# Patient Record
Sex: Male | Born: 1965
Health system: Southern US, Community
[De-identification: ages and names within clinical notes are randomized; demographics above are authoritative.]

## PROBLEM LIST (undated history)

## (undated) DIAGNOSIS — E119 Type 2 diabetes mellitus without complications: Secondary | ICD-10-CM

## (undated) DIAGNOSIS — G43909 Migraine, unspecified, not intractable, without status migrainosus: Secondary | ICD-10-CM

## (undated) DIAGNOSIS — E1142 Type 2 diabetes mellitus with diabetic polyneuropathy: Secondary | ICD-10-CM

## (undated) DIAGNOSIS — I509 Heart failure, unspecified: Secondary | ICD-10-CM

---

## 2008-01-11 ENCOUNTER — Ambulatory Visit: Payer: Self-pay | Admitting: Internal Medicine

## 2008-01-12 ENCOUNTER — Inpatient Hospital Stay (HOSPITAL_COMMUNITY): Admission: EM | Admit: 2008-01-12 | Discharge: 2008-01-14 | Payer: Self-pay | Admitting: Emergency Medicine

## 2008-01-12 ENCOUNTER — Encounter (INDEPENDENT_AMBULATORY_CARE_PROVIDER_SITE_OTHER): Payer: Self-pay | Admitting: *Deleted

## 2008-01-25 ENCOUNTER — Emergency Department (HOSPITAL_COMMUNITY): Admission: EM | Admit: 2008-01-25 | Discharge: 2008-01-25 | Payer: Self-pay | Admitting: Emergency Medicine

## 2010-11-06 NOTE — Consult Note (Signed)
NAME:  Rickey Smith, Rickey Smith NO.:  [id number]   MEDICAL RECORD NO.:  [id number]          PATIENT TYPE:  INP   LOCATION:  2626                         FACILITY:  [facility]   PHYSICIAN:  Wendi Snipes, MD DATE OF BIRTH:  1966/04/17   DATE OF CONSULTATION:  DATE OF DISCHARGE:                                 CONSULTATION   REQUESTING PHYSICIAN:  Dr. Flonnie Overman concerning pericardial effusion.   CHIEF COMPLAINT:  Chest pain and shortness of breath.   HISTORY OF PRESENT ILLNESS:  Mr. Rickey Smith is a 45 year old white male with  a past medical history significant for migraine headaches, type 1  diabetes and recently seen as an outpatient and diagnosed with pleurisy.  He presents to the ED with several days of progressive shortness of  breath and pleuritic chest pain.  The patient states that he has been in  his usual state of health prior to two weeks ago and reports feeling  some pleuritic chest pain that has been slowly progressive over the last  few weeks.  He states that he has never had chest pain like this before,  and it has progressed to restricting much of his activity.  He does  admit that he has felt febrile at times, however, no evidence of  systemic illness such as abdominal pain, nausea, vomiting or diarrhea.  The patient states that he has had a nonproductive cough during this  time as well.  In the ED,  the patient was found to have an elevated D-  dimer and was subsequently sent for a CT scan, which showed bilateral  lower lobe opacities with small effusions that were suspicious for  pneumonia and moderate to large pericardial effusion with possibly a  restrictive component.  The patient was subsequently admitted for  evaluation and management of probable pneumonia or pericardial effusion.   PAST MEDICAL HISTORY:  1. Diabetes.  2. Migraine headaches.   MEDICATIONS ON ADMISSION:  Home medications include subcu insulin.  He  is currently on:  1. Azithromycin 500  mg IV.  2. Ceftriaxone IV.  3. NovoLog 11 units subcu every 4 hours.  4. Solu-Medrol 80 mg IV q.6.  5. Tamiflu 75 mg twice daily.  6. Pantoprazole 40 mg daily.  7. Zosyn 3.375 gm IV q.8.  8. Vancomycin 1500 mg IV q.12 h.  9. Nebulizers.  10.Oxycodone 5 mg every 4 hours as needed for pain.   ALLERGIES:  NO KNOWN DRUG ALLERGIES.   SOCIAL HISTORY:  The patient is a smoker.  He has used marijuana within  the last year.  He denies any IV drug use.   FAMILY HISTORY:  Reviewed and noncontributory.   REVIEW OF SYSTEMS:  All 14 systems were reviewed and were negative  except as mentioned in the HPI.  He has had no sick contacts.   PHYSICAL EXAMINATION:  VITAL SIGNS:  His blood pressure is 101/62.  His  heart rate is 105.  He is breathing 24 times a minute.  Temperature is  97.7.  Satting 96% on 2 liters nasal cannula.  GENERAL:  He is a 45 year old white  male appearing stated age in mild  amount of acute distress.  HEENT:  Moist mucous membranes.  Pupils  equal, round and reactive to light and accommodation.  Anicteric  sclerae.  NECK:  Jugulovenous distention to the angle of the mandible.  No  thyromegaly.  CHEST:  Bilateral lower lobe crackles.  HEART:  Tachycardiac.  Distant heart sounds.  No murmurs, rubs or  gallops.  ABDOMEN:  Nontender, nondistended.  Positive bowel sounds.  EXTREMITIES:  No clubbing, cyanosis or edema.  Pulses 2+ throughout.  Some evidence of __________.  NEURO:  Cranial nerves II-XII are grossly intact.  Alert and oriented  x3.  No focal neurologic deficit.  SKIN:  Multiple tattoos on bilateral  upper extremities and his chest.  PSYCH:  Mood and affect are appropriate.   EKG shows sinus tachycardia with diffuse ST and T-wave abnormalities  with PR depression and PR elevation in AVR, suggestive of pericarditis.   LABORATORY DATA:  His troponins are undetectable.  His sed rate is 71.  D-dimer is 2.3.  His white count is 13.3, hematocrit 34, platelets 191.   Chest CT shows:  1. Bilateral lower lobe opacities, likely representing pneumonia.  2. Moderate to large pericardial effusion, possibly restrictive      component.   ASSESSMENT/PLAN:  Likely pericarditis with a pericardial effusion.  Check echocardiogram as the patient is displaying physical signs of  hemodynamic compromise.  This likely has been slowly progressive over  the past few days to weeks given the size; however, cannot be completely  accurate on the size of the effusion until a transthoracic  echocardiogram is performed.  EKG findings also support pericarditis.  In context of his other symptoms, including his pulmonary infection, it  is likely a viral pericarditis.  Recommend a non-steroidal anti-inflammatory drug regimen for pain  control, either ibuprofen 800 mg three times daily or colchicine at this  time.  The patient is currently on Solu-Medrol, and that should suffice  for the acute period.  However, he will need non-steroidal anti-  inflammatory drugs (colchicine) for the prevention of recurrent  pericarditis prior to discharge.  Will discuss with the consult team  about possible pericardiocentesis in the event that his hemodynamics  become less stable.  Otherwise, supportive care in treating possible  infection and continuing to work up for other etiologies, including as  cause for thoracic inflammation, including malignancy and other  infection.      Wendi Snipes, MD  Electronically Signed     BHH/MEDQ  D:  01/12/2008  T:  01/12/2008  Job:  [id number]

## 2010-11-06 NOTE — H&P (Signed)
NAME:  Rickey Smith, Rickey Smith NO.:  [id number]   MEDICAL RECORD NO.:  [id number]          PATIENT TYPE:  EMS   LOCATION:  MAJO                         FACILITY:  [facility]   PHYSICIAN:  Lucita Ferrara, MD         DATE OF BIRTH:  07-21-1965   DATE OF ADMISSION:  01/11/2008  DATE OF DISCHARGE:                              HISTORY & PHYSICAL   PRIMARY CARE DOCTOR:  Dr. Bayard Beaver. Spear.   CHIEF COMPLAINT:  Chest pain, shortness of breath, fevers and chills.   HISTORY OF PRESENT ILLNESS:  The patient is a 45 year old male who  presents to [facility] with a chief complaint of chest  pain.  The symptoms began 3 days ago after the patient was diagnosed  with pleuritis with his primary care doctor.  He was empirically put on  antibiotics as an outpatient with little relief.  The patient's symptoms  became progressively worse and the patient presented here.  He also has  some shortness of breath that is worse upon exertion.  In addition to  the above symptoms, he has diffuse myalgias located in his upper neck  and chest area.  He has just been feeling very sick.  Here in the  emergency room a full workup was initiated, and it was found that the  patient had bilateral pneumonia and a large pleural effusion and  possible pericarditis.  The patient is nauseated, however, he has no  vomiting.  He also denies any diarrhea.  He denies recent travel or sick  contacts.   PAST MEDICAL HISTORY:  1. Diabetes type 1.  2. Migraine headache.  3. Pleurisy which was diagnosed recently.   SOCIAL HISTORY:  Currently smoking half-pack per day.  Also uses  cannabis, but denies any alcohol.   ALLERGIES:  NO KNOWN DRUG ALLERGIES.   MEDICATIONS AT HOME:  1. Ibuprofen 800 mg as needed.  2. Vicodin 5/500 as needed.  3. Avelox 400 mg p.o. daily.  4. NovoLog four times a day.   EKG shows ST-T wave changes, consistent with pericarditis, rate 100.   LABORATORY DATA:  ESR at 71.  Cardiac  markers; a myoglobin of 47,  troponin negative, D-dimer found to be 2.38.   RADIOLOGICAL DATA:  CT angio shows bilateral lower lobe lung opacity  with small effusions, most likely representing pneumonia.  A moderate to  large pericardial effusion, possibly restrictive in component.  No  evidence of acute pulmonary embolism.  No thoracic aortic abnormalities.  Somewhat prominent mediastinal nodes.  Consider follow up.  I guess it  stated that malignancy could not be ruled out.  Chest x-ray showed no  active cardiopulmonary disease.  There is mild cardiomegaly.   ASSESSMENT/PLAN:  A 45 year old with;  1. Chest pain/shortness of breath/pleuritis.  2. Fever/chills/diffuse myalgias.  3. Bilateral pneumonia per CT angiogram.  4. Tobaccoism.  5. Large pericardial effusion.  6. Likely pericarditis.   DISCUSSION AND PLAN:  It does look like the patient likely has  pericardial involvement, although I am not entirely sure of his  restrictive component of his  pericardial effusion.  It is certainly  worrisome if this is the case.  I did speak to cardiology on-call, who  will come for further recommendations.  IV fluids to be given.  Will  initiate sepsis protocol given his bilateral pneumonia.  We will  initiate treatment with vancomycin/Zosyn/Avelox.  Panculture if not done  in the emergency room.  Will cycle his cardiac enzymes.  H1N1 droplet  precautions and protocol.  Will empirically treat him with Tamiflu and  also initiate Solu-Medrol x24 hours.  Continue with hemodynamically  monitoring and involvement of critical care if the patient undergoes  hemodynamic or ventilatory compromise.      Lucita Ferrara, MD  Electronically Signed     RR/MEDQ  D:  01/12/2008  T:  01/12/2008  Job:  [id number]

## 2010-11-06 NOTE — Discharge Summary (Signed)
NAME:  Rickey Smith, Rickey Smith NO.:  [id number]   MEDICAL RECORD NO.:  [id number]          PATIENT TYPE:  INP   LOCATION:  4711                         FACILITY:  [facility]   PHYSICIAN:  Lonia Blood, M.D.       DATE OF BIRTH:  October 14, 1965   DATE OF ADMISSION:  01/11/2008  DATE OF DISCHARGE:  01/14/2008                               DISCHARGE SUMMARY   PRIMARY CARE PHYSICIAN:  Tammy R. Collins Scotland, MD.   DISCHARGE DIAGNOSES:  1. Pleuritis and pericarditis - most likely viral - improving.  2. Diabetes mellitus type 1.  3. Migraine headaches.   DISCHARGE MEDICATIONS:  1. Ibuprofen 500 mg by mouth three times a day.  2. Protonix 40 mg daily.  3. Doxycycline 100 mg twice a day.   CONDITION ON DISCHARGE:  [name] is discharged in great condition.  He is alert and oriented with stable vital signs.  He is afebrile and  without any chest pain.  He is instructed to follow up with Dr. Herb Grays to have a transthoracic echocardiogram arranged at [facility] to assure resolution of his pericardial effusion.   PROCEDURES IN THIS ADMISSION:  1. The patient underwent CT scan of his chest with findings of      bilateral lower lobe infiltrates, pericardial effusion.  No      pulmonary emboli.  2. On January 12, 2008, transthoracic echocardiogram with findings of      small pericardial effusion without evidence of hemodynamic      compromise - read by Dr. Eden Emms   CONSULTATION IN THIS ADMISSION:  The patient was seen by Dr. Eden Emms from  Cardiology.   HISTORY AND PHYSICAL:  Refer to dictated H and P which was done by Dr.  Flonnie Overman on January 12, 2008.   HOSPITAL COURSE:  Pleuritis, pericarditis, chest pain, and pneumonia.  [name] was admitted from home with a viral-type syndrome that was  complicated by pleuritis and pericarditis.  The patient was treated with  high-dose steroids and nonsteroidal anti-inflammatory drugs, and he was  placed on empiric intravenous antibiotics.   His course was one of a  rapid recovery, and by hospital day #3, [name] reported that his  chest pain was gone.  He  requested discharge home.  The patient was transitioned home on  nonsteroidal anti-inflammatory drugs and an oral antibiotic and he was  told to follow up with his primary care physician for a repeat  echocardiogram to assure resolution of his pericardial effusion.      Lonia Blood, M.D.  Electronically Signed     SL/MEDQ  D:  01/16/2008  T:  01/16/2008  Job:  [id number]   cc:   Tammy R. Collins Scotland, M.D.

## 2011-03-22 LAB — POCT I-STAT, CHEM 8
BUN: 17
BUN: 15
Calcium, Ion: 0.94 — ABNORMAL LOW
Chloride: 108
Chloride: 107
Creatinine, Ser: 1
Glucose, Bld: 230 — ABNORMAL HIGH
Potassium: 4.2
Sodium: 139
TCO2: 21

## 2011-03-22 LAB — CK TOTAL AND CKMB (NOT AT ARMC)
CK, MB: 0.3
CK, MB: 0.5
Relative Index: INVALID
Relative Index: INVALID
Total CK: 15

## 2011-03-22 LAB — CBC
HCT: 34.8 — ABNORMAL LOW
HCT: 39
Hemoglobin: 10.8 — ABNORMAL LOW
MCHC: 33.9
MCV: 87.6
MCV: 87.7
Platelets: 173
Platelets: 191
Platelets: 262
RBC: 3.63 — ABNORMAL LOW
RDW: 12.8
WBC: 10.3
WBC: 13.3 — ABNORMAL HIGH
WBC: 13.6 — ABNORMAL HIGH
WBC: 22.6 — ABNORMAL HIGH

## 2011-03-22 LAB — CULTURE, BLOOD (ROUTINE X 2)

## 2011-03-22 LAB — URINALYSIS, ROUTINE W REFLEX MICROSCOPIC
Leukocytes, UA: NEGATIVE
Nitrite: NEGATIVE
Specific Gravity, Urine: >1.046 — ABNORMAL HIGH
pH: 5.5

## 2011-03-22 LAB — COMPREHENSIVE METABOLIC PANEL
ALT: 15
AST: 12
CO2: 23
CO2: 25
Calcium: 7.9 — ABNORMAL LOW
Calcium: 7.9 — ABNORMAL LOW
Chloride: 104
Creatinine, Ser: 0.69
Creatinine, Ser: 0.7
GFR calc Af Amer: >60
GFR calc non Af Amer: >60
GFR calc non Af Amer: >60
Glucose, Bld: 230 — ABNORMAL HIGH
Glucose, Bld: 283 — ABNORMAL HIGH
Sodium: 136
Total Bilirubin: 0.5

## 2011-03-22 LAB — DIFFERENTIAL
Basophils Absolute: 0.1
Basophils Absolute: 0
Eosinophils Absolute: 0.1
Eosinophils Relative: 1
Eosinophils Relative: 1
Lymphocytes Relative: 5 — ABNORMAL LOW
Lymphocytes Relative: 6 — ABNORMAL LOW
Lymphs Abs: 1.4
Neutro Abs: 19.5 — ABNORMAL HIGH
Neutrophils Relative %: 79 — ABNORMAL HIGH

## 2011-03-22 LAB — HEMOGLOBIN A1C
Hgb A1c MFr Bld: 9.3 — ABNORMAL HIGH
Mean Plasma Glucose: 254

## 2011-03-22 LAB — SEDIMENTATION RATE: Sed Rate: 71 — ABNORMAL HIGH

## 2011-03-22 LAB — URINE MICROSCOPIC-ADD ON

## 2011-03-22 LAB — POCT CARDIAC MARKERS
CKMB, poc: <1 — ABNORMAL LOW
CKMB, poc: <1 — ABNORMAL LOW
Myoglobin, poc: 58.8
Operator id: 277751
Troponin i, poc: <0.05
Troponin i, poc: <0.05

## 2011-03-22 LAB — URINE CULTURE: Culture: NO GROWTH

## 2011-03-22 LAB — B-NATRIURETIC PEPTIDE (CONVERTED LAB): Pro B Natriuretic peptide (BNP): 110 — ABNORMAL HIGH

## 2011-03-22 LAB — TROPONIN I: Troponin I: <0.01

## 2011-03-22 LAB — D-DIMER, QUANTITATIVE: D-Dimer, Quant: 2.38 — ABNORMAL HIGH

## 2012-05-20 ENCOUNTER — Emergency Department (HOSPITAL_BASED_OUTPATIENT_CLINIC_OR_DEPARTMENT_OTHER): Payer: No Typology Code available for payment source

## 2012-05-20 ENCOUNTER — Emergency Department (HOSPITAL_BASED_OUTPATIENT_CLINIC_OR_DEPARTMENT_OTHER)
Admission: EM | Admit: 2012-05-20 | Discharge: 2012-05-20 | Disposition: A | Discharge status: Home / Self Care | Payer: No Typology Code available for payment source | Attending: Emergency Medicine | Admitting: Emergency Medicine

## 2012-05-20 ENCOUNTER — Encounter (HOSPITAL_BASED_OUTPATIENT_CLINIC_OR_DEPARTMENT_OTHER): Payer: Self-pay | Admitting: *Deleted

## 2012-05-20 DIAGNOSIS — S161XXA Strain of muscle, fascia and tendon at neck level, initial encounter: Secondary | ICD-10-CM

## 2012-05-20 DIAGNOSIS — S139XXA Sprain of joints and ligaments of unspecified parts of neck, initial encounter: Secondary | ICD-10-CM | POA: Insufficient documentation

## 2012-05-20 DIAGNOSIS — S239XXA Sprain of unspecified parts of thorax, initial encounter: Secondary | ICD-10-CM | POA: Insufficient documentation

## 2012-05-20 DIAGNOSIS — S29019A Strain of muscle and tendon of unspecified wall of thorax, initial encounter: Secondary | ICD-10-CM

## 2012-05-20 DIAGNOSIS — Y939 Activity, unspecified: Secondary | ICD-10-CM | POA: Insufficient documentation

## 2012-05-20 DIAGNOSIS — F17200 Nicotine dependence, unspecified, uncomplicated: Secondary | ICD-10-CM | POA: Insufficient documentation

## 2012-05-20 MED ORDER — CYCLOBENZAPRINE HCL 10 MG PO TABS: 10.0000 mg | ORAL_TABLET | Freq: Three times a day (TID) | ORAL | Status: DC | PRN

## 2012-05-20 NOTE — ED Notes (Signed)
Patient ambulatory to Xray.
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
## 2012-10-11 ENCOUNTER — Encounter (HOSPITAL_BASED_OUTPATIENT_CLINIC_OR_DEPARTMENT_OTHER): Payer: Self-pay | Admitting: *Deleted

## 2012-10-11 ENCOUNTER — Emergency Department (HOSPITAL_BASED_OUTPATIENT_CLINIC_OR_DEPARTMENT_OTHER): Payer: Self-pay

## 2012-10-11 ENCOUNTER — Emergency Department (HOSPITAL_BASED_OUTPATIENT_CLINIC_OR_DEPARTMENT_OTHER)
Admission: EM | Admit: 2012-10-11 | Discharge: 2012-10-11 | Disposition: A | Discharge status: Home / Self Care | Payer: Self-pay | Attending: Emergency Medicine | Admitting: Emergency Medicine

## 2012-10-11 DIAGNOSIS — W108XXA Fall (on) (from) other stairs and steps, initial encounter: Secondary | ICD-10-CM | POA: Insufficient documentation

## 2012-10-11 DIAGNOSIS — X500XXA Overexertion from strenuous movement or load, initial encounter: Secondary | ICD-10-CM | POA: Insufficient documentation

## 2012-10-11 DIAGNOSIS — W010XXA Fall on same level from slipping, tripping and stumbling without subsequent striking against object, initial encounter: Secondary | ICD-10-CM | POA: Insufficient documentation

## 2012-10-11 DIAGNOSIS — Y9389 Activity, other specified: Secondary | ICD-10-CM | POA: Insufficient documentation

## 2012-10-11 DIAGNOSIS — F17200 Nicotine dependence, unspecified, uncomplicated: Secondary | ICD-10-CM | POA: Insufficient documentation

## 2012-10-11 DIAGNOSIS — S82899A Other fracture of unspecified lower leg, initial encounter for closed fracture: Secondary | ICD-10-CM | POA: Insufficient documentation

## 2012-10-11 DIAGNOSIS — Y9289 Other specified places as the place of occurrence of the external cause: Secondary | ICD-10-CM | POA: Insufficient documentation

## 2012-10-11 DIAGNOSIS — S82892A Other fracture of left lower leg, initial encounter for closed fracture: Secondary | ICD-10-CM

## 2012-10-11 MED ORDER — HYDROCODONE-ACETAMINOPHEN 5-325 MG PO TABS: 1.0000 | ORAL_TABLET | ORAL | Status: DC | PRN

## 2012-10-11 NOTE — ED Provider Notes (Signed)
History  This chart was scribed for Rickey Human, MD by Greggory Stallion, ED Scribe. This patient was seen in room [facility] and the patient's care was started at 3:43 PM .  CSN: [id number]  Arrival date & time 10/11/12  1445   First MD Initiated Contact with Patient 10/11/12 1543      Chief Complaint  Patient presents with  . Ankle Injury    Patient is a 47 y.o. male presenting with ankle pain. The history is provided by the patient. No language interpreter was used.  Ankle Pain Location:  Ankle Time since incident:  1 day Injury: yes   Mechanism of injury comment:  Twisted while going down steps Ankle location:  L ankle Pain details:    Quality:  Dull   Radiates to:  Does not radiate   Severity:  Moderate   Onset quality:  Gradual   Duration:  1 day   Timing:  Constant   Progression:  Worsening Chronicity:  New Dislocation: no   Foreign body present:  No foreign bodies Worsened by:  Bearing weight Associated symptoms: swelling   Associated symptoms: no fever, no muscle weakness, no numbness and no tingling     HPI Comments: Rickey Smith is a 47 y.o. male who presents to the Emergency Department complaining of moderate, non-radiating, dull gradual onset left ankle pain with associated swelling that began last night. Pt states he tripped over a step and fell down. Patient states that he was ambulatory last night with minimal pain to the left ankle, but when he woke up this morning there was increased pain, stiffness and swelling to the left ankle. He also reports difficulty bearing weight on the left ankle. Pt denies any other pain or injuries. Pt denies fever, chills, nausea, vomiting or numbness or weakness of lower extremities. He reports no pertinent past medical history. He is current some day smoker and uses alcohol.   History reviewed. No pertinent past medical history.  History reviewed. No pertinent past surgical history.  History reviewed. No pertinent  family history.  History  Substance Use Topics  . Smoking status: Current Some Day Smoker  . Smokeless tobacco: Not on file  . Alcohol Use: Yes      Review of Systems  Constitutional: Negative for fever and chills.  Gastrointestinal: Negative for nausea and vomiting.  Neurological: Negative for weakness and numbness.    Allergies  Review of patient's allergies indicates no known allergies.  Home Medications   Current Outpatient Rx  Name  Route  Sig  Dispense  Refill  . cyclobenzaprine (FLEXERIL) 10 MG tablet   Oral   Take 1 tablet (10 mg total) by mouth 3 (three) times daily as needed for muscle spasms.   30 tablet   0     Triage Vitals: BP 114/75  Pulse 90  Temp(Src) 98.6 F (37 C) (Oral)  Resp 18  Ht 6\' 1"  (1.854 m)  Wt 178 lb (80.74 kg)  BMI 23.49 kg/m2  SpO2 96%  Physical Exam  Constitutional: He is oriented to person, place, and time. He appears well-developed and well-nourished.  HENT:  Head: Normocephalic and atraumatic.  Neck: Normal range of motion. Neck supple.  Musculoskeletal:       Left ankle: He exhibits swelling. Tenderness.  Swelling over lateral malleolus in left ankle. Swelling over anterior portion of left ankle. Intact sensation and tendon function in left foot. No other injuries noted.  Neurological: He is alert and oriented to person,  place, and time.  Skin: Skin is warm and dry.    ED Course  Procedures (including critical care time)  DIAGNOSTIC STUDIES: Oxygen Saturation is 96% on RA, normal by my interpretation.    COORDINATION OF CARE: 3:54 PM-Discussed treatment plan which includes discharge with a cam walker and crutches with pt at bedside and pt agreed to plan. Also discussed with pt pain medications and importance of elevation.    Dg Ankle Complete Left  10/11/2012  *RADIOLOGY REPORT*  Clinical Data: Ankle injury, pain.  LEFT ANKLE COMPLETE - 3+ VIEW  Comparison: None.  Findings: There is a small avulsed fragment off the  medial malleolus noted on the oblique view.  On the lateral view, small bone fragment is noted along the anterior distal tibia, also likely a small avulsed fragment.  Mild diffuse soft tissue swelling.  IMPRESSION: Small avulsed fragments off the medial malleolus and anterior distal tibia.   Original Report Authenticated By: Charlett Nose, M.D.      1. Avulsion fracture of ankle, left, closed, initial encounter     I personally performed the services described in this documentation, which was scribed in my presence. The recorded information has been reviewed and is accurate.  Rickey Human, MD      Carleene Cooper III, MD 10/11/12 [phone]

## 2012-10-11 NOTE — ED Notes (Signed)
MD at bedside. 

## 2012-10-11 NOTE — ED Notes (Signed)
Applied cam walker to left foot/ankle.  Pt tolerated well.  Good CMS.
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
## 2013-01-24 ENCOUNTER — Emergency Department (HOSPITAL_COMMUNITY)
Admission: EM | Admit: 2013-01-24 | Discharge: 2013-01-24 | Disposition: A | Discharge status: Home / Self Care | Payer: Self-pay | Attending: Emergency Medicine | Admitting: Emergency Medicine

## 2013-01-24 DIAGNOSIS — F17200 Nicotine dependence, unspecified, uncomplicated: Secondary | ICD-10-CM | POA: Insufficient documentation

## 2013-01-24 DIAGNOSIS — W57XXXA Bitten or stung by nonvenomous insect and other nonvenomous arthropods, initial encounter: Secondary | ICD-10-CM | POA: Insufficient documentation

## 2013-01-24 DIAGNOSIS — S1096XA Insect bite of unspecified part of neck, initial encounter: Secondary | ICD-10-CM | POA: Insufficient documentation

## 2013-01-24 DIAGNOSIS — T7840XA Allergy, unspecified, initial encounter: Secondary | ICD-10-CM

## 2013-01-24 DIAGNOSIS — T63391A Toxic effect of venom of other spider, accidental (unintentional), initial encounter: Secondary | ICD-10-CM | POA: Insufficient documentation

## 2013-01-24 DIAGNOSIS — Y929 Unspecified place or not applicable: Secondary | ICD-10-CM | POA: Insufficient documentation

## 2013-01-24 DIAGNOSIS — Z794 Long term (current) use of insulin: Secondary | ICD-10-CM | POA: Insufficient documentation

## 2013-01-24 DIAGNOSIS — Y939 Activity, unspecified: Secondary | ICD-10-CM | POA: Insufficient documentation

## 2013-01-24 MED ORDER — EPINEPHRINE 0.3 MG/0.3ML IJ SOAJ: 0.3000 mg | Freq: Once | INTRAMUSCULAR | Status: DC | PRN

## 2013-01-24 MED ORDER — DEXAMETHASONE SODIUM PHOSPHATE 10 MG/ML IJ SOLN
10.0000 mg | Freq: Once | INTRAMUSCULAR | Status: AC
  Administered 2013-01-24: 10 mg via INTRAVENOUS
  Filled 2013-01-24: qty 1

## 2013-01-24 MED ORDER — DIPHENHYDRAMINE HCL 50 MG/ML IJ SOLN
25.0000 mg | Freq: Once | INTRAMUSCULAR | Status: AC
  Administered 2013-01-24: 25 mg via INTRAVENOUS
  Filled 2013-01-24: qty 1

## 2013-01-24 NOTE — ED Notes (Signed)
Pt states he was biten by spider on upper lip and became shob and had immediate swelling to upper lip and mouth area
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
## 2013-01-24 NOTE — ED Notes (Signed)
Family to bedside. Pt lungs clear, swelling to lip is lessened than arival

## 2013-01-24 NOTE — ED Notes (Signed)
Talking with family.  Pt lip swelling is gone"per pt mother"  Pt denies itching or shob.

## 2013-01-24 NOTE — ED Provider Notes (Signed)
Medical screening examination/treatment/procedure(s) were performed by non-physician practitioner and as supervising physician I was immediately available for consultation/collaboration.  Juliet Rude. Rubin Payor, MD 01/24/13 [phone]

## 2013-07-15 ENCOUNTER — Encounter (HOSPITAL_COMMUNITY): Payer: Self-pay | Admitting: Emergency Medicine

## 2013-07-15 ENCOUNTER — Emergency Department (HOSPITAL_COMMUNITY)
Admission: EM | Admit: 2013-07-15 | Discharge: 2013-07-15 | Disposition: A | Discharge status: Home / Self Care | Payer: Self-pay | Attending: Emergency Medicine | Admitting: Emergency Medicine

## 2013-07-15 DIAGNOSIS — Z79899 Other long term (current) drug therapy: Secondary | ICD-10-CM | POA: Insufficient documentation

## 2013-07-15 DIAGNOSIS — E119 Type 2 diabetes mellitus without complications: Secondary | ICD-10-CM | POA: Insufficient documentation

## 2013-07-15 DIAGNOSIS — R739 Hyperglycemia, unspecified: Secondary | ICD-10-CM

## 2013-07-15 DIAGNOSIS — Z794 Long term (current) use of insulin: Secondary | ICD-10-CM | POA: Insufficient documentation

## 2013-07-15 DIAGNOSIS — F17200 Nicotine dependence, unspecified, uncomplicated: Secondary | ICD-10-CM | POA: Insufficient documentation

## 2013-07-15 DIAGNOSIS — H538 Other visual disturbances: Secondary | ICD-10-CM | POA: Insufficient documentation

## 2013-07-15 DIAGNOSIS — R000 Tachycardia, unspecified: Secondary | ICD-10-CM | POA: Insufficient documentation

## 2013-07-15 LAB — CBC
HEMATOCRIT: 45.7 % (ref 39.0–52.0)
HEMOGLOBIN: 15.3 g/dL (ref 13.0–17.0)
MCH: 28.5 pg (ref 26.0–34.0)
MCHC: 33.5 g/dL (ref 30.0–36.0)
MCV: 85.3 fL (ref 78.0–100.0)
Platelets: 264 10*3/uL (ref 150–400)
RBC: 5.36 MIL/uL (ref 4.22–5.81)
RDW: 13 % (ref 11.5–15.5)
WBC: 9.4 10*3/uL (ref 4.0–10.5)

## 2013-07-15 LAB — COMPREHENSIVE METABOLIC PANEL
ALK PHOS: 91 U/L (ref 39–117)
ALT: 18 U/L (ref 0–53)
AST: 13 U/L (ref 0–37)
Albumin: 4 g/dL (ref 3.5–5.2)
BUN: 12 mg/dL (ref 6–23)
CALCIUM: 9.5 mg/dL (ref 8.4–10.5)
CO2: 28 mEq/L (ref 19–32)
Chloride: 97 mEq/L (ref 96–112)
Creatinine, Ser: 0.9 mg/dL (ref 0.50–1.35)
GFR calc non Af Amer: >90 mL/min (ref >90)
GLUCOSE: 428 mg/dL — AB (ref 70–99)
POTASSIUM: 4.7 meq/L (ref 3.7–5.3)
Sodium: 137 mEq/L (ref 137–147)
TOTAL PROTEIN: 7.9 g/dL (ref 6.0–8.3)
Total Bilirubin: 0.4 mg/dL (ref 0.3–1.2)

## 2013-07-15 LAB — ACETAMINOPHEN LEVEL: Acetaminophen (Tylenol), Serum: <15 ug/mL (ref 10–30)

## 2013-07-15 LAB — ETHANOL

## 2013-07-15 LAB — SALICYLATE LEVEL: Salicylate Lvl: <2 mg/dL — ABNORMAL LOW (ref 2.8–20.0)

## 2013-07-15 LAB — GLUCOSE, CAPILLARY
GLUCOSE-CAPILLARY: 314 mg/dL — AB (ref 70–99)
Glucose-Capillary: 396 mg/dL — ABNORMAL HIGH (ref 70–99)

## 2013-07-15 MED ORDER — SODIUM CHLORIDE 0.9 % IV BOLUS (SEPSIS)
1000.0000 mL | Freq: Once | INTRAVENOUS | Status: AC
  Administered 2013-07-15: 1000 mL via INTRAVENOUS

## 2013-07-15 NOTE — ED Notes (Signed)
Assumed care of patient Patient denies complaints or needs at this time Hyperglycemia noted--patient asymptomatic at this time Patient appears in NAD

## 2013-07-15 NOTE — Discharge Instructions (Signed)

## 2013-07-15 NOTE — ED Notes (Signed)
Pt a+ox4, presents in custody of police with c/o hyperglycemia.  Pt reports was feeling unwell, c/o "can't focus" and mildly blurry vision while driving, reports pulling over to check his blood sugar "and then i'm here".  Pt reports taking meds as prescribed today.  Pt continues to c/o feeling unable to focus as well as blurry vision "on the edges".  Pt denies all other complaints.  Skin pwd.  Speaking full/clear sentences.  Answering questions and following directions appropriately.  Neuros otherwise grossly intact.

## 2013-07-15 NOTE — ED Provider Notes (Signed)
CSN: [id number]     Arrival date & time 07/15/13  1756 History   First MD Initiated Contact with Patient 07/15/13 1903     Chief Complaint  Patient presents with  . Hyperglycemia   (Consider location/radiation/quality/duration/timing/severity/associated sxs/prior Treatment) Patient is a 48 y.o. male presenting with hyperglycemia. The history is provided by the patient.  Hyperglycemia Associated symptoms: polyuria   Associated symptoms: no abdominal pain, no chest pain, no nausea, no shortness of breath and no vomiting    patient was brought in by police. He is reportedly driving down the Road began to feel that his sugar was high. He states he pulled over an x-ray negative he was being arrested. He states that someone he was picking up and get a ride to was found to have drugs. He denies drug use. He states that he had dental work today. No fevers. No chest pain. He states he does have some mild blurred vision. He has had some urinary frequency. He denies headache.  Past Medical History  Diagnosis Date  . Diabetes mellitus without complication    History reviewed. No pertinent past surgical history. No family history on file. History  Substance Use Topics  . Smoking status: Current Some Day Smoker  . Smokeless tobacco: Not on file  . Alcohol Use: Yes    Review of Systems  Constitutional: Negative for activity change and appetite change.  Eyes: Positive for visual disturbance. Negative for pain.  Respiratory: Negative for chest tightness and shortness of breath.   Cardiovascular: Negative for chest pain and leg swelling.  Gastrointestinal: Negative for nausea, vomiting, abdominal pain and diarrhea.  Endocrine: Positive for polyuria.  Genitourinary: Negative for flank pain.  Musculoskeletal: Negative for back pain and neck stiffness.  Skin: Negative for rash.  Neurological: Negative for weakness, numbness and headaches.  Psychiatric/Behavioral: Negative for behavioral problems.     Allergies  Codeine  Home Medications   Current Outpatient Rx  Name  Route  Sig  Dispense  Refill  . insulin NPH-regular (NOVOLIN 70/30) (70-30) 100 UNIT/ML injection   Subcutaneous   Inject 35-45 Units into the skin See admin instructions. Takes 45 units in the morning, and 35 at bedtime         . EPINEPHrine (EPIPEN 2-PAK) 0.3 mg/0.3 mL SOAJ   Intramuscular   Inject 0.3 mLs (0.3 mg total) into the muscle once as needed (for severe allergic reaction). CAll 911 immediately if you have to use this medicine   1 Device   1    BP 117/75  Pulse 83  Temp(Src) 98.4 F (36.9 C) (Oral)  Resp 18  SpO2 97% Physical Exam  Nursing note and vitals reviewed. Constitutional: He is oriented to person, place, and time. He appears well-developed and well-nourished.  HENT:  Head: Normocephalic and atraumatic.  Eyes: EOM are normal. Pupils are equal, round, and reactive to light.  Neck: Normal range of motion. Neck supple.  Cardiovascular: Regular rhythm and normal heart sounds.   No murmur heard. Mild tachycardia  Pulmonary/Chest: Effort normal and breath sounds normal.  Abdominal: Soft. Bowel sounds are normal. He exhibits no distension and no mass. There is no tenderness. There is no rebound and no guarding.  Musculoskeletal: Normal range of motion. He exhibits no edema.  Neurological: He is alert and oriented to person, place, and time. No cranial nerve deficit.  Skin: Skin is warm and dry.  Psychiatric: He has a normal mood and affect.    ED Course  Procedures (including  critical care time) Labs Review Labs Reviewed  COMPREHENSIVE METABOLIC PANEL - Abnormal; Notable for the following:    Glucose, Bld 428 (*)    All other components within normal limits  SALICYLATE LEVEL - Abnormal; Notable for the following:    Salicylate Lvl <2.0 (*)    All other components within normal limits  GLUCOSE, CAPILLARY - Abnormal; Notable for the following:    Glucose-Capillary 396 (*)    All  other components within normal limits  GLUCOSE, CAPILLARY - Abnormal; Notable for the following:    Glucose-Capillary 314 (*)    All other components within normal limits  ACETAMINOPHEN LEVEL  CBC  ETHANOL   Imaging Review No results found.  EKG Interpretation   None       MDM   1. Hyperglycemia    Patient was some blurred vision and hyperglycemia. Feels better after IV fluids. He is on insulin at home and can control his sugars somewhat. He does not want further fluids here. He is feeling better. He will be discharged with the police.    Juliet Rude. Rubin Payor, MD 07/15/13 2111

## 2013-07-15 NOTE — Progress Notes (Signed)
   CARE MANAGEMENT ED NOTE 07/15/2013  [name],Rickey Smith   Account Number:  [id number]  Date Initiated:  07/15/2013  Documentation initiated by:  Radford PaxFERRERO,Makayah Pauli  Subjective/Objective Assessment:   Patient presents to Ed with hyperglycemia     Subjective/Objective Assessment Detail:   Patient with history of diabetes     Action/Plan:   Action/Plan Detail:   Anticipated DC Date:       Status Recommendation to Physician:   Result of Recommendation:    Other ED Services  Consult Working Plan    DC Planning Services  Other  PCP issues    Choice offered to / List presented to:            Status of service:  Completed, signed off  ED Comments:   ED Comments Detail:  EDCM spoke to patient at bedside.  Patient confrms he does not have a pcp or insurance.  [place]  EDCM provided patient with a list of community resources for [place] for [place] such as DSS and free clinics [place].  EDCM also provided patient with list of pcps who [place], list of discounted pharmacies and website needymeds.org for medication assistance, list of financial resources in the community sucha as local churches and salvation [employer], urban ministries, and dental assistance for patients who are uninsured.  [facility] provided patient with address and phone number to [facility] as well.    Provided patient with  211 card.  No further EDCM needs at this time.

## 2014-01-19 ENCOUNTER — Encounter (HOSPITAL_COMMUNITY): Payer: Self-pay | Admitting: Emergency Medicine

## 2014-01-19 ENCOUNTER — Emergency Department (HOSPITAL_COMMUNITY)
Admission: EM | Admit: 2014-01-19 | Discharge: 2014-01-20 | Disposition: A | Discharge status: Home / Self Care | Payer: Self-pay | Attending: Emergency Medicine | Admitting: Emergency Medicine

## 2014-01-19 DIAGNOSIS — F17200 Nicotine dependence, unspecified, uncomplicated: Secondary | ICD-10-CM | POA: Insufficient documentation

## 2014-01-19 DIAGNOSIS — Z794 Long term (current) use of insulin: Secondary | ICD-10-CM | POA: Insufficient documentation

## 2014-01-19 DIAGNOSIS — E109 Type 1 diabetes mellitus without complications: Secondary | ICD-10-CM | POA: Insufficient documentation

## 2014-01-19 DIAGNOSIS — R55 Syncope and collapse: Secondary | ICD-10-CM | POA: Insufficient documentation

## 2014-01-19 DIAGNOSIS — R000 Tachycardia, unspecified: Secondary | ICD-10-CM | POA: Insufficient documentation

## 2014-01-19 LAB — CBC WITH DIFFERENTIAL/PLATELET
BASOS ABS: 0 10*3/uL (ref 0.0–0.1)
Basophils Relative: 0 % (ref 0–1)
Eosinophils Absolute: 0.4 10*3/uL (ref 0.0–0.7)
Eosinophils Relative: 5 % (ref 0–5)
HEMATOCRIT: 43.8 % (ref 39.0–52.0)
Hemoglobin: 15.5 g/dL (ref 13.0–17.0)
Lymphocytes Relative: 18 % (ref 12–46)
Lymphs Abs: 1.6 10*3/uL (ref 0.7–4.0)
MCH: 29.6 pg (ref 26.0–34.0)
MCHC: 35.4 g/dL (ref 30.0–36.0)
MCV: 83.6 fL (ref 78.0–100.0)
Monocytes Absolute: 0.7 10*3/uL (ref 0.1–1.0)
Monocytes Relative: 8 % (ref 3–12)
NEUTROS ABS: 6.1 10*3/uL (ref 1.7–7.7)
Neutrophils Relative %: 69 % (ref 43–77)
PLATELETS: 229 10*3/uL (ref 150–400)
RBC: 5.24 MIL/uL (ref 4.22–5.81)
RDW: 12.4 % (ref 11.5–15.5)
WBC: 8.8 10*3/uL (ref 4.0–10.5)

## 2014-01-19 LAB — BASIC METABOLIC PANEL
ANION GAP: 13 (ref 5–15)
BUN: 9 mg/dL (ref 6–23)
CHLORIDE: 95 meq/L — AB (ref 96–112)
CO2: 28 meq/L (ref 19–32)
Calcium: 9.4 mg/dL (ref 8.4–10.5)
Creatinine, Ser: 0.86 mg/dL (ref 0.50–1.35)
GFR calc Af Amer: >90 mL/min (ref >90)
GFR calc non Af Amer: >90 mL/min (ref >90)
Glucose, Bld: 228 mg/dL — ABNORMAL HIGH (ref 70–99)
POTASSIUM: 4.4 meq/L (ref 3.7–5.3)
Sodium: 136 mEq/L — ABNORMAL LOW (ref 137–147)

## 2014-01-19 MED ORDER — SODIUM CHLORIDE 0.9 % IV BOLUS (SEPSIS)
1000.0000 mL | Freq: Once | INTRAVENOUS | Status: AC
  Administered 2014-01-19: 1000 mL via INTRAVENOUS

## 2014-01-19 NOTE — ED Provider Notes (Signed)
CSN: [id number]     Arrival date & time 01/19/14  1409 History  This chart was scribed for Donnetta HutchingBrian Anabia Weatherwax, MD by Roxy Cedarhandni Bhalodia, ED Scribe. This patient was seen in room APA18/APA18 and the patient's care was started at 2:51 PM.   Chief Complaint  Patient presents with  . Near Syncope   The history is provided by the patient. No language interpreter was used.    HPI Comments: Rickey Smith is a 48 y.o. Male with history of Type 1 DM, who presents to the Emergency Department complaining of near syncope that occurred today at 12 PM.  Pt reports he was outside fixing a tire when he became weak and dizzy.  Patient states he did not lose consciousness but laid down on the ground.  He states he did not have much to eat today but states he has been drinking plenty of fluids.  Patient denies chest pain or SOB.  Patient states he was initially diagnosed with Type 1 DM in 2006.  He states his sugar has been well-controlled recently.    Past Medical History  Diagnosis Date  . Diabetes mellitus without complication    History reviewed. No pertinent past surgical history. History reviewed. No pertinent family history. History  Substance Use Topics  . Smoking status: Current Every Day Smoker  . Smokeless tobacco: Not on file  . Alcohol Use: No    Review of Systems  A complete 10 system review of systems was obtained and all systems are negative except as noted in the HPI and PMH.    Allergies  Codeine  Home Medications   Prior to Admission medications   Medication Sig Start Date End Date Taking? Authorizing Provider  insulin NPH-regular (NOVOLIN 70/30) (70-30) 100 UNIT/ML injection Inject 35-45 Units into the skin See admin instructions. Takes 45 units in the morning, and 35 at bedtime   Yes Historical Provider, MD   Triage Vitals: BP 117/90  Pulse 112  Temp(Src) 97.8 F (36.6 C) (Oral)  Resp 26  Ht 6\' 1"  (1.854 m)  Wt 175 lb (79.379 kg)  BMI 23.09 kg/m2  SpO2 97% Physical Exam   Nursing note and vitals reviewed. Constitutional: He is oriented to person, place, and time. He appears well-developed and well-nourished.  HENT:  Head: Normocephalic and atraumatic.  Eyes: Conjunctivae and EOM are normal. Pupils are equal, round, and reactive to light.  Neck: Normal range of motion. Neck supple.  Cardiovascular: Regular rhythm and normal heart sounds.  Tachycardia present.   Mildly tachycardic  Pulmonary/Chest: Effort normal and breath sounds normal.  Abdominal: Soft. Bowel sounds are normal.  Musculoskeletal: Normal range of motion.  Neurological: He is alert and oriented to person, place, and time.  Skin: Skin is warm and dry. There is pallor.  Psychiatric: He has a normal mood and affect. His behavior is normal.    ED Course  Procedures (including critical care time)  DIAGNOSTIC STUDIES: Oxygen Saturation is 97% on RA, normal by my interpretation.    COORDINATION OF CARE: 2:55 PM- Discussed plans to order diagnostic lab work and EKG. Pt advised of plan for treatment and pt agrees.   Results for orders placed during the hospital encounter of 01/19/14  CBC WITH DIFFERENTIAL      Result Value Ref Range   WBC 8.8  4.0 - 10.5 K/uL   RBC 5.24  4.22 - 5.81 MIL/uL   Hemoglobin 15.5  13.0 - 17.0 g/dL   HCT 40.943.8  81.139.0 - 91.452.0 %  MCV 83.6  78.0 - 100.0 fL   MCH 29.6  26.0 - 34.0 pg   MCHC 35.4  30.0 - 36.0 g/dL   RDW 40.9  81.1 - 91.4 %   Platelets 229  150 - 400 K/uL   Neutrophils Relative % 69  43 - 77 %   Neutro Abs 6.1  1.7 - 7.7 K/uL   Lymphocytes Relative 18  12 - 46 %   Lymphs Abs 1.6  0.7 - 4.0 K/uL   Monocytes Relative 8  3 - 12 %   Monocytes Absolute 0.7  0.1 - 1.0 K/uL   Eosinophils Relative 5  0 - 5 %   Eosinophils Absolute 0.4  0.0 - 0.7 K/uL   Basophils Relative 0  0 - 1 %   Basophils Absolute 0.0  0.0 - 0.1 K/uL  BASIC METABOLIC PANEL      Result Value Ref Range   Sodium 136 (*) 137 - 147 mEq/L   Potassium 4.4  3.7 - 5.3 mEq/L   Chloride  95 (*) 96 - 112 mEq/L   CO2 28  19 - 32 mEq/L   Glucose, Bld 228 (*) 70 - 99 mg/dL   BUN 9  6 - 23 mg/dL   Creatinine, Ser 7.82  0.50 - 1.35 mg/dL   Calcium 9.4  8.4 - 95.6 mg/dL   GFR calc non Af Amer >90  >90 mL/min   GFR calc Af Amer >90  >90 mL/min   Anion gap 13  5 - 15   No results found.     EKG Interpretation   Date/Time:  Wednesday January 19 2014 14:17:05 EDT Ventricular Rate:  115 PR Interval:  124 QRS Duration: 82 QT Interval:  244 QTC Calculation: 337 R Axis:   76 Text Interpretation:  Sinus tachycardia Atrial premature complex Biatrial  enlargement Borderline T wave abnormalities Confirmed by Adriana Simas  MD, Ayline Dingus  [id number]) on 01/19/2014 7:52:47 PM      MDM   Final diagnoses:  Near syncope    Patient is alert. Normal physical exam. Vital signs are normalized. Hemoglobin stable. EKG sinus tach at 115.  Rate less than 100 at discharge. Glucose 228.  I personally performed the services described in this documentation, which was scribed in my presence. The recorded information has been reviewed and is accurate.      Donnetta Hutching, MD 01/19/14 [phone]

## 2014-01-19 NOTE — Discharge Instructions (Signed)
Near-Syncope Near-syncope (commonly known as near fainting) is sudden weakness, dizziness, or feeling like you might pass out. During an episode of near-syncope, you may also develop pale skin, have tunnel vision, or feel sick to your stomach (nauseous). Near-syncope may occur when getting up after sitting or while standing for a long time. It is caused by a sudden decrease in blood flow to the brain. This decrease can result from various causes or triggers, most of which are not serious. However, because near-syncope can sometimes be a sign of something serious, a medical evaluation is required. The specific cause is often not determined. HOME CARE INSTRUCTIONS  Monitor your condition for any changes. The following actions may help to alleviate any discomfort you are experiencing:  Have someone stay with you until you feel stable.  Lie down right away and prop your feet up if you start feeling like you might faint. Breathe deeply and steadily. Wait until all the symptoms have passed. Most of these episodes last only a few minutes. You may feel tired for several hours.   Drink enough fluids to keep your urine clear or pale yellow.   If you are taking blood pressure or heart medicine, get up slowly when seated or lying down. Take several minutes to sit and then stand. This can reduce dizziness.  Follow up with your health care provider as directed. SEEK IMMEDIATE MEDICAL CARE IF:   You have a severe headache.   You have unusual pain in the chest, abdomen, or back.   You are bleeding from the mouth or rectum, or you have black or tarry stool.   You have an irregular or very fast heartbeat.   You have repeated fainting or have seizure-like jerking during an episode.   You faint when sitting or lying down.   You have confusion.   You have difficulty walking.   You have severe weakness.   You have vision problems.  MAKE SURE YOU:   Understand these instructions.  Will  watch your condition.  Will get help right away if you are not doing well or get worse. Document Released: 06/10/2005 Document Revised: 06/15/2013 Document Reviewed: 11/13/2012 [facility] Patient Information 2015 [employer] [place]. This information is not intended to replace advice given to you by your health care provider. Make sure you discuss any questions you have with your health care provider.  Tests were good. Increase fluids. Eat regular meals. Stay out of the heat. Return if worse.

## 2014-01-19 NOTE — ED Notes (Signed)
Meal tray given 

## 2014-01-19 NOTE — ED Notes (Signed)
Pt having leg cramps last night. Was outside fixing a tire when he became really weak all over and dizzy. layed down for a minute and friend called EMS. Pt alert/oriented at this time. C/o generalized weakness and states left posterior shoulder just started hurting while in triage. Denies dizziness at this time or sob. nad at this time. Nondiaphoretic. 177cbg in route. Mm wet. Pt states "I cant focus real good".

## 2014-01-19 NOTE — ED Notes (Signed)
Receiving liter bolus hung from EMS at this time. approx left

## 2014-01-19 NOTE — ED Notes (Signed)
Meal tray ordered 

## 2014-04-26 ENCOUNTER — Encounter (HOSPITAL_COMMUNITY): Payer: Self-pay | Admitting: Emergency Medicine

## 2014-04-26 ENCOUNTER — Inpatient Hospital Stay (HOSPITAL_COMMUNITY)
Admission: EM | Admit: 2014-04-26 | Discharge: 2014-04-28 | DRG: 638 | Disposition: A | Discharge status: Home / Self Care | Payer: Self-pay | Attending: Internal Medicine | Admitting: Internal Medicine

## 2014-04-26 ENCOUNTER — Emergency Department (HOSPITAL_COMMUNITY): Payer: Self-pay

## 2014-04-26 DIAGNOSIS — Z8042 Family history of malignant neoplasm of prostate: Secondary | ICD-10-CM

## 2014-04-26 DIAGNOSIS — E1065 Type 1 diabetes mellitus with hyperglycemia: Secondary | ICD-10-CM

## 2014-04-26 DIAGNOSIS — E872 Acidosis, unspecified: Secondary | ICD-10-CM | POA: Diagnosis present

## 2014-04-26 DIAGNOSIS — E785 Hyperlipidemia, unspecified: Secondary | ICD-10-CM | POA: Diagnosis present

## 2014-04-26 DIAGNOSIS — E1040 Type 1 diabetes mellitus with diabetic neuropathy, unspecified: Secondary | ICD-10-CM | POA: Insufficient documentation

## 2014-04-26 DIAGNOSIS — E1010 Type 1 diabetes mellitus with ketoacidosis without coma: Principal | ICD-10-CM | POA: Diagnosis present

## 2014-04-26 DIAGNOSIS — E871 Hypo-osmolality and hyponatremia: Secondary | ICD-10-CM | POA: Diagnosis present

## 2014-04-26 DIAGNOSIS — R000 Tachycardia, unspecified: Secondary | ICD-10-CM | POA: Diagnosis present

## 2014-04-26 DIAGNOSIS — R079 Chest pain, unspecified: Secondary | ICD-10-CM | POA: Diagnosis present

## 2014-04-26 DIAGNOSIS — E1110 Type 2 diabetes mellitus with ketoacidosis without coma: Secondary | ICD-10-CM | POA: Diagnosis present

## 2014-04-26 DIAGNOSIS — Z87891 Personal history of nicotine dependence: Secondary | ICD-10-CM

## 2014-04-26 DIAGNOSIS — R072 Precordial pain: Secondary | ICD-10-CM

## 2014-04-26 DIAGNOSIS — IMO0002 Reserved for concepts with insufficient information to code with codable children: Secondary | ICD-10-CM | POA: Insufficient documentation

## 2014-04-26 DIAGNOSIS — Z8249 Family history of ischemic heart disease and other diseases of the circulatory system: Secondary | ICD-10-CM

## 2014-04-26 DIAGNOSIS — Z23 Encounter for immunization: Secondary | ICD-10-CM

## 2014-04-26 DIAGNOSIS — I517 Cardiomegaly: Secondary | ICD-10-CM

## 2014-04-26 DIAGNOSIS — Z794 Long term (current) use of insulin: Secondary | ICD-10-CM

## 2014-04-26 LAB — BASIC METABOLIC PANEL
ANION GAP: 14 (ref 5–15)
ANION GAP: 12 (ref 5–15)
Anion gap: 19 — ABNORMAL HIGH (ref 5–15)
Anion gap: 16 — ABNORMAL HIGH (ref 5–15)
BUN: 22 mg/dL (ref 6–23)
BUN: 16 mg/dL (ref 6–23)
BUN: 15 mg/dL (ref 6–23)
BUN: 13 mg/dL (ref 6–23)
BUN: 12 mg/dL (ref 6–23)
CALCIUM: 8.9 mg/dL (ref 8.4–10.5)
CHLORIDE: 103 meq/L (ref 96–112)
CO2: 16 mEq/L — ABNORMAL LOW (ref 19–32)
CO2: 16 mEq/L — ABNORMAL LOW (ref 19–32)
CO2: 18 mEq/L — ABNORMAL LOW (ref 19–32)
CO2: 19 mEq/L (ref 19–32)
CO2: 19 mEq/L (ref 19–32)
Calcium: 9.6 mg/dL (ref 8.4–10.5)
Calcium: 8.7 mg/dL (ref 8.4–10.5)
Calcium: 8.5 mg/dL (ref 8.4–10.5)
Calcium: 8.5 mg/dL (ref 8.4–10.5)
Chloride: 86 mEq/L — ABNORMAL LOW (ref 96–112)
Chloride: 99 mEq/L (ref 96–112)
Chloride: 101 mEq/L (ref 96–112)
Chloride: 101 mEq/L (ref 96–112)
Creatinine, Ser: 0.97 mg/dL (ref 0.50–1.35)
Creatinine, Ser: 0.76 mg/dL (ref 0.50–1.35)
Creatinine, Ser: 0.75 mg/dL (ref 0.50–1.35)
Creatinine, Ser: 0.7 mg/dL (ref 0.50–1.35)
Creatinine, Ser: 0.65 mg/dL (ref 0.50–1.35)
GFR calc Af Amer: >90 mL/min (ref >90)
GFR calc Af Amer: >90 mL/min (ref >90)
GFR calc Af Amer: >90 mL/min (ref >90)
GFR calc Af Amer: >90 mL/min (ref >90)
GFR calc non Af Amer: >90 mL/min (ref >90)
GFR calc non Af Amer: >90 mL/min (ref >90)
GLUCOSE: 716 mg/dL — AB (ref 70–99)
GLUCOSE: 291 mg/dL — AB (ref 70–99)
GLUCOSE: 233 mg/dL — AB (ref 70–99)
GLUCOSE: 191 mg/dL — AB (ref 70–99)
Glucose, Bld: 179 mg/dL — ABNORMAL HIGH (ref 70–99)
POTASSIUM: 5.5 meq/L — AB (ref 3.7–5.3)
POTASSIUM: 4.3 meq/L (ref 3.7–5.3)
Potassium: 4.3 mEq/L (ref 3.7–5.3)
Potassium: 4.3 mEq/L (ref 3.7–5.3)
Potassium: 4.6 mEq/L (ref 3.7–5.3)
SODIUM: 126 meq/L — AB (ref 137–147)
SODIUM: 134 meq/L — AB (ref 137–147)
SODIUM: 134 meq/L — AB (ref 137–147)
Sodium: 135 mEq/L — ABNORMAL LOW (ref 137–147)
Sodium: 134 mEq/L — ABNORMAL LOW (ref 137–147)

## 2014-04-26 LAB — GLUCOSE, CAPILLARY
GLUCOSE-CAPILLARY: 328 mg/dL — AB (ref 70–99)
GLUCOSE-CAPILLARY: 275 mg/dL — AB (ref 70–99)
GLUCOSE-CAPILLARY: 203 mg/dL — AB (ref 70–99)
GLUCOSE-CAPILLARY: 177 mg/dL — AB (ref 70–99)
Glucose-Capillary: 157 mg/dL — ABNORMAL HIGH (ref 70–99)
Glucose-Capillary: 121 mg/dL — ABNORMAL HIGH (ref 70–99)
Glucose-Capillary: 223 mg/dL — ABNORMAL HIGH (ref 70–99)
Glucose-Capillary: 138 mg/dL — ABNORMAL HIGH (ref 70–99)
Glucose-Capillary: 98 mg/dL (ref 70–99)

## 2014-04-26 LAB — CBC WITH DIFFERENTIAL/PLATELET
BASOS ABS: 0.1 10*3/uL (ref 0.0–0.1)
Basophils Relative: 1 % (ref 0–1)
Eosinophils Absolute: 0.3 10*3/uL (ref 0.0–0.7)
Eosinophils Relative: 3 % (ref 0–5)
HEMATOCRIT: 45.5 % (ref 39.0–52.0)
HEMOGLOBIN: 16.1 g/dL (ref 13.0–17.0)
Lymphocytes Relative: 13 % (ref 12–46)
Lymphs Abs: 1.1 10*3/uL (ref 0.7–4.0)
MCH: 29.9 pg (ref 26.0–34.0)
MCHC: 35.4 g/dL (ref 30.0–36.0)
MCV: 84.4 fL (ref 78.0–100.0)
MONO ABS: 0.7 10*3/uL (ref 0.1–1.0)
MONOS PCT: 8 % (ref 3–12)
NEUTROS ABS: 6.5 10*3/uL (ref 1.7–7.7)
NEUTROS PCT: 75 % (ref 43–77)
Platelets: ADEQUATE 10*3/uL (ref 150–400)
RBC: 5.39 MIL/uL (ref 4.22–5.81)
RDW: 12.2 % (ref 11.5–15.5)
Smear Review: ADEQUATE
WBC: 8.7 10*3/uL (ref 4.0–10.5)

## 2014-04-26 LAB — TROPONIN I: Troponin I: <0.3 ng/mL (ref <0.30)

## 2014-04-26 LAB — C-REACTIVE PROTEIN: CRP: <0.5 mg/dL — ABNORMAL LOW (ref <0.60)

## 2014-04-26 LAB — CBG MONITORING, ED
GLUCOSE-CAPILLARY: 539 mg/dL — AB (ref 70–99)
Glucose-Capillary: 401 mg/dL — ABNORMAL HIGH (ref 70–99)

## 2014-04-26 LAB — MRSA PCR SCREENING: MRSA by PCR: NEGATIVE

## 2014-04-26 MED ORDER — SODIUM CHLORIDE 0.9 % IV SOLN
INTRAVENOUS | Status: DC
  Administered 2014-04-26: 13:00:00 via INTRAVENOUS

## 2014-04-26 MED ORDER — DEXTROSE-NACL 5-0.45 % IV SOLN
INTRAVENOUS | Status: DC
  Administered 2014-04-26: 16:00:00 via INTRAVENOUS

## 2014-04-26 MED ORDER — SODIUM CHLORIDE 0.9 % IV SOLN: INTRAVENOUS | Status: DC

## 2014-04-26 MED ORDER — PNEUMOCOCCAL VAC POLYVALENT 25 MCG/0.5ML IJ INJ
0.5000 mL | INJECTION | INTRAMUSCULAR | Status: AC
  Administered 2014-04-27: 0.5 mL via INTRAMUSCULAR
  Filled 2014-04-26: qty 0.5

## 2014-04-26 MED ORDER — DEXTROSE 50 % IV SOLN: 25.0000 mL | INTRAVENOUS | Status: DC | PRN

## 2014-04-26 MED ORDER — HEPARIN SODIUM (PORCINE) 5000 UNIT/ML IJ SOLN
5000.0000 [IU] | Freq: Three times a day (TID) | INTRAMUSCULAR | Status: DC
  Administered 2014-04-26 – 2014-04-28 (×5): 5000 [IU] via SUBCUTANEOUS
  Filled 2014-04-26 (×5): qty 1

## 2014-04-26 MED ORDER — INFLUENZA VAC SPLIT QUAD 0.5 ML IM SUSY
0.5000 mL | PREFILLED_SYRINGE | INTRAMUSCULAR | Status: AC
  Administered 2014-04-27: 0.5 mL via INTRAMUSCULAR
  Filled 2014-04-26: qty 0.5

## 2014-04-26 MED ORDER — SODIUM CHLORIDE 0.9 % IV BOLUS (SEPSIS)
1000.0000 mL | Freq: Once | INTRAVENOUS | Status: AC
  Administered 2014-04-26: 1000 mL via INTRAVENOUS

## 2014-04-26 MED ORDER — COLCHICINE 0.6 MG PO TABS
0.6000 mg | ORAL_TABLET | Freq: Two times a day (BID) | ORAL | Status: DC
  Administered 2014-04-26 – 2014-04-27 (×3): 0.6 mg via ORAL
  Filled 2014-04-26 (×3): qty 1

## 2014-04-26 MED ORDER — SODIUM CHLORIDE 0.9 % IV SOLN: INTRAVENOUS | Status: AC

## 2014-04-26 MED ORDER — SODIUM CHLORIDE 0.9 % IV SOLN
INTRAVENOUS | Status: DC
  Administered 2014-04-26: 4.8 [IU]/h via INTRAVENOUS
  Filled 2014-04-26: qty 2.5

## 2014-04-26 MED ORDER — ASPIRIN 325 MG PO TABS
650.0000 mg | ORAL_TABLET | Freq: Three times a day (TID) | ORAL | Status: DC
  Administered 2014-04-26 – 2014-04-27 (×3): 650 mg via ORAL
  Filled 2014-04-26 (×3): qty 2

## 2014-04-26 MED ORDER — SODIUM CHLORIDE 0.9 % IV SOLN
Freq: Once | INTRAVENOUS | Status: AC
  Administered 2014-04-26: 10:00:00 via INTRAVENOUS

## 2014-04-26 NOTE — Progress Notes (Signed)
  Echocardiogram 2D Echocardiogram has been performed.  Rickey Smith 04/26/2014, 2:33 PM

## 2014-04-26 NOTE — ED Notes (Signed)
CRITICAL VALUE ALERT  Critical value received:  Glucose - 716  Date of notification:  04/26/2014  Time of notification:  1020  Critical value read back: yes  Nurse who received alert:  LJS  MD notified (1st page):  Dr Adriana Simasook  Time of first page:  1020  MD notified (2nd page):  Time of second page:  Responding MD:  Dr Adriana Simasook  Time MD responded:  1021

## 2014-04-26 NOTE — H&P (Signed)
[facility] History and Physical  ZOHAN SHIFLET ZPH:[id number] DOB: 01/13/1966 DOA: 04/26/2014  Referring physician: ED PCP: No PCP Per Patient  Specialists: none  Chief Complaint: CP, Polyuria  HPI:  48 y/o ? Known diabetic since 2007 [had DKA that time], started having CP this am-unclear what time.  It woke him up.  Continuous 5/10 not relieved by anything.  Non smoker currently quit 1ppd a couple of years ago-smoked overall for maybe 30 years.  Decided to come to Ed for this pain as was worried about it. Pain stays in the center of his chest-seems worse with taking a deep breath--this seems simlar to the symtpoms he experiences with his pericarditis pain some time ago  [~2012] Has been fine in the past couple of days-deneis n/v cough or cold, no burning in the urine, no hemetemesis or melena,  No ill contacts no other sources. Hasn't had flu shot and never does Has been eating and drinking as usual Usual sugars at home ar ein the 100 range per patient  States he took his insulin yesterday   Workup in the emergency room sodium 126 potassium 55 CO2 16 2013 troponin 0.30 WBC 8 Glucose initially 716 Chest x-ray no active disease EKG showed sinus tachycardia rates in the 1 20s range PR interval 0.12 QRS axis 90 scalloping of the 3 V4 and 5 likely related to rate. No acute change from prior EKG done 12/2013   Review of Systems: The patient  Fever Chills Cough Cold Recentill contacts Smoking Illicit drug use Diarrhea Positive for polyuria, blurred vision  Past Medical History  Diagnosis Date  . Diabetes mellitus without complication    History reviewed. No pertinent past surgical history. Social History:  History   Social History Narrative   Patient currently unemplyoed   Used to be a English as a second language teacher   He is from [place] he rworked wa sin Programmer, systems to school until high school and graduated.    Allergies  Allergen Reactions  . Codeine Itching     Family History  Problem Relation Age of Onset  . Heart attack    . Prostate cancer    . Hypertension       Prior to Admission medications   Medication Sig Start Date End Date Taking? Authorizing Provider  insulin NPH-regular (NOVOLIN 70/30) (70-30) 100 UNIT/ML injection Inject 35-45 Units into the skin See admin instructions. Takes 45 units in the morning, and 35 at bedtime   Yes Historical Provider, MD   Physical Exam: Filed Vitals:   04/26/14 0945 04/26/14 1000 04/26/14 1030 04/26/14 1100  BP: 107/65 136/94 144/90 147/87  Pulse: 72 102 94 96  Resp: _0 Height:      Weight:      SpO2: 99% 97% 100% 99%     General:  arousable but sleepy  Eyes: EOMI NCAT no pallor no icterus.  ENT: multiple piercings left air, lower lip. Mucosa dry  Neck: soft supple  Cardiovascular: S1-S2 tachycardic regular rate rhythm no murmur  Respiratory: clinically clear no added sound  Abdomen: soft nontender nondistended but a little bit of guarding in the lower quadrant  Skin: no lower extremity edema with multiple tattoos left lower extremities  Musculoskeletal: range of motion intact  Psychiatric: euthymic  Neurologic: moving all 4 limbs equally power 5/5  Labs on Admission:  Basic Metabolic Panel:  Recent Labs Lab 04/26/14 0945  NA 126*  K 5.5*  CL 86*  CO2 16*  GLUCOSE 716*  BUN 22  CREATININE 0.97  CALCIUM 9.6   Liver Function Tests: No results for input(s): AST, ALT, ALKPHOS, BILITOT, PROT, ALBUMIN in the last 168 hours. No results for input(s): LIPASE, AMYLASE in the last 168 hours. No results for input(s): AMMONIA in the last 168 hours. CBC:  Recent Labs Lab 04/26/14 0945  WBC 8.7  NEUTROABS 6.5  HGB 16.1  HCT 45.5  MCV 84.4  PLT PLATELET CLUMPS NOTED ON SMEAR, COUNT APPEARS ADEQUATE   Cardiac Enzymes:  Recent Labs Lab 04/26/14 0945  TROPONINI <0.30    BNP (last 3 results) No results for input(s): PROBNP in the last 8760  hours. CBG:  Recent Labs Lab 04/26/14 0938 04/26/14 1110  GLUCAP >600* 539*    Radiological Exams on Admission: Dg Chest Portable 1 View  04/26/2014   CLINICAL DATA:  Mid chest pain.  EXAM: PORTABLE CHEST - 1 VIEW  COMPARISON:  01/25/2008  FINDINGS: Normal heart size and mediastinal contours. No acute infiltrate or edema. No effusion or pneumothorax. Remote left clavicle fracture. No acute osseous findings.  IMPRESSION: No active disease.   Electronically Signed   By: Jorje Guild M.D.   On: 04/26/2014 10:24    EKG: Independently reviewed. See above  Assessment/Plan Active Problems:   DKA (diabetic ketoacidoses)-moderat to severe initial blood sugar 716, anion gap 24, slightly somnolent but this may be secondary to poor sleep overnight and polyuria. Admitted to step down unit placed on Glucomander expect anion gap of those. Be met every 4 hourly Some concern either toxic ingestion or drugs but I'm not completely convinced and it would not change my management at this stage. Monitorand potentially start regular diet if gap closes quickly   Chest pain, ROMI, DDX pericarditis-EKG suggestive of pericarditis given his tachycardia. Other alternative possibilities are less likely. It appears that his tachycardia is somewhat chronic and this may need to be followed in the outpatient setting. At this stage we will rule him out for an MI with troponins and repeat EKG in the morning.  I would obtain an echo is also present he had a pericardial effusion in the past. He currently is not hypoxic and his chest pain is somewhat benign sounding so I do not feel we need to ask cardiology opinion currently but this certainly can be arranged if needed in the morning He will be started on high-dose aspirin 650 3 times a day in addition to colchicine 0.5 twice a day. Ex-smoker-stable.   Hyponatremia-dilutional state secondary to DKA and hyperglycemia. Should correct.  His EKG does not show significant T-wave  elevations in he does not need Kayexalate at this time   Metabolic acidosis-secondary to DKA 45 minutes Full CODE STATUS Inpatient stepdown   [place] [place] [facility] Pager [phone]  If 7PM-7AM, please contact night-coverage [email or web address] Password [facility] 04/26/2014, 11:34 AM

## 2014-04-26 NOTE — ED Notes (Signed)
Pt c/o cp since last night worse x 2 hours with n/dizziness/back pain/weakness.

## 2014-04-26 NOTE — ED Notes (Signed)
BGL checked.  Meter stated "high".  Greg R.N. Notified

## 2014-04-26 NOTE — ED Provider Notes (Signed)
CSN: [id number]     Arrival date & time 04/26/14  0930 History  This chart was scribed for Donnetta HutchingBrian Caylee Vlachos, MD by Annye AsaAnna Dorsett, ED Scribe. This patient was seen in room APA06/APA06 and the patient's care was started at 9:51 AM.    Chief Complaint  Patient presents with  . Chest Pain   The history is provided by the patient. No language interpreter was used.     HPI Comments:Level 5 caveat for urgent need for intervention. Dolores HooseWilliam S Kitson is a 48 y.o. male with past medical history of CHF who presents to the Emergency Department complaining of worsening chest pain, described as an ache with sharp pains on breathing, beginning last night. Patient reports associated SOB. He reports generalized weakness and lightheadedness with ambulation. He is unsure of diaphoresis but feels "cold." He manages his DM (diagnosed in 2007) with insulin; he has not taken any this morning.   He does not have a PCP. He is a nonsmoker and does not use EtOH.   He reports a family history of CHF (father). He is unsure of a family history of MI.   Past Medical History  Diagnosis Date  . Diabetes mellitus without complication    History reviewed. No pertinent past surgical history. Family History  Problem Relation Age of Onset  . Heart attack    . Prostate cancer    . Hypertension     History  Substance Use Topics  . Smoking status: Former Games developermoker  . Smokeless tobacco: Not on file  . Alcohol Use: No    Review of Systems  Unable to perform ROS: Acuity of condition    A complete 10 system review of systems was obtained and all systems are negative except as noted in the HPI and PMH.   Allergies  Codeine  Home Medications   Prior to Admission medications   Medication Sig Start Date End Date Taking? Authorizing Provider  insulin NPH-regular (NOVOLIN 70/30) (70-30) 100 UNIT/ML injection Inject 35-45 Units into the skin See admin instructions. Takes 45 units in the morning, and 35 at bedtime   Yes Historical  Provider, MD  aspirin EC 81 MG EC tablet Take 1 tablet (81 mg total) by mouth daily. 04/28/14   Erick BlinksJehanzeb Memon, MD  pravastatin (PRAVACHOL) 40 MG tablet Take 1 tablet (40 mg total) by mouth daily. 04/28/14   Erick BlinksJehanzeb Memon, MD   BP 105/67 mmHg  Pulse 94  Temp(Src) 98.3 F (36.8 C) (Oral)  Resp 18  Ht 6\' 1"  (1.854 m)  Wt 147 lb 11.3 oz (67 kg)  BMI 19.49 kg/m2  SpO2 99% Physical Exam  Constitutional: He is oriented to person, place, and time. He appears well-developed and well-nourished.  Thin, pale and dehydrated  HENT:  Head: Normocephalic and atraumatic.  Eyes: Conjunctivae and EOM are normal. Pupils are equal, round, and reactive to light.  Neck: Normal range of motion. Neck supple.  Cardiovascular: Normal rate, regular rhythm and normal heart sounds.   Pulmonary/Chest: Effort normal and breath sounds normal.  Anterior chest pain  Abdominal: Soft. Bowel sounds are normal.  Musculoskeletal: Normal range of motion.  Neurological: He is alert and oriented to person, place, and time.  Skin: Skin is warm and dry.  Psychiatric: He has a normal mood and affect. His behavior is normal.  Nursing note and vitals reviewed.   ED Course  Procedures   DIAGNOSTIC STUDIES: Oxygen Saturation is 99% on RA, normal by my interpretation.    COORDINATION OF CARE:  9:54 AM Discussed treatment plan with pt at bedside, including IV fluids, insulin, further testing (CXR, troponin), and pt agreed to plan.   Labs Review Labs Reviewed  BASIC METABOLIC PANEL - Abnormal; Notable for the following:    Sodium 126 (*)    Potassium 5.5 (*)    Chloride 86 (*)    CO2 16 (*)    Glucose, Bld 716 (*)    All other components within normal limits  BASIC METABOLIC PANEL - Abnormal; Notable for the following:    Sodium 134 (*)    CO2 16 (*)    Glucose, Bld 291 (*)    Anion gap 19 (*)    All other components within normal limits  BASIC METABOLIC PANEL - Abnormal; Notable for the following:    Sodium 135  (*)    CO2 18 (*)    Glucose, Bld 233 (*)    Anion gap 16 (*)    All other components within normal limits  BASIC METABOLIC PANEL - Abnormal; Notable for the following:    Sodium 134 (*)    Glucose, Bld 191 (*)    All other components within normal limits  BASIC METABOLIC PANEL - Abnormal; Notable for the following:    Sodium 134 (*)    Glucose, Bld 179 (*)    All other components within normal limits  GLUCOSE, CAPILLARY - Abnormal; Notable for the following:    Glucose-Capillary 328 (*)    All other components within normal limits  GLUCOSE, CAPILLARY - Abnormal; Notable for the following:    Glucose-Capillary 275 (*)    All other components within normal limits  GLUCOSE, CAPILLARY - Abnormal; Notable for the following:    Glucose-Capillary 157 (*)    All other components within normal limits  C-REACTIVE PROTEIN - Abnormal; Notable for the following:    CRP <0.5 (*)    All other components within normal limits  GLUCOSE, CAPILLARY - Abnormal; Notable for the following:    Glucose-Capillary 121 (*)    All other components within normal limits  GLUCOSE, CAPILLARY - Abnormal; Notable for the following:    Glucose-Capillary 203 (*)    All other components within normal limits  GLUCOSE, CAPILLARY - Abnormal; Notable for the following:    Glucose-Capillary 177 (*)    All other components within normal limits  GLUCOSE, CAPILLARY - Abnormal; Notable for the following:    Glucose-Capillary 223 (*)    All other components within normal limits  GLUCOSE, CAPILLARY - Abnormal; Notable for the following:    Glucose-Capillary 138 (*)    All other components within normal limits  GLUCOSE, CAPILLARY - Abnormal; Notable for the following:    Glucose-Capillary 116 (*)    All other components within normal limits  GLUCOSE, CAPILLARY - Abnormal; Notable for the following:    Glucose-Capillary 145 (*)    All other components within normal limits  GLUCOSE, CAPILLARY - Abnormal; Notable for the  following:    Glucose-Capillary 235 (*)    All other components within normal limits  GLUCOSE, CAPILLARY - Abnormal; Notable for the following:    Glucose-Capillary 354 (*)    All other components within normal limits  GLUCOSE, CAPILLARY - Abnormal; Notable for the following:    Glucose-Capillary 260 (*)    All other components within normal limits  LIPID PANEL - Abnormal; Notable for the following:    Triglycerides 462 (*)    HDL 33 (*)    All other components within normal limits  HEMOGLOBIN A1C - Abnormal; Notable for the following:    Hgb A1c MFr Bld 12.5 (*)    Mean Plasma Glucose 312 (*)    All other components within normal limits  BASIC METABOLIC PANEL - Abnormal; Notable for the following:    Potassium 3.2 (*)    Glucose, Bld 253 (*)    Calcium 8.3 (*)    All other components within normal limits  GLUCOSE, CAPILLARY - Abnormal; Notable for the following:    Glucose-Capillary 307 (*)    All other components within normal limits  GLUCOSE, CAPILLARY - Abnormal; Notable for the following:    Glucose-Capillary 281 (*)    All other components within normal limits  HEPATIC FUNCTION PANEL - Abnormal; Notable for the following:    Total Protein 5.8 (*)    Albumin 3.1 (*)    Total Bilirubin <0.2 (*)    All other components within normal limits  GLUCOSE, CAPILLARY - Abnormal; Notable for the following:    Glucose-Capillary 189 (*)    All other components within normal limits  CBG MONITORING, ED - Abnormal; Notable for the following:    Glucose-Capillary >600 (*)    All other components within normal limits  CBG MONITORING, ED - Abnormal; Notable for the following:    Glucose-Capillary 539 (*)    All other components within normal limits  CBG MONITORING, ED - Abnormal; Notable for the following:    Glucose-Capillary 401 (*)    All other components within normal limits  MRSA PCR SCREENING  CBC WITH DIFFERENTIAL  TROPONIN I  GLUCOSE, CAPILLARY  GLUCOSE, CAPILLARY   D-DIMER, QUANTITATIVE  TROPONIN I  GLUCOSE, CAPILLARY    Imaging Review No results found.   EKG Interpretation   Date/Time:  Tuesday April 26 2014 09:40:31 EST Ventricular Rate:  113 PR Interval:  134 QRS Duration: 81 QT Interval:  308 QTC Calculation: 422 R Axis:   81 Text Interpretation:  Sinus tachycardia Multiple premature complexes, vent   Aberrant complex Biatrial enlargement Baseline wander in lead(s) V2  Confirmed by Windy Dudek  MD, Minyon Billiter ([id number]) on 04/26/2014 9:48:29 AM     CRITICAL CARE Performed by: Donnetta HutchingOOK,Adaline Trejos Total critical care time: 30 Critical care time was exclusive of separately billable procedures and treating other patients. Critical care was necessary to treat or prevent imminent or life-threatening deterioration. Critical care was time spent personally by me on the following activities: development of treatment plan with patient and/or surrogate as well as nursing, discussions with consultants, evaluation of patient's response to treatment, examination of patient, obtaining history from patient or surrogate, ordering and performing treatments and interventions, ordering and review of laboratory studies, ordering and review of radiographic studies, pulse oximetry and re-evaluation of patient's condition. MDM   Final diagnoses:  Diabetic ketoacidosis without coma associated with type 1 diabetes mellitus  patient is in DKA. Vigorous IV hydration. Glucose stabilizer initiated with insulin drip. Admit to stepdown.  I personally performed the services described in this documentation, which was scribed in my presence. The recorded information has been reviewed and is accurate.      Donnetta HutchingBrian Taylen Wendland, MD 05/03/14 [phone]

## 2014-04-26 NOTE — Plan of Care (Signed)
Problem: Phase I Progression Outcomes Goal: CBGs steadily decreasing on IV insulin drip Outcome: Completed/Met Date Met:  04/26/14 Goal: Initial discharge plan identified Outcome: Completed/Met Date Met:  04/26/14

## 2014-04-27 DIAGNOSIS — R0789 Other chest pain: Secondary | ICD-10-CM

## 2014-04-27 DIAGNOSIS — E871 Hypo-osmolality and hyponatremia: Secondary | ICD-10-CM

## 2014-04-27 DIAGNOSIS — E1010 Type 1 diabetes mellitus with ketoacidosis without coma: Principal | ICD-10-CM

## 2014-04-27 DIAGNOSIS — R071 Chest pain on breathing: Secondary | ICD-10-CM

## 2014-04-27 LAB — GLUCOSE, CAPILLARY
GLUCOSE-CAPILLARY: 116 mg/dL — AB (ref 70–99)
GLUCOSE-CAPILLARY: 354 mg/dL — AB (ref 70–99)
GLUCOSE-CAPILLARY: 260 mg/dL — AB (ref 70–99)
Glucose-Capillary: 145 mg/dL — ABNORMAL HIGH (ref 70–99)
Glucose-Capillary: 235 mg/dL — ABNORMAL HIGH (ref 70–99)
Glucose-Capillary: 95 mg/dL (ref 70–99)

## 2014-04-27 LAB — TROPONIN I: Troponin I: <0.3 ng/mL (ref <0.30)

## 2014-04-27 LAB — D-DIMER, QUANTITATIVE (NOT AT ARMC)

## 2014-04-27 MED ORDER — INSULIN ASPART PROT & ASPART (70-30 MIX) 100 UNIT/ML ~~LOC~~ SUSP
20.0000 [IU] | SUBCUTANEOUS | Status: AC
  Administered 2014-04-27: 20 [IU] via SUBCUTANEOUS
  Filled 2014-04-27: qty 10

## 2014-04-27 MED ORDER — INSULIN ASPART PROT & ASPART (70-30 MIX) 100 UNIT/ML ~~LOC~~ SUSP
45.0000 [IU] | Freq: Every day | SUBCUTANEOUS | Status: DC
  Administered 2014-04-28: 45 [IU] via SUBCUTANEOUS
  Filled 2014-04-27: qty 10

## 2014-04-27 MED ORDER — ASPIRIN EC 81 MG PO TBEC
81.0000 mg | DELAYED_RELEASE_TABLET | Freq: Every day | ORAL | Status: DC
  Administered 2014-04-28: 81 mg via ORAL
  Filled 2014-04-27: qty 1

## 2014-04-27 MED ORDER — INSULIN GLARGINE 100 UNIT/ML ~~LOC~~ SOLN
10.0000 [IU] | Freq: Once | SUBCUTANEOUS | Status: AC
  Administered 2014-04-27: 10 [IU] via SUBCUTANEOUS
  Filled 2014-04-27: qty 0.1

## 2014-04-27 MED ORDER — INSULIN ASPART PROT & ASPART (70-30 MIX) 100 UNIT/ML ~~LOC~~ SUSP
35.0000 [IU] | Freq: Every day | SUBCUTANEOUS | Status: DC
  Administered 2014-04-27: 35 [IU] via SUBCUTANEOUS
  Filled 2014-04-27: qty 10

## 2014-04-27 MED ORDER — INSULIN ASPART 100 UNIT/ML ~~LOC~~ SOLN
0.0000 [IU] | Freq: Three times a day (TID) | SUBCUTANEOUS | Status: DC
  Administered 2014-04-27: 15 [IU] via SUBCUTANEOUS
  Administered 2014-04-27: 8 [IU] via SUBCUTANEOUS
  Administered 2014-04-27: 5 [IU] via SUBCUTANEOUS
  Administered 2014-04-28: 8 [IU] via SUBCUTANEOUS
  Administered 2014-04-28: 3 [IU] via SUBCUTANEOUS

## 2014-04-27 MED ORDER — ASPIRIN 325 MG PO TABS: 325.0000 mg | ORAL_TABLET | Freq: Every day | ORAL | Status: DC

## 2014-04-27 MED ORDER — INSULIN ASPART PROT & ASPART (70-30 MIX) 100 UNIT/ML ~~LOC~~ SUSP: 35.0000 [IU] | Freq: Two times a day (BID) | SUBCUTANEOUS | Status: DC

## 2014-04-27 NOTE — Progress Notes (Signed)
[facility] PROGRESS NOTE  Rickey HooseWilliam S Smith MVH:[id number] DOB: 10/24/1965 DOA: 04/26/2014 PCP: No PCP Per Patient  Assessment/Plan: 1. Diabetic ketoacidosis. Anion gap appears to have closed. He has been transitioned to subcutaneous insulin. Restart 70/30 insulin which he takes at home. Continue to monitor blood sugars. 2. Chest pain, appears pleuritic. Patient reports improvement in his symptoms.EKG appeared to be nonacute. Initial cardiac marker was found to be negative. we'll repeat cardiac enzymes at this time. We'll also check d-dimer. CRP was found to be negative. It appears that a diagnosis of pericarditis may be less likely. Discontinue colchicine and change aspirin to once daily. The patient does have significant risk factors for heart disease. He does not report having a stress test in the past. Will request cardiology input to see if further ischemic testing is needed. Echocardiogram has been ordered. 3. Type 1 diabetes. Continue sliding scale insulin and 70/30 insulin 4. Pseudohyponatremia. improved with correction of blood sugars.  Code Status: full code Family Communication: discussed with patient  Disposition Plan: discharge home once improved   Consultants:  cardiology  Procedures:    Antibiotics:    HPI/Subjective: Patient describes central chest pain that is non radiating, worse with deep breathing, non reproducible and associated with shortness of breath. Pain is worse with walking. Reports having similar pain when he had an episode of CHF. Reports having some loose stools this morning.   Objective: Filed Vitals:   04/27/14 0838  BP:   Pulse:   Temp: 97.1 F (36.2 C)  Resp:     Intake/Output Summary (Last 24 hours) at 04/27/14 1118 Last data filed at 04/27/14 0100  Gross per 24 hour  Intake 2173.35 ml  Output   1825 ml  Net 348.35 ml   Filed Weights   04/26/14 0935 04/26/14 1404 04/27/14 0500  Weight: 77.111 kg (170 lb) 65.5 kg (144 lb 6.4 oz)  67 kg (147 lb 11.3 oz)    Exam:   General:  NAD  Cardiovascular: s1, s2 rrr  Respiratory: cta b  Abdomen: soft, nt, nd, bs+  Musculoskeletal: no edema b/l  Data Reviewed: Basic Metabolic Panel:  Recent Labs Lab 04/26/14 0945 04/26/14 1353 04/26/14 1449 04/26/14 1716 04/26/14 1859  NA 126* 134* 135* 134* 134*  K 5.5* 4.3 4.3 4.6 4.3  CL 86* 99 101 101 103  CO2 16* 16* 18* 19 19  GLUCOSE 716* 291* 233* 191* 179*  BUN 22 16 15 13 12   CREATININE 0.97 0.76 0.75 0.70 0.65  CALCIUM 9.6 8.7 8.9 8.5 8.5   Liver Function Tests: No results for input(s): AST, ALT, ALKPHOS, BILITOT, PROT, ALBUMIN in the last 168 hours. No results for input(s): LIPASE, AMYLASE in the last 168 hours. No results for input(s): AMMONIA in the last 168 hours. CBC:  Recent Labs Lab 04/26/14 0945  WBC 8.7  NEUTROABS 6.5  HGB 16.1  HCT 45.5  MCV 84.4  PLT PLATELET CLUMPS NOTED ON SMEAR, COUNT APPEARS ADEQUATE   Cardiac Enzymes:  Recent Labs Lab 04/26/14 0945  TROPONINI <0.30   BNP (last 3 results) No results for input(s): PROBNP in the last 8760 hours. CBG:  Recent Labs Lab 04/26/14 2250 04/27/14 0006 04/27/14 0116 04/27/14 0226 04/27/14 0726  GLUCAP 98 95 116* 145* 235*    Recent Results (from the past 240 hour(s))  MRSA PCR Screening     Status: None   Collection Time: 04/26/14  1:30 PM  Result Value Ref Range Status   MRSA by PCR NEGATIVE  NEGATIVE Final    Comment:        The GeneXpert MRSA Assay (FDA approved for NASAL specimens only), is one component of a comprehensive MRSA colonization surveillance program. It is not intended to diagnose MRSA infection nor to guide or monitor treatment for MRSA infections.      Studies: Dg Chest Portable 1 View  04/26/2014   CLINICAL DATA:  Mid chest pain.  EXAM: PORTABLE CHEST - 1 VIEW  COMPARISON:  01/25/2008  FINDINGS: Normal heart size and mediastinal contours. No acute infiltrate or edema. No effusion or pneumothorax.  Remote left clavicle fracture. No acute osseous findings.  IMPRESSION: No active disease.   Electronically Signed   By: Tiburcio PeaJonathan  Watts M.D.   On: 04/26/2014 10:24    Scheduled Meds: . aspirin  650 mg Oral TID  . heparin  5,000 Units Subcutaneous 3 times per day  . Influenza vac split quadrivalent PF  0.5 mL Intramuscular Tomorrow-1000  . insulin aspart  0-15 Units Subcutaneous TID WC  . insulin aspart protamine- aspart  20 Units Subcutaneous NOW  . insulin aspart protamine- aspart  35-45 Units Subcutaneous BID WC   Continuous Infusions: . sodium chloride Stopped (04/26/14 2000)    Active Problems:   DKA (diabetic ketoacidoses)   Chest pain, ROMI, DDX pericarditis   Ex-smoker   Hyponatremia   Metabolic acidosis    Time spent: 30mins    Rickey Smith  [facility] Pager [phone]. If 7PM-7AM, please contact night-coverage at [email or web address], password [email or web address] 04/27/2014, 11:18 AM  LOS: 1 day

## 2014-04-27 NOTE — Progress Notes (Signed)
Inpatient Diabetes Program Recommendations  AACE/ADA: New Consensus Statement on Inpatient Glycemic Control (2013)  Target Ranges:  Prepandial:   less than 140 mg/dL      Peak postprandial:   less than 180 mg/dL (1-2 hours)      Critically ill patients:  140 - 180 mg/dL   Results for Rickey HooseLEMONS, [name] (MRN [id number]) as of 04/27/2014 11:13  Ref. Range 04/26/2014 17:51 04/26/2014 18:50 04/26/2014 20:01 04/26/2014 21:08 04/26/2014 22:50 04/27/2014 00:06 04/27/2014 01:16 04/27/2014 02:26 04/27/2014 07:26  Glucose-Capillary Latest Range: 70-99 mg/dL 409203 (H) 811177 (H) 914223 (H) 138 (H) 98 95 116 (H) 145 (H) 235 (H)   Diabetes history: DM1 Outpatient Diabetes medications: 70/30 45 units QAM, 70/30 35 units QPM Current orders for Inpatient glycemic control: Novolog 0-15 units AC  Inpatient Diabetes Program Recommendations Insulin - Basal: Please consider ordering Lantus 20 units QHS (based on 67 kg x 0.3 units). Insulin-Correction: Please consider ordering Novolog bedtime correction scale.  Thanks, Orlando PennerMarie Dominga Mcduffie, RN, MSN, CCRN, CDE Diabetes Coordinator Inpatient Diabetes Program [phone] (Team Pager) [phone] (AP office) [phone] [facility] office)

## 2014-04-27 NOTE — Progress Notes (Signed)
UR chart review completed.  

## 2014-04-27 NOTE — Progress Notes (Signed)
INITIAL NUTRITION ASSESSMENT   INTERVENTION: CHO Modified diet   Glucerna Shake po BID, each supplement provides 220 kcal and 10 grams of protein   Recommend check current hemoglobin A 1C%  NUTRITION DIAGNOSIS: Inadequate oral intake  related to DKA as evidenced by 15% wt loss past 95 days  Goal:  Pt to meet >/= 90% of their estimated nutrition needs    Monitor:  Po intake, labs and wt trends   Reason for Assessment: Malnutrition Screen Score =  2  48 y.o. male  ASSESSMENT: Pt presents to ED with chest pain. He has hx of DM and glucose of 716 mg/dl in ED. Metabolic acidosis related to DKA. Severe wt loss associated with poor blood glucose management? According to wt hx 15% wt loss in 95 days. He is asking for more food and denies any decrease in appetite.   Labs: CBG's 145-354 mg/dl. His A 1 C% was  9.3% 01/12/2008.  Height: Ht Readings from Last 1 Encounters:  04/26/14 6\' 1"  (1.854 m)    Weight: Wt Readings from Last 1 Encounters:  04/27/14 147 lb 11.3 oz (67 kg)    Ideal Body Weight: 184# (83.6 kg)  % Ideal Body Weight: 80%  Wt Readings from Last 10 Encounters:  04/27/14 147 lb 11.3 oz (67 kg)  01/19/14 175 lb (79.379 kg)  10/11/12 178 lb (80.74 kg)    Usual Body Weight: 175-178#  % Usual Body Weight: 85%  BMI:  Body mass index is 19.49 kg/(m^2). normal range  Estimated Nutritional Needs: Kcal: [id number] Protein: 100-120 gr /day Fluid: 2.0-2.3 liters daily  Skin: intact  Diet Order: Diet Carb Modified  EDUCATION NEEDS: -No education needs identified at this time   Intake/Output Summary (Last 24 hours) at 04/27/14 1510 Last data filed at 04/27/14 0100  Gross per 24 hour  Intake 2173.35 ml  Output   1825 ml  Net 348.35 ml    Last BM: 11/4   Labs:   Recent Labs Lab 04/26/14 1449 04/26/14 1716 04/26/14 1859  NA 135* 134* 134*  K 4.3 4.6 4.3  CL 101 101 103  CO2 18* 19 19  BUN 15 13 12   CREATININE 0.75 0.70 0.65  CALCIUM 8.9 8.5  8.5  GLUCOSE 233* 191* 179*    CBG (last 3)   Recent Labs  04/27/14 0226 04/27/14 0726 04/27/14 1116  GLUCAP 145* 235* 354*    Scheduled Meds: . [START ON 04/28/2014] aspirin EC  81 mg Oral Daily  . heparin  5,000 Units Subcutaneous 3 times per day  . insulin aspart  0-15 Units Subcutaneous TID WC  . insulin aspart protamine- aspart  35 Units Subcutaneous Q supper  . [START ON 04/28/2014] insulin aspart protamine- aspart  45 Units Subcutaneous Q breakfast    Continuous Infusions: . sodium chloride Stopped (04/26/14 2000)    Past Medical History  Diagnosis Date  . Diabetes mellitus without complication     History reviewed. No pertinent past surgical history.  Royann ShiversLynn Jacque Garrels MS,RD,CSG,LDN Office: [phone] Pager: [phone]

## 2014-04-27 NOTE — Care Management Note (Signed)
    Page 1 of 1   04/27/2014     2:31:06 PM CARE MANAGEMENT NOTE 04/27/2014  Patient:  Dolores HooseLEMONS,Rashidi S   Account Number:  [id number]  Date Initiated:  04/27/2014  Documentation initiated by:  Sharrie RothmanBLACKWELL,Kashara Blocher C  Subjective/Objective Assessment:   Pt admitted from home with DKA. Pt lives alone and will return home at discharge. Pt has no PCP. Pt not currently employed and has no insurance. Pt does have a glucometer and gets insulin from [employer].     Action/Plan:   Will arrange [facility]t with [facility]. No other CM needs noted. Finanical counselor will make contact with pt about self pay status.   Anticipated DC Date:  04/28/2014   Anticipated DC Plan:  HOME/SELF CARE      DC Planning Services  CM consult      Choice offered to / List presented to:             Status of service:  Completed, signed off Medicare Important Message given?   (If response is "NO", the following Medicare IM given date fields will be blank) Date Medicare IM given:   Medicare IM given by:   Date Additional Medicare IM given:   Additional Medicare IM given by:    Discharge Disposition:  HOME/SELF CARE  Per UR Regulation:    If discussed at Long Length of Stay Meetings, dates discussed:    Comments:  04/27/14 1425 Arlyss Queenammy Felton Buczynski, RN BSN CM

## 2014-04-27 NOTE — Consult Note (Signed)
Consulting cardiologist: Dr Rickey RichJonathan Joram Venson MD  Clinical Summary Rickey Smith is a 48 y.o.male hx of DM admitted with DKA, also noted to have chest pain. Cardiology is consulted to assist with evaluation of chest pain. Describes a dull aching 1/10 chest pain in midchest that started Monday night without any other symptoms. Woke up Tuesday night with continued symptoms, but had increased to 4/10 in intensity. Constant, lasted all day Tuesday, worst with deep breaths. Began to resolve later into night, and now currently no symptoms. Denies any significant SOB, no DOE, no orthopnea, PND or LE edema.   Trop neg x 1, D-dimer pending, K 4.3, Cr 0.65, CRP <0.5 EKG sinus tach, biatrial enlargement, no ischemic changes Echo LVEF 60-65%, mild LVH, no WMAs, normal diastolic function CXR no acute process      Allergies  Allergen Reactions  . Codeine Itching    Medications Scheduled Medications: . [START ON 04/28/2014] aspirin  325 mg Oral Daily  . heparin  5,000 Units Subcutaneous 3 times per day  . insulin aspart  0-15 Units Subcutaneous TID WC  . insulin aspart protamine- aspart  20 Units Subcutaneous NOW  . insulin aspart protamine- aspart  35 Units Subcutaneous Q supper  . [START ON 04/28/2014] insulin aspart protamine- aspart  45 Units Subcutaneous Q breakfast     Infusions: . sodium chloride Stopped (04/26/14 2000)     PRN Medications:  dextrose   Past Medical History  Diagnosis Date  . Diabetes mellitus without complication     History reviewed. No pertinent past surgical history.  Family History  Problem Relation Age of Onset  . Heart attack    . Prostate cancer    . Hypertension      Social History Rickey Smith reports that he has quit smoking. He does not have any smokeless tobacco history on file. Rickey Smith reports that he does not drink alcohol.  Review of Systems CONSTITUTIONAL: No weight loss, fever, chills, weakness or fatigue.  HEENT: Eyes: No visual  loss, blurred vision, double vision or yellow sclerae. No hearing loss, sneezing, congestion, runny nose or sore throat.  SKIN: No rash or itching.  CARDIOVASCULAR: per HPI  RESPIRATORY: No shortness of breath, cough or sputum.  GASTROINTESTINAL: No anorexia, nausea, vomiting or diarrhea. No abdominal pain or blood.  GENITOURINARY: no polyuria, no dysuria NEUROLOGICAL: No headache, dizziness, syncope, paralysis, ataxia, numbness or tingling in the extremities. No change in bowel or bladder control.  MUSCULOSKELETAL: No muscle, back pain, joint pain or stiffness.  HEMATOLOGIC: No anemia, bleeding or bruising.  LYMPHATICS: No enlarged nodes. No history of splenectomy.  PSYCHIATRIC: No history of depression or anxiety.      Physical Examination Blood pressure 117/70, pulse 75, temperature 97.9 F (36.6 C), temperature source Oral, resp. rate 17, height 6\' 1"  (1.854 m), weight 147 lb 11.3 oz (67 kg), SpO2 98 %.  Intake/Output Summary (Last 24 hours) at 04/27/14 1136 Last data filed at 04/27/14 0100  Gross per 24 hour  Intake 2173.35 ml  Output   1825 ml  Net 348.35 ml    HEENT: sclera clear  Cardiovascular: RRR, no m/r/g, no JVD, no carotid bruits  Respiratory: CTAB  GI: abdomen soft, NT, ND  MSK: no LE edema  Neuro: no focal deficits  Psych: appropriate affect   Lab Results  Basic Metabolic Panel:  Recent Labs Lab 04/26/14 0945 04/26/14 1353 04/26/14 1449 04/26/14 1716 04/26/14 1859  NA 126* 134* 135* 134* 134*  K 5.5* 4.3  4.3 4.6 4.3  CL 86* 99 101 101 103  CO2 16* 16* 18* 19 19  GLUCOSE 716* 291* 233* 191* 179*  BUN 22 16 15 13 12   CREATININE 0.97 0.76 0.75 0.70 0.65  CALCIUM 9.6 8.7 8.9 8.5 8.5    Liver Function Tests: No results for input(s): AST, ALT, ALKPHOS, BILITOT, PROT, ALBUMIN in the last 168 hours.  CBC:  Recent Labs Lab 04/26/14 0945  WBC 8.7  NEUTROABS 6.5  HGB 16.1  HCT 45.5  MCV 84.4  PLT PLATELET CLUMPS NOTED ON SMEAR, COUNT  APPEARS ADEQUATE    Cardiac Enzymes:  Recent Labs Lab 04/26/14 0945  TROPONINI <0.30    BNP: Invalid input(s): POCBNP     1. Chest pain - atypical in description, lasted all day Tuesday and worst with deep breathing. No evidence of ACS by EKG or cardiac enzymes, though trop neg x1, additional troponin pending. Echo with normal systolic function, no WMAs.  EKG without changes consistent with pericarditis, CRP negative, and no effusion on echo make less likely. Agree with stopping ASA and colchicine.  - no further cardiac testing planned at this time, f/u repeat EKG and troponin. Symptoms consistent with pleuritic or MSK chest pain now resolved. - change ASA to 81mg , would continue for primary prevention in DM patient.  - will add lipid panel to AM labs, Hgb A1c  2. Sinus tachycardia - tachycardic on initial presentation in setting of DKA, rates now normalized. Suspect related to DKA, metabolic acidosis, and volume depletion. Rates have since normalized.   3. DM with DKA - per primary team   Rickey RichJonathan Nikkie Liming, M.D.

## 2014-04-27 NOTE — Progress Notes (Signed)
Called report to Jackquline BoschMatthew Wright, RN on dept 300.  Verbalized understanding.  Pt transferred to floor in safe and stable condition. Schonewitz, Candelaria StagersLeigh Anne 04/27/2014

## 2014-04-28 DIAGNOSIS — R079 Chest pain, unspecified: Secondary | ICD-10-CM

## 2014-04-28 DIAGNOSIS — IMO0002 Reserved for concepts with insufficient information to code with codable children: Secondary | ICD-10-CM | POA: Insufficient documentation

## 2014-04-28 DIAGNOSIS — E1040 Type 1 diabetes mellitus with diabetic neuropathy, unspecified: Secondary | ICD-10-CM | POA: Insufficient documentation

## 2014-04-28 DIAGNOSIS — E1065 Type 1 diabetes mellitus with hyperglycemia: Secondary | ICD-10-CM

## 2014-04-28 LAB — LIPID PANEL
CHOL/HDL RATIO: 6 ratio
CHOLESTEROL: 198 mg/dL (ref 0–200)
HDL: 33 mg/dL — ABNORMAL LOW (ref >39)
LDL Cholesterol: UNDETERMINED mg/dL (ref 0–99)
Triglycerides: 462 mg/dL — ABNORMAL HIGH (ref <150)
VLDL: UNDETERMINED mg/dL (ref 0–40)

## 2014-04-28 LAB — HEPATIC FUNCTION PANEL
ALT: 9 U/L (ref 0–53)
AST: 11 U/L (ref 0–37)
Albumin: 3.1 g/dL — ABNORMAL LOW (ref 3.5–5.2)
Alkaline Phosphatase: 93 U/L (ref 39–117)
Total Bilirubin: <0.2 mg/dL — ABNORMAL LOW (ref 0.3–1.2)
Total Protein: 5.8 g/dL — ABNORMAL LOW (ref 6.0–8.3)

## 2014-04-28 LAB — BASIC METABOLIC PANEL
Anion gap: 12 (ref 5–15)
BUN: 11 mg/dL (ref 6–23)
CHLORIDE: 102 meq/L (ref 96–112)
CO2: 23 meq/L (ref 19–32)
Calcium: 8.3 mg/dL — ABNORMAL LOW (ref 8.4–10.5)
Creatinine, Ser: 0.66 mg/dL (ref 0.50–1.35)
GFR calc non Af Amer: >90 mL/min (ref >90)
Glucose, Bld: 253 mg/dL — ABNORMAL HIGH (ref 70–99)
Potassium: 3.2 mEq/L — ABNORMAL LOW (ref 3.7–5.3)
Sodium: 137 mEq/L (ref 137–147)

## 2014-04-28 LAB — GLUCOSE, CAPILLARY
GLUCOSE-CAPILLARY: 76 mg/dL (ref 70–99)
GLUCOSE-CAPILLARY: 281 mg/dL — AB (ref 70–99)
GLUCOSE-CAPILLARY: 189 mg/dL — AB (ref 70–99)
Glucose-Capillary: 307 mg/dL — ABNORMAL HIGH (ref 70–99)

## 2014-04-28 LAB — HEMOGLOBIN A1C
Hgb A1c MFr Bld: 12.5 % — ABNORMAL HIGH (ref <5.7)
Mean Plasma Glucose: 312 mg/dL — ABNORMAL HIGH (ref <117)

## 2014-04-28 MED ORDER — ASPIRIN 81 MG PO TBEC: 81.0000 mg | DELAYED_RELEASE_TABLET | Freq: Every day | ORAL | Status: DC

## 2014-04-28 MED ORDER — PRAVASTATIN SODIUM 40 MG PO TABS: 40.0000 mg | ORAL_TABLET | Freq: Every day | ORAL | Status: DC

## 2014-04-28 MED ORDER — POTASSIUM CHLORIDE CRYS ER 20 MEQ PO TBCR
40.0000 meq | EXTENDED_RELEASE_TABLET | ORAL | Status: AC
  Administered 2014-04-28 (×2): 40 meq via ORAL
  Filled 2014-04-28 (×2): qty 2

## 2014-04-28 MED ORDER — ATORVASTATIN CALCIUM 40 MG PO TABS: 40.0000 mg | ORAL_TABLET | Freq: Every day | ORAL | Status: DC

## 2014-04-28 NOTE — Discharge Summary (Signed)
Physician Discharge Summary  Rickey Smith:[id number] DOB: 10/09/1965 DOA: 04/26/2014  PCP: No PCP Per Patient  Admit date: 04/26/2014 Discharge date: 04/28/2014  Time spent: 40 minutes  Recommendations for Outpatient Follow-up:  1. Patient has been [facility] up [facility]  Discharge Diagnoses:  Active Problems:   DKA (diabetic ketoacidoses)   Chest pain, ROMI, DDX pericarditis   Ex-smoker   Hyponatremia   Metabolic acidosis   Diabetic ketoacidosis without coma associated with type 1 diabetes mellitus Hyperlipidemia  Discharge Condition: improved  Diet recommendation: low salt, low carb  Filed Weights   04/26/14 0935 04/26/14 1404 04/27/14 0500  Weight: 77.111 kg (170 lb) 65.5 kg (144 lb 6.4 oz) 67 kg (147 lb 11.3 oz)    History of present illness:  This patient presented to the hospital with complaints of chest pain, polyuria. He was found to have diabetic ketoacidosis and was admitted to the hospital for further treatments.  Hospital Course:  Regarding his diabetic ketoacidosis, patient was started on intravenous hydration as well as insulin infusion. His anion gap closed with treatment and he was transitioned back to subcutaneous insulin. His blood sugars have since been stable and he is feeling significantly improved. He was seen by diabetes coordinator. He'll be set up with primary care doctor for further follow-up regarding diabetes.  Regarding his chest pain, he ruled out for ACS with negative cardiac markers and a nonacute EKG. Echocardiogram was also found to be unremarkable. D-dimer was also negative. He was seen by cardiology who did not recommend any further cardiac workup at this point. It was felt that his pain is likely muscular/pleuritic in nature. Since admission, his chest pain has resolved and he is feeling significantly improved. He was noted to be significantly hyperlipidemic and was started on a statin. Patient will be discharged  home today.  Procedures:  Echo: - Left ventricle: The cavity size was normal. Wall thickness was increased in a pattern of mild LVH. Systolic function was normal. The estimated ejection fraction was in the range of 60% to 65%. Wall motion was normal; there were no regional wall motion abnormalities. Left ventricular diastolic function parameters were normal. - Aorta: Mild aortic root dilatation. Aortic root dimension: 40 mm (ED). - Pericardium, extracardiac: There was no pericardial effusion.  Consultations:  Cardiology  Discharge Exam: Filed Vitals:   04/28/14 1400  BP: 105/67  Pulse: 94  Temp: 98.3 F (36.8 C)  Resp: 18    General: NAD Cardiovascular: s1, s2, rrr Respiratory: cta b  Discharge Instructions You were cared for by a hospitalist during your hospital stay. If you have any questions about your discharge medications or the care you received while you were in the hospital after you are discharged, you can call the unit and asked to speak with the hospitalist on call if the hospitalist that took care of you is not available. Once you are discharged, your primary care physician will handle any further medical issues. Please note that NO REFILLS for any discharge medications will be authorized once you are discharged, as it is imperative that you return to your primary care physician (or establish a relationship with a primary care physician if you do not have one) for your aftercare needs so that they can reassess your need for medications and monitor your lab values.  Discharge Instructions    Diet - low sodium heart healthy    Complete by:  As directed      Diet  Carb Modified    Complete by:  As directed      Increase activity slowly    Complete by:  As directed           Current Discharge Medication List    START taking these medications   Details  aspirin EC 81 MG EC tablet Take 1 tablet (81 mg total) by mouth daily. Qty: 30 tablet, Refills: 1     pravastatin (PRAVACHOL) 40 MG tablet Take 1 tablet (40 mg total) by mouth daily. Qty: 30 tablet, Refills: 1      CONTINUE these medications which have NOT CHANGED   Details  insulin NPH-regular (NOVOLIN 70/30) (70-30) 100 UNIT/ML injection Inject 35-45 Units into the skin See admin instructions. Takes 45 units in the morning, and 35 at bedtime       Allergies  Allergen Reactions  . Codeine Itching   Follow-up Information    [facility]   Why:  at 9:00   Contact information:   [address] [place] [address] [phone]       Follow up with Joni ReiningKathryn Lawrence, NP On 05/13/2014.   Specialty:  Nurse Practitioner   Why:  at 1:10pm   Contact information:   [address] [place] [address] [phone]        The results of significant diagnostics from this hospitalization (including imaging, microbiology, ancillary and laboratory) are listed below for reference.    Significant Diagnostic Studies: Dg Chest Portable 1 View  04/26/2014   CLINICAL DATA:  Mid chest pain.  EXAM: PORTABLE CHEST - 1 VIEW  COMPARISON:  01/25/2008  FINDINGS: Normal heart size and mediastinal contours. No acute infiltrate or edema. No effusion or pneumothorax. Remote left clavicle fracture. No acute osseous findings.  IMPRESSION: No active disease.   Electronically Signed   By: Tiburcio PeaJonathan  Watts M.D.   On: 04/26/2014 10:24    Microbiology: Recent Results (from the past 240 hour(s))  MRSA PCR Screening     Status: None   Collection Time: 04/26/14  1:30 PM  Result Value Ref Range Status   MRSA by PCR NEGATIVE NEGATIVE Final    Comment:        The GeneXpert MRSA Assay (FDA approved for NASAL specimens only), is one component of a comprehensive MRSA colonization surveillance program. It is not intended to diagnose MRSA infection nor to guide or monitor treatment for MRSA infections.      Labs: Basic Metabolic Panel:  Recent Labs Lab  04/26/14 1353 04/26/14 1449 04/26/14 1716 04/26/14 1859 04/28/14 0516  NA 134* 135* 134* 134* 137  K 4.3 4.3 4.6 4.3 3.2*  CL 99 101 101 103 102  CO2 16* 18* 19 19 23   GLUCOSE 291* 233* 191* 179* 253*  BUN 16 15 13 12 11   CREATININE 0.76 0.75 0.70 0.65 0.66  CALCIUM 8.7 8.9 8.5 8.5 8.3*   Liver Function Tests:  Recent Labs Lab 04/28/14 0906  AST 11  ALT 9  ALKPHOS 93  BILITOT <0.2*  PROT 5.8*  ALBUMIN 3.1*   No results for input(s): LIPASE, AMYLASE in the last 168 hours. No results for input(s): AMMONIA in the last 168 hours. CBC:  Recent Labs Lab 04/26/14 0945  WBC 8.7  NEUTROABS 6.5  HGB 16.1  HCT 45.5  MCV 84.4  PLT PLATELET CLUMPS NOTED ON SMEAR, COUNT APPEARS ADEQUATE   Cardiac Enzymes:  Recent Labs Lab 04/26/14 0945 04/27/14 1139  TROPONINI <  0.30 <0.30   BNP: BNP (last 3 results) No results for input(s): PROBNP in the last 8760 hours. CBG:  Recent Labs Lab 04/27/14 1616 04/27/14 2100 04/28/14 0241 04/28/14 0732 04/28/14 1115  GLUCAP 260* 307* 76 281* 189*       Signed:  Estefanny Moler  [facility] 04/28/2014, 3:07 PM

## 2014-04-28 NOTE — Plan of Care (Signed)
Problem: Phase I Progression Outcomes Goal: OOB as tolerated unless otherwise ordered Outcome: Completed/Met Date Met:  04/28/14     

## 2014-04-28 NOTE — Plan of Care (Signed)
Problem: Phase I Progression Outcomes Goal: Diabetes Coordinator Consult Outcome: Completed/Met Date Met:  04/28/14

## 2014-04-28 NOTE — Plan of Care (Signed)
Problem: Phase I Progression Outcomes Goal: Voiding-avoid urinary catheter unless indicated Outcome: Completed/Met Date Met:  04/28/14     

## 2014-04-28 NOTE — Progress Notes (Signed)
Subjective:    No recurrence of chest pain  Objective:   Temp:  [97.4 F (36.3 C)-97.9 F (36.6 C)] 97.9 F (36.6 C) (11/05 0530) Pulse Rate:  [81-131] 95 (11/05 0530) Resp:  [16-24] 18 (11/05 0530) BP: (94-180)/(49-94) 111/77 mmHg (11/05 0530) SpO2:  [97 %-100 %] 99 % (11/05 0530) Last BM Date: 04/27/14  Filed Weights   04/26/14 0935 04/26/14 1404 04/27/14 0500  Weight: 170 lb (77.111 kg) 144 lb 6.4 oz (65.5 kg) 147 lb 11.3 oz (67 kg)   No intake or output data in the 24 hours ending 04/28/14 0848  Telemetry: none  Exam:  General: NAD  Resp: CTAB  Cardiac: RRR, no m/r/g, no JVD, no carotid bruits  GI: abdomen soft, NT, ND  MSK: no LE edema  Neuro: no focal deficits  Psych: appropriate affect  Lab Results:  Basic Metabolic Panel:  Recent Labs Lab 04/26/14 1716 04/26/14 1859 04/28/14 0516  NA 134* 134* 137  K 4.6 4.3 3.2*  CL 101 103 102  CO2 19 19 23   GLUCOSE 191* 179* 253*  BUN 13 12 11   CREATININE 0.70 0.65 0.66  CALCIUM 8.5 8.5 8.3*    Liver Function Tests: No results for input(s): AST, ALT, ALKPHOS, BILITOT, PROT, ALBUMIN in the last 168 hours.  CBC:  Recent Labs Lab 04/26/14 0945  WBC 8.7  HGB 16.1  HCT 45.5  MCV 84.4  PLT PLATELET CLUMPS NOTED ON SMEAR, COUNT APPEARS ADEQUATE    Cardiac Enzymes:  Recent Labs Lab 04/26/14 0945 04/27/14 1139  TROPONINI <0.30 <0.30    BNP: No results for input(s): PROBNP in the last 8760 hours.  Coagulation: No results for input(s): INR in the last 168 hours.  ECG:   Medications:   Scheduled Medications: . aspirin EC  81 mg Oral Daily  . heparin  5,000 Units Subcutaneous 3 times per day  . insulin aspart  0-15 Units Subcutaneous TID WC  . insulin aspart protamine- aspart  35 Units Subcutaneous Q supper  . insulin aspart protamine- aspart  45 Units Subcutaneous Q breakfast  . potassium chloride  40 mEq Oral Q3H     Infusions: . sodium chloride Stopped (04/26/14 2000)      PRN Medications:  dextrose     Assessment/Plan     1. Chest pain - atypical in description, lasted all day Tuesday and worst with deep breathing. No evidence of ACS by EKG or cardiac enzymes, though trop neg x2,. Echo with normal systolic function, no WMAs. EKG without changes consistent with pericarditis, CRP negative, and no effusion on echo make less likely. D-dimer negative, no hypoxia, PE unlikely.   - repeat EKG ordered yesterday, do not see results. Will reorder. - no further cardiac testing planned at this timeSymptoms consistent with pleuritic or MSK chest pain now resolved.   2. Sinus tachycardia - tachycardic on initial presentation in setting of DKA, rates now normalized. Suspect related to DKA, metabolic acidosis, and volume depletion. Rates have since normalized.   3. DM with DKA - per primary team  4. Hyperlipidemia - lipid panel shows elevate TGs, unable to calculate LDL. Primary goal with elevated TGs is DM control. Given his DM based on most recent lipid guidelines he should be on at least moderate strength statin, will start atorva 40mg  daily.    Will sign off of inpatient care. Will have him follow up with us in 2 weeks in clinic.  Dominga FerryJ Andersyn Fragoso MD    Christiane HaJonathan  Harl Bowie, M.D.

## 2014-04-28 NOTE — Plan of Care (Signed)
Problem: Phase II Progression Outcomes Goal: Tolerating diet Outcome: Completed/Met Date Met:  04/28/14     

## 2014-05-13 ENCOUNTER — Encounter: Payer: Self-pay | Admitting: Adult Health

## 2014-06-21 ENCOUNTER — Encounter: Payer: Self-pay | Admitting: *Deleted

## 2014-07-13 ENCOUNTER — Emergency Department (HOSPITAL_COMMUNITY): Payer: Self-pay

## 2014-07-13 ENCOUNTER — Inpatient Hospital Stay (HOSPITAL_COMMUNITY)
Admission: EM | Admit: 2014-07-13 | Discharge: 2014-07-14 | DRG: 639 | Disposition: A | Discharge status: Home / Self Care | Payer: Self-pay | Attending: Internal Medicine | Admitting: Internal Medicine

## 2014-07-13 ENCOUNTER — Encounter (HOSPITAL_COMMUNITY): Payer: Self-pay | Admitting: *Deleted

## 2014-07-13 DIAGNOSIS — R112 Nausea with vomiting, unspecified: Secondary | ICD-10-CM

## 2014-07-13 DIAGNOSIS — Z7982 Long term (current) use of aspirin: Secondary | ICD-10-CM

## 2014-07-13 DIAGNOSIS — Z87891 Personal history of nicotine dependence: Secondary | ICD-10-CM

## 2014-07-13 DIAGNOSIS — I959 Hypotension, unspecified: Secondary | ICD-10-CM | POA: Diagnosis present

## 2014-07-13 DIAGNOSIS — R109 Unspecified abdominal pain: Secondary | ICD-10-CM

## 2014-07-13 DIAGNOSIS — R197 Diarrhea, unspecified: Secondary | ICD-10-CM

## 2014-07-13 DIAGNOSIS — E860 Dehydration: Secondary | ICD-10-CM | POA: Diagnosis present

## 2014-07-13 DIAGNOSIS — I509 Heart failure, unspecified: Secondary | ICD-10-CM | POA: Diagnosis present

## 2014-07-13 DIAGNOSIS — Z885 Allergy status to narcotic agent status: Secondary | ICD-10-CM

## 2014-07-13 DIAGNOSIS — E1100 Type 2 diabetes mellitus with hyperosmolarity without nonketotic hyperglycemic-hyperosmolar coma (NKHHC): Secondary | ICD-10-CM

## 2014-07-13 DIAGNOSIS — R739 Hyperglycemia, unspecified: Secondary | ICD-10-CM

## 2014-07-13 DIAGNOSIS — E1300 Other specified diabetes mellitus with hyperosmolarity without nonketotic hyperglycemic-hyperosmolar coma (NKHHC): Principal | ICD-10-CM | POA: Diagnosis present

## 2014-07-13 HISTORY — DX: Heart failure, unspecified: I50.9

## 2014-07-13 LAB — I-STAT CG4 LACTIC ACID, ED: LACTIC ACID, VENOUS: 2.79 mmol/L — AB (ref 0.5–2.2)

## 2014-07-13 LAB — I-STAT CHEM 8, ED
BUN: 19 mg/dL (ref 6–23)
CREATININE: 0.9 mg/dL (ref 0.50–1.35)
Calcium, Ion: 1.08 mmol/L — ABNORMAL LOW (ref 1.12–1.23)
Chloride: 100 mEq/L (ref 96–112)
Glucose, Bld: 322 mg/dL — ABNORMAL HIGH (ref 70–99)
HCT: 38 % — ABNORMAL LOW (ref 39.0–52.0)
HEMOGLOBIN: 12.9 g/dL — AB (ref 13.0–17.0)
Potassium: 3.6 mmol/L (ref 3.5–5.1)
Sodium: 136 mmol/L (ref 135–145)
TCO2: 19 mmol/L (ref 0–100)

## 2014-07-13 LAB — BASIC METABOLIC PANEL
ANION GAP: 1 — AB (ref 5–15)
ANION GAP: 4 — AB (ref 5–15)
Anion gap: 5 (ref 5–15)
BUN: 18 mg/dL (ref 6–23)
BUN: 17 mg/dL (ref 6–23)
BUN: 14 mg/dL (ref 6–23)
CHLORIDE: 109 meq/L (ref 96–112)
CO2: 23 mmol/L (ref 19–32)
CO2: 26 mmol/L (ref 19–32)
CO2: 26 mmol/L (ref 19–32)
Calcium: 6.9 mg/dL — ABNORMAL LOW (ref 8.4–10.5)
Calcium: 7.6 mg/dL — ABNORMAL LOW (ref 8.4–10.5)
Calcium: 7.6 mg/dL — ABNORMAL LOW (ref 8.4–10.5)
Chloride: 107 mEq/L (ref 96–112)
Chloride: 108 mEq/L (ref 96–112)
Creatinine, Ser: 0.94 mg/dL (ref 0.50–1.35)
Creatinine, Ser: 0.8 mg/dL (ref 0.50–1.35)
Creatinine, Ser: 0.9 mg/dL (ref 0.50–1.35)
GFR calc Af Amer: >90 mL/min (ref >90)
GFR calc Af Amer: >90 mL/min (ref >90)
GFR calc Af Amer: >90 mL/min (ref >90)
GFR calc non Af Amer: >90 mL/min (ref >90)
GFR calc non Af Amer: >90 mL/min (ref >90)
GLUCOSE: 285 mg/dL — AB (ref 70–99)
GLUCOSE: 128 mg/dL — AB (ref 70–99)
Glucose, Bld: 166 mg/dL — ABNORMAL HIGH (ref 70–99)
POTASSIUM: 3.2 mmol/L — AB (ref 3.5–5.1)
Potassium: 4.1 mmol/L (ref 3.5–5.1)
Potassium: 3.3 mmol/L — ABNORMAL LOW (ref 3.5–5.1)
SODIUM: 136 mmol/L (ref 135–145)
SODIUM: 138 mmol/L (ref 135–145)
Sodium: 135 mmol/L (ref 135–145)

## 2014-07-13 LAB — URINALYSIS, ROUTINE W REFLEX MICROSCOPIC
Bilirubin Urine: NEGATIVE
Glucose, UA: >1000 mg/dL — AB
Ketones, ur: 15 mg/dL — AB
Leukocytes, UA: NEGATIVE
NITRITE: NEGATIVE
PH: 5.5 (ref 5.0–8.0)
Protein, ur: NEGATIVE mg/dL
Urobilinogen, UA: 0.2 mg/dL (ref 0.0–1.0)

## 2014-07-13 LAB — URINE MICROSCOPIC-ADD ON

## 2014-07-13 LAB — COMPREHENSIVE METABOLIC PANEL
ALBUMIN: 3.2 g/dL — AB (ref 3.5–5.2)
ALT: 16 U/L (ref 0–53)
AST: 13 U/L (ref 0–37)
Alkaline Phosphatase: 84 U/L (ref 39–117)
Anion gap: 10 (ref 5–15)
BUN: 22 mg/dL (ref 6–23)
CALCIUM: 7.9 mg/dL — AB (ref 8.4–10.5)
CO2: 24 mmol/L (ref 19–32)
Chloride: 97 mEq/L (ref 96–112)
Creatinine, Ser: 1.27 mg/dL (ref 0.50–1.35)
GFR calc Af Amer: 76 mL/min — ABNORMAL LOW (ref >90)
GFR calc non Af Amer: 66 mL/min — ABNORMAL LOW (ref >90)
Glucose, Bld: 507 mg/dL — ABNORMAL HIGH (ref 70–99)
Potassium: 4.3 mmol/L (ref 3.5–5.1)
SODIUM: 131 mmol/L — AB (ref 135–145)
TOTAL PROTEIN: 6.1 g/dL (ref 6.0–8.3)
Total Bilirubin: 0.6 mg/dL (ref 0.3–1.2)

## 2014-07-13 LAB — CBC WITH DIFFERENTIAL/PLATELET
BASOS ABS: 0 10*3/uL (ref 0.0–0.1)
Basophils Relative: 0 % (ref 0–1)
EOS ABS: 0 10*3/uL (ref 0.0–0.7)
EOS PCT: 0 % (ref 0–5)
HCT: 34.3 % — ABNORMAL LOW (ref 39.0–52.0)
Hemoglobin: 11.9 g/dL — ABNORMAL LOW (ref 13.0–17.0)
LYMPHS PCT: 3 % — AB (ref 12–46)
Lymphs Abs: 0.3 10*3/uL — ABNORMAL LOW (ref 0.7–4.0)
MCH: 29.4 pg (ref 26.0–34.0)
MCHC: 34.7 g/dL (ref 30.0–36.0)
MCV: 84.7 fL (ref 78.0–100.0)
MONO ABS: 0.4 10*3/uL (ref 0.1–1.0)
Monocytes Relative: 4 % (ref 3–12)
NEUTROS ABS: 8 10*3/uL — AB (ref 1.7–7.7)
NEUTROS PCT: 92 % — AB (ref 43–77)
PLATELETS: 118 10*3/uL — AB (ref 150–400)
RBC: 4.05 MIL/uL — AB (ref 4.22–5.81)
RDW: 12.2 % (ref 11.5–15.5)
Smear Review: DECREASED
WBC: 8.7 10*3/uL (ref 4.0–10.5)

## 2014-07-13 LAB — GLUCOSE, CAPILLARY
GLUCOSE-CAPILLARY: 506 mg/dL — AB (ref 70–99)
GLUCOSE-CAPILLARY: 295 mg/dL — AB (ref 70–99)
GLUCOSE-CAPILLARY: 166 mg/dL — AB (ref 70–99)
GLUCOSE-CAPILLARY: 128 mg/dL — AB (ref 70–99)
GLUCOSE-CAPILLARY: 119 mg/dL — AB (ref 70–99)
Glucose-Capillary: 266 mg/dL — ABNORMAL HIGH (ref 70–99)
Glucose-Capillary: 221 mg/dL — ABNORMAL HIGH (ref 70–99)
Glucose-Capillary: 138 mg/dL — ABNORMAL HIGH (ref 70–99)
Glucose-Capillary: 201 mg/dL — ABNORMAL HIGH (ref 70–99)
Glucose-Capillary: 131 mg/dL — ABNORMAL HIGH (ref 70–99)
Glucose-Capillary: 297 mg/dL — ABNORMAL HIGH (ref 70–99)

## 2014-07-13 LAB — HEMOGLOBIN A1C
HEMOGLOBIN A1C: 12.2 % — AB (ref <5.7)
MEAN PLASMA GLUCOSE: 303 mg/dL — AB (ref <117)

## 2014-07-13 LAB — CBG MONITORING, ED
Glucose-Capillary: 363 mg/dL — ABNORMAL HIGH (ref 70–99)
Glucose-Capillary: 272 mg/dL — ABNORMAL HIGH (ref 70–99)

## 2014-07-13 LAB — I-STAT TROPONIN, ED: Troponin i, poc: 0 ng/mL (ref 0.00–0.08)

## 2014-07-13 LAB — MRSA PCR SCREENING: MRSA by PCR: NEGATIVE

## 2014-07-13 MED ORDER — INSULIN ASPART 100 UNIT/ML ~~LOC~~ SOLN
0.0000 [IU] | Freq: Three times a day (TID) | SUBCUTANEOUS | Status: DC
  Administered 2014-07-14: 8 [IU] via SUBCUTANEOUS
  Administered 2014-07-14: 2 [IU] via SUBCUTANEOUS

## 2014-07-13 MED ORDER — SODIUM CHLORIDE 0.9 % IV SOLN
1000.0000 mL | Freq: Once | INTRAVENOUS | Status: AC
  Administered 2014-07-13: 1000 mL via INTRAVENOUS

## 2014-07-13 MED ORDER — SODIUM CHLORIDE 0.9 % IV BOLUS (SEPSIS)
1000.0000 mL | Freq: Once | INTRAVENOUS | Status: AC
  Administered 2014-07-13: 1000 mL via INTRAVENOUS

## 2014-07-13 MED ORDER — ONDANSETRON HCL 4 MG PO TABS: 4.0000 mg | ORAL_TABLET | Freq: Four times a day (QID) | ORAL | Status: DC | PRN

## 2014-07-13 MED ORDER — DEXTROSE-NACL 5-0.45 % IV SOLN
INTRAVENOUS | Status: DC
  Administered 2014-07-13: 11:00:00 via INTRAVENOUS

## 2014-07-13 MED ORDER — SODIUM CHLORIDE 0.9 % IJ SOLN
3.0000 mL | Freq: Two times a day (BID) | INTRAMUSCULAR | Status: DC
Start: 2014-07-13 — End: 2014-07-14
  Administered 2014-07-13 – 2014-07-14 (×3): 3 mL via INTRAVENOUS

## 2014-07-13 MED ORDER — SODIUM CHLORIDE 0.9 % IV SOLN
INTRAVENOUS | Status: DC
  Administered 2014-07-13: 4.5 [IU]/h via INTRAVENOUS
  Filled 2014-07-13: qty 2.5

## 2014-07-13 MED ORDER — SODIUM CHLORIDE 0.9 % IV SOLN
INTRAVENOUS | Status: DC
  Administered 2014-07-13: 450 mL via INTRAVENOUS

## 2014-07-13 MED ORDER — SODIUM CHLORIDE 0.9 % IV SOLN
INTRAVENOUS | Status: DC
  Administered 2014-07-13: 09:00:00 via INTRAVENOUS

## 2014-07-13 MED ORDER — POTASSIUM CHLORIDE CRYS ER 20 MEQ PO TBCR
40.0000 meq | EXTENDED_RELEASE_TABLET | Freq: Once | ORAL | Status: AC
  Administered 2014-07-13: 40 meq via ORAL
  Filled 2014-07-13: qty 2

## 2014-07-13 MED ORDER — GABAPENTIN 300 MG PO CAPS
300.0000 mg | ORAL_CAPSULE | Freq: Two times a day (BID) | ORAL | Status: DC
  Administered 2014-07-13 – 2014-07-14 (×3): 300 mg via ORAL
  Filled 2014-07-13 (×3): qty 1

## 2014-07-13 MED ORDER — ONDANSETRON HCL 4 MG/2ML IJ SOLN: 4.0000 mg | Freq: Four times a day (QID) | INTRAMUSCULAR | Status: DC | PRN

## 2014-07-13 MED ORDER — SODIUM CHLORIDE 0.9 % IV SOLN
INTRAVENOUS | Status: DC
  Administered 2014-07-13: 3.2 [IU]/h via INTRAVENOUS
  Administered 2014-07-13: 2.3 [IU]/h via INTRAVENOUS
  Administered 2014-07-13: 5.6 [IU]/h via INTRAVENOUS
  Administered 2014-07-13: 2 [IU]/h via INTRAVENOUS
  Administered 2014-07-13: 6.4 [IU]/h via INTRAVENOUS
  Filled 2014-07-13: qty 2.5

## 2014-07-13 MED ORDER — SODIUM CHLORIDE 0.9 % IV SOLN
1000.0000 mL | INTRAVENOUS | Status: DC
  Administered 2014-07-13: 1000 mL via INTRAVENOUS

## 2014-07-13 MED ORDER — ASPIRIN EC 81 MG PO TBEC
81.0000 mg | DELAYED_RELEASE_TABLET | Freq: Every day | ORAL | Status: DC
  Administered 2014-07-13 – 2014-07-14 (×2): 81 mg via ORAL
  Filled 2014-07-13 (×2): qty 1

## 2014-07-13 MED ORDER — INSULIN ASPART PROT & ASPART (70-30 MIX) 100 UNIT/ML ~~LOC~~ SUSP
18.0000 [IU] | Freq: Two times a day (BID) | SUBCUTANEOUS | Status: DC
  Administered 2014-07-13 – 2014-07-14 (×2): 18 [IU] via SUBCUTANEOUS
  Filled 2014-07-13: qty 10

## 2014-07-13 MED ORDER — ACETAMINOPHEN 325 MG PO TABS
650.0000 mg | ORAL_TABLET | ORAL | Status: DC | PRN
  Administered 2014-07-13 – 2014-07-14 (×2): 650 mg via ORAL
  Filled 2014-07-13 (×2): qty 2

## 2014-07-13 MED ORDER — POTASSIUM CHLORIDE 10 MEQ/100ML IV SOLN
10.0000 meq | INTRAVENOUS | Status: AC
  Administered 2014-07-13 (×2): 10 meq via INTRAVENOUS
  Filled 2014-07-13 (×2): qty 100

## 2014-07-13 MED ORDER — ONDANSETRON HCL 4 MG/2ML IJ SOLN
4.0000 mg | Freq: Once | INTRAMUSCULAR | Status: AC
  Administered 2014-07-13: 4 mg via INTRAVENOUS
  Filled 2014-07-13: qty 2

## 2014-07-13 MED ORDER — INSULIN ASPART 100 UNIT/ML ~~LOC~~ SOLN
0.0000 [IU] | Freq: Every day | SUBCUTANEOUS | Status: DC
  Administered 2014-07-13: 3 [IU] via SUBCUTANEOUS

## 2014-07-13 MED ORDER — GI COCKTAIL ~~LOC~~
30.0000 mL | Freq: Once | ORAL | Status: AC
  Administered 2014-07-13: 30 mL via ORAL
  Filled 2014-07-13: qty 30

## 2014-07-13 MED ORDER — HEPARIN SODIUM (PORCINE) 5000 UNIT/ML IJ SOLN
5000.0000 [IU] | Freq: Three times a day (TID) | INTRAMUSCULAR | Status: DC
  Administered 2014-07-13 – 2014-07-14 (×3): 5000 [IU] via SUBCUTANEOUS
  Filled 2014-07-13 (×3): qty 1

## 2014-07-13 MED ORDER — PRAVASTATIN SODIUM 40 MG PO TABS
40.0000 mg | ORAL_TABLET | Freq: Every day | ORAL | Status: DC
  Administered 2014-07-13 – 2014-07-14 (×2): 40 mg via ORAL
  Filled 2014-07-13 (×2): qty 1

## 2014-07-13 MED ORDER — SODIUM CHLORIDE 0.9 % IV SOLN: INTRAVENOUS | Status: AC

## 2014-07-13 NOTE — ED Provider Notes (Signed)
CSN: [id number]     Arrival date & time 07/13/14  0319 History   First MD Initiated Contact with Patient 07/13/14 [phone]     Chief Complaint  Patient presents with  . Abdominal Pain     (Consider location/radiation/quality/duration/timing/severity/associated sxs/prior Treatment) HPI Patient states he was diagnosed with diabetes in 2007. He states he only saw doctor on a regular basis the first 6 weeks and since then he has been managing his diabetes on his own. He states he gets the insulin from the drugstore without a doctor's prescription for many years. He reports 5 days ago he started having nausea and diarrhea about 3-4 times a day. However earlier today on January 19 the diarrhea got better however he started having vomiting. He estimates he has vomited 15-20 times today. There is been no blood in his vomiting or diarrhea. He states he's having epigastric abdominal pain and some low back pain. He states about 9 PM his chest feeling tight and he felt short of breath. He states this morning on January 19 his CBG was 150 however when EMS checked him tonight it read "high". He complains of diffuse cramping of his body. At the time of my exam he had already received 2500 mL IV fluids via EMS. [facility] and he was started on gabapentin for his neuropathy. He feels he is having an allergic reaction to it.  [facility]   Past Medical History  Diagnosis Date  . Diabetes mellitus without complication   . CHF (congestive heart failure)    History reviewed. No pertinent past surgical history. Family History  Problem Relation Age of Onset  . Heart attack    . Prostate cancer    . Hypertension     History  Substance Use Topics  . Smoking status: Former Games developermoker  . Smokeless tobacco: Not on file  . Alcohol Use: No  lives alone Applying for disability  Review of Systems  All  other systems reviewed and are negative.     Allergies  Codeine  Home Medications   Prior to Admission medications   Medication Sig Start Date End Date Taking? Authorizing Provider  aspirin EC 81 MG EC tablet Take 1 tablet (81 mg total) by mouth daily. 04/28/14  Yes Erick BlinksJehanzeb Memon, MD  gabapentin (NEURONTIN) 300 MG capsule Take 300 mg by mouth 2 (two) times daily. 07/06/14  Yes Historical Provider, MD  insulin aspart protamine- aspart (NOVOLOG MIX 70/30) (70-30) 100 UNIT/ML injection Inject into the skin.   Yes Historical Provider, MD  insulin NPH-regular (NOVOLIN 70/30) (70-30) 100 UNIT/ML injection Inject 35-45 Units into the skin See admin instructions. Takes 45 units in the morning, and 35 at bedtime    Historical Provider, MD  pravastatin (PRAVACHOL) 40 MG tablet Take 1 tablet (40 mg total) by mouth daily. 04/28/14   Erick BlinksJehanzeb Memon, MD   BP 103/69 mmHg  Pulse 128  Temp(Src) 98 F (36.7 C) (Oral)  Resp 24  Ht 6\' 1"  (1.854 m)  Wt 165 lb (74.844 kg)  BMI 21.77 kg/m2  SpO2 95%  Vital signs normal except for tachycardia and tachypnea  Physical Exam  Constitutional: He is oriented to person, place, and time. He appears well-developed and well-nourished.  Non-toxic appearance. He does not appear ill. No distress.  HENT:  Head: Normocephalic and atraumatic.  Right Ear: External ear normal.  Left Ear: External ear normal.  Nose: Nose normal. No mucosal edema or rhinorrhea.  Mouth/Throat: Oropharynx is clear and moist and mucous membranes are normal. No dental abscesses or uvula swelling.  Dry tongue  Eyes: Conjunctivae and EOM are normal. Pupils are equal, round, and reactive to light.  Neck: Normal range of motion and full passive range of motion without pain. Neck supple.  Cardiovascular: Normal rate, regular rhythm and normal heart sounds.  Exam reveals no gallop and no friction rub.   No murmur heard. Pulmonary/Chest: Breath sounds normal. Tachypnea noted. He is in respiratory  distress. He has no wheezes. He has no rhonchi. He has no rales. He exhibits no tenderness and no crepitus.  Abdominal: Soft. Normal appearance and bowel sounds are normal. He exhibits no distension. There is generalized tenderness. There is no rebound and no guarding.  Musculoskeletal: Normal range of motion. He exhibits no edema or tenderness.  Moves all extremities well.   Neurological: He is alert and oriented to person, place, and time. He has normal strength. No cranial nerve deficit.  Skin: Skin is warm, dry and intact. No rash noted. No erythema. No pallor.  Psychiatric: He has a normal mood and affect. His speech is normal and behavior is normal. His mood appears not anxious.  Nursing note and vitals reviewed.   ED Course  Procedures (including critical care time)  Medications  insulin regular (NOVOLIN R,HUMULIN R) 250 Units in sodium chloride 0.9 % 250 mL (1 Units/mL) infusion (3 Units/hr Intravenous Rate/Dose Change 07/13/14 0530)  0.9 %  sodium chloride infusion (0 mLs Intravenous Stopped 07/13/14 0405)    Followed by  0.9 %  sodium chloride infusion (1,000 mLs Intravenous New Bag/Given 07/13/14 0405)  sodium chloride 0.9 % bolus 1,000 mL (0 mLs Intravenous Stopped 07/13/14 0404)  sodium chloride 0.9 % bolus 1,000 mL (0 mLs Intravenous Stopped 07/13/14 0640)  ondansetron (ZOFRAN) injection 4 mg (4 mg Intravenous Given 07/13/14 0545)  gi cocktail (Maalox,Lidocaine,Donnatal) (30 mLs Oral Given 07/13/14 0646)  sodium chloride 0.9 % bolus 1,000 mL (1,000 mLs Intravenous New Bag/Given 07/13/14 0647)    She was continued on IV fluids and he was given Zofran for his nausea. Patient still appears to be very dehydrated despite getting 2500 mL of fluid. He was started on insulin drip. At my initial assessment I thought he may be in DKA with his complaints of chest pain, abdominal pain, and shortness of breath.  Patient was rechecked at 5 AM. He has had minimal urinary output. He states his  tongue still feels dry. He states his pain is improved and his nausea is gone. Patient's willing to start trying oral fluids. We discussed his test results and that he will most likely be able to do be discharged home later this morning.  About 06:30 nurse reports his BP dropped into the 80's. Pt now c/o heartburn and indicates he has burning in his epigastric area. HR 120. Pt given another liter of NS, GI cocktail and repeat EKG ordered.   07:00 BP 91 systolic, HR 110. States the GI cocktail helped. Discussed getting AP CT scan and he is agreeable  07:18 BP in 80's again. Pt tongue still dry. HR 110. Will give more fluids.   Pt left at change of shift with Dr Deretha Emory at change of shift to get CT results.   Labs Review Results for orders placed or performed during the hospital encounter of 07/13/14  Comprehensive metabolic panel  Result Value Ref Range   Sodium 131 (L) 135 -  145 mmol/L   Potassium 4.3 3.5 - 5.1 mmol/L   Chloride 97 96 - 112 mEq/L   CO2 24 19 - 32 mmol/L   Glucose, Bld 507 (H) 70 - 99 mg/dL   BUN 22 6 - 23 mg/dL   Creatinine, Ser 4.09 0.50 - 1.35 mg/dL   Calcium 7.9 (L) 8.4 - 10.5 mg/dL   Total Protein 6.1 6.0 - 8.3 g/dL   Albumin 3.2 (L) 3.5 - 5.2 g/dL   AST 13 0 - 37 U/L   ALT 16 0 - 53 U/L   Alkaline Phosphatase 84 39 - 117 U/L   Total Bilirubin 0.6 0.3 - 1.2 mg/dL   GFR calc non Af Amer 66 (L) >90 mL/min   GFR calc Af Amer 76 (L) >90 mL/min   Anion gap 10 5 - 15  CBC with Differential  Result Value Ref Range   WBC 8.7 4.0 - 10.5 K/uL   RBC 4.05 (L) 4.22 - 5.81 MIL/uL   Hemoglobin 11.9 (L) 13.0 - 17.0 g/dL   HCT 81.1 (L) 91.4 - 78.2 %   MCV 84.7 78.0 - 100.0 fL   MCH 29.4 26.0 - 34.0 pg   MCHC 34.7 30.0 - 36.0 g/dL   RDW 95.6 21.3 - 08.6 %   Platelets 118 (L) 150 - 400 K/uL   Neutrophils Relative % 92 (H) 43 - 77 %   Neutro Abs 8.0 (H) 1.7 - 7.7 K/uL   Lymphocytes Relative 3 (L) 12 - 46 %   Lymphs Abs 0.3 (L) 0.7 - 4.0 K/uL   Monocytes Relative 4 3 -  12 %   Monocytes Absolute 0.4 0.1 - 1.0 K/uL   Eosinophils Relative 0 0 - 5 %   Eosinophils Absolute 0.0 0.0 - 0.7 K/uL   Basophils Relative 0 0 - 1 %   Basophils Absolute 0.0 0.0 - 0.1 K/uL   Smear Review PLATELETS APPEAR DECREASED   Urinalysis, Routine w reflex microscopic  Result Value Ref Range   Color, Urine YELLOW YELLOW   APPearance CLEAR CLEAR   Specific Gravity, Urine <1.005 (L) 1.005 - 1.030   pH 5.5 5.0 - 8.0   Glucose, UA >1000 (A) NEGATIVE mg/dL   Hgb urine dipstick TRACE (A) NEGATIVE   Bilirubin Urine NEGATIVE NEGATIVE   Ketones, ur 15 (A) NEGATIVE mg/dL   Protein, ur NEGATIVE NEGATIVE mg/dL   Urobilinogen, UA 0.2 0.0 - 1.0 mg/dL   Nitrite NEGATIVE NEGATIVE   Leukocytes, UA NEGATIVE NEGATIVE  Urine microscopic-add on  Result Value Ref Range   Squamous Epithelial / LPF RARE RARE   RBC / HPF 0-2 <3 RBC/hpf   Bacteria, UA RARE RARE  CBG monitoring, ED  Result Value Ref Range   Glucose-Capillary 363 (H) 70 - 99 mg/dL  CBG monitoring, ED  Result Value Ref Range   Glucose-Capillary 272 (H) 70 - 99 mg/dL  I-stat troponin, ED  Result Value Ref Range   Troponin i, poc 0.00 0.00 - 0.08 ng/mL   Comment 3          I-stat Chem 8, ED  Result Value Ref Range   Sodium 136 135 - 145 mmol/L   Potassium 3.6 3.5 - 5.1 mmol/L   Chloride 100 96 - 112 mEq/L   BUN 19 6 - 23 mg/dL   Creatinine, Ser 5.78 0.50 - 1.35 mg/dL   Glucose, Bld 469 (H) 70 - 99 mg/dL   Calcium, Ion 6.29 (L) 1.12 - 1.23 mmol/L   TCO2  19 0 - 100 mmol/L   Hemoglobin 12.9 (L) 13.0 - 17.0 g/dL   HCT 16.1 (L) 09.6 - 04.5 %   Laboratory interpretation all normal except hyperglycemia without acidosis     Imaging Review Dg Chest Port 1 View  07/13/2014   CLINICAL DATA:  Shortness of breath  EXAM: PORTABLE CHEST - 1 VIEW  COMPARISON:  06/26/2013  FINDINGS: There is pulmonary hyperinflation. No edema, consolidation, effusion, or pneumothorax. Normal heart size and mediastinal contours. Remote, healed mid left  clavicle fracture.  IMPRESSION: 1.  No active disease. 2. Suspect COPD.   Electronically Signed   By: Tiburcio Pea M.D.   On: 07/13/2014 04:21     EKG Interpretation   Date/Time:  Wednesday July 13 2014 03:22:36 EST Ventricular Rate:  139 PR Interval:  83 QRS Duration: 80 QT Interval:  360 QTC Calculation: 547 R Axis:   64 Text Interpretation:  Sinus tachycardia Ventricular premature complex  Aberrant complex Borderline repolarization abnormality Prolonged QT  interval Since last tracing T wave inversion now evident in Inferior leads  Confirmed by Prajwal Fellner  MD-I, Michille Mcelrath ([id number]) on 07/13/2014 3:45:07 AM   #2  EKG Interpretation  Date/Time:  Wednesday July 13 2014 06:44:55 EST Ventricular Rate:  118 PR Interval:  98 QRS Duration: 84 QT Interval:  301 QTC Calculation: 422 R Axis:   68 Text Interpretation:  Sinus tachycardia Probable left atrial enlargement Borderline T wave abnormalities Since last tracing of earlier today T wave inversion no longer evident in Lateral leads Confirmed by Ta Fair  MD-I, Latashia Koch ([id number]) on 07/13/2014 6:48:34 AM          MDM   Final diagnoses:  Hyperglycemia  Nausea vomiting and diarrhea  Dehydration  Hypotension, unspecified hypotension type     Disposition pending  Devoria Albe, MD, FACEP   CRITICAL CARE Performed by: Devoria Albe L Total critical care time: 50 min Critical care time was exclusive of separately billable procedures and treating other patients. Critical care was necessary to treat or prevent imminent or life-threatening deterioration. Critical care was time spent personally by me on the following activities: development of treatment plan with patient and/or surrogate as well as nursing, discussions with consultants, evaluation of patient's response to treatment, examination of patient, obtaining history from patient or surrogate, ordering and performing treatments and interventions, ordering and review of laboratory studies,  ordering and review of radiographic studies, pulse oximetry and re-evaluation of patient's condition.     Ward Givens, MD 07/13/14 [phone]

## 2014-07-13 NOTE — ED Notes (Signed)
Upon rounding, insulin drip infusing at 2.1 ml/hr.

## 2014-07-13 NOTE — H&P (Addendum)
[facility] History and Physical  CORWYN VORA ZOX:[id number] DOB: Feb 15, 1966 DOA: 07/13/2014  Referring physician: Emergency Department PCP: No PCP Per Patient  Specialists:   Chief Complaint: Abd pain  HPI: Rickey Smith is a 49 y.o. male  With a hx of type 1 diabetes with previous admission for florid DKA in 11/15 presents to the ED with complaints of generalized abd pain and found to have a blood glucose in the 300's and feeling thirsty. Pt was found to be mildly hypotensive and tachycardic, requiring multiple liters of IVF overnight. Pt noted to have mildly elevated Cr over baseline. No GAP. UA was pos for >1000 glucose with ketones. Pt was continued on IVF, started on insulin gtt and hospitalist service consulted for admission.  On further questioning, pt reports not following up with PCP. States he receives insulin through [place] since insulin there is generic and apparently does not require a prescription. Claims to be checking blood glucose 5 times daily and that sugars have been in the 120's. Last a1c from 11/15 was over 12.  Review of Systems:  Per above, the remainder of the 10pt ros reviewed and are neg  Past Medical History  Diagnosis Date  . Diabetes mellitus without complication   . CHF (congestive heart failure)    History reviewed. No pertinent past surgical history. Social History:  reports that he has quit smoking. He does not have any smokeless tobacco history on file. He reports that he does not drink alcohol or use illicit drugs.  where does patient live--home, ALF, SNF? and with whom if at home?  Can patient participate in ADLs?  Allergies  Allergen Reactions  . Codeine Itching    Family History  Problem Relation Age of Onset  . Heart attack    . Prostate cancer    . Hypertension      (be sure to complete)  Prior to Admission medications   Medication Sig Start Date End Date Taking? Authorizing Provider  aspirin EC 81 MG EC tablet Take 1  tablet (81 mg total) by mouth daily. 04/28/14  Yes Erick Blinks, MD  gabapentin (NEURONTIN) 300 MG capsule Take 300 mg by mouth 2 (two) times daily. 07/06/14  Yes Historical Provider, MD  insulin aspart protamine- aspart (NOVOLOG MIX 70/30) (70-30) 100 UNIT/ML injection Inject 40 Units into the skin 3 (three) times daily.    Yes Historical Provider, MD  pravastatin (PRAVACHOL) 40 MG tablet Take 1 tablet (40 mg total) by mouth daily. 04/28/14   Erick Blinks, MD   Physical Exam: Filed Vitals:   07/13/14 0705 07/13/14 0722 07/13/14 0730 07/13/14 0810  BP: 85/60  90/65 90/59  Pulse: 113  111 106  Temp:  98.9 F (37.2 C)    TempSrc:  Oral    Resp:      Height:      Weight:      SpO2: 99%  97% 99%     General:  Awake, in nad  Eyes: PERRL B  ENT: Membranes dry, dentition fair  Neck: neck supple, trachea midline  Cardiovascular: tachycardic, s1, s2  Respiratory: normal resp effort, no wheezing  Abdomen: soft,nondistended  Skin: perfused, no clubbing  Musculoskeletal: perfused, no clubbing  Psychiatric: mood/affect normal//no auditory/visual hallucinations  Neurologic: cn2-12 grossly intact/strength/sensation intact  Labs on Admission:  Basic Metabolic Panel:  Recent Labs Lab 07/13/14 0405 07/13/14 0703  NA 131* 136  K 4.3 3.6  CL 97 100  CO2 24  --   GLUCOSE 507*  322*  BUN 22 19  CREATININE 1.27 0.90  CALCIUM 7.9*  --    Liver Function Tests:  Recent Labs Lab 07/13/14 0405  AST 13  ALT 16  ALKPHOS 84  BILITOT 0.6  PROT 6.1  ALBUMIN 3.2*   No results for input(s): LIPASE, AMYLASE in the last 168 hours. No results for input(s): AMMONIA in the last 168 hours. CBC:  Recent Labs Lab 07/13/14 0405 07/13/14 0703  WBC 8.7  --   NEUTROABS 8.0*  --   HGB 11.9* 12.9*  HCT 34.3* 38.0*  MCV 84.7  --   PLT 118*  --    Cardiac Enzymes: No results for input(s): CKTOTAL, CKMB, CKMBINDEX, TROPONINI in the last 168 hours.  BNP (last 3 results) No results  for input(s): PROBNP in the last 8760 hours. CBG:  Recent Labs Lab 07/13/14 0525 07/13/14 0631  GLUCAP 363* 272*    Radiological Exams on Admission: Dg Chest Port 1 View  07/13/2014   CLINICAL DATA:  Shortness of breath  EXAM: PORTABLE CHEST - 1 VIEW  COMPARISON:  06/26/2013  FINDINGS: There is pulmonary hyperinflation. No edema, consolidation, effusion, or pneumothorax. Normal heart size and mediastinal contours. Remote, healed mid left clavicle fracture.  IMPRESSION: 1.  No active disease. 2. Suspect COPD.   Electronically Signed   By: Tiburcio PeaJonathan  Watts M.D.   On: 07/13/2014 04:21   Assessment/Plan Principal Problem:   Diabetic hyperosmolar non-ketotic state Active Problems:   Ex-smoker   Hyperglycemia  1. Hyper-osmolar state with type 1 DM 1. Normal serum bicarb and no anion gap 2. Agree with continued insulin gtt 3. Continue with aggressive IVF as tolerated 4. NPO/ice chips until off glucommander 5. Admit to stepdown 6. Follow lytes closely 7. Consult diabetic coordinator 8. Will check a1c. Pt claims to have routine glucose in the 120s prior to admit 2. Prior hx of tobacco abuse 1. Stable 3. DVT prophylaxis 1. Heparin subQ 4. Abd pain 1. Likely secondary to above 2. Insulin gtt and IVF as tolerated 3. Cont analgesics as needed  Code Status: Full  Family Communication: Pt in room Disposition Plan: Admit stepdown  Time spent: 30min  Elga Santy K [facility] Pager [phone]  If 7PM-7AM, please contact night-coverage [email or web address] Password [email or web address] 07/13/2014, 8:40 AM

## 2014-07-13 NOTE — ED Provider Notes (Signed)
Patient turned over to me. Patient arrived with significant tachycardia was not hypotensive at the time but developed hypotension around 6:30 in the morning. Blood pressures were around 87 lowest was 85 systolic currently it's 90. Heart rate is improving heart rate in the low 100s patient without significant abdominal pain. Patient has a history of diabetes not followed by physicians. Patient had onset of vomiting and diarrhea which sounds suggestive of gastroenteritis. No fevers. No leukocytosis. Patient has received a total of 7 L of fluid here and was started on the Glucovance stabilizer. Blood sugars are improving. Although clinically patient seems as if he be terribly dehydrated BUN and creatinine do not reflect that. Patient does have an elevated lactic acid but it is less than 4 to greater than 2. Blood cultures sent no evidence of infection in the lungs or the urine. No evidence of acidosis on his electrolytes. Patient will require admission will discuss with hospitalist. Have withheld antibiotics at this point in time but will discuss with the hospitalist.   Abdomen is not with any significant tenderness CT abdomen pelvis without was ordered at the request of the nighttime physician has been canceled.  Vanetta MuldersScott Milanya Sunderland, MD 07/13/14 [phone]

## 2014-07-13 NOTE — Progress Notes (Signed)
Inpatient Diabetes Program Recommendations  AACE/ADA: New Consensus Statement on Inpatient Glycemic Control (2013)  Target Ranges:  Prepandial:   less than 140 mg/dL      Peak postprandial:   less than 180 mg/dL (1-2 hours)      Critically ill patients:  140 - 180 mg/dL   Results for KELTIN, Rickey Smith (MRN [id number]) as of 07/13/2014 12:26  Ref. Range 07/13/2014 05:25 07/13/2014 06:31 07/13/2014 09:39 07/13/2014 11:18  Glucose-Capillary Latest Range: 70-99 mg/dL 363 (H) 272 (H) 221 (H) 138 (H)    Diabetes history: DM 1 since 2007 Outpatient Diabetes medications: 70/30 insulin 40 units TID Current orders for Inpatient glycemic control: Insulin gtt  Inpatient Diabetes Program Recommendations Insulin - IV drip/GlucoStabilizer: When criteria is met for the patient to be transitioned off IV insulin, please consider the following: Insulin - Basal: Please consider ordering 70/30 insulin 18 units BID (25 units of basal, 11 units meal coverage) Correction (SSI): Please order CBG's ACHS and Novolog 0-9 unitsTID with meals and Novolog 0-5 units QHS for bedtime coverage.  Thanks,  Tama Headings RN, MSN, [facility] Inpatient Diabetes Coordinator Team Pager [phone]

## 2014-07-13 NOTE — ED Notes (Signed)
EDP aware of pt bladder scan volume,v/s, and total IV fluid volume. No new orders given.

## 2014-07-13 NOTE — Care Management Note (Addendum)
    Page 1 of 1   07/14/2014     12:26:08 PM CARE MANAGEMENT NOTE 07/14/2014  [name],Rickey Smith   Account Number:  [id number]  Date Initiated:  07/13/2014  Documentation initiated by:  Kathyrn SheriffHILDRESS,JESSICA  Subjective/Objective Assessment:   Pt admitted for DKA. Pt is from home, lives alone. Pt'Smith mother in room at the time of assessment. Pt is independent at baseline. Pt is uninsured and unemployeed. Pt was recently denied disability.     Action/Plan:   Pt will be referred to the financial counselor. Pt will be reffered to the [facility] [place] [facility] and will have appointment made prior to discharge.   Anticipated DC Date:  07/15/2014   Anticipated DC Plan:  HOME/SELF CARE  In-house referral  Financial Counselor      DC Planning Services  CM consult      Choice offered to / List presented to:             Status of service:  Completed, signed off Medicare Important Message given?   (If response is "NO", the following Medicare IM given date fields will be blank) Date Medicare IM given:   Medicare IM given by:   Date Additional Medicare IM given:   Additional Medicare IM given by:    Discharge Disposition:  HOME/SELF CARE  Per UR Regulation:    If discussed at Long Length of Stay Meetings, dates discussed:    Comments:  07/14/2014 1220 Kathyrn SheriffJessica Childress, RN, MSN, PCCN Pt discharging home with self care. [facility] where pt is recieving medication assistance. Pt has no CM needs at this time.  07/13/2014 1400 Kathyrn SheriffJessica Childress, RN, MSN, PCCN Pt is recieving medication assistance through the health department. Will continue to follow for CM needs.

## 2014-07-13 NOTE — Progress Notes (Signed)
[facility]. Patient has received 7 liters of fluid since 3 AM this morning.

## 2014-07-13 NOTE — ED Notes (Signed)
EDP aware of pt i-stat result. No new orders given at this time.

## 2014-07-13 NOTE — ED Notes (Signed)
ENT reports generalized pain. Accu check over 555. of fluids.

## 2014-07-13 NOTE — Care Management Utilization Note (Signed)
UR completed 

## 2014-07-14 LAB — COMPREHENSIVE METABOLIC PANEL
ALT: 17 U/L (ref 0–53)
AST: 19 U/L (ref 0–37)
Albumin: 2.6 g/dL — ABNORMAL LOW (ref 3.5–5.2)
Alkaline Phosphatase: 48 U/L (ref 39–117)
Anion gap: 3 — ABNORMAL LOW (ref 5–15)
BILIRUBIN TOTAL: 0.4 mg/dL (ref 0.3–1.2)
BUN: 11 mg/dL (ref 6–23)
CALCIUM: 7.9 mg/dL — AB (ref 8.4–10.5)
CHLORIDE: 106 meq/L (ref 96–112)
CO2: 25 mmol/L (ref 19–32)
Creatinine, Ser: 0.75 mg/dL (ref 0.50–1.35)
GFR calc non Af Amer: >90 mL/min (ref >90)
GLUCOSE: 260 mg/dL — AB (ref 70–99)
POTASSIUM: 4.2 mmol/L (ref 3.5–5.1)
Sodium: 134 mmol/L — ABNORMAL LOW (ref 135–145)
Total Protein: 5.1 g/dL — ABNORMAL LOW (ref 6.0–8.3)

## 2014-07-14 LAB — GLUCOSE, CAPILLARY
GLUCOSE-CAPILLARY: 263 mg/dL — AB (ref 70–99)
Glucose-Capillary: 292 mg/dL — ABNORMAL HIGH (ref 70–99)

## 2014-07-14 LAB — CBC
HCT: 32.9 % — ABNORMAL LOW (ref 39.0–52.0)
Hemoglobin: 11.1 g/dL — ABNORMAL LOW (ref 13.0–17.0)
MCH: 29.5 pg (ref 26.0–34.0)
MCHC: 33.7 g/dL (ref 30.0–36.0)
MCV: 87.5 fL (ref 78.0–100.0)
Platelets: 113 10*3/uL — ABNORMAL LOW (ref 150–400)
RBC: 3.76 MIL/uL — ABNORMAL LOW (ref 4.22–5.81)
RDW: 12.7 % (ref 11.5–15.5)
WBC: 8.6 10*3/uL (ref 4.0–10.5)

## 2014-07-14 MED ORDER — INSULIN ASPART PROT & ASPART (70-30 MIX) 100 UNIT/ML ~~LOC~~ SUSP
22.0000 [IU] | Freq: Two times a day (BID) | SUBCUTANEOUS | Status: DC
  Filled 2014-07-14: qty 10

## 2014-07-14 MED ORDER — INSULIN ASPART PROT & ASPART (70-30 MIX) 100 UNIT/ML ~~LOC~~ SUSP: 22.0000 [IU] | Freq: Three times a day (TID) | SUBCUTANEOUS | Status: DC

## 2014-07-14 NOTE — Plan of Care (Signed)
Problem: Phase II Progression Outcomes Goal: Potassium level normalizing Outcome: Progressing Potassium 3.3 tonight, 74mq. Given tonight Goal: Discharge plan established Outcome: Completed/Met Date Met:  07/14/14 To home

## 2014-07-14 NOTE — Care Management Utilization Note (Signed)
UR completed 

## 2014-07-14 NOTE — Discharge Instructions (Signed)
Diabetes Mellitus and Food °It is important for you to manage your blood sugar (glucose) level. Your blood glucose level can be greatly affected by what you eat. Eating healthier foods in the appropriate amounts throughout the day at about the same time each day will help you control your blood glucose level. It can also help slow or prevent worsening of your diabetes mellitus. Healthy eating may even help you improve the level of your blood pressure and reach or maintain a healthy weight.  °HOW CAN FOOD AFFECT ME? °Carbohydrates °Carbohydrates affect your blood glucose level more than any other type of food. Your dietitian will help you determine how many carbohydrates to eat at each meal and teach you how to count carbohydrates. Counting carbohydrates is important to keep your blood glucose at a healthy level, especially if you are using insulin or taking certain medicines for diabetes mellitus. °Alcohol °Alcohol can cause sudden decreases in blood glucose (hypoglycemia), especially if you use insulin or take certain medicines for diabetes mellitus. Hypoglycemia can be a life-threatening condition. Symptoms of hypoglycemia (sleepiness, dizziness, and disorientation) are similar to symptoms of having too much alcohol.  °If your health care provider has given you approval to drink alcohol, do so in moderation and use the following guidelines: °· Women should not have more than one drink per day, and men should not have more than two drinks per day. One drink is equal to: °¨ 12 oz of beer. °¨ 5 oz of wine. °¨ 1½ oz of hard liquor. °· Do not drink on an empty stomach. °· Keep yourself hydrated. Have water, diet soda, or unsweetened iced tea. °· Regular soda, juice, and other mixers might contain a lot of carbohydrates and should be counted. °WHAT FOODS ARE NOT RECOMMENDED? °As you make food choices, it is important to remember that all foods are not the same. Some foods have fewer nutrients per serving than other  foods, even though they might have the same number of calories or carbohydrates. It is difficult to get your body what it needs when you eat foods with fewer nutrients. Examples of foods that you should avoid that are high in calories and carbohydrates but low in nutrients include: °· Trans fats (most processed foods list trans fats on the Nutrition Facts label). °· Regular soda. °· Juice. °· Candy. °· Sweets, such as cake, pie, doughnuts, and cookies. °· Fried foods. °WHAT FOODS CAN I EAT? °Have nutrient-rich foods, which will nourish your body and keep you healthy. The food you should eat also will depend on several factors, including: °· The calories you need. °· The medicines you take. °· Your weight. °· Your blood glucose level. °· Your blood pressure level. °· Your cholesterol level. °You also should eat a variety of foods, including: °· Protein, such as meat, poultry, fish, tofu, nuts, and seeds (lean animal proteins are best). °· Fruits. °· Vegetables. °· Dairy products, such as milk, cheese, and yogurt (low fat is best). °· Breads, grains, pasta, cereal, rice, and beans. °· Fats such as olive oil, trans fat-free margarine, canola oil, avocado, and olives. °DOES EVERYONE WITH DIABETES MELLITUS HAVE THE SAME MEAL PLAN? °Because every person with diabetes mellitus is different, there is not one meal plan that works for everyone. It is very important that you meet with a dietitian who will help you create a meal plan that is just right for you. °Document Released: 03/07/2005 Document Revised: 06/15/2013 Document Reviewed: 05/07/2013 °ExitCare® Patient Information ©2015 [employer]. This   information is not intended to replace advice given to you by your health care provider. Make sure you discuss any questions you have with your health care provider.   Diabetes and Sick Day Management Blood sugar (glucose) can be more difficult to control when you are sick. Colds, fever, flu, nausea, vomiting, and diarrhea  are all examples of common illnesses that can cause problems for people with diabetes. Loss of body fluids (dehydration) from fever, vomiting, diarrhea, infection, and the stress of a sickness can all cause blood glucose levels to increase. Because of this, it is very important to take your diabetes medicines and to eat some form of carbohydrate food when you are sick. Liquid or soft foods are often tolerated, and they help to replace fluids. HOME CARE INSTRUCTIONS These main guidelines are intended for managing a short-term (24 hours or less) sickness:  Take your usual dose of insulin or oral diabetes medicine. An exception would be if you take any form of metformin. If you cannot eat or drink, you can become dehydrated and should not take this medicine.  Continue to take your insulin even if you are unable to eat solid foods or are vomiting. Your insulin dose may stay the same, or it may need to be increased when you are sick.  You will need to test your blood glucose more often, generally every 2-4 hours. If you have type 1 diabetes, test your urine for ketones every 4 hours. If you have type 2 diabetes, test your urine for ketones as directed by your health care provider.  Eat some form of food that contains carbohydrates. The carbohydrates can be in solid or liquid form. You should eat 45-50 g of carbohydrates every 3-4 hours.  Replace fluids if you have a fever, vomit, or have diarrhea. Ask your health care provider for specific rehydration instructions.  Watch carefully for the signs of ketoacidosis if you have type 1 diabetes. Call your health care provider if any of the following symptoms are present, especially in children:  Moderate to large ketones in the urine along with a high blood glucose level.  Severe nausea.  Vomiting.  Diarrhea.  Abdominal pain.  Rapid breathing.  Drink extra liquids that do not contain sugar such as water.  Be careful with over-the-counter  medicines. Read the labels. They may contain sugar or types of sugars that can increase your blood glucose level. Food Choices for Illness All of the food choices below contain about 15 g of carbohydrates. Plan ahead and keep some of these foods around.    to  cup carbonated beverage containing sugar. Carbonated beverages will usually be better tolerated if they are opened and left at room temperature for a few minutes.   of a twin frozen ice pop.   cup regular gelatin.   cup juice.   cup ice cream or frozen yogurt.   cup cooked cereal.   cup sherbet.  1 cup clear broth or soup.  1 cup cream soup.   cup regular custard.   cup regular pudding.  1 cup sports drink.  1 cup plain yogurt.  1 slice toast.  6 squares saltine crackers.  5 vanilla wafers. SEEK MEDICAL CARE IF:   You are unable to drink fluids, even small amounts.  You have nausea and vomiting for more than 6 hours.  You have diarrhea for more than 6 hours.  Your blood glucose level is more than 240 mg/dL, even with additional insulin.  There is a  change in mental status.  You develop an additional serious sickness.  You have been sick for 2 days and are not getting better.  You have a fever. SEEK IMMEDIATE MEDICAL CARE IF:  You have difficulty breathing.  You have moderate to large ketone levels. MAKE SURE YOU:  Understand these instructions.  Will watch your condition.  Will get help right away if you are not doing well or get worse. Document Released: 06/13/2003 Document Revised: 10/25/2013 Document Reviewed: 11/17/2012 [facility] Patient Information 2015 [place]. This information is not intended to replace advice given to you by your health care provider. Make sure you discuss any questions you have with your health care provider.

## 2014-07-14 NOTE — Progress Notes (Addendum)
Inpatient Diabetes Program Recommendations  AACE/ADA: New Consensus Statement on Inpatient Glycemic Control (2013)  Target Ranges:  Prepandial:   less than 140 mg/dL      Peak postprandial:   less than 180 mg/dL (1-2 hours)      Critically ill patients:  140 - 180 mg/dL  Results for Rickey Smith, Phillp S (MRN [id number]) as of 07/14/2014 08:06  Ref. Range 07/14/2014 05:05  Glucose Latest Range: 70-99 mg/dL 829260 (H)   Results for Rickey Smith, [name] (MRN [id number]) as of 07/14/2014 08:06  Ref. Range 07/13/2014 09:39 07/13/2014 11:18 07/13/2014 12:23 07/13/2014 13:36 07/13/2014 14:45 07/13/2014 15:57 07/13/2014 17:11 07/13/2014 21:08  Glucose-Capillary Latest Range: 70-99 mg/dL 784221 (H) 696138 (H) 295166 (H) 201 (H) 128 (H) 131 (H) 119 (H) 297 (H)   Diabetes history: DM 1 since 2007 Outpatient Diabetes medications: 70/30 insulin 40 units TID Current orders for Inpatient glycemic control: 70/30 18 units BID, Novolog 0-15 units TID with meals, Novolog 0-5 units HS  Inpatient Diabetes Program Recommendations Insulin - Basal: Please consider increasing 70/30 to 22 units BID.  07/14/14@12 :41-Spoke with patient about diabetes and home regimen for diabetes control. Patient reports that he does not have a PCP for diabetes management. Patient reports that [facility] and his next appointment is scheduled for sometime in March. Currently he takes 70/30 40 units TID with meals as an outpatient for diabetes control (which is prescribed per the Health Department). Patient reports that he was diagnosed with diabetes in 2007 and is considered a Type 1. Discussed importance of checking CBGs and maintaining good CBG control to prevent long-term and short-term complications. Patient reports that he checks his glucose 4-6 times per day and that his glucose has been in the 100-150 mg/dl range over the past week or two up until he got sick.  Discussed impact of  nutrition, exercise, stress, sickness, and medications on diabetes control. Patient appears to be very knowledge about diabetes. Discussed in detail how high glucose levels damage blood vessels and lead to complications associated with uncontrolled diabetes. Discussed carbohydrates, carbohydrate goals per day and meal, along with portion sizes. Encouraged patient to take insulin as prescribed at time of discharge, to check his glucose and keep a log to take with him to follow up, and to [facility] that case management had made him.  Patient reports that he has everything he needs at home for diabetes management. Patient verbalized understanding of information discussed and he states that he has no further questions at this time related to diabetes.   Thanks, Orlando PennerMarie Sherwin Hollingshed, RN, MSN, CCRN, CDE Diabetes Coordinator Inpatient Diabetes Program [phone] (Team Pager) [phone] ([facility] office) [phone] [facility] office)

## 2014-07-14 NOTE — Discharge Summary (Signed)
Physician Discharge Summary  Rickey Smith:[id number] DOB: 1965/10/04 DOA: 07/13/2014  PCP: No PCP Per Patient  Admit date: 07/13/2014 Discharge date: 07/14/2014  Time spent: 20 minutes  Recommendations for Outpatient Follow-up:  1. Follow up with PCP in 1-2 weeks  Discharge Diagnoses:  Principal Problem:   Diabetic hyperosmolar non-ketotic state Active Problems:   Ex-smoker   Hyperglycemia   Diabetes mellitus with nonketotic hyperosmolarity   Discharge Condition: Improved  Diet recommendation: Diabetic  Filed Weights   07/13/14 0321 07/13/14 0947 07/14/14 0500  Weight: 74.844 kg (165 lb) 73.5 kg (162 lb 0.6 oz) 75.7 kg (166 lb 14.2 oz)    History of present illness:  Please see admit h and p from 1/20 for details. Briefly, pt presented with abd discomfort, found to have elevated glucose and marked dehydration. The patient was admitted for further work up.  Hospital Course:  1. Hyper-osmolar state with type 1 DM 1. Presented with normal serum bicarb and no anion gap 2. Initially on insulin gtt, since weaned off and now on 70/30 insulin, 22 units on discharge 3. Would continue to follow lytes closely 4. Appreciate input by diabetic coordinator 5. A1c of 12.2, was 12.5 on 04/28/14 2. Prior hx of tobacco abuse 1. Stable 3. DVT prophylaxis 1. Heparin subQ 4. Abd pain 1. Likely secondary to above 2. Cont analgesics as needed  Consultations:  Diabetic coordinator  Discharge Exam: Filed Vitals:   07/14/14 0900 07/14/14 1000 07/14/14 1100 07/14/14 1200  BP: 113/75 97/61 89/45  113/65  Pulse: 102 98 89 85  Temp:      TempSrc:      Resp: Height:      Weight:      SpO2: 97% 98% 98% 98%    General: awake, in nad Cardiovascular: regular, s1, s2 Respiratory: normal resp effort, no wheezing  Discharge Instructions     Medication List    TAKE these medications        aspirin 81 MG EC tablet  Take 1 tablet (81 mg total) by mouth daily.     gabapentin 300 MG capsule  Commonly known as:  NEURONTIN  Take 300 mg by mouth 2 (two) times daily.     insulin aspart protamine- aspart (70-30) 100 UNIT/ML injection  Commonly known as:  NOVOLOG MIX 70/30  Inject 0.22 mLs (22 Units total) into the skin 3 (three) times daily.     pravastatin 40 MG tablet  Commonly known as:  PRAVACHOL  Take 1 tablet (40 mg total) by mouth daily.       Allergies  Allergen Reactions  . Codeine Itching   Follow-up Information    Follow up with [facility] On 07/29/2014.   Specialty:  Occupational Therapy   Why:  10AM   Contact information:   [address] [place] [address] [phone]        The results of significant diagnostics from this hospitalization (including imaging, microbiology, ancillary and laboratory) are listed below for reference.    Significant Diagnostic Studies: Dg Chest Port 1 View  07/13/2014   CLINICAL DATA:  Shortness of breath  EXAM: PORTABLE CHEST - 1 VIEW  COMPARISON:  06/26/2013  FINDINGS: There is pulmonary hyperinflation. No edema, consolidation, effusion, or pneumothorax. Normal heart size and mediastinal contours. Remote, healed mid left clavicle fracture.  IMPRESSION: 1.  No active disease. 2. Suspect COPD.   Electronically Signed   By: Audry Riles.D.  On: 07/13/2014 04:21    Microbiology: Recent Results (from the past 240 hour(s))  Culture, blood (routine x 2)     Status: None (Preliminary result)   Collection Time: 07/13/14  7:54 AM  Result Value Ref Range Status   Specimen Description BLOOD LEFT ARM  Final   Special Requests BOTTLES DRAWN AEROBIC AND ANAEROBIC 10CC  Final   Culture NO GROWTH 1 DAY  Final   Report Status PENDING  Incomplete  Culture, blood (routine x 2)     Status: None (Preliminary result)   Collection Time: 07/13/14  8:01 AM  Result Value Ref Range Status   Specimen Description BLOOD RIGHT HAND  Final   Special Requests BOTTLES DRAWN AEROBIC AND  ANAEROBIC 10CC  Final   Culture NO GROWTH 1 DAY  Final   Report Status PENDING  Incomplete  MRSA PCR Screening     Status: None   Collection Time: 07/13/14  9:15 AM  Result Value Ref Range Status   MRSA by PCR NEGATIVE NEGATIVE Final    Comment:        The GeneXpert MRSA Assay (FDA approved for NASAL specimens only), is one component of a comprehensive MRSA colonization surveillance program. It is not intended to diagnose MRSA infection nor to guide or monitor treatment for MRSA infections.      Labs: Basic Metabolic Panel:  Recent Labs Lab 07/13/14 0405 07/13/14 0703 07/13/14 0801 07/13/14 1302 07/13/14 1712 07/14/14 0505  NA 131* 136 135 136 138 134*  K 4.3 3.6 3.2* 4.1 3.3* 4.2  CL 97 100 107 109 108 106  CO2 24  --  23 26 26 25   GLUCOSE 507* 322* 285* 166* 128* 260*  BUN 22 19 18 17 14 11   CREATININE 1.27 0.90 0.94 0.80 0.90 0.75  CALCIUM 7.9*  --  6.9* 7.6* 7.6* 7.9*   Liver Function Tests:  Recent Labs Lab 07/13/14 0405 07/14/14 0505  AST 13 19  ALT 16 17  ALKPHOS 84 48  BILITOT 0.6 0.4  PROT 6.1 5.1*  ALBUMIN 3.2* 2.6*   No results for input(s): LIPASE, AMYLASE in the last 168 hours. No results for input(s): AMMONIA in the last 168 hours. CBC:  Recent Labs Lab 07/13/14 0405 07/13/14 0703 07/14/14 0505  WBC 8.7  --  8.6  NEUTROABS 8.0*  --   --   HGB 11.9* 12.9* 11.1*  HCT 34.3* 38.0* 32.9*  MCV 84.7  --  87.5  PLT 118*  --  113*   Cardiac Enzymes: No results for input(s): CKTOTAL, CKMB, CKMBINDEX, TROPONINI in the last 168 hours. BNP: BNP (last 3 results) No results for input(s): PROBNP in the last 8760 hours. CBG:  Recent Labs Lab 07/13/14 1445 07/13/14 1557 07/13/14 1711 07/13/14 2108 07/14/14 0739  GLUCAP 128* 131* 119* 297* 263*    Signed:  Chrishaun Sasso K  [facility] 07/14/2014, 12:19 PM

## 2014-07-14 NOTE — Progress Notes (Signed)
Patient is being discharged after receiving information from diabetic councilor. Patient is alert and orented able to perform all duties of daily care.

## 2014-07-18 LAB — CULTURE, BLOOD (ROUTINE X 2)
Culture: NO GROWTH
Culture: NO GROWTH

## 2014-07-31 ENCOUNTER — Emergency Department (HOSPITAL_COMMUNITY)
Admission: EM | Admit: 2014-07-31 | Discharge: 2014-08-01 | Disposition: A | Discharge status: Home / Self Care | Payer: Self-pay | Attending: Emergency Medicine | Admitting: Emergency Medicine

## 2014-07-31 ENCOUNTER — Encounter (HOSPITAL_COMMUNITY): Payer: Self-pay | Admitting: Emergency Medicine

## 2014-07-31 ENCOUNTER — Emergency Department (HOSPITAL_COMMUNITY): Payer: Self-pay

## 2014-07-31 DIAGNOSIS — R509 Fever, unspecified: Secondary | ICD-10-CM | POA: Insufficient documentation

## 2014-07-31 DIAGNOSIS — M791 Myalgia, unspecified site: Secondary | ICD-10-CM

## 2014-07-31 DIAGNOSIS — R000 Tachycardia, unspecified: Secondary | ICD-10-CM | POA: Insufficient documentation

## 2014-07-31 DIAGNOSIS — Z87891 Personal history of nicotine dependence: Secondary | ICD-10-CM | POA: Insufficient documentation

## 2014-07-31 DIAGNOSIS — I509 Heart failure, unspecified: Secondary | ICD-10-CM | POA: Insufficient documentation

## 2014-07-31 DIAGNOSIS — Z7982 Long term (current) use of aspirin: Secondary | ICD-10-CM | POA: Insufficient documentation

## 2014-07-31 DIAGNOSIS — Z794 Long term (current) use of insulin: Secondary | ICD-10-CM | POA: Insufficient documentation

## 2014-07-31 DIAGNOSIS — E119 Type 2 diabetes mellitus without complications: Secondary | ICD-10-CM | POA: Insufficient documentation

## 2014-07-31 LAB — CBC WITH DIFFERENTIAL/PLATELET
BASOS PCT: 0 % (ref 0–1)
Basophils Absolute: 0 10*3/uL (ref 0.0–0.1)
EOS PCT: 1 % (ref 0–5)
Eosinophils Absolute: 0.1 10*3/uL (ref 0.0–0.7)
HCT: 39.2 % (ref 39.0–52.0)
Hemoglobin: 13.4 g/dL (ref 13.0–17.0)
Lymphocytes Relative: 4 % — ABNORMAL LOW (ref 12–46)
Lymphs Abs: 0.3 10*3/uL — ABNORMAL LOW (ref 0.7–4.0)
MCH: 29.4 pg (ref 26.0–34.0)
MCHC: 34.2 g/dL (ref 30.0–36.0)
MCV: 86 fL (ref 78.0–100.0)
MONOS PCT: 7 % (ref 3–12)
Monocytes Absolute: 0.6 10*3/uL (ref 0.1–1.0)
NEUTROS PCT: 88 % — AB (ref 43–77)
Neutro Abs: 7.5 10*3/uL (ref 1.7–7.7)
Platelets: 167 10*3/uL (ref 150–400)
RBC: 4.56 MIL/uL (ref 4.22–5.81)
RDW: 12.6 % (ref 11.5–15.5)
WBC: 8.5 10*3/uL (ref 4.0–10.5)

## 2014-07-31 LAB — BASIC METABOLIC PANEL
ANION GAP: 6 (ref 5–15)
BUN: 15 mg/dL (ref 6–23)
CALCIUM: 9.4 mg/dL (ref 8.4–10.5)
CO2: 26 mmol/L (ref 19–32)
CREATININE: 0.93 mg/dL (ref 0.50–1.35)
Chloride: 105 mmol/L (ref 96–112)
Glucose, Bld: 132 mg/dL — ABNORMAL HIGH (ref 70–99)
Potassium: 3.4 mmol/L — ABNORMAL LOW (ref 3.5–5.1)
SODIUM: 137 mmol/L (ref 135–145)

## 2014-07-31 LAB — URINALYSIS, ROUTINE W REFLEX MICROSCOPIC
BILIRUBIN URINE: NEGATIVE
Glucose, UA: 500 mg/dL — AB
KETONES UR: NEGATIVE mg/dL
Leukocytes, UA: NEGATIVE
Nitrite: NEGATIVE
UROBILINOGEN UA: 0.2 mg/dL (ref 0.0–1.0)
pH: 5.5 (ref 5.0–8.0)

## 2014-07-31 LAB — CBG MONITORING, ED: Glucose-Capillary: 133 mg/dL — ABNORMAL HIGH (ref 70–99)

## 2014-07-31 LAB — I-STAT CG4 LACTIC ACID, ED: Lactic Acid, Venous: 2.23 mmol/L (ref 0.5–2.0)

## 2014-07-31 LAB — TROPONIN I

## 2014-07-31 LAB — URINE MICROSCOPIC-ADD ON

## 2014-07-31 MED ORDER — ACETAMINOPHEN 325 MG PO TABS
ORAL_TABLET | ORAL | Status: AC
  Filled 2014-07-31: qty 2

## 2014-07-31 MED ORDER — ACETAMINOPHEN 325 MG PO TABS: 650.0000 mg | ORAL_TABLET | Freq: Once | ORAL | Status: AC

## 2014-07-31 MED ORDER — KETOROLAC TROMETHAMINE 30 MG/ML IJ SOLN
INTRAMUSCULAR | Status: AC
  Filled 2014-07-31: qty 1

## 2014-07-31 MED ORDER — KETOROLAC TROMETHAMINE 30 MG/ML IJ SOLN
30.0000 mg | Freq: Once | INTRAMUSCULAR | Status: AC
  Administered 2014-07-31: 30 mg via INTRAVENOUS

## 2014-07-31 MED ORDER — SODIUM CHLORIDE 0.9 % IV BOLUS (SEPSIS)
2000.0000 mL | Freq: Once | INTRAVENOUS | Status: AC
  Administered 2014-07-31: 2000 mL via INTRAVENOUS

## 2014-07-31 NOTE — ED Notes (Signed)
Pt BP was 91/63 P-99. Pt walked around the nurses station recheck his BP 92/65 P 96.

## 2014-07-31 NOTE — ED Notes (Signed)
Patient reports headache x 3 days. States glucose has been running high today and states he thinks he may be low at the moment. Patient states bilateral leg pain as well, states history of neuropathy. Also reports some confusion.

## 2014-07-31 NOTE — ED Notes (Signed)
When told patient we were concerned that he had an infection and we need to found out the source, pt stated "probably my teeth", the pt stated he has multiple dental caries and abscesses which he has had infections from prior, admits to his teeth "hurting a lot more than usual the last week or so".

## 2014-07-31 NOTE — ED Provider Notes (Signed)
CSN: [id number]     Arrival date & time 07/31/14  1922 History  This chart was scribed for Flint Melter, MD by Roxy Cedar, ED Scribe. This patient was seen in room APA18/[facility]18 and the patient's care was started at 7:42 PM.   Chief Complaint  Patient presents with  . Migraine  . Altered Mental Status   Patient is a 49 y.o. male presenting with altered mental status. The history is provided by the patient. No language interpreter was used.  Altered Mental Status Associated symptoms: headaches     HPI Comments: Rickey Smith is a 49 y.o. male with a PMHx of diabetes and CHF, who presents to the Emergency Department complaining of moderate generalized body aches in legs and hips bilaterally, back and feet with associated dizziness and headache. He states that the headache began 2 days ago and generalized body aches began 4 hours ago. He states that he took tylenol this morning with no relief. Patient states that he took 3 shots of insulin 70/30 today with 55 units each time. He states he usually takes insulin 45/35 twice daily. Patient states he has received flu vaccination for this year. He is currently unemployed and not seen by PCP.   Past Medical History  Diagnosis Date  . Diabetes mellitus without complication   . CHF (congestive heart failure)    History reviewed. No pertinent past surgical history. Family History  Problem Relation Age of Onset  . Heart attack    . Prostate cancer    . Hypertension     History  Substance Use Topics  . Smoking status: Former Games developer  . Smokeless tobacco: Not on file  . Alcohol Use: No   Review of Systems  Neurological: Positive for dizziness and headaches.  All other systems reviewed and are negative.  Allergies  Codeine and Gabapentin  Home Medications   Prior to Admission medications   Medication Sig Start Date End Date Taking? Authorizing Provider  aspirin EC 81 MG EC tablet Take 1 tablet (81 mg total) by mouth daily. 04/28/14   Yes Erick Blinks, MD  insulin aspart protamine- aspart (NOVOLOG MIX 70/30) (70-30) 100 UNIT/ML injection Inject 0.22 mLs (22 Units total) into the skin 3 (three) times daily. 07/14/14  Yes Jerald Kief, MD  pravastatin (PRAVACHOL) 40 MG tablet Take 1 tablet (40 mg total) by mouth daily. Patient not taking: Reported on 07/31/2014 04/28/14   Erick Blinks, MD   Triage Vitals: BP 99/65 mmHg  Pulse 145  Temp(Src) 98.9 F (37.2 C) (Oral)  Resp 22  Ht  (1.88 m)  Wt 165 lb (74.844 kg)  BMI 21.18 kg/m2  SpO2 97%  Physical Exam  Constitutional: He is oriented to person, place, and time. He appears well-developed and well-nourished.  HENT:  Head: Normocephalic and atraumatic.  Right Ear: External ear normal.  Left Ear: External ear normal.  Mouth/Throat: Oropharynx is clear and moist.  Eyes: Conjunctivae and EOM are normal. Pupils are equal, round, and reactive to light.  Neck: Normal range of motion and phonation normal. Neck supple.  No meningismus  Cardiovascular: Regular rhythm and normal heart sounds.  Tachycardia present.   No murmur heard. Pulmonary/Chest: Effort normal and breath sounds normal. He exhibits no bony tenderness.  Abdominal: Soft. There is no tenderness.  Genitourinary:  Mild erythema in groin area without swelling, tenderness or mass.  Musculoskeletal: Normal range of motion.  Neurological: He is alert and oriented to person, place, and time. No cranial  nerve deficit or sensory deficit. He exhibits normal muscle tone. Coordination normal.  Skin: Skin is warm, dry and intact.  Psychiatric: He has a normal mood and affect. His behavior is normal. Judgment and thought content normal.  Nursing note and vitals reviewed.  ED Course  Procedures (including critical care time)  Medications  sodium chloride 0.9 % bolus 2,000 mL (2,000 mLs Intravenous New Bag/Given 07/31/14 2000)  acetaminophen (TYLENOL) tablet 650 mg (0 mg Oral Duplicate 07/31/14 2000)  ketorolac  (TORADOL) 30 MG/ML injection 30 mg (30 mg Intravenous Given 07/31/14 2228)   Patient Vitals for the past 24 hrs:  BP Temp Temp src Pulse Resp SpO2 Height Weight  07/31/14 2230 93/63 mmHg - - 98 21 97 % - -  07/31/14 2225 - 99.4 F (37.4 C) Rectal - - - - -  07/31/14 2200 91/61 mmHg - - 99 20 98 % - -  07/31/14 2130 94/62 mmHg - - 102 19 97 % - -  07/31/14 2100 90/63 mmHg - - 107 20 96 % - -  07/31/14 2030 107/74 mmHg - - 117 16 97 % - -  07/31/14 2027 98/73 mmHg - - 116 18 96 % - -  07/31/14 2000 95/67 mmHg - - (!) 123 15 96 % - -  07/31/14 1953 - 102.6 F (39.2 C) Rectal - - - - -  07/31/14 1941 98/70 mmHg 98.9 F (37.2 C) Oral (!) 123 26 95 % 6\' 2"  (1.88 m) 165 lb (74.844 kg)   DIAGNOSTIC STUDIES: Oxygen Saturation is 97% on RA, normal by my interpretation.    7:47 PM- Discussed plans to order diagnostic imaging of chest, EKG and lab work. Will give patient IV fluids. Pt advised of plan for treatment and pt agrees.  Labs Review Labs Reviewed  BASIC METABOLIC PANEL - Abnormal; Notable for the following:    Potassium 3.4 (*)    Glucose, Bld 132 (*)    All other components within normal limits  CBC WITH DIFFERENTIAL/PLATELET - Abnormal; Notable for the following:    Neutrophils Relative % 88 (*)    Lymphocytes Relative 4 (*)    Lymphs Abs 0.3 (*)    All other components within normal limits  URINALYSIS, ROUTINE W REFLEX MICROSCOPIC - Abnormal; Notable for the following:    Specific Gravity, Urine >1.030 (*)    Glucose, UA 500 (*)    Hgb urine dipstick SMALL (*)    Protein, ur TRACE (*)    All other components within normal limits  CBG MONITORING, ED - Abnormal; Notable for the following:    Glucose-Capillary 133 (*)    All other components within normal limits  I-STAT CG4 LACTIC ACID, ED - Abnormal; Notable for the following:    Lactic Acid, Venous 2.23 (*)    All other components within normal limits  URINE CULTURE  CULTURE, BLOOD (ROUTINE X 2)  CULTURE, BLOOD (ROUTINE X  2)  TROPONIN I  URINE MICROSCOPIC-ADD ON   Imaging Review Dg Chest Port 1 View  07/31/2014   CLINICAL DATA:  Although mental status  EXAM: PORTABLE CHEST - 1 VIEW  COMPARISON:  07/13/2014  FINDINGS: The heart size and mediastinal contours are within normal limits. Both lungs are clear. The visualized skeletal structures are unremarkable.  IMPRESSION: No active disease.   Electronically Signed   By: Christiana PellantGretchen  Green M.D.   On: 07/31/2014 20:07      Date: 07/31/2014- 19:45  Rate: 128  Rhythm: sinus tachycardia  QRS  Axis: normal  PR and QT Intervals: normal  ST/T Wave abnormalities: nonspecific T wave changes  PR and QRS Conduction Disutrbances:none  Narrative Interpretation:   Old EKG Reviewed: changes noted     EKG Interpretation   Date/Time:  Sunday July 31 2014 22:26:28 EST Ventricular Rate:  94 PR Interval:  131 QRS Duration: 90 QT Interval:  331 QTC Calculation: 414 R Axis:   64 Text Interpretation:  Sinus rhythm Left atrial enlargement Borderline T  wave abnormalities Since last tracing rate slower and  T wave abnormality  is less pronounced Confirmed by Effie Shy  MD, Krissia Schreier ([id number]) on 07/31/2014  10:35:07 PM     MDM   Final diagnoses:  Fever, unspecified fever cause  Myalgia    Fever with myalgias, and mild hyperglycemia.  Diabetic patient, with recent history of admission for nonketotic hyperosmolar state.  During that admission, he had similarly low blood pressures as seen tonight.  At discharge his systolic blood pressure was 121.  In emergency department.  He is able to drink, eat and ambulate.  EKG abnormality, without history of coronary artery disease.  Recent cardiac echo, indicates normal ejection fraction.  Patient's myalgias, whole body, today, are not consistent with a concern for coronary artery related chest pain.  I doubt ACS, pericarditis or pneumonia.  There is no evidence for UTI.  Patient states he had flu immunization and Pneumovax, done this year.   He has not currently have a primary care doctor.   Nursing Notes Reviewed/ Care Coordinated, and agree without changes. Applicable Imaging Reviewed.  Interpretation of Laboratory Data incorporated into ED treatment   23:15- Care to Dr. Juleen China to evaluate after patient eats and follow up on soft BP, then determine disposition.  I personally performed the services described in this documentation, which was scribed in my presence. The recorded information has been reviewed and is accurate.    Flint Melter, MD 08/01/14 [phone]

## 2014-07-31 NOTE — ED Notes (Signed)
CRITICAL VALUE ALERT  Critical value received:  Lactic acid  Date of notification:  07/31/14  Time of notification:  2010  Critical value read back:Yes.    Nurse who received alert:  Kiyon Fidalgo  MD notified (1st page):  Effie Shywentz    Time MD responded:  2010

## 2014-08-01 MED ORDER — SODIUM CHLORIDE 0.9 % IV SOLN: INTRAVENOUS | Status: DC

## 2014-08-01 MED ORDER — DIPHENHYDRAMINE HCL 50 MG/ML IJ SOLN: 12.5000 mg | Freq: Once | INTRAMUSCULAR | Status: DC

## 2014-08-01 MED ORDER — MORPHINE SULFATE 4 MG/ML IJ SOLN
6.0000 mg | Freq: Once | INTRAMUSCULAR | Status: DC
Start: 2014-08-01 — End: 2014-08-01

## 2014-08-01 MED ORDER — SODIUM CHLORIDE 0.9 % IV BOLUS (SEPSIS)
1000.0000 mL | Freq: Once | INTRAVENOUS | Status: AC
  Administered 2014-08-01: 1000 mL via INTRAVENOUS

## 2014-08-01 MED ORDER — FENTANYL CITRATE 0.05 MG/ML IJ SOLN
75.0000 ug | Freq: Once | INTRAMUSCULAR | Status: AC
  Administered 2014-08-01: 75 ug via INTRAVENOUS
  Filled 2014-08-01: qty 2

## 2014-08-01 NOTE — ED Notes (Signed)
Pt lying down in bed, resting in bed.

## 2014-08-01 NOTE — ED Notes (Signed)
Breakfast tray ordered 

## 2014-08-01 NOTE — Discharge Instructions (Signed)
Fever, Adult °A fever is a higher than normal body temperature. In an adult, an oral temperature around 98.6° F (37° C) is considered normal. A temperature of 100.4° F (38° C) or higher is generally considered a fever. Mild or moderate fevers generally have no long-term effects and often do not require treatment. Extreme fever (greater than or equal to 106° F or 41.1° C) can cause seizures. The sweating that may occur with repeated or prolonged fever may cause dehydration. Elderly people can develop confusion during a fever. °A measured temperature can vary with: °· Age. °· Time of day. °· Method of measurement (mouth, underarm, rectal, or ear). °The fever is confirmed by taking a temperature with a thermometer. Temperatures can be taken different ways. Some methods are accurate and some are not. °· An oral temperature is used most commonly. Electronic thermometers are fast and accurate. °· An ear temperature will only be accurate if the thermometer is positioned as recommended by the manufacturer. °· A rectal temperature is accurate and done for those adults who have a condition where an oral temperature cannot be taken. °· An underarm (axillary) temperature is not accurate and not recommended. °Fever is a symptom, not a disease.  °CAUSES  °· Infections commonly cause fever. °· Some noninfectious causes for fever include: °· Some arthritis conditions. °· Some thyroid or adrenal gland conditions. °· Some immune system conditions. °· Some types of cancer. °· A medicine reaction. °· High doses of certain street drugs such as methamphetamine. °· Dehydration. °· Exposure to high outside or room temperatures. °· Occasionally, the source of a fever cannot be determined. This is sometimes called a "fever of unknown origin" (FUO). °· Some situations may lead to a temporary rise in body temperature that may go away on its own. Examples are: °· Childbirth. °· Surgery. °· Intense exercise. °HOME CARE INSTRUCTIONS  °· Take  appropriate medicines for fever. Follow dosing instructions carefully. If you use acetaminophen to reduce the fever, be careful to avoid taking other medicines that also contain acetaminophen. Do not take aspirin for a fever if you are younger than age 19. There is an association with Reye's syndrome. Reye's syndrome is a rare but potentially deadly disease. °· If an infection is present and antibiotics have been prescribed, take them as directed. Finish them even if you start to feel better. °· Rest as needed. °· Maintain an adequate fluid intake. To prevent dehydration during an illness with prolonged or recurrent fever, you may need to drink extra fluid. Drink enough fluids to keep your urine clear or pale yellow. °· Sponging or bathing with room temperature water may help reduce body temperature. Do not use ice water or alcohol sponge baths. °· Dress comfortably, but do not over-bundle. °SEEK MEDICAL CARE IF:  °· You are unable to keep fluids down. °· You develop vomiting or diarrhea. °· You are not feeling at least partly better after 3 days. °· You develop new symptoms or problems. °SEEK IMMEDIATE MEDICAL CARE IF:  °· You have shortness of breath or trouble breathing. °· You develop excessive weakness. °· You are dizzy or you faint. °· You are extremely thirsty or you are making little or no urine. °· You develop new pain that was not there before (such as in the head, neck, chest, back, or abdomen). °· You have persistent vomiting and diarrhea for more than 1 to 2 days. °· You develop a stiff neck or your eyes become sensitive to light. °· You develop a   skin rash. °· You have a fever or persistent symptoms for more than 2 to 3 days. °· You have a fever and your symptoms suddenly get worse. °MAKE SURE YOU:  °· Understand these instructions. °· Will watch your condition. °· Will get help right away if you are not doing well or get worse. °Document Released: 12/04/2000 Document Revised: 10/25/2013 Document  Reviewed: 04/11/2011 °ExitCare® Patient Information ©2015 [employer]. This information is not intended to replace advice given to you by your health care provider. Make sure you discuss any questions you have with your health care provider. °Muscle Pain °Muscle pain (myalgia) may be caused by many things, including: °· Overuse or muscle strain, especially if you are not in shape. This is the most common cause of muscle pain. °· Injury. °· Bruises. °· Viruses, such as the flu. °· Infectious diseases. °· Fibromyalgia, which is a chronic condition that causes muscle tenderness, fatigue, and headache. °· Autoimmune diseases, including lupus. °· Certain drugs, including ACE inhibitors and statins. °Muscle pain may be mild or severe. In most cases, the pain lasts only a short time and goes away without treatment. To diagnose the cause of your muscle pain, your health care provider will take your medical history. This means he or she will ask you when your muscle pain began and what has been happening. If you have not had muscle pain for very long, your health care provider may want to wait before doing much testing. If your muscle pain has lasted a long time, your health care provider may want to run tests right away. If your health care provider thinks your muscle pain may be caused by illness, you may need to have additional tests to rule out certain conditions.  °Treatment for muscle pain depends on the cause. Home care is often enough to relieve muscle pain. Your health care provider may also prescribe anti-inflammatory medicine. °HOME CARE INSTRUCTIONS °Watch your condition for any changes. The following actions may help to lessen any discomfort you are feeling: °· Only take over-the-counter or prescription medicines as directed by your health care provider. °· Apply ice to the sore muscle: °¨ Put ice in a plastic bag. °¨ Place a towel between your skin and the bag. °¨ Leave the ice on for 15-20 minutes, 3-4 times  a day. °· You may alternate applying hot and cold packs to the muscle as directed by your health care provider. °· If overuse is causing your muscle pain, slow down your activities until the pain goes away. °¨ Remember that it is normal to feel some muscle pain after starting a workout program. Muscles that have not been used often will be sore at first. °¨ Do regular, gentle exercises if you are not usually active. °¨ Warm up before exercising to lower your risk of muscle pain. °· Do not continue working out if the pain is very bad. Bad pain could mean you have injured a muscle. °SEEK MEDICAL CARE IF: °· Your muscle pain gets worse, and medicines do not help. °· You have muscle pain that lasts longer than 3 days. °· You have a rash or fever along with muscle pain. °· You have muscle pain after a tick bite. °· You have muscle pain while working out, even though you are in good physical condition. °· You have redness, soreness, or swelling along with muscle pain. °· You have muscle pain after starting a new medicine or changing the dose of a medicine. °SEEK IMMEDIATE MEDICAL CARE   IF: °· You have trouble breathing. °· You have trouble swallowing. °· You have muscle pain along with a stiff neck, fever, and vomiting. °· You have severe muscle weakness or cannot move part of your body. °MAKE SURE YOU:  °· Understand these instructions. °· Will watch your condition. °· Will get help right away if you are not doing well or get worse. °Document Released: 05/02/2006 Document Revised: 06/15/2013 Document Reviewed: 04/06/2013 °ExitCare® Patient Information ©2015 [employer]. This information is not intended to replace advice given to you by your health care provider. Make sure you discuss any questions you have with your health care provider. ° °

## 2014-08-01 NOTE — ED Notes (Signed)
Pt will eat breakfast and can be discharged. Pt's mother is elderly and could not come pick him up. Pt was observed overnight.

## 2014-08-01 NOTE — ED Notes (Signed)
Gave patient breakfast tray

## 2014-08-01 NOTE — ED Notes (Signed)
Pt given dt gingerale

## 2014-08-01 NOTE — ED Notes (Signed)
Pt sleeping. 

## 2014-08-02 LAB — URINE CULTURE: Colony Count: 9000

## 2014-08-05 LAB — CULTURE, BLOOD (ROUTINE X 2)
Culture: NO GROWTH
Culture: NO GROWTH

## 2014-08-31 ENCOUNTER — Encounter (HOSPITAL_COMMUNITY): Payer: Self-pay | Admitting: *Deleted

## 2014-08-31 ENCOUNTER — Emergency Department (HOSPITAL_COMMUNITY)
Admission: EM | Admit: 2014-08-31 | Discharge: 2014-09-01 | Disposition: A | Discharge status: Home / Self Care | Payer: Self-pay | Attending: Emergency Medicine | Admitting: Emergency Medicine

## 2014-08-31 DIAGNOSIS — Z794 Long term (current) use of insulin: Secondary | ICD-10-CM | POA: Insufficient documentation

## 2014-08-31 DIAGNOSIS — E119 Type 2 diabetes mellitus without complications: Secondary | ICD-10-CM | POA: Insufficient documentation

## 2014-08-31 DIAGNOSIS — R509 Fever, unspecified: Secondary | ICD-10-CM | POA: Insufficient documentation

## 2014-08-31 DIAGNOSIS — R112 Nausea with vomiting, unspecified: Secondary | ICD-10-CM | POA: Insufficient documentation

## 2014-08-31 DIAGNOSIS — R197 Diarrhea, unspecified: Secondary | ICD-10-CM | POA: Insufficient documentation

## 2014-08-31 DIAGNOSIS — R109 Unspecified abdominal pain: Secondary | ICD-10-CM | POA: Insufficient documentation

## 2014-08-31 DIAGNOSIS — Z87891 Personal history of nicotine dependence: Secondary | ICD-10-CM | POA: Insufficient documentation

## 2014-08-31 DIAGNOSIS — I509 Heart failure, unspecified: Secondary | ICD-10-CM | POA: Insufficient documentation

## 2014-08-31 DIAGNOSIS — Z7982 Long term (current) use of aspirin: Secondary | ICD-10-CM | POA: Insufficient documentation

## 2014-08-31 DIAGNOSIS — R000 Tachycardia, unspecified: Secondary | ICD-10-CM | POA: Insufficient documentation

## 2014-08-31 LAB — CBC WITH DIFFERENTIAL/PLATELET
Basophils Absolute: 0 10*3/uL (ref 0.0–0.1)
Basophils Relative: 0 % (ref 0–1)
Eosinophils Absolute: 0.2 10*3/uL (ref 0.0–0.7)
Eosinophils Relative: 1 % (ref 0–5)
HCT: 46.9 % (ref 39.0–52.0)
Hemoglobin: 15.7 g/dL (ref 13.0–17.0)
Lymphocytes Relative: 2 % — ABNORMAL LOW (ref 12–46)
Lymphs Abs: 0.3 10*3/uL — ABNORMAL LOW (ref 0.7–4.0)
MCH: 28.9 pg (ref 26.0–34.0)
MCHC: 33.5 g/dL (ref 30.0–36.0)
MCV: 86.2 fL (ref 78.0–100.0)
Monocytes Absolute: 0.6 10*3/uL (ref 0.1–1.0)
Monocytes Relative: 4 % (ref 3–12)
Neutro Abs: 12.3 10*3/uL — ABNORMAL HIGH (ref 1.7–7.7)
Neutrophils Relative %: 93 % — ABNORMAL HIGH (ref 43–77)
Platelets: 201 10*3/uL (ref 150–400)
RBC: 5.44 MIL/uL (ref 4.22–5.81)
RDW: 13.4 % (ref 11.5–15.5)
WBC: 13.4 10*3/uL — ABNORMAL HIGH (ref 4.0–10.5)

## 2014-08-31 LAB — COMPREHENSIVE METABOLIC PANEL
ALT: 19 U/L (ref 0–53)
AST: 28 U/L (ref 0–37)
Albumin: 3.9 g/dL (ref 3.5–5.2)
Alkaline Phosphatase: 74 U/L (ref 39–117)
Anion gap: 15 (ref 5–15)
BUN: 19 mg/dL (ref 6–23)
CO2: 22 mmol/L (ref 19–32)
Calcium: 9.5 mg/dL (ref 8.4–10.5)
Chloride: 100 mmol/L (ref 96–112)
Creatinine, Ser: 1.23 mg/dL (ref 0.50–1.35)
GFR calc Af Amer: 79 mL/min — ABNORMAL LOW (ref >90)
GFR calc non Af Amer: 68 mL/min — ABNORMAL LOW (ref >90)
Glucose, Bld: 195 mg/dL — ABNORMAL HIGH (ref 70–99)
Potassium: 4.3 mmol/L (ref 3.5–5.1)
Sodium: 137 mmol/L (ref 135–145)
Total Bilirubin: 0.7 mg/dL (ref 0.3–1.2)
Total Protein: 7.2 g/dL (ref 6.0–8.3)

## 2014-08-31 LAB — URINALYSIS, ROUTINE W REFLEX MICROSCOPIC
Glucose, UA: >1000 mg/dL — AB
Hgb urine dipstick: NEGATIVE
Ketones, ur: 40 mg/dL — AB
Leukocytes, UA: NEGATIVE
Nitrite: NEGATIVE
Protein, ur: NEGATIVE mg/dL
Specific Gravity, Urine: 1.038 — ABNORMAL HIGH (ref 1.005–1.030)
Urobilinogen, UA: 0.2 mg/dL (ref 0.0–1.0)
pH: 5 (ref 5.0–8.0)

## 2014-08-31 LAB — URINE MICROSCOPIC-ADD ON

## 2014-08-31 LAB — CBG MONITORING, ED: Glucose-Capillary: 195 mg/dL — ABNORMAL HIGH (ref 70–99)

## 2014-08-31 LAB — LIPASE, BLOOD: Lipase: 20 U/L (ref 11–59)

## 2014-08-31 MED ORDER — KETOROLAC TROMETHAMINE 15 MG/ML IJ SOLN
15.0000 mg | Freq: Once | INTRAMUSCULAR | Status: AC
Start: 2014-08-31 — End: 2014-08-31
  Administered 2014-08-31: 15 mg via INTRAVENOUS
  Filled 2014-08-31: qty 1

## 2014-08-31 MED ORDER — HYDROMORPHONE HCL 1 MG/ML IJ SOLN
0.5000 mg | Freq: Once | INTRAMUSCULAR | Status: AC
  Administered 2014-08-31: 0.5 mg via INTRAVENOUS
  Filled 2014-08-31: qty 1

## 2014-08-31 MED ORDER — ONDANSETRON HCL 4 MG/2ML IJ SOLN
4.0000 mg | Freq: Once | INTRAMUSCULAR | Status: AC
  Administered 2014-08-31: 4 mg via INTRAVENOUS
  Filled 2014-08-31: qty 2

## 2014-08-31 MED ORDER — SODIUM CHLORIDE 0.9 % IV BOLUS (SEPSIS)
2000.0000 mL | Freq: Once | INTRAVENOUS | Status: AC
  Administered 2014-08-31: 2000 mL via INTRAVENOUS

## 2014-08-31 MED ORDER — ONDANSETRON HCL 4 MG PO TABS: 4.0000 mg | ORAL_TABLET | Freq: Four times a day (QID) | ORAL | Status: DC

## 2014-08-31 NOTE — ED Notes (Signed)
Pt c/o abd pain/N/V/D since Monday night. States that he is an insulin dependent diabetic, states his glucose has been running in the 200's at home.

## 2014-08-31 NOTE — ED Notes (Signed)
Pt CBG, 195. Nurse was notified. 

## 2014-09-01 MED ORDER — HYDROMORPHONE HCL 1 MG/ML IJ SOLN
0.5000 mg | Freq: Once | INTRAMUSCULAR | Status: AC
  Administered 2014-09-01: 0.5 mg via INTRAVENOUS
  Filled 2014-09-01: qty 1

## 2014-09-05 NOTE — ED Provider Notes (Signed)
CSN: [id number]     Arrival date & time 08/31/14  1944 History   First MD Initiated Contact with Patient 08/31/14 2235     Chief Complaint  Patient presents with  . Vomiting  . Diarrhea  . Abdominal Pain     (Consider location/radiation/quality/duration/timing/severity/associated sxs/prior Treatment) HPI  49 year old male with possible, vomiting and diarrhea since this past Monday. Persistent since then. Some crampy abdominal pain. Subjective fever. Patient is diabetic. Reports blood sugars have been in the 200s recently when they normally are better controlled. No blood in stool or emesis. No urinary complaints. No sick contacts. Feels dizziness. No palpitations. No shortness of breath. No cough.  Past Medical History  Diagnosis Date  . Diabetes mellitus without complication   . CHF (congestive heart failure)    History reviewed. No pertinent past surgical history. Family History  Problem Relation Age of Onset  . Heart attack    . Prostate cancer    . Hypertension     History  Substance Use Topics  . Smoking status: Former Games developermoker  . Smokeless tobacco: Not on file  . Alcohol Use: No    Review of Systems  All systems reviewed and negative, other than as noted in HPI.   Allergies  Codeine and Gabapentin  Home Medications   Prior to Admission medications   Medication Sig Start Date End Date Taking? Authorizing Provider  aspirin EC 81 MG EC tablet Take 1 tablet (81 mg total) by mouth daily. 04/28/14  Yes Erick BlinksJehanzeb Memon, MD  insulin aspart protamine- aspart (NOVOLOG MIX 70/30) (70-30) 100 UNIT/ML injection Inject 0.22 mLs (22 Units total) into the skin 3 (three) times daily. 07/14/14  Yes Jerald KiefStephen K Chiu, MD  ondansetron (ZOFRAN) 4 MG tablet Take 1 tablet (4 mg total) by mouth every 6 (six) hours. 08/31/14   Raeford RazorStephen Adrienna Karis, MD   BP 99/64 mmHg  Pulse 70  Temp(Src) 98.9 F (37.2 C) (Oral)  Resp 18  Ht 6\' 1"  (1.854 m)  Wt 160 lb (72.576 kg)  BMI 21.11 kg/m2  SpO2  97% Physical Exam  Constitutional: He appears well-developed and well-nourished. No distress.  HENT:  Head: Normocephalic and atraumatic.  Eyes: Conjunctivae are normal. Right eye exhibits no discharge. Left eye exhibits no discharge.  Neck: Neck supple.  Cardiovascular: Regular rhythm and normal heart sounds.  Exam reveals no gallop and no friction rub.   No murmur heard. Tachycardic  Pulmonary/Chest: Effort normal and breath sounds normal. No respiratory distress.  Abdominal: Soft. He exhibits no distension. There is no tenderness.  Musculoskeletal: He exhibits no edema or tenderness.  Neurological: He is alert.  Skin: Skin is warm and dry.  Psychiatric: He has a normal mood and affect. His behavior is normal. Thought content normal.  Nursing note and vitals reviewed.   ED Course  Procedures (including critical care time) Labs Review Labs Reviewed  CBC WITH DIFFERENTIAL/PLATELET - Abnormal; Notable for the following:    WBC 13.4 (*)    Neutrophils Relative % 93 (*)    Neutro Abs 12.3 (*)    Lymphocytes Relative 2 (*)    Lymphs Abs 0.3 (*)    All other components within normal limits  COMPREHENSIVE METABOLIC PANEL - Abnormal; Notable for the following:    Glucose, Bld 195 (*)    GFR calc non Af Amer 68 (*)    GFR calc Af Amer 79 (*)    All other components within normal limits  URINALYSIS, ROUTINE W REFLEX MICROSCOPIC - Abnormal;  Notable for the following:    Specific Gravity, Urine 1.038 (*)    Glucose, UA >1000 (*)    Bilirubin Urine MODERATE (*)    Ketones, ur 40 (*)    All other components within normal limits  CBG MONITORING, ED - Abnormal; Notable for the following:    Glucose-Capillary 195 (*)    All other components within normal limits  LIPASE, BLOOD  URINE MICROSCOPIC-ADD ON    Imaging Review No results found.   EKG Interpretation None      MDM   Final diagnoses:  Nausea vomiting and diarrhea    49 year old male with nausea, vomiting and  diarrhea. Benign abdominal exam. Suspect illness. Patient symptomatically with improvement of symptoms. Workup fairly unremarkable aside from leukocytosis. This is nonspecific. Doubt serous bacterial illness. I feel he still for discharge at this time. Return precautions were discussed.    Raeford Razor, MD 09/05/14 [phone]

## 2014-10-23 ENCOUNTER — Other Ambulatory Visit (HOSPITAL_COMMUNITY): Payer: Self-pay

## 2014-10-23 ENCOUNTER — Inpatient Hospital Stay (HOSPITAL_COMMUNITY)
Admission: EM | Admit: 2014-10-23 | Discharge: 2014-10-26 | DRG: 638 | Disposition: A | Discharge status: Home / Self Care | Payer: Self-pay | Attending: Internal Medicine | Admitting: Internal Medicine

## 2014-10-23 ENCOUNTER — Other Ambulatory Visit: Payer: Self-pay

## 2014-10-23 ENCOUNTER — Encounter (HOSPITAL_COMMUNITY): Payer: Self-pay | Admitting: Emergency Medicine

## 2014-10-23 DIAGNOSIS — E876 Hypokalemia: Secondary | ICD-10-CM | POA: Diagnosis not present

## 2014-10-23 DIAGNOSIS — D649 Anemia, unspecified: Secondary | ICD-10-CM | POA: Diagnosis present

## 2014-10-23 DIAGNOSIS — D72829 Elevated white blood cell count, unspecified: Secondary | ICD-10-CM | POA: Diagnosis present

## 2014-10-23 DIAGNOSIS — E1040 Type 1 diabetes mellitus with diabetic neuropathy, unspecified: Secondary | ICD-10-CM | POA: Diagnosis present

## 2014-10-23 DIAGNOSIS — E871 Hypo-osmolality and hyponatremia: Secondary | ICD-10-CM | POA: Diagnosis present

## 2014-10-23 DIAGNOSIS — Z8042 Family history of malignant neoplasm of prostate: Secondary | ICD-10-CM

## 2014-10-23 DIAGNOSIS — E872 Acidosis, unspecified: Secondary | ICD-10-CM | POA: Diagnosis present

## 2014-10-23 DIAGNOSIS — K219 Gastro-esophageal reflux disease without esophagitis: Secondary | ICD-10-CM

## 2014-10-23 DIAGNOSIS — Z8249 Family history of ischemic heart disease and other diseases of the circulatory system: Secondary | ICD-10-CM

## 2014-10-23 DIAGNOSIS — Z794 Long term (current) use of insulin: Secondary | ICD-10-CM

## 2014-10-23 DIAGNOSIS — I509 Heart failure, unspecified: Secondary | ICD-10-CM | POA: Diagnosis present

## 2014-10-23 DIAGNOSIS — E0810 Diabetes mellitus due to underlying condition with ketoacidosis without coma: Secondary | ICD-10-CM

## 2014-10-23 DIAGNOSIS — Z87891 Personal history of nicotine dependence: Secondary | ICD-10-CM

## 2014-10-23 DIAGNOSIS — E875 Hyperkalemia: Secondary | ICD-10-CM | POA: Diagnosis present

## 2014-10-23 DIAGNOSIS — R1013 Epigastric pain: Secondary | ICD-10-CM

## 2014-10-23 DIAGNOSIS — I10 Essential (primary) hypertension: Secondary | ICD-10-CM | POA: Diagnosis present

## 2014-10-23 DIAGNOSIS — IMO0002 Reserved for concepts with insufficient information to code with codable children: Secondary | ICD-10-CM | POA: Diagnosis present

## 2014-10-23 DIAGNOSIS — E1065 Type 1 diabetes mellitus with hyperglycemia: Secondary | ICD-10-CM

## 2014-10-23 DIAGNOSIS — E1010 Type 1 diabetes mellitus with ketoacidosis without coma: Principal | ICD-10-CM | POA: Diagnosis present

## 2014-10-23 LAB — URINALYSIS, ROUTINE W REFLEX MICROSCOPIC
Bilirubin Urine: NEGATIVE
Glucose, UA: 500 mg/dL — AB
HGB URINE DIPSTICK: NEGATIVE
Ketones, ur: >80 mg/dL — AB
Leukocytes, UA: NEGATIVE
Nitrite: NEGATIVE
Protein, ur: NEGATIVE mg/dL
UROBILINOGEN UA: 0.2 mg/dL (ref 0.0–1.0)
pH: 5.5 (ref 5.0–8.0)

## 2014-10-23 LAB — BASIC METABOLIC PANEL
ANION GAP: 7 (ref 5–15)
Anion gap: 10 (ref 5–15)
Anion gap: 5 (ref 5–15)
BUN: 21 mg/dL — ABNORMAL HIGH (ref 6–20)
BUN: 19 mg/dL (ref 6–20)
BUN: 15 mg/dL (ref 6–20)
CHLORIDE: 111 mmol/L (ref 101–111)
CO2: 12 mmol/L — ABNORMAL LOW (ref 22–32)
CO2: 15 mmol/L — AB (ref 22–32)
CO2: 19 mmol/L — ABNORMAL LOW (ref 22–32)
CREATININE: 0.82 mg/dL (ref 0.61–1.24)
Calcium: 7.7 mg/dL — ABNORMAL LOW (ref 8.9–10.3)
Calcium: 7.7 mg/dL — ABNORMAL LOW (ref 8.9–10.3)
Calcium: 8 mg/dL — ABNORMAL LOW (ref 8.9–10.3)
Chloride: 112 mmol/L — ABNORMAL HIGH (ref 101–111)
Chloride: 108 mmol/L (ref 101–111)
Creatinine, Ser: 1.14 mg/dL (ref 0.61–1.24)
Creatinine, Ser: 0.93 mg/dL (ref 0.61–1.24)
GFR calc Af Amer: >60 mL/min (ref >60)
GFR calc non Af Amer: >60 mL/min (ref >60)
GFR calc non Af Amer: >60 mL/min (ref >60)
GLUCOSE: 258 mg/dL — AB (ref 70–99)
Glucose, Bld: 209 mg/dL — ABNORMAL HIGH (ref 70–99)
Glucose, Bld: 160 mg/dL — ABNORMAL HIGH (ref 70–99)
POTASSIUM: 4.9 mmol/L (ref 3.5–5.1)
Potassium: 4.6 mmol/L (ref 3.5–5.1)
Potassium: 3.9 mmol/L (ref 3.5–5.1)
Sodium: 134 mmol/L — ABNORMAL LOW (ref 135–145)
Sodium: 133 mmol/L — ABNORMAL LOW (ref 135–145)
Sodium: 132 mmol/L — ABNORMAL LOW (ref 135–145)

## 2014-10-23 LAB — COMPREHENSIVE METABOLIC PANEL
ALT: 23 U/L (ref 17–63)
AST: 18 U/L (ref 15–41)
Albumin: 4.6 g/dL (ref 3.5–5.0)
Alkaline Phosphatase: 97 U/L (ref 38–126)
Anion gap: 28 — ABNORMAL HIGH (ref 5–15)
BILIRUBIN TOTAL: 2 mg/dL — AB (ref 0.3–1.2)
BUN: 25 mg/dL — ABNORMAL HIGH (ref 6–20)
CALCIUM: 9.2 mg/dL (ref 8.9–10.3)
CO2: 9 mmol/L — AB (ref 22–32)
CREATININE: 1.47 mg/dL — AB (ref 0.61–1.24)
Chloride: 96 mmol/L — ABNORMAL LOW (ref 101–111)
GFR calc Af Amer: >60 mL/min (ref >60)
GFR, EST NON AFRICAN AMERICAN: 55 mL/min — AB (ref >60)
GLUCOSE: 585 mg/dL — AB (ref 70–99)
Potassium: 5.8 mmol/L — ABNORMAL HIGH (ref 3.5–5.1)
Sodium: 133 mmol/L — ABNORMAL LOW (ref 135–145)
Total Protein: 8.5 g/dL — ABNORMAL HIGH (ref 6.5–8.1)

## 2014-10-23 LAB — GLUCOSE, CAPILLARY
GLUCOSE-CAPILLARY: 161 mg/dL — AB (ref 70–99)
GLUCOSE-CAPILLARY: 153 mg/dL — AB (ref 70–99)
Glucose-Capillary: 298 mg/dL — ABNORMAL HIGH (ref 70–99)
Glucose-Capillary: 258 mg/dL — ABNORMAL HIGH (ref 70–99)
Glucose-Capillary: 242 mg/dL — ABNORMAL HIGH (ref 70–99)
Glucose-Capillary: 179 mg/dL — ABNORMAL HIGH (ref 70–99)
Glucose-Capillary: 199 mg/dL — ABNORMAL HIGH (ref 70–99)
Glucose-Capillary: 165 mg/dL — ABNORMAL HIGH (ref 70–99)
Glucose-Capillary: 148 mg/dL — ABNORMAL HIGH (ref 70–99)
Glucose-Capillary: 188 mg/dL — ABNORMAL HIGH (ref 70–99)
Glucose-Capillary: 111 mg/dL — ABNORMAL HIGH (ref 70–99)

## 2014-10-23 LAB — CBC WITH DIFFERENTIAL/PLATELET
BASOS PCT: 0 % (ref 0–1)
Basophils Absolute: 0 10*3/uL (ref 0.0–0.1)
EOS PCT: 0 % (ref 0–5)
Eosinophils Absolute: 0 10*3/uL (ref 0.0–0.7)
HEMATOCRIT: 45.3 % (ref 39.0–52.0)
HEMOGLOBIN: 15.1 g/dL (ref 13.0–17.0)
LYMPHS PCT: 5 % — AB (ref 12–46)
Lymphs Abs: 1.1 10*3/uL (ref 0.7–4.0)
MCH: 30.1 pg (ref 26.0–34.0)
MCHC: 33.3 g/dL (ref 30.0–36.0)
MCV: 90.2 fL (ref 78.0–100.0)
Monocytes Absolute: 1.1 10*3/uL — ABNORMAL HIGH (ref 0.1–1.0)
Monocytes Relative: 5 % (ref 3–12)
Neutro Abs: 20.2 10*3/uL — ABNORMAL HIGH (ref 1.7–7.7)
Neutrophils Relative %: 90 % — ABNORMAL HIGH (ref 43–77)
Platelets: 353 10*3/uL (ref 150–400)
RBC: 5.02 MIL/uL (ref 4.22–5.81)
RDW: 13.3 % (ref 11.5–15.5)
WBC: 22.4 10*3/uL — AB (ref 4.0–10.5)

## 2014-10-23 LAB — CBG MONITORING, ED
GLUCOSE-CAPILLARY: 498 mg/dL — AB (ref 70–99)
GLUCOSE-CAPILLARY: 502 mg/dL — AB (ref 70–99)
GLUCOSE-CAPILLARY: 395 mg/dL — AB (ref 70–99)
Glucose-Capillary: >600 mg/dL (ref 70–99)

## 2014-10-23 LAB — MRSA PCR SCREENING: MRSA BY PCR: NEGATIVE

## 2014-10-23 LAB — LIPASE, BLOOD: Lipase: 26 U/L (ref 22–51)

## 2014-10-23 LAB — MAGNESIUM: Magnesium: 2.5 mg/dL — ABNORMAL HIGH (ref 1.7–2.4)

## 2014-10-23 MED ORDER — ACETAMINOPHEN 325 MG PO TABS
650.0000 mg | ORAL_TABLET | Freq: Four times a day (QID) | ORAL | Status: DC | PRN
  Administered 2014-10-23 (×2): 650 mg via ORAL
  Filled 2014-10-23 (×3): qty 2

## 2014-10-23 MED ORDER — SODIUM CHLORIDE 0.9 % IV SOLN
INTRAVENOUS | Status: DC
  Administered 2014-10-23 (×2): via INTRAVENOUS

## 2014-10-23 MED ORDER — SODIUM CHLORIDE 0.9 % IJ SOLN
3.0000 mL | Freq: Two times a day (BID) | INTRAMUSCULAR | Status: DC
  Administered 2014-10-23 – 2014-10-25 (×3): 3 mL via INTRAVENOUS

## 2014-10-23 MED ORDER — PANTOPRAZOLE SODIUM 40 MG IV SOLR
40.0000 mg | Freq: Once | INTRAVENOUS | Status: AC
  Administered 2014-10-23: 40 mg via INTRAVENOUS
  Filled 2014-10-23: qty 40

## 2014-10-23 MED ORDER — HEPARIN SODIUM (PORCINE) 5000 UNIT/ML IJ SOLN: 5000.0000 [IU] | Freq: Three times a day (TID) | INTRAMUSCULAR | Status: DC

## 2014-10-23 MED ORDER — SODIUM CHLORIDE 0.9 % IV BOLUS (SEPSIS)
1000.0000 mL | Freq: Once | INTRAVENOUS | Status: AC
  Administered 2014-10-23: 1000 mL via INTRAVENOUS

## 2014-10-23 MED ORDER — INSULIN GLARGINE 100 UNIT/ML ~~LOC~~ SOLN
10.0000 [IU] | Freq: Every day | SUBCUTANEOUS | Status: DC
  Administered 2014-10-23: 10 [IU] via SUBCUTANEOUS
  Filled 2014-10-23: qty 0.1

## 2014-10-23 MED ORDER — SODIUM CHLORIDE 0.9 % IV SOLN: INTRAVENOUS | Status: DC

## 2014-10-23 MED ORDER — DEXTROSE-NACL 5-0.45 % IV SOLN
INTRAVENOUS | Status: DC
  Administered 2014-10-23 (×2): via INTRAVENOUS

## 2014-10-23 MED ORDER — ACETAMINOPHEN 650 MG RE SUPP: 650.0000 mg | Freq: Four times a day (QID) | RECTAL | Status: DC | PRN

## 2014-10-23 MED ORDER — INSULIN ASPART 100 UNIT/ML ~~LOC~~ SOLN: 0.0000 [IU] | Freq: Three times a day (TID) | SUBCUTANEOUS | Status: DC

## 2014-10-23 MED ORDER — AMITRIPTYLINE HCL 25 MG PO TABS
50.0000 mg | ORAL_TABLET | Freq: Every day | ORAL | Status: DC
  Administered 2014-10-23 – 2014-10-25 (×3): 50 mg via ORAL
  Filled 2014-10-23 (×3): qty 2

## 2014-10-23 MED ORDER — CETYLPYRIDINIUM CHLORIDE 0.05 % MT LIQD
7.0000 mL | Freq: Two times a day (BID) | OROMUCOSAL | Status: DC
  Administered 2014-10-23 – 2014-10-25 (×4): 7 mL via OROMUCOSAL

## 2014-10-23 MED ORDER — DEXTROSE-NACL 5-0.45 % IV SOLN: INTRAVENOUS | Status: DC

## 2014-10-23 MED ORDER — INSULIN ASPART 100 UNIT/ML ~~LOC~~ SOLN
0.0000 [IU] | Freq: Every day | SUBCUTANEOUS | Status: DC
Start: 2014-10-23 — End: 2014-10-24

## 2014-10-23 MED ORDER — SODIUM CHLORIDE 0.9 % IV SOLN
INTRAVENOUS | Status: DC
  Administered 2014-10-23: 4.4 [IU]/h via INTRAVENOUS
  Filled 2014-10-23: qty 2.5

## 2014-10-23 MED ORDER — HEPARIN SODIUM (PORCINE) 5000 UNIT/ML IJ SOLN
5000.0000 [IU] | Freq: Three times a day (TID) | INTRAMUSCULAR | Status: DC
  Administered 2014-10-23 – 2014-10-26 (×9): 5000 [IU] via SUBCUTANEOUS
  Filled 2014-10-23 (×9): qty 1

## 2014-10-23 MED ORDER — ONDANSETRON HCL 4 MG PO TABS
4.0000 mg | ORAL_TABLET | Freq: Four times a day (QID) | ORAL | Status: DC | PRN
  Administered 2014-10-23: 4 mg via ORAL
  Filled 2014-10-23: qty 1

## 2014-10-23 MED ORDER — HYDROMORPHONE HCL 1 MG/ML IJ SOLN
1.0000 mg | Freq: Once | INTRAMUSCULAR | Status: AC
  Administered 2014-10-23: 1 mg via INTRAVENOUS
  Filled 2014-10-23: qty 1

## 2014-10-23 MED ORDER — ONDANSETRON HCL 4 MG/2ML IJ SOLN
4.0000 mg | Freq: Four times a day (QID) | INTRAMUSCULAR | Status: DC | PRN
  Administered 2014-10-23 – 2014-10-24 (×2): 4 mg via INTRAVENOUS
  Filled 2014-10-23 (×3): qty 2

## 2014-10-23 MED ORDER — ONDANSETRON HCL 4 MG/2ML IJ SOLN
4.0000 mg | Freq: Once | INTRAMUSCULAR | Status: AC
  Administered 2014-10-23: 4 mg via INTRAVENOUS
  Filled 2014-10-23: qty 2

## 2014-10-23 MED ORDER — ONDANSETRON HCL 4 MG/2ML IJ SOLN: 4.0000 mg | Freq: Three times a day (TID) | INTRAMUSCULAR | Status: DC | PRN

## 2014-10-23 MED ORDER — SODIUM CHLORIDE 0.9 % IV SOLN
1000.0000 mL | Freq: Once | INTRAVENOUS | Status: AC
  Administered 2014-10-23: 1000 mL via INTRAVENOUS

## 2014-10-23 MED ORDER — SODIUM CHLORIDE 0.9 % IV SOLN
INTRAVENOUS | Status: DC
  Filled 2014-10-23: qty 2.5

## 2014-10-23 MED ORDER — SODIUM CHLORIDE 0.9 % IV SOLN: INTRAVENOUS | Status: AC

## 2014-10-23 MED ORDER — SODIUM CHLORIDE 0.9 % IV SOLN
INTRAVENOUS | Status: DC
  Administered 2014-10-23: 13:00:00 via INTRAVENOUS

## 2014-10-23 MED ORDER — POLYETHYLENE GLYCOL 3350 17 G PO PACK: 17.0000 g | PACK | Freq: Every day | ORAL | Status: DC | PRN

## 2014-10-23 MED ORDER — SODIUM CHLORIDE 0.9 % IV SOLN
INTRAVENOUS | Status: DC
  Administered 2014-10-23: via INTRAVENOUS

## 2014-10-23 NOTE — H&P (Signed)
[facility] History and Physical  Rickey HooseWilliam S Smith ZOX:[id number] DOB: 04/05/1966 DOA: 10/23/2014  Referring physician: Dr. Effie ShyWentz PCP: No PCP Per Patient   Chief Complaint: Nausea vomiting and abd pain  HPI: Rickey Smith is a 49 y.o. male with past history of type 1 diabetes mellitus discharged from the hospital on January for DKA comes in for nausea and vomiting and abdominal pain that started 3 days prior to admission. He relates he is not taking his insulin regularly. He denies any cuts or sores, any fever, chills, diarrhea or sick contacts. He thought he could take his insulin at home and be better when he started to have a concurrent keep anything down he decided to stop his insulin. And was brought to the emergency room by EMS.   In the ED: He was found to have a sodium 06/27/2003 0.8 chloride of 96 and bicarbonate of 9 new acute renal failure 1.4 (with a baseline 1.0) a white count of 22, blood glucose in the 500s UA does not show any signs of infection and EKG as below. So we are consulted for further evaluation.\  Review of Systems:  Constitutional:  No weight loss, night sweats, Fevers, chills, fatigue.  HEENT:  No headaches, Difficulty swallowing,Tooth/dental problems,Sore throat,  No sneezing, itching, ear ache, nasal congestion, post nasal drip,  Cardio-vascular:  No chest pain, Orthopnea, PND, swelling in lower extremities, anasarca, dizziness, palpitations  GI:  No heartburn, indigestion, change in bowel habits, loss of appetite  Resp:  No shortness of breath with exertion or at rest. No excess mucus, no productive cough, No non-productive cough, No coughing up of blood.No change in color of mucus.No wheezing.No chest wall deformity  Skin:  no rash or lesions.  GU:  no dysuria, change in color of urine, no urgency or frequency. No flank pain.  Musculoskeletal:  No joint pain or swelling. No decreased range of motion. No back pain.  Psych:  No change in mood  or affect. No depression or anxiety. No memory loss.   Past Medical History  Diagnosis Date  . Diabetes mellitus without complication   . CHF (congestive heart failure)    History reviewed. No pertinent past surgical history. Social History:  reports that he has quit smoking. His smoking use included Cigarettes. He has a 20 pack-year smoking history. He has never used smokeless tobacco. He reports that he does not drink alcohol or use illicit drugs.  Allergies  Allergen Reactions  . Codeine Itching  . Gabapentin Diarrhea and Nausea And Vomiting    Family History  Problem Relation Age of Onset  . Heart attack    . Prostate cancer    . Hypertension    . Hypertension Mother   . Cancer Father    blank  Prior to Admission medications   Medication Sig Start Date End Date Taking? Authorizing Provider  amitriptyline (ELAVIL) 50 MG tablet Take 50 mg by mouth at bedtime.   Yes Historical Provider, MD  aspirin EC 81 MG EC tablet Take 1 tablet (81 mg total) by mouth daily. 04/28/14  Yes Erick BlinksJehanzeb Memon, MD  insulin aspart protamine- aspart (NOVOLOG MIX 70/30) (70-30) 100 UNIT/ML injection Inject 0.22 mLs (22 Units total) into the skin 3 (three) times daily. Patient taking differently: Inject 40-45 Units into the skin 2 (two) times daily with a meal. 45 units in the morning and 40 units in the evening. 07/14/14  Yes Jerald KiefStephen K Chiu, MD  ondansetron (ZOFRAN) 4 MG tablet Take 1  tablet (4 mg total) by mouth every 6 (six) hours. Patient not taking: Reported on 10/23/2014 08/31/14   Raeford Razor, MD   Physical Exam: Filed Vitals:   10/23/14 1000 10/23/14 1030 10/23/14 1100 10/23/14 1115  BP: 146/105 158/76 167/88   Pulse: 142 144 145 144  Temp:      TempSrc:      Resp: 29 32    Height:      Weight:      SpO2: 100% 98% 97% 100%    Wt Readings from Last 3 Encounters:  10/23/14 72.576 kg (160 lb)  08/31/14 72.576 kg (160 lb)  07/31/14 74.844 kg (165 lb)    General:  Appears calm and  comfortable, extremely thin cachectic Eyes: PERRL, normal lids, irises & conjunctiva ENT: grossly normal hearing, lips & tongue Neck: no LAD, masses or thyromegaly Cardiovascular: RRR, no m/r/g. No LE edema. Telemetry: SR, no arrhythmias  Respiratory: CTA bilaterally, no w/r/r. Normal respiratory effort. Abdomen: soft, ntnd Skin: no rash or induration seen on limited exam Musculoskeletal: grossly normal tone BUE/BLE Psychiatric: grossly normal mood and affect, speech fluent and appropriate Neurologic: grossly non-focal.          Labs on Admission:  Basic Metabolic Panel:  Recent Labs Lab 10/23/14 0824  NA 133*  K 5.8*  CL 96*  CO2 9*  GLUCOSE 585*  BUN 25*  CREATININE 1.47*  CALCIUM 9.2   Liver Function Tests:  Recent Labs Lab 10/23/14 0824  AST 18  ALT 23  ALKPHOS 97  BILITOT 2.0*  PROT 8.5*  ALBUMIN 4.6    Recent Labs Lab 10/23/14 0824  LIPASE 26   No results for input(s): AMMONIA in the last 168 hours. CBC:  Recent Labs Lab 10/23/14 0824  WBC 22.4*  NEUTROABS 20.2*  HGB 15.1  HCT 45.3  MCV 90.2  PLT 353   Cardiac Enzymes: No results for input(s): CKTOTAL, CKMB, CKMBINDEX, TROPONINI in the last 168 hours.  BNP (last 3 results) No results for input(s): BNP in the last 8760 hours.  ProBNP (last 3 results) No results for input(s): PROBNP in the last 8760 hours.  CBG:  Recent Labs Lab 10/23/14 0758 10/23/14 0955 10/23/14 1101  GLUCAP >600* 498* 502*    Radiological Exams on Admission: No results found.  EKG: Independently reviewed. Sinus tachycardia with left atrial enlargement.  Assessment/Plan Diabetic ketoacidosis without coma associated with type 1 diabetes mellitus/ Hyponatremia/  Metabolic acidosis: - As mentioned above he has missed his insulin which is most likely the cause of his DKA. - He got a liter of IV fluids with EMS 2 L in the ED, he has put out about 700 mL given 2 additional liters of normal saline, then continue  him on 125. - Start him on the gluco-commander, and long-acting insulin. I will not stopped the IV insulin until his anion gap is close and his bicarbonate is greater than 20. - Monitor showed a cousin-check an A1c, he denies any chest pain any cuts or sores. No new medications. He denies any alcohol abuse. We'll monitor him with CIWA protocol. - His hyponatremia is likely due to prerenal etiology, new correct for the glucose he is actually close to hypernatremia so we'll check a basic metabolic panel every 4 hours.  Hyperkalemia: This probably due to acidosis as his acidosis resolved his hyperkalemia will improve.  Leukocytosis: This most likely stress emargination due to DKA and severity of illness. Has remained afebrile we'll continue to monitor fever curve. I  will not start empiric antibiotics at this time. As there is no clear source.   Code Status: full DVT Prophylaxis:heparin Family Communication: none Disposition Plan: inpatient  Time spent: 85 min  Marinda Elk [facility] Pager [phone]

## 2014-10-23 NOTE — ED Provider Notes (Signed)
CSN: [id number]     Arrival date & time 10/23/14  0755 History   First MD Initiated Contact with Patient 10/23/14 0759     Chief Complaint  Patient presents with  . Abdominal Pain     (Consider location/radiation/quality/duration/timing/severity/associated sxs/prior Treatment) HPI   Rickey Smith is a 49 y.o. male with h/o diabetes, who presents to the Emergency Department complaining of upper abdominal pain with nausea and vomiting that began yesterday.  Describes a sharp pain to the upper abdomen.  He reports multiple episodes of vomiting since onset and unable to keep down any food or fluids.  Abdominal pain is similar to previous.  He states his last insulin dose was yesterday.  He denies fever, chest pain, shortness of breath or diarrhea.  Pt denies alcohol use.  Past Medical History  Diagnosis Date  . Diabetes mellitus without complication   . CHF (congestive heart failure)    History reviewed. No pertinent past surgical history. Family History  Problem Relation Age of Onset  . Heart attack    . Prostate cancer    . Hypertension     History  Substance Use Topics  . Smoking status: Former Smoker -- 1.00 packs/day for 20 years    Types: Cigarettes  . Smokeless tobacco: Never Used  . Alcohol Use: No    Review of Systems  Constitutional: Positive for appetite change. Negative for fever, chills and diaphoresis.  HENT: Negative for trouble swallowing.   Respiratory: Negative for shortness of breath.   Cardiovascular: Negative for chest pain.  Gastrointestinal: Positive for nausea, vomiting and abdominal pain. Negative for diarrhea and blood in stool.  Genitourinary: Negative for dysuria, flank pain, decreased urine volume and difficulty urinating.  Musculoskeletal: Negative for back pain and neck pain.  Skin: Negative for color change and rash.  Neurological: Negative for dizziness, syncope, weakness and numbness.  Hematological: Negative for adenopathy.   Psychiatric/Behavioral: Negative for decreased concentration.  All other systems reviewed and are negative.     Allergies  Codeine and Gabapentin  Home Medications   Prior to Admission medications   Medication Sig Start Date End Date Taking? Authorizing Provider  aspirin EC 81 MG EC tablet Take 1 tablet (81 mg total) by mouth daily. 04/28/14   Erick BlinksJehanzeb Memon, MD  insulin aspart protamine- aspart (NOVOLOG MIX 70/30) (70-30) 100 UNIT/ML injection Inject 0.22 mLs (22 Units total) into the skin 3 (three) times daily. 07/14/14   Jerald KiefStephen K Chiu, MD  ondansetron (ZOFRAN) 4 MG tablet Take 1 tablet (4 mg total) by mouth every 6 (six) hours. 08/31/14   Raeford RazorStephen Kohut, MD   BP 180/105 mmHg  Pulse 140  Temp(Src) 97.6 F (36.4 C) (Oral)  Resp 20  Ht 6\' 1"  (1.854 m)  Wt 160 lb (72.576 kg)  BMI 21.11 kg/m2  SpO2 99%   Physical Exam  Constitutional: He is oriented to person, place, and time. He appears well-nourished. He appears distressed.  HENT:  Head: Normocephalic and atraumatic.  Mouth/Throat: Uvula is midline. Mucous membranes are dry.  Neck: Normal range of motion. Neck supple.  Cardiovascular: Normal rate, regular rhythm, normal heart sounds and intact distal pulses.   No murmur heard. Pulmonary/Chest: Effort normal and breath sounds normal. No respiratory distress. He exhibits no tenderness.  Abdominal: Soft. Normal appearance and bowel sounds are normal. He exhibits no distension and no mass. There is tenderness in the epigastric area. There is no rigidity, no rebound, no guarding, no CVA tenderness and no tenderness at  McBurney's point.  Musculoskeletal: Normal range of motion. He exhibits no edema.  Neurological: He is alert and oriented to person, place, and time. He exhibits normal muscle tone. Coordination normal.  Skin: Skin is warm and dry.  Nursing note and vitals reviewed.   ED Course  Procedures (including critical care time) Labs Review Labs Reviewed  CBC WITH  DIFFERENTIAL/PLATELET - Abnormal; Notable for the following:    WBC 22.4 (*)    Neutrophils Relative % 90 (*)    Lymphocytes Relative 5 (*)    Neutro Abs 20.2 (*)    Monocytes Absolute 1.1 (*)    All other components within normal limits  COMPREHENSIVE METABOLIC PANEL - Abnormal; Notable for the following:    Sodium 133 (*)    Potassium 5.8 (*)    Chloride 96 (*)    CO2 9 (*)    Glucose, Bld 585 (*)    BUN 25 (*)    Creatinine, Ser 1.47 (*)    Total Protein 8.5 (*)    Total Bilirubin 2.0 (*)    GFR calc non Af Amer 55 (*)    Anion gap 28 (*)    All other components within normal limits  URINALYSIS, ROUTINE W REFLEX MICROSCOPIC - Abnormal; Notable for the following:    Glucose, UA 500 (*)    Ketones, ur >80 (*)    All other components within normal limits   Chloride 112 (*)    CO2 12 (*)    BUN 21 (*)    Calcium 7.7 (*)                   CBG MONITORING, ED - Abnormal; Notable for the following:    Glucose-Capillary 498 (*)    All other components within normal limits  CBG MONITORING, ED - Abnormal; Notable for the following:    Glucose-Capillary 502 (*)    All other components within normal limits  CBG MONITORING, ED - Abnormal; Notable for the following:    Glucose-Capillary 395 (*)    All other components within normal limits    LIPASE, BLOOD  BASIC METABOLIC PANEL  BASIC METABOLIC PANEL    Imaging Review No results found.   EKG Interpretation None      MDM   Final diagnoses:  Epigastric pain  Diabetic ketoacidosis without coma associated with diabetes mellitus due to underlying condition    Patient is feeling better after IVF's, pain medication and insulin.  Blood sugar and heart rate remain elevated.  A. Gap is 28.  Epigastric tenderness without evidence of acute abdomen.  Will consult hospitalist for admission.   1139  Consulted Dr. David Stall will admit pt. To telemetry  [name] 10/24/14 2114  Mancel Bale, MD 10/27/14 [phone]

## 2014-10-23 NOTE — ED Notes (Signed)
585 blood sugar reported by lab to tammy, PA.

## 2014-10-23 NOTE — ED Notes (Addendum)
Patient brought in via EMS from home. Alert and oriented, Airway patent. Patient c/o generalized abd pain with tenderness noted in left upper quadrant to palpation per EMS personal. Patient reports nausea and vomiting. Denies any diarrhea. Last BM 2 days ago- normal. Patient diabetic, blood sugar 598 per EMS. Patient also tachycardic on monitor. Patient did receive a total of 8mg  zofran IV and 1000mL NS bolus in route to hospital.

## 2014-10-24 LAB — BASIC METABOLIC PANEL
ANION GAP: 7 (ref 5–15)
Anion gap: 9 (ref 5–15)
Anion gap: 15 (ref 5–15)
Anion gap: 14 (ref 5–15)
Anion gap: 9 (ref 5–15)
Anion gap: 8 (ref 5–15)
BUN: 12 mg/dL (ref 6–20)
BUN: 11 mg/dL (ref 6–20)
BUN: 11 mg/dL (ref 6–20)
BUN: 9 mg/dL (ref 6–20)
BUN: 7 mg/dL (ref 6–20)
BUN: 7 mg/dL (ref 6–20)
CALCIUM: 8 mg/dL — AB (ref 8.9–10.3)
CALCIUM: 8 mg/dL — AB (ref 8.9–10.3)
CHLORIDE: 104 mmol/L (ref 101–111)
CO2: 17 mmol/L — ABNORMAL LOW (ref 22–32)
CO2: 13 mmol/L — ABNORMAL LOW (ref 22–32)
CO2: 13 mmol/L — ABNORMAL LOW (ref 22–32)
CO2: 19 mmol/L — ABNORMAL LOW (ref 22–32)
CO2: 19 mmol/L — AB (ref 22–32)
CO2: 21 mmol/L — ABNORMAL LOW (ref 22–32)
CREATININE: 0.83 mg/dL (ref 0.61–1.24)
CREATININE: 0.81 mg/dL (ref 0.61–1.24)
Calcium: 7.8 mg/dL — ABNORMAL LOW (ref 8.9–10.3)
Calcium: 8.1 mg/dL — ABNORMAL LOW (ref 8.9–10.3)
Calcium: 7.7 mg/dL — ABNORMAL LOW (ref 8.9–10.3)
Calcium: 8 mg/dL — ABNORMAL LOW (ref 8.9–10.3)
Chloride: 106 mmol/L (ref 101–111)
Chloride: 103 mmol/L (ref 101–111)
Chloride: 105 mmol/L (ref 101–111)
Chloride: 108 mmol/L (ref 101–111)
Chloride: 105 mmol/L (ref 101–111)
Creatinine, Ser: 0.8 mg/dL (ref 0.61–1.24)
Creatinine, Ser: 0.66 mg/dL (ref 0.61–1.24)
Creatinine, Ser: 0.64 mg/dL (ref 0.61–1.24)
Creatinine, Ser: 0.56 mg/dL — ABNORMAL LOW (ref 0.61–1.24)
GFR calc Af Amer: >60 mL/min (ref >60)
GFR calc Af Amer: >60 mL/min (ref >60)
GFR calc Af Amer: >60 mL/min (ref >60)
GFR calc Af Amer: >60 mL/min (ref >60)
GFR calc Af Amer: >60 mL/min (ref >60)
GFR calc non Af Amer: >60 mL/min (ref >60)
GFR calc non Af Amer: >60 mL/min (ref >60)
GFR calc non Af Amer: >60 mL/min (ref >60)
GFR calc non Af Amer: >60 mL/min (ref >60)
GLUCOSE: 283 mg/dL — AB (ref 70–99)
GLUCOSE: 163 mg/dL — AB (ref 70–99)
Glucose, Bld: 190 mg/dL — ABNORMAL HIGH (ref 70–99)
Glucose, Bld: 298 mg/dL — ABNORMAL HIGH (ref 70–99)
Glucose, Bld: 128 mg/dL — ABNORMAL HIGH (ref 70–99)
Glucose, Bld: 147 mg/dL — ABNORMAL HIGH (ref 70–99)
POTASSIUM: 3.5 mmol/L (ref 3.5–5.1)
Potassium: 3.9 mmol/L (ref 3.5–5.1)
Potassium: 3.9 mmol/L (ref 3.5–5.1)
Potassium: 3.6 mmol/L (ref 3.5–5.1)
Potassium: 3.5 mmol/L (ref 3.5–5.1)
Potassium: 3.4 mmol/L — ABNORMAL LOW (ref 3.5–5.1)
SODIUM: 132 mmol/L — AB (ref 135–145)
SODIUM: 133 mmol/L — AB (ref 135–145)
SODIUM: 133 mmol/L — AB (ref 135–145)
Sodium: 132 mmol/L — ABNORMAL LOW (ref 135–145)
Sodium: 131 mmol/L — ABNORMAL LOW (ref 135–145)
Sodium: 134 mmol/L — ABNORMAL LOW (ref 135–145)

## 2014-10-24 LAB — GLUCOSE, CAPILLARY
GLUCOSE-CAPILLARY: 101 mg/dL — AB (ref 70–99)
GLUCOSE-CAPILLARY: 106 mg/dL — AB (ref 70–99)
GLUCOSE-CAPILLARY: 145 mg/dL — AB (ref 70–99)
GLUCOSE-CAPILLARY: 147 mg/dL — AB (ref 70–99)
GLUCOSE-CAPILLARY: 148 mg/dL — AB (ref 70–99)
GLUCOSE-CAPILLARY: 151 mg/dL — AB (ref 70–99)
GLUCOSE-CAPILLARY: 155 mg/dL — AB (ref 70–99)
GLUCOSE-CAPILLARY: 243 mg/dL — AB (ref 70–99)
Glucose-Capillary: 303 mg/dL — ABNORMAL HIGH (ref 70–99)
Glucose-Capillary: 213 mg/dL — ABNORMAL HIGH (ref 70–99)
Glucose-Capillary: 213 mg/dL — ABNORMAL HIGH (ref 70–99)
Glucose-Capillary: 183 mg/dL — ABNORMAL HIGH (ref 70–99)
Glucose-Capillary: 139 mg/dL — ABNORMAL HIGH (ref 70–99)
Glucose-Capillary: 107 mg/dL — ABNORMAL HIGH (ref 70–99)
Glucose-Capillary: 145 mg/dL — ABNORMAL HIGH (ref 70–99)
Glucose-Capillary: 154 mg/dL — ABNORMAL HIGH (ref 70–99)

## 2014-10-24 LAB — CBC
HEMATOCRIT: 35.3 % — AB (ref 39.0–52.0)
Hemoglobin: 11.9 g/dL — ABNORMAL LOW (ref 13.0–17.0)
MCH: 29.7 pg (ref 26.0–34.0)
MCHC: 33.7 g/dL (ref 30.0–36.0)
MCV: 88 fL (ref 78.0–100.0)
PLATELETS: 210 10*3/uL (ref 150–400)
RBC: 4.01 MIL/uL — AB (ref 4.22–5.81)
RDW: 13.3 % (ref 11.5–15.5)
WBC: 14.8 10*3/uL — AB (ref 4.0–10.5)

## 2014-10-24 LAB — HEMOGLOBIN A1C
HEMOGLOBIN A1C: 11.9 % — AB (ref 4.8–5.6)
Mean Plasma Glucose: 295 mg/dL

## 2014-10-24 MED ORDER — POTASSIUM CHLORIDE 10 MEQ/100ML IV SOLN
10.0000 meq | INTRAVENOUS | Status: AC
  Administered 2014-10-24 (×2): 10 meq via INTRAVENOUS
  Filled 2014-10-24 (×2): qty 100

## 2014-10-24 MED ORDER — ATORVASTATIN CALCIUM 40 MG PO TABS
40.0000 mg | ORAL_TABLET | Freq: Every day | ORAL | Status: DC
  Administered 2014-10-24 – 2014-10-25 (×2): 40 mg via ORAL
  Filled 2014-10-24 (×2): qty 1

## 2014-10-24 MED ORDER — ZOLPIDEM TARTRATE 5 MG PO TABS
10.0000 mg | ORAL_TABLET | Freq: Every evening | ORAL | Status: DC | PRN
  Administered 2014-10-24 – 2014-10-25 (×2): 10 mg via ORAL
  Filled 2014-10-24 (×2): qty 2

## 2014-10-24 MED ORDER — INSULIN GLARGINE 100 UNIT/ML ~~LOC~~ SOLN
5.0000 [IU] | Freq: Every day | SUBCUTANEOUS | Status: DC
  Administered 2014-10-24: 5 [IU] via SUBCUTANEOUS
  Filled 2014-10-24 (×2): qty 0.05

## 2014-10-24 MED ORDER — DEXTROSE-NACL 5-0.45 % IV SOLN
INTRAVENOUS | Status: DC
  Administered 2014-10-24 (×2): via INTRAVENOUS

## 2014-10-24 MED ORDER — ONDANSETRON HCL 4 MG/2ML IJ SOLN
4.0000 mg | Freq: Four times a day (QID) | INTRAMUSCULAR | Status: DC | PRN
  Administered 2014-10-24 – 2014-10-25 (×5): 4 mg via INTRAVENOUS
  Filled 2014-10-24 (×5): qty 2

## 2014-10-24 MED ORDER — PRAVASTATIN SODIUM 10 MG PO TABS: 20.0000 mg | ORAL_TABLET | Freq: Every day | ORAL | Status: DC

## 2014-10-24 MED ORDER — SODIUM CHLORIDE 0.9 % IV SOLN: INTRAVENOUS | Status: DC

## 2014-10-24 MED ORDER — ONDANSETRON HCL 4 MG PO TABS
4.0000 mg | ORAL_TABLET | ORAL | Status: DC | PRN
  Administered 2014-10-26: 4 mg via ORAL
  Filled 2014-10-24: qty 1

## 2014-10-24 MED ORDER — OXYCODONE-ACETAMINOPHEN 5-325 MG PO TABS
1.0000 | ORAL_TABLET | ORAL | Status: DC | PRN
  Administered 2014-10-24 – 2014-10-26 (×7): 2 via ORAL
  Filled 2014-10-24 (×7): qty 2

## 2014-10-24 MED ORDER — SODIUM CHLORIDE 0.9 % IV BOLUS (SEPSIS)
1000.0000 mL | Freq: Once | INTRAVENOUS | Status: AC
  Administered 2014-10-24: 1000 mL via INTRAVENOUS

## 2014-10-24 MED ORDER — SODIUM CHLORIDE 0.9 % IV SOLN
INTRAVENOUS | Status: DC
  Administered 2014-10-24: 2.4 [IU]/h via INTRAVENOUS
  Filled 2014-10-24: qty 2.5

## 2014-10-24 NOTE — Care Management Note (Signed)
Case Management Note  Patient Details  Name: Rickey Smith MRN: [id number] Date of Birth: 01/28/1966  Subjective/Objective:                    Action/Plan: Pt is from home, lives alone. Pt is unemployed, uninsured and [facility]. Pt plans to discharge home with self care. Referral made the financial counselor. Pt will need f/u appointment made at the health dept. Pt has had M[facility] voucher given in the past year, [facility]   Expected Discharge Date:  10/26/14               Expected Discharge Plan:  Home/Self Care  In-House Referral:  Financial Counselor  Discharge planning Services  CM Consult  Post Acute Care Choice:    Choice offered to:     DME Arranged:    DME Agency:     HH Arranged:    HH Agency:     Status of Service:  In process, will continue to follow  Medicare Important Message Given:    Date Medicare IM Given:    Medicare IM give by:    Date Additional Medicare IM Given:    Additional Medicare Important Message give by:     If discussed at Long Length of Stay Meetings, dates discussed:    Additional Comments:  Malcolm MetroChildress, Thayden Lemire Demske, RN 10/24/2014, 3:13 PM

## 2014-10-24 NOTE — Progress Notes (Addendum)
Inpatient Diabetes Program Recommendations  AACE/ADA: New Consensus Statement on Inpatient Glycemic Control (2013)  Target Ranges:  Prepandial:   less than 140 mg/dL      Peak postprandial:   less than 180 mg/dL (1-2 hours)      Critically ill patients:  140 - 180 mg/dL   Results for Dolores HooseLEMONS, Eirik S (MRN [id number]) as of 10/24/2014 15:12  Ref. Range 10/23/2014 08:24  Hemoglobin A1C Latest Ref Range: 4.8-5.6 % 11.9 (H)   Results for Dolores HooseLEMONS, Tarrance S (MRN [id number]) as of 10/24/2014 07:40  Ref. Range 10/24/2014 01:02 10/24/2014 05:09  Glucose Latest Ref Range: 70-99 mg/dL 782190 (H) 956298 (H)   Results for Dolores HooseLEMONS, Coyle S (MRN [id number]) as of 10/24/2014 07:40  Ref. Range 10/23/2014 20:01 10/23/2014 21:17 10/23/2014 22:02 10/23/2014 23:16 10/23/2014 23:58  Glucose-Capillary Latest Ref Range: 70-99 mg/dL 469165 (H) 629148 (H) 528188 (H) 111 (H) 101 (H)   Diabetes history: DM1 Outpatient Diabetes medications: 70/30 45 units QAM, 70/30 40 units QPM Current orders for Inpatient glycemic control: Novolin R insulin drip per DKA protocol  Inpatient Diabetes Program Recommendations Insulin - IV drip/GlucoStabilizer: In reviewing the chart, noted patient received Lantus 10 units at  22:10 and the IV insulin drip was stopped at 23:49. Glucose back up to 298 mg at 5:09 am on labs and IV insulin was restarted this morning at 7:09.  According to labs drawn 10/24/14 at 5:09 CO2 was 13 and AG 15.  Therefore, please continue IV insulin until acidosis is resolved (once CO2 >20, AG <10-12). Insulin - Basal: At time of transition off IV insulin drip, please consider ordering 70/30 20 units BID. Correction (SSI): At time of transition off IV insulin drip, please consider ordering Novolog sensitive correction scale ACHS.  10/24/14@3 :00 pm-Spoke with patient about diabetes and home regimen for diabetes control. Patient reports that he has Type 1 diabetes and [facility] and  medication assistance. Currently he takes 70/30 45 units with breakfast, 70/30 40 units at bedtime, and Novolin R BID (with lunch and supper) based on correction scale as an outpatient for diabetes control.  This diabetes coordinator talked with patient on 07/14/14 regarding follow up and compliance. Patient reports that he [facility] up [facility] after [facility] and he reports that he had an appointment [facility] scheduled for today for follow up. According to the patient he has already had his family contact [facility] and let them know that he needed to reschedule his appointment. Patient reports that he began feeling bad on Saturday and he went to this mother's house and forgot his glucometer and insulin at home. He continued to feel worse and his mother called 911. Patient reports that he checks his glucose 2-3 times per day and he states that his glucose runs in the 100's mg/dl for the most part. However, reported glucose trends as an outpatient does not correlate with current A1C of 11.9% Patient reports that he does not have any issues with hypoglycemia and does not recall when he last had a low blood glucose.  Discussed basic pathophysiology of DM Type 1 and absolute need for insulin even during sickness when he can not keep anything on his stomach. Discussed sick days and encouraged patient to have a discussion with his doctor for recommended insulin adjustments when he is unable to eat and to encouraged patient to check his glucose more frequently when  he is sick.  Stressed importance of glycemic control to prevent potential complications from uncontrolled diabetes. Patient reports that he is able to get everything he needs for diabetes management from the Health Department.  Patient verbalized understanding of information discussed and he states that he has no further questions at this time related to  diabetes.   Thanks, Orlando Penner, RN, MSN, CCRN, CDE Diabetes Coordinator Inpatient Diabetes Program [phone] (Team Pager from 8am to 5pm) [phone] ([facility] office) [phone] [facility] office)

## 2014-10-24 NOTE — Progress Notes (Signed)
[facility] PROGRESS NOTE Assessment/Plan: Diabetic ketoacidosis without coma associated with type 1 diabetes mellitus - Insulin drip was stopped overnight and his bicarbonate has now come down to 12 I will put him back on the insulin drip. - I will not start the insulin drip until the bicarbonate is greater than 20, I will read the blood glucose stabilizer.  - Start him on D5, given the diet.  - His abdominal pain was improving yesterday now he is back in DKA and his abdominal symptoms worsen.   Hyponatremia: - He is becoming mildly hyponatremic due to the final give a bolus of normal saline check a basic metabolic panel every 4 hours.   Metabolic acidosis: - Worsened due to DKA see above.   Leukocytosis: - Has remained afebrile leukocytosis continues to improve.   Normocytic anemia: - Will need further evaluation as an outpatient. At this point ferritin would not be reliable, as it will be reacting as an acute phase reactant.  Hyperkalemia - Continue to replete IV.   Code Status: full Family Communication: none  Disposition Plan: inpatient   Consultants:  none  Procedures:  none  Antibiotics:  None  HPI/Subjective: Patient continues to complain of abdominal pain, some nausea.   Objective: Filed Vitals:   10/24/14 0300 10/24/14 0400 10/24/14 0500 10/24/14 0600  BP: 128/77 130/79 138/70 145/92  Pulse: 108 102 102 111  Temp:  98.7 F (37.1 C)    TempSrc:  Oral    Resp: Height:      Weight:      SpO2: 96% 97% 97% 99%    Intake/Output Summary (Last 24 hours) at 10/24/14 0825 Last data filed at 10/24/14 0600  Gross per 24 hour  Intake 7579.75 ml  Output   3050 ml  Net 4529.75 ml   Filed Weights   10/23/14 0758 10/23/14 1315  Weight: 72.576 kg (160 lb) 70.4 kg (155 lb 3.3 oz)    Exam:  General: Alert, awake, oriented x3, in no acute distress.  HEENT: No bruits, no goiter.  Heart: Regular rate and rhythm. Lungs: Good air  movement, clear Abdomen: Soft, nontender, nondistended, positive bowel sounds.  Neuro: Grossly intact, nonfocal.   Data Reviewed: Basic Metabolic Panel:  Recent Labs Lab 10/23/14 0824 10/23/14 1501 10/23/14 1638 10/23/14 2106 10/24/14 0102 10/24/14 0509  NA 133* 134* 133* 132* 132* 131*  K 5.8* 4.9 4.6 3.9 3.9 3.9  CL 96* 112* 111 108 106 103  CO2 9* 12* 15* 19* 17* 13*  GLUCOSE 585* 258* 209* 160* 190* 298*  BUN 25* 21* CREATININE 1.47* 1.14 0.93 0.82 0.83 0.81  CALCIUM 9.2 7.7* 7.7* 8.0* 7.8* 8.1*  MG 2.5*  --   --   --   --   --    Liver Function Tests:  Recent Labs Lab 10/23/14 0824  AST 18  ALT 23  ALKPHOS 97  BILITOT 2.0*  PROT 8.5*  ALBUMIN 4.6    Recent Labs Lab 10/23/14 0824  LIPASE 26   No results for input(s): AMMONIA in the last 168 hours. CBC:  Recent Labs Lab 10/23/14 0824 10/24/14 0509  WBC 22.4* 14.8*  NEUTROABS 20.2*  --   HGB 15.1 11.9*  HCT 45.3 35.3*  MCV 90.2 88.0  PLT 353 210   Cardiac Enzymes: No results for input(s): CKTOTAL, CKMB, CKMBINDEX, TROPONINI in the last 168 hours. BNP (last 3 results) No results for input(s): BNP in the  last 8760 hours.  ProBNP (last 3 results) No results for input(s): PROBNP in the last 8760 hours.  CBG:  Recent Labs Lab 10/23/14 2117 10/23/14 2202 10/23/14 2316 10/23/14 2358 10/24/14 0729  GLUCAP 148* 188* 111* 101* 303*    Recent Results (from the past 240 hour(s))  MRSA PCR Screening     Status: None   Collection Time: 10/23/14 12:35 PM  Result Value Ref Range Status   MRSA by PCR NEGATIVE NEGATIVE Final    Comment:        The GeneXpert MRSA Assay (FDA approved for NASAL specimens only), is one component of a comprehensive MRSA colonization surveillance program. It is not intended to diagnose MRSA infection nor to guide or monitor treatment for MRSA infections.      Studies: No results found.  Scheduled Meds: . amitriptyline  50 mg Oral QHS  .  antiseptic oral rinse  7 mL Mouth Rinse BID  . heparin  5,000 Units Subcutaneous 3 times per day  . potassium chloride  10 mEq Intravenous Q1H  . sodium chloride  1,000 mL Intravenous Once  . sodium chloride  3 mL Intravenous Q12H   Continuous Infusions: . dextrose 5 % and 0.45% NaCl    . insulin (NOVOLIN-R) infusion 2.4 Units/hr (10/24/14 0732)    Time Spent: 35 min.   Marinda ElkFELIZ ORTIZ, Rickey Smith  [facility] Pager [phone]. If 7PM-7AM, please contact night-coverage at [email or web address], password [email or web address] 10/24/2014, 8:25 AM  LOS: 1 day

## 2014-10-25 DIAGNOSIS — I10 Essential (primary) hypertension: Secondary | ICD-10-CM

## 2014-10-25 LAB — GLUCOSE, CAPILLARY
GLUCOSE-CAPILLARY: 136 mg/dL — AB (ref 70–99)
GLUCOSE-CAPILLARY: 167 mg/dL — AB (ref 70–99)
Glucose-Capillary: 161 mg/dL — ABNORMAL HIGH (ref 70–99)
Glucose-Capillary: 159 mg/dL — ABNORMAL HIGH (ref 70–99)
Glucose-Capillary: 184 mg/dL — ABNORMAL HIGH (ref 70–99)
Glucose-Capillary: 216 mg/dL — ABNORMAL HIGH (ref 70–99)
Glucose-Capillary: 143 mg/dL — ABNORMAL HIGH (ref 70–99)
Glucose-Capillary: 169 mg/dL — ABNORMAL HIGH (ref 70–99)
Glucose-Capillary: 103 mg/dL — ABNORMAL HIGH (ref 70–99)

## 2014-10-25 LAB — BASIC METABOLIC PANEL
ANION GAP: 8 (ref 5–15)
Anion gap: 7 (ref 5–15)
BUN: 6 mg/dL (ref 6–20)
BUN: 6 mg/dL (ref 6–20)
CALCIUM: 8.1 mg/dL — AB (ref 8.9–10.3)
CHLORIDE: 102 mmol/L (ref 101–111)
CO2: 24 mmol/L (ref 22–32)
CO2: 24 mmol/L (ref 22–32)
Calcium: 7.9 mg/dL — ABNORMAL LOW (ref 8.9–10.3)
Chloride: 102 mmol/L (ref 101–111)
Creatinine, Ser: 0.59 mg/dL — ABNORMAL LOW (ref 0.61–1.24)
Creatinine, Ser: 0.55 mg/dL — ABNORMAL LOW (ref 0.61–1.24)
GFR calc Af Amer: >60 mL/min (ref >60)
GFR calc non Af Amer: >60 mL/min (ref >60)
Glucose, Bld: 164 mg/dL — ABNORMAL HIGH (ref 70–99)
Glucose, Bld: 176 mg/dL — ABNORMAL HIGH (ref 70–99)
POTASSIUM: 2.8 mmol/L — AB (ref 3.5–5.1)
Potassium: 2.8 mmol/L — ABNORMAL LOW (ref 3.5–5.1)
SODIUM: 134 mmol/L — AB (ref 135–145)
Sodium: 133 mmol/L — ABNORMAL LOW (ref 135–145)

## 2014-10-25 LAB — LIPASE, BLOOD: Lipase: 18 U/L — ABNORMAL LOW (ref 22–51)

## 2014-10-25 LAB — POTASSIUM: Potassium: 3.2 mmol/L — ABNORMAL LOW (ref 3.5–5.1)

## 2014-10-25 MED ORDER — INSULIN ASPART 100 UNIT/ML ~~LOC~~ SOLN: 0.0000 [IU] | Freq: Every day | SUBCUTANEOUS | Status: DC

## 2014-10-25 MED ORDER — INSULIN ASPART 100 UNIT/ML ~~LOC~~ SOLN
0.0000 [IU] | Freq: Three times a day (TID) | SUBCUTANEOUS | Status: DC
  Administered 2014-10-25: 5 [IU] via SUBCUTANEOUS
  Administered 2014-10-25: 2 [IU] via SUBCUTANEOUS
  Administered 2014-10-25: 3 [IU] via SUBCUTANEOUS
  Administered 2014-10-26: 5 [IU] via SUBCUTANEOUS

## 2014-10-25 MED ORDER — POTASSIUM CHLORIDE CRYS ER 20 MEQ PO TBCR
40.0000 meq | EXTENDED_RELEASE_TABLET | Freq: Once | ORAL | Status: AC
  Administered 2014-10-25: 40 meq via ORAL
  Filled 2014-10-25: qty 2

## 2014-10-25 MED ORDER — SODIUM CHLORIDE 0.9 % IV SOLN
INTRAVENOUS | Status: DC
  Filled 2014-10-25: qty 2.5

## 2014-10-25 MED ORDER — SODIUM CHLORIDE 0.9 % IV SOLN
INTRAVENOUS | Status: DC
  Administered 2014-10-25: 04:00:00 via INTRAVENOUS

## 2014-10-25 MED ORDER — METOPROLOL TARTRATE 25 MG PO TABS
12.5000 mg | ORAL_TABLET | Freq: Two times a day (BID) | ORAL | Status: DC
  Administered 2014-10-25 – 2014-10-26 (×3): 12.5 mg via ORAL
  Filled 2014-10-25 (×3): qty 1

## 2014-10-25 MED ORDER — POTASSIUM CHLORIDE IN NACL 20-0.9 MEQ/L-% IV SOLN
INTRAVENOUS | Status: DC
  Administered 2014-10-25: 09:00:00 via INTRAVENOUS

## 2014-10-25 MED ORDER — INSULIN ASPART 100 UNIT/ML ~~LOC~~ SOLN
5.0000 [IU] | Freq: Three times a day (TID) | SUBCUTANEOUS | Status: DC
  Administered 2014-10-25 – 2014-10-26 (×5): 5 [IU] via SUBCUTANEOUS

## 2014-10-25 MED ORDER — INSULIN ASPART PROT & ASPART (70-30 MIX) 100 UNIT/ML ~~LOC~~ SUSP
20.0000 [IU] | Freq: Two times a day (BID) | SUBCUTANEOUS | Status: DC
  Administered 2014-10-25 – 2014-10-26 (×3): 20 [IU] via SUBCUTANEOUS
  Filled 2014-10-25: qty 10

## 2014-10-25 MED ORDER — INSULIN ASPART 100 UNIT/ML ~~LOC~~ SOLN: 10.0000 [IU] | Freq: Three times a day (TID) | SUBCUTANEOUS | Status: DC

## 2014-10-25 MED ORDER — MAGNESIUM SULFATE 2 GM/50ML IV SOLN
2.0000 g | Freq: Once | INTRAVENOUS | Status: AC
  Administered 2014-10-25: 2 g via INTRAVENOUS
  Filled 2014-10-25: qty 50

## 2014-10-25 MED ORDER — INSULIN GLARGINE 100 UNIT/ML ~~LOC~~ SOLN
20.0000 [IU] | Freq: Two times a day (BID) | SUBCUTANEOUS | Status: DC
  Administered 2014-10-25: 20 [IU] via SUBCUTANEOUS
  Filled 2014-10-25 (×2): qty 0.2

## 2014-10-25 NOTE — Progress Notes (Signed)
[facility] PROGRESS NOTE  Rickey Smith:[id number] DOB: 02/17/1966 DOA: 10/23/2014 PCP: No PCP Per Patient  Assessment/Plan: 49 y/o male with PMH of HTN, IDDM (non adherent) presented with nausea, vomiting  -admitted with DKA   1. DKA. AG acidosis on admission.DKA resolved on IV insulin drip. Transitioned to SQ insulin. Cont to replace lytes. Recheck BMP in AM. Start diet   2. IDDM. Uncontrolled-non adherance. [id number]-11.9. Patient reports home insulin regimen (45 units AM, 40 unit PM of 70/30+mealtime insulin around 10 units) -restart insulin 70/30 at 20 BID, cont mealtime at 5+ISS. Avoid hypoglycemia due to non adherence. Close monitor, adjust as needed  3. Leukocytosis likely due to stress. Afebrile. No s/s of infection. Wbc -trending down  4. Hypokalemia in the setting of DKA. Replace lytes. Recheck.   5. Nausea, vomiting with DKA. GI symptoms-resolved. Cont gentle IVF.  6. HTN. Stable metoprolol. Titrate as needed   TF: ICU->RNF on 5/3  Code Status: full Family Communication: d/w patient (indicate person spoken with, relationship, and if by phone, the number) Disposition Plan: home 24-48 hrs if stable    Consultants:  none  Procedures:  none  Antibiotics:  none (indicate start date, and stop date if known)  HPI/Subjective: Alert, oriented   Objective: Filed Vitals:   10/25/14 0600  BP: 143/85  Pulse: 98  Temp:   Resp: 18    Intake/Output Summary (Last 24 hours) at 10/25/14 0744 Last data filed at 10/25/14 0600  Gross per 24 hour  Intake 2223.39 ml  Output   3875 ml  Net -1651.61 ml   Filed Weights   10/23/14 0758 10/23/14 1315 10/25/14 0500  Weight: 72.576 kg (160 lb) 70.4 kg (155 lb 3.3 oz) 71.1 kg (156 lb 12 oz)    Exam:   General:  Alert, no distress   Cardiovascular: s1,s2 rrr  Respiratory: CTA BL  Abdomen: soft, nt,nd   Musculoskeletal: no leg edema   Data Reviewed: Basic Metabolic Panel:  Recent Labs Lab 10/23/14 0824   10/24/14 1225 10/24/14 1555 10/24/14 2012 10/25/14 0100 10/25/14 0323  NA 133*  < > 134* 133* 133* 133* 134*  K 5.8*  < > 3.5 3.5 3.4* 2.8* 2.8*  CL 96*  < > 108 105 104 102 102  CO2 9*  < > 19* 19* 21* 24 24  GLUCOSE 585*  < > 163* 128* 147* 164* 176*  BUN 25*  < > CREATININE 1.47*  < > 0.66 0.64 0.56* 0.59* 0.55*  CALCIUM 9.2  < > 8.0* 8.0* 8.0* 7.9* 8.1*  MG 2.5*  --   --   --   --   --   --   < > = values in this interval not displayed. Liver Function Tests:  Recent Labs Lab 10/23/14 0824  AST 18  ALT 23  ALKPHOS 97  BILITOT 2.0*  PROT 8.5*  ALBUMIN 4.6    Recent Labs Lab 10/23/14 0824  LIPASE 26   No results for input(s): AMMONIA in the last 168 hours. CBC:  Recent Labs Lab 10/23/14 0824 10/24/14 0509  WBC 22.4* 14.8*  NEUTROABS 20.2*  --   HGB 15.1 11.9*  HCT 45.3 35.3*  MCV 90.2 88.0  PLT 353 210   Cardiac Enzymes: No results for input(s): CKTOTAL, CKMB, CKMBINDEX, TROPONINI in the last 168 hours. BNP (last 3 results) No results for input(s): BNP in the last 8760 hours.  ProBNP (last 3 results) No results for input(s):  PROBNP in the last 8760 hours.  CBG:  Recent Labs Lab 10/24/14 2345 10/25/14 0103 10/25/14 0159 10/25/14 0305 10/25/14 0403  GLUCAP 136* 161* 159* 167* 184*    Recent Results (from the past 240 hour(s))  MRSA PCR Screening     Status: None   Collection Time: 10/23/14 12:35 PM  Result Value Ref Range Status   MRSA by PCR NEGATIVE NEGATIVE Final    Comment:        The GeneXpert MRSA Assay (FDA approved for NASAL specimens only), is one component of a comprehensive MRSA colonization surveillance program. It is not intended to diagnose MRSA infection nor to guide or monitor treatment for MRSA infections.      Studies: No results found.  Scheduled Meds: . amitriptyline  50 mg Oral QHS  . antiseptic oral rinse  7 mL Mouth Rinse BID  . atorvastatin  40 mg Oral q1800  . heparin  5,000 Units  Subcutaneous 3 times per day  . insulin aspart  0-15 Units Subcutaneous TID WC  . insulin aspart  0-5 Units Subcutaneous QHS  . insulin aspart  10 Units Subcutaneous TID WC  . insulin glargine  20 Units Subcutaneous BID  . sodium chloride  3 mL Intravenous Q12H   Continuous Infusions: . sodium chloride 100 mL/hr at 10/25/14 0355    Active Problems:   Hyponatremia   Metabolic acidosis   Diabetic ketoacidosis without coma associated with type 1 diabetes mellitus   Leukocytosis   Hyperkalemia   DKA, type 1    Time spent: >35 minutes     Esperanza SheetsBURIEV, Stewart Pimenta N  [facility] Pager [phone]. If 7PM-7AM, please contact night-coverage at [email or web address], password [email or web address] 10/25/2014, 7:44 AM  LOS: 2 days

## 2014-10-25 NOTE — Progress Notes (Signed)
Paged MD about patient's potassium level of 2.8.

## 2014-10-26 DIAGNOSIS — K219 Gastro-esophageal reflux disease without esophagitis: Secondary | ICD-10-CM

## 2014-10-26 LAB — CBC
HCT: 38.7 % — ABNORMAL LOW (ref 39.0–52.0)
Hemoglobin: 13.1 g/dL (ref 13.0–17.0)
MCH: 29 pg (ref 26.0–34.0)
MCHC: 33.9 g/dL (ref 30.0–36.0)
MCV: 85.8 fL (ref 78.0–100.0)
PLATELETS: 171 10*3/uL (ref 150–400)
RBC: 4.51 MIL/uL (ref 4.22–5.81)
RDW: 13.1 % (ref 11.5–15.5)
WBC: 5.4 10*3/uL (ref 4.0–10.5)

## 2014-10-26 LAB — GLUCOSE, CAPILLARY
GLUCOSE-CAPILLARY: 250 mg/dL — AB (ref 70–99)
Glucose-Capillary: 123 mg/dL — ABNORMAL HIGH (ref 70–99)

## 2014-10-26 LAB — BASIC METABOLIC PANEL
ANION GAP: 6 (ref 5–15)
BUN: 7 mg/dL (ref 6–20)
CALCIUM: 8.2 mg/dL — AB (ref 8.9–10.3)
CO2: 27 mmol/L (ref 22–32)
Chloride: 101 mmol/L (ref 101–111)
Creatinine, Ser: 0.61 mg/dL (ref 0.61–1.24)
GFR calc non Af Amer: >60 mL/min (ref >60)
GLUCOSE: 246 mg/dL — AB (ref 70–99)
POTASSIUM: 3.6 mmol/L (ref 3.5–5.1)
Sodium: 134 mmol/L — ABNORMAL LOW (ref 135–145)

## 2014-10-26 MED ORDER — METOPROLOL TARTRATE 25 MG PO TABS: 12.5000 mg | ORAL_TABLET | Freq: Two times a day (BID) | ORAL | Status: DC

## 2014-10-26 MED ORDER — PANTOPRAZOLE SODIUM 40 MG PO TBEC: 40.0000 mg | DELAYED_RELEASE_TABLET | Freq: Every day | ORAL | Status: DC

## 2014-10-26 MED ORDER — ALUM & MAG HYDROXIDE-SIMETH 200-200-20 MG/5ML PO SUSP
30.0000 mL | Freq: Four times a day (QID) | ORAL | Status: DC | PRN
  Administered 2014-10-26: 30 mL via ORAL
  Filled 2014-10-26: qty 30

## 2014-10-26 MED ORDER — ALUM & MAG HYDROXIDE-SIMETH 200-200-20 MG/5ML PO SUSP: 30.0000 mL | Freq: Four times a day (QID) | ORAL | Status: DC | PRN

## 2014-10-26 MED ORDER — INSULIN ASPART PROT & ASPART (70-30 MIX) 100 UNIT/ML ~~LOC~~ SUSP
22.0000 [IU] | Freq: Two times a day (BID) | SUBCUTANEOUS | Status: DC
  Filled 2014-10-26: qty 10

## 2014-10-26 MED ORDER — ATORVASTATIN CALCIUM 40 MG PO TABS: 40.0000 mg | ORAL_TABLET | Freq: Every day | ORAL | Status: DC

## 2014-10-26 MED ORDER — ONDANSETRON HCL 4 MG PO TABS: 4.0000 mg | ORAL_TABLET | Freq: Four times a day (QID) | ORAL | Status: DC

## 2014-10-26 MED ORDER — INSULIN ASPART PROT & ASPART (70-30 MIX) 100 UNIT/ML ~~LOC~~ SUSP: 22.0000 [IU] | Freq: Two times a day (BID) | SUBCUTANEOUS | Status: DC

## 2014-10-26 MED ORDER — PANTOPRAZOLE SODIUM 40 MG PO TBEC
40.0000 mg | DELAYED_RELEASE_TABLET | Freq: Every day | ORAL | Status: DC
  Administered 2014-10-26: 40 mg via ORAL
  Filled 2014-10-26: qty 1

## 2014-10-26 NOTE — Progress Notes (Signed)
Dc instructions reviewed with patient. Verbalized understanding. Pt dc to home with family.  Schonewitz, Candelaria StagersLeigh Anne 10/26/2014

## 2014-10-26 NOTE — Care Management Note (Signed)
Case Management Note  Patient Details  Name: Rickey HooseWilliam S Borgeson MRN: [id number] Date of Birth: 05/16/1966   Expected Discharge Date:  10/26/14               Expected Discharge Plan:  Home/Self Care  In-House Referral:  Financial Counselor  Discharge planning Services  [facility] Consult, [facility]  Post Acute Care Choice:    Choice offered to:     DME Arranged:    DME Agency:     HH Arranged:    HH Agency:     Status of Service:  Completed, signed off  Medicare Important Message Given:    Date Medicare IM Given:    Medicare IM give by:    Date Additional Medicare IM Given:    Additional Medicare Important Message give by:     If discussed at Long Length of Stay Meetings, dates discussed:    Additional Comments:  Pt discharging home with self care. Pt given MATCH voucher for prescribed meds, pt has not received MATCH voucher in the past year (clarification). [facility] No further CM needs.   Malcolm Metrohildress, Karmella Bouvier Demske, RN 10/26/2014, 12:42 PM

## 2014-10-26 NOTE — Progress Notes (Signed)
Inpatient Diabetes Program Recommendations  AACE/ADA: New Consensus Statement on Inpatient Glycemic Control (2013)  Target Ranges:  Prepandial:   less than 140 mg/dL      Peak postprandial:   less than 180 mg/dL (1-2 hours)      Critically ill patients:  140 - 180 mg/dL   Results for Dolores HooseLEMONS, Early S (MRN [id number]) as of 10/26/2014 11:36  Ref. Range 10/25/2014 07:43 10/25/2014 11:42 10/25/2014 16:29 10/25/2014 21:02 10/26/2014 07:53  Glucose-Capillary Latest Ref Range: 70-99 mg/dL 409216 (H) 811143 (H) 914169 (H) 103 (H) 250 (H)   Diabetes history: DM1 Outpatient Diabetes medications: 70/30 45 units QAM, 70/30 40 units QHS, Novolin R BID (with lunch and supper) Current orders for Inpatient glycemic control:70/30 20 units BID, Novolog 5 units TID with meals, Novolog 0-15 units TID with meals, Novolog 0-5 units HS  Inpatient Diabetes Program Recommendations Insulin - Basal: Please consider increasing 70/30 to 22 units BID.  Thanks, Orlando PennerMarie Joley Utecht, RN, MSN, CCRN, CDE Diabetes Coordinator Inpatient Diabetes Program [phone] (Team Pager from 8am to 5pm) [phone] ([facility] office) [phone] [address] Norman([facility] office)

## 2014-10-26 NOTE — Discharge Summary (Signed)
Physician Discharge Summary  Rickey HooseWilliam S Sharpless IEP:[id number] DOB: 06/18/1966 DOA: 10/23/2014  PCP: No PCP Per Patient  Admit date: 10/23/2014 Discharge date: 10/26/2014  Time spent: 30 minutes  Recommendations for Outpatient Follow-up:  -Will be discharged home today. -Advised to follow-up with health department for further treatment of diabetes and GERD.  Discharge Diagnoses:  Active Problems:   Hyponatremia   Metabolic acidosis   Diabetic ketoacidosis without coma associated with type 1 diabetes mellitus   Leukocytosis   Hyperkalemia   DKA, type 1   Discharge Condition: stable and improved  Filed Weights   10/23/14 1315 10/25/14 0500 10/26/14 0500  Weight: 70.4 kg (155 lb 3.3 oz) 71.1 kg (156 lb 12 oz) 70.9 kg (156 lb 4.9 oz)    History of present illness:  Rickey Smith is a 49 y.o. male with past history of type 1 diabetes mellitus discharged from the hospital on January for DKA comes in for nausea and vomiting and abdominal pain that started 3 days prior to admission. He relates he is not taking his insulin regularly. He denies any cuts or sores, any fever, chills, diarrhea or sick contacts. He thought he could take his insulin at home and be better when he started to have a concurrent keep anything down he decided to stop his insulin. And was brought to the emergency room by EMS.  Hospital Course:   DKA -Resolved with fluids and IV insulin drip. -Transitioned to 7030 due to financial constraints. -We discharged on 22 units twice a day of 7030, advised to follow-up with health department for further diabetic dosing adjustments.  Hyponatremia -Falsely low due to DKA, resolved.  GERD -Start daily Protonix and Maalox as needed.  Hypertension -Continue metoprolol, currently well controlled.  Procedures:  none   Consultations:  none  Discharge Instructions  Discharge Instructions    Diet Carb Modified    Complete by:  As directed      Increase activity  slowly    Complete by:  As directed             Medication List    STOP taking these medications        aspirin 81 MG EC tablet      TAKE these medications        alum & mag hydroxide-simeth 200-200-20 MG/5ML suspension  Commonly known as:  MAALOX/MYLANTA  Take 30 mLs by mouth every 6 (six) hours as needed for indigestion or heartburn.     amitriptyline 50 MG tablet  Commonly known as:  ELAVIL  Take 50 mg by mouth at bedtime.     atorvastatin 40 MG tablet  Commonly known as:  LIPITOR  Take 1 tablet (40 mg total) by mouth daily at 6 PM.     insulin aspart protamine- aspart (70-30) 100 UNIT/ML injection  Commonly known as:  NOVOLOG MIX 70/30  Inject 0.22 mLs (22 Units total) into the skin 2 (two) times daily with a meal.     metoprolol tartrate 25 MG tablet  Commonly known as:  LOPRESSOR  Take 0.5 tablets (12.5 mg total) by mouth 2 (two) times daily.     ondansetron 4 MG tablet  Commonly known as:  ZOFRAN  Take 1 tablet (4 mg total) by mouth every 6 (six) hours.     pantoprazole 40 MG tablet  Commonly known as:  PROTONIX  Take 1 tablet (40 mg total) by mouth daily.       Allergies  Allergen Reactions  .  Codeine Itching  . Gabapentin Diarrhea and Nausea And Vomiting       Follow-up Information    [facility]   Specialty:  Occupational Therapy   Contact information:   [address] [place] [id number] [phone]        The results of significant diagnostics from this hospitalization (including imaging, microbiology, ancillary and laboratory) are listed below for reference.    Significant Diagnostic Studies: No results found.  Microbiology: Recent Results (from the past 240 hour(s))  MRSA PCR Screening     Status: None   Collection Time: 10/23/14 12:35 PM  Result Value Ref Range Status   MRSA by PCR NEGATIVE NEGATIVE Final    Comment:        The GeneXpert MRSA Assay (FDA approved for NASAL  specimens only), is one component of a comprehensive MRSA colonization surveillance program. It is not intended to diagnose MRSA infection nor to guide or monitor treatment for MRSA infections.      Labs: Basic Metabolic Panel:  Recent Labs Lab 10/23/14 0824  10/24/14 1555 10/24/14 2012 10/25/14 0100 10/25/14 0323 10/25/14 1535 10/26/14 0545  NA 133*  < > 133* 133* 133* 134*  --  134*  K 5.8*  < > 3.5 3.4* 2.8* 2.8* 3.2* 3.6  CL 96*  < > 105 104 102 102  --  101  CO2 9*  < > 19* 21* 24 24  --  27  GLUCOSE 585*  < > 128* 147* 164* 176*  --  246*  BUN 25*  < > 7 7 6 6   --  7  CREATININE 1.47*  < > 0.64 0.56* 0.59* 0.55*  --  0.61  CALCIUM 9.2  < > 8.0* 8.0* 7.9* 8.1*  --  8.2*  MG 2.5*  --   --   --   --   --   --   --   < > = values in this interval not displayed. Liver Function Tests:  Recent Labs Lab 10/23/14 0824  AST 18  ALT 23  ALKPHOS 97  BILITOT 2.0*  PROT 8.5*  ALBUMIN 4.6    Recent Labs Lab 10/23/14 0824 10/25/14 0323  LIPASE 26 18*   No results for input(s): AMMONIA in the last 168 hours. CBC:  Recent Labs Lab 10/23/14 0824 10/24/14 0509 10/26/14 0545  WBC 22.4* 14.8* 5.4  NEUTROABS 20.2*  --   --   HGB 15.1 11.9* 13.1  HCT 45.3 35.3* 38.7*  MCV 90.2 88.0 85.8  PLT 353 210 171   Cardiac Enzymes: No results for input(s): CKTOTAL, CKMB, CKMBINDEX, TROPONINI in the last 168 hours. BNP: BNP (last 3 results) No results for input(s): BNP in the last 8760 hours.  ProBNP (last 3 results) No results for input(s): PROBNP in the last 8760 hours.  CBG:  Recent Labs Lab 10/25/14 0743 10/25/14 1142 10/25/14 1629 10/25/14 2102 10/26/14 0753  GLUCAP 216* 143* 169* 103* 250*       Signed:  Chaya JanHERNANDEZ ACOSTA,ESTELA  [facility] Pager: [phone] 10/26/2014, 12:22 PM

## 2015-04-16 ENCOUNTER — Encounter (HOSPITAL_COMMUNITY): Payer: Self-pay | Admitting: Emergency Medicine

## 2015-04-16 ENCOUNTER — Emergency Department (HOSPITAL_COMMUNITY): Payer: Self-pay

## 2015-04-16 ENCOUNTER — Emergency Department (HOSPITAL_COMMUNITY)
Admission: EM | Admit: 2015-04-16 | Discharge: 2015-04-16 | Disposition: A | Discharge status: Home / Self Care | Payer: Self-pay | Attending: Emergency Medicine | Admitting: Emergency Medicine

## 2015-04-16 DIAGNOSIS — R000 Tachycardia, unspecified: Secondary | ICD-10-CM | POA: Insufficient documentation

## 2015-04-16 DIAGNOSIS — E119 Type 2 diabetes mellitus without complications: Secondary | ICD-10-CM | POA: Insufficient documentation

## 2015-04-16 DIAGNOSIS — G43009 Migraine without aura, not intractable, without status migrainosus: Secondary | ICD-10-CM

## 2015-04-16 DIAGNOSIS — G43909 Migraine, unspecified, not intractable, without status migrainosus: Secondary | ICD-10-CM | POA: Insufficient documentation

## 2015-04-16 DIAGNOSIS — Z794 Long term (current) use of insulin: Secondary | ICD-10-CM | POA: Insufficient documentation

## 2015-04-16 DIAGNOSIS — I509 Heart failure, unspecified: Secondary | ICD-10-CM | POA: Insufficient documentation

## 2015-04-16 DIAGNOSIS — Z720 Tobacco use: Secondary | ICD-10-CM | POA: Insufficient documentation

## 2015-04-16 DIAGNOSIS — Z79899 Other long term (current) drug therapy: Secondary | ICD-10-CM | POA: Insufficient documentation

## 2015-04-16 LAB — I-STAT CHEM 8, ED
BUN: 11 mg/dL (ref 6–20)
CHLORIDE: 100 mmol/L — AB (ref 101–111)
Calcium, Ion: 1.17 mmol/L (ref 1.12–1.23)
Creatinine, Ser: 0.8 mg/dL (ref 0.61–1.24)
GLUCOSE: 194 mg/dL — AB (ref 65–99)
HCT: 46 % (ref 39.0–52.0)
Hemoglobin: 15.6 g/dL (ref 13.0–17.0)
Potassium: 4 mmol/L (ref 3.5–5.1)
Sodium: 139 mmol/L (ref 135–145)
TCO2: 24 mmol/L (ref 0–100)

## 2015-04-16 MED ORDER — MORPHINE SULFATE (PF) 4 MG/ML IV SOLN
4.0000 mg | Freq: Once | INTRAVENOUS | Status: AC
  Administered 2015-04-16: 4 mg via INTRAVENOUS
  Filled 2015-04-16: qty 1

## 2015-04-16 MED ORDER — IBUPROFEN 800 MG PO TABS: 800.0000 mg | ORAL_TABLET | Freq: Three times a day (TID) | ORAL | Status: DC | PRN

## 2015-04-16 MED ORDER — SODIUM CHLORIDE 0.9 % IV BOLUS (SEPSIS)
1000.0000 mL | Freq: Once | INTRAVENOUS | Status: AC
  Administered 2015-04-16: 1000 mL via INTRAVENOUS

## 2015-04-16 MED ORDER — DIPHENHYDRAMINE HCL 50 MG/ML IJ SOLN
50.0000 mg | Freq: Once | INTRAMUSCULAR | Status: AC
  Administered 2015-04-16: 50 mg via INTRAVENOUS
  Filled 2015-04-16: qty 1

## 2015-04-16 MED ORDER — KETOROLAC TROMETHAMINE 30 MG/ML IJ SOLN
30.0000 mg | Freq: Once | INTRAMUSCULAR | Status: AC
  Administered 2015-04-16: 30 mg via INTRAVENOUS
  Filled 2015-04-16: qty 1

## 2015-04-16 MED ORDER — IOHEXOL 350 MG/ML SOLN
100.0000 mL | Freq: Once | INTRAVENOUS | Status: AC | PRN
  Administered 2015-04-16: 100 mL via INTRAVENOUS

## 2015-04-16 MED ORDER — PROMETHAZINE HCL 25 MG/ML IJ SOLN
25.0000 mg | Freq: Once | INTRAMUSCULAR | Status: AC
  Administered 2015-04-16: 25 mg via INTRAVENOUS
  Filled 2015-04-16: qty 1

## 2015-04-16 MED ORDER — PROMETHAZINE HCL 25 MG PO TABS: 25.0000 mg | ORAL_TABLET | Freq: Four times a day (QID) | ORAL | Status: DC | PRN

## 2015-04-16 MED ORDER — METOCLOPRAMIDE HCL 5 MG/ML IJ SOLN
10.0000 mg | Freq: Once | INTRAMUSCULAR | Status: AC
  Administered 2015-04-16: 10 mg via INTRAVENOUS
  Filled 2015-04-16: qty 2

## 2015-04-16 NOTE — ED Notes (Signed)
Pt states that he started having a headache about 2 hours ago with nausea

## 2015-04-16 NOTE — Discharge Instructions (Signed)

## 2015-04-16 NOTE — ED Provider Notes (Signed)
TIME SEEN: 4:45 AM  CHIEF COMPLAINT: Headache, nausea  HPI: Pt is a 49 y.o. male with history of diabetes, CHF, migraine headaches who presents emergency department with a diffuse, sharp, stabbing headache that started at midnight. States he was laying down to go to sleep and headache started. States it started off as a 4 and has progressively worsened to a 10. States it feels similar to his prior migraines but is "much worse". Does describe this as the worst headache of his life. Denies numbness, tingling or focal weakness. No head injury. Not on anticoagulation. No fever, neck pain or neck stiffness. States normally when he has a migraine he is able to go to sleep and his headache improves. States pain is worse with lights, sounds. No alleviating factors. Has had nausea but no vomiting or diarrhea.  ROS: See HPI Constitutional: no fever  Eyes: no drainage  ENT: no runny nose   Cardiovascular:  no chest pain  Resp: no SOB  GI: no vomiting GU: no dysuria Integumentary: no rash  Allergy: no hives  Musculoskeletal: no leg swelling  Neurological: no slurred speech ROS otherwise negative  PAST MEDICAL HISTORY/PAST SURGICAL HISTORY:  Past Medical History  Diagnosis Date  . Diabetes mellitus without complication (HCC)   . CHF (congestive heart failure) (HCC)     MEDICATIONS:  Prior to Admission medications   Medication Sig Start Date End Date Taking? Authorizing Provider  alum & mag hydroxide-simeth (MAALOX/MYLANTA) 200-200-20 MG/5ML suspension Take 30 mLs by mouth every 6 (six) hours as needed for indigestion or heartburn. 10/26/14  Yes Henderson CloudEstela Y Hernandez Acosta, MD  amitriptyline (ELAVIL) 50 MG tablet Take 50 mg by mouth at bedtime.   Yes Historical Provider, MD  atorvastatin (LIPITOR) 40 MG tablet Take 1 tablet (40 mg total) by mouth daily at 6 PM. 10/26/14  Yes Estela Isaiah BlakesY Hernandez Acosta, MD  insulin aspart protamine- aspart (NOVOLOG MIX 70/30) (70-30) 100 UNIT/ML injection Inject 0.22 mLs  (22 Units total) into the skin 2 (two) times daily with a meal. 10/26/14  Yes Estela Isaiah BlakesY Hernandez Acosta, MD  metoprolol tartrate (LOPRESSOR) 25 MG tablet Take 0.5 tablets (12.5 mg total) by mouth 2 (two) times daily. 10/26/14  Yes Henderson CloudEstela Y Hernandez Acosta, MD  ondansetron (ZOFRAN) 4 MG tablet Take 1 tablet (4 mg total) by mouth every 6 (six) hours. 10/26/14  Yes Henderson CloudEstela Y Hernandez Acosta, MD  pantoprazole (PROTONIX) 40 MG tablet Take 1 tablet (40 mg total) by mouth daily. 10/26/14  Yes Estela Isaiah BlakesY Hernandez Acosta, MD    ALLERGIES:  Allergies  Allergen Reactions  . Codeine Itching  . Gabapentin Diarrhea and Nausea And Vomiting    SOCIAL HISTORY:  Social History  Substance Use Topics  . Smoking status: Current Some Day Smoker -- 1.00 packs/day for 20 years    Types: Cigarettes  . Smokeless tobacco: Never Used  . Alcohol Use: Yes    FAMILY HISTORY: Family History  Problem Relation Age of Onset  . Heart attack    . Prostate cancer    . Hypertension    . Hypertension Mother   . Cancer Father     EXAM: BP 138/96 mmHg  Pulse 100  Temp(Src) 98.4 F (36.9 C) (Oral)  Resp 24  Ht 6\' 1"  (1.854 m)  Wt 170 lb (77.111 kg)  BMI 22.43 kg/m2  SpO2 97% CONSTITUTIONAL: Alert and oriented and responds appropriately to questions. Appears uncomfortable, holding his forehead and covering his eyes HEAD: Normocephalic EYES: Conjunctivae clear, PERRL,  patient has photophobia ENT: normal nose; no rhinorrhea; moist mucous membranes; pharynx without lesions noted NECK: Supple, no meningismus, no LAD  CARD: Regular and tachycardic; S1 and S2 appreciated; no murmurs, no clicks, no rubs, no gallops RESP: Normal chest excursion without splinting or tachypnea; breath sounds clear and equal bilaterally; no wheezes, no rhonchi, no rales, no hypoxia or respiratory distress, speaking full sentences ABD/GI: Normal bowel sounds; non-distended; soft, non-tender, no rebound, no guarding, no peritoneal signs BACK:  The  back appears normal and is non-tender to palpation, there is no CVA tenderness EXT: Normal ROM in all joints; non-tender to palpation; no edema; normal capillary refill; no cyanosis, no calf tenderness or swelling    SKIN: Normal color for age and race; warm NEURO: Moves all extremities equally, sensation to light touch intact diffusely, cranial nerves II through XII intact PSYCH: The patient's mood and manner are appropriate. Grooming and personal hygiene are appropriate.  MEDICAL DECISION MAKING: Patient here with headache that he describes as the worst headache of his life. It was however gradual onset and started at midnight. No thunderclap headache. He is within a 6 hour timeframe to rule out subarachnoid hemorrhage with CT of his head. He does have a history of migraines reports this does feel similar. Will treat with IV fluids, Reglan, Benadryl. No fever, meningismus on exam. He is neurologically intact.  ED PROGRESS: 7:30 AM  Pt's head CT shows no intracranial hemorrhage. Was performed within 6 hours of patient's symptoms starting therefore intracranial hemorrhage has been ruled out. There is a subtle 3 x 3 x 3 mm focal outpouching arising from the proximal cavernous left ICA suspicious for possible small aneurysm. No other intracranial aneurysm noted. Suspect patient is having a migraine headache. He has had some improvement after Toradol, Reglan and Benadryl. Will give dose of Phenergan and 1 dose of IV morphine to control patient's symptoms.   8:00 AM  Pt reports feeling much better. He feels that he is ready to go home and sleep. We'll give him outpatient neurology follow-up information. Discussed return precautions with patient and wife at bedside. He verbalizes understanding and is comfortable with this plan.  Layla Maw Jerrit Horen, DO 04/16/15 [phone]

## 2015-05-08 ENCOUNTER — Encounter (HOSPITAL_COMMUNITY): Payer: Self-pay | Admitting: *Deleted

## 2015-05-08 ENCOUNTER — Emergency Department (HOSPITAL_COMMUNITY)
Admission: EM | Admit: 2015-05-08 | Discharge: 2015-05-08 | Disposition: A | Discharge status: Home / Self Care | Payer: Self-pay | Attending: Emergency Medicine | Admitting: Emergency Medicine

## 2015-05-08 DIAGNOSIS — F17210 Nicotine dependence, cigarettes, uncomplicated: Secondary | ICD-10-CM | POA: Insufficient documentation

## 2015-05-08 DIAGNOSIS — I509 Heart failure, unspecified: Secondary | ICD-10-CM | POA: Insufficient documentation

## 2015-05-08 DIAGNOSIS — R51 Headache: Secondary | ICD-10-CM | POA: Insufficient documentation

## 2015-05-08 DIAGNOSIS — R739 Hyperglycemia, unspecified: Secondary | ICD-10-CM

## 2015-05-08 DIAGNOSIS — Z794 Long term (current) use of insulin: Secondary | ICD-10-CM | POA: Insufficient documentation

## 2015-05-08 DIAGNOSIS — Z79899 Other long term (current) drug therapy: Secondary | ICD-10-CM | POA: Insufficient documentation

## 2015-05-08 DIAGNOSIS — R519 Headache, unspecified: Secondary | ICD-10-CM

## 2015-05-08 DIAGNOSIS — R531 Weakness: Secondary | ICD-10-CM | POA: Insufficient documentation

## 2015-05-08 DIAGNOSIS — H53149 Visual discomfort, unspecified: Secondary | ICD-10-CM | POA: Insufficient documentation

## 2015-05-08 DIAGNOSIS — E1165 Type 2 diabetes mellitus with hyperglycemia: Secondary | ICD-10-CM | POA: Insufficient documentation

## 2015-05-08 LAB — CBC WITH DIFFERENTIAL/PLATELET
BASOS PCT: 0 %
Basophils Absolute: 0 10*3/uL (ref 0.0–0.1)
EOS PCT: 3 %
Eosinophils Absolute: 0.3 10*3/uL (ref 0.0–0.7)
HCT: 40.3 % (ref 39.0–52.0)
HEMOGLOBIN: 13.2 g/dL (ref 13.0–17.0)
LYMPHS ABS: 1.1 10*3/uL (ref 0.7–4.0)
Lymphocytes Relative: 14 %
MCH: 29.3 pg (ref 26.0–34.0)
MCHC: 32.8 g/dL (ref 30.0–36.0)
MCV: 89.4 fL (ref 78.0–100.0)
MONOS PCT: 8 %
Monocytes Absolute: 0.6 10*3/uL (ref 0.1–1.0)
NEUTROS PCT: 75 %
Neutro Abs: 6 10*3/uL (ref 1.7–7.7)
Platelets: 181 10*3/uL (ref 150–400)
RBC: 4.51 MIL/uL (ref 4.22–5.81)
RDW: 12.8 % (ref 11.5–15.5)
WBC: 8 10*3/uL (ref 4.0–10.5)

## 2015-05-08 LAB — BASIC METABOLIC PANEL
Anion gap: 9 (ref 5–15)
BUN: 12 mg/dL (ref 6–20)
CALCIUM: 8.5 mg/dL — AB (ref 8.9–10.3)
CHLORIDE: 103 mmol/L (ref 101–111)
CO2: 21 mmol/L — AB (ref 22–32)
Creatinine, Ser: 0.77 mg/dL (ref 0.61–1.24)
GFR calc non Af Amer: >60 mL/min (ref >60)
Glucose, Bld: 307 mg/dL — ABNORMAL HIGH (ref 65–99)
Potassium: 4.7 mmol/L (ref 3.5–5.1)
SODIUM: 133 mmol/L — AB (ref 135–145)

## 2015-05-08 MED ORDER — SODIUM CHLORIDE 0.9 % IV BOLUS (SEPSIS)
500.0000 mL | Freq: Once | INTRAVENOUS | Status: AC
  Administered 2015-05-08: 500 mL via INTRAVENOUS

## 2015-05-08 MED ORDER — KETOROLAC TROMETHAMINE 30 MG/ML IJ SOLN
30.0000 mg | Freq: Once | INTRAMUSCULAR | Status: AC
  Administered 2015-05-08: 30 mg via INTRAVENOUS
  Filled 2015-05-08: qty 1

## 2015-05-08 MED ORDER — PROMETHAZINE HCL 25 MG/ML IJ SOLN
25.0000 mg | Freq: Once | INTRAMUSCULAR | Status: AC
  Administered 2015-05-08: 25 mg via INTRAVENOUS
  Filled 2015-05-08: qty 1

## 2015-05-08 MED ORDER — DIPHENHYDRAMINE HCL 50 MG/ML IJ SOLN
25.0000 mg | Freq: Once | INTRAMUSCULAR | Status: AC
  Administered 2015-05-08: 25 mg via INTRAVENOUS
  Filled 2015-05-08: qty 1

## 2015-05-08 NOTE — Discharge Instructions (Signed)
General Headache Without Cause A headache is pain or discomfort felt around the head or neck area. The specific cause of a headache may not be found. There are many causes and types of headaches. A few common ones are:  Tension headaches.  Migraine headaches.  Cluster headaches.  Chronic daily headaches. HOME CARE INSTRUCTIONS  Watch your condition for any changes. Take these steps to help with your condition: Managing Pain  Take over-the-counter and prescription medicines only as told by your health care provider.  Lie down in a dark, quiet room when you have a headache.  If directed, apply ice to the head and neck area:  Put ice in a plastic bag.  Place a towel between your skin and the bag.  Leave the ice on for 20 minutes, 2-3 times per day.  Use a heating pad or hot shower to apply heat to the head and neck area as told by your health care provider.  Keep lights dim if bright lights bother you or make your headaches worse. Eating and Drinking  Eat meals on a regular schedule.  Limit alcohol use.  Decrease the amount of caffeine you drink, or stop drinking caffeine. General Instructions  Keep all follow-up visits as told by your health care provider. This is important.  Keep a headache journal to help find out what may trigger your headaches. For example, write down:  What you eat and drink.  How much sleep you get.  Any change to your diet or medicines.  Try massage or other relaxation techniques.  Limit stress.  Sit up straight, and do not tense your muscles.  Do not use tobacco products, including cigarettes, chewing tobacco, or e-cigarettes. If you need help quitting, ask your health care provider.  Exercise regularly as told by your health care provider.  Sleep on a regular schedule. Get 7-9 hours of sleep, or the amount recommended by your health care provider. SEEK MEDICAL CARE IF:   Your symptoms are not helped by medicine.  You have a  headache that is different from the usual headache.  You have nausea or you vomit.  You have a fever. SEEK IMMEDIATE MEDICAL CARE IF:   Your headache becomes severe.  You have repeated vomiting.  You have a stiff neck.  You have a loss of vision.  You have problems with speech.  You have pain in the eye or ear.  You have muscular weakness or loss of muscle control.  You lose your balance or have trouble walking.  You feel faint or pass out.  You have confusion.   This information is not intended to replace advice given to you by your health care provider. Make sure you discuss any questions you have with your health care provider.   Document Released: 06/10/2005 Document Revised: 03/01/2015 Document Reviewed: 10/03/2014 Elsevier Interactive Patient Education 2016 [employer]  Hyperglycemia Hyperglycemia occurs when the glucose (sugar) in your blood is too high. Hyperglycemia can happen for many reasons, but it most often happens to people who do not know they have diabetes or are not managing their diabetes properly.  CAUSES  Whether you have diabetes or not, there are other causes of hyperglycemia. Hyperglycemia can occur when you have diabetes, but it can also occur in other situations that you might not be as aware of, such as: Diabetes  If you have diabetes and are having problems controlling your blood glucose, hyperglycemia could occur because of some of the following reasons:  Not following  your meal plan.  Not taking your diabetes medications or not taking it properly.  Exercising less or doing less activity than you normally do.  Being sick. Pre-diabetes  This cannot be ignored. Before people develop Type 2 diabetes, they almost always have "pre-diabetes." This is when your blood glucose levels are higher than normal, but not yet high enough to be diagnosed as diabetes. Research has shown that some long-term damage to the body, especially the heart and  circulatory system, may already be occurring during pre-diabetes. If you take action to manage your blood glucose when you have pre-diabetes, you may delay or prevent Type 2 diabetes from developing. Stress  If you have diabetes, you may be "diet" controlled or on oral medications or insulin to control your diabetes. However, you may find that your blood glucose is higher than usual in the hospital whether you have diabetes or not. This is often referred to as "stress hyperglycemia." Stress can elevate your blood glucose. This happens because of hormones put out by the body during times of stress. If stress has been the cause of your high blood glucose, it can be followed regularly by your caregiver. That way he/she can make sure your hyperglycemia does not continue to get worse or progress to diabetes. Steroids  Steroids are medications that act on the infection fighting system (immune system) to block inflammation or infection. One side effect can be a rise in blood glucose. Most people can produce enough extra insulin to allow for this rise, but for those who cannot, steroids make blood glucose levels go even higher. It is not unusual for steroid treatments to "uncover" diabetes that is developing. It is not always possible to determine if the hyperglycemia will go away after the steroids are stopped. A special blood test called an A1c is sometimes done to determine if your blood glucose was elevated before the steroids were started. SYMPTOMS  Thirsty.  Frequent urination.  Dry mouth.  Blurred vision.  Tired or fatigue.  Weakness.  Sleepy.  Tingling in feet or leg. DIAGNOSIS  Diagnosis is made by monitoring blood glucose in one or all of the following ways:  A1c test. This is a chemical found in your blood.  Fingerstick blood glucose monitoring.  Laboratory results. TREATMENT  First, knowing the cause of the hyperglycemia is important before the hyperglycemia can be treated.  Treatment may include, but is not be limited to:  Education.  Change or adjustment in medications.  Change or adjustment in meal plan.  Treatment for an illness, infection, etc.  More frequent blood glucose monitoring.  Change in exercise plan.  Decreasing or stopping steroids.  Lifestyle changes. HOME CARE INSTRUCTIONS   Test your blood glucose as directed.  Exercise regularly. Your caregiver will give you instructions about exercise. Pre-diabetes or diabetes which comes on with stress is helped by exercising.  Eat wholesome, balanced meals. Eat often and at regular, fixed times. Your caregiver or nutritionist will give you a meal plan to guide your sugar intake.  Being at an ideal weight is important. If needed, losing as little as 10 to 15 pounds may help improve blood glucose levels. SEEK MEDICAL CARE IF:   You have questions about medicine, activity, or diet.  You continue to have symptoms (problems such as increased thirst, urination, or weight gain). SEEK IMMEDIATE MEDICAL CARE IF:   You are vomiting or have diarrhea.  Your breath smells fruity.  You are breathing faster or slower.  You are very  sleepy or incoherent.  You have numbness, tingling, or pain in your feet or hands.  You have chest pain.  Your symptoms get worse even though you have been following your caregiver's orders.  If you have any other questions or concerns.   This information is not intended to replace advice given to you by your health care provider. Make sure you discuss any questions you have with your health care provider.   Document Released: 12/04/2000 Document Revised: 09/02/2011 Document Reviewed: 02/14/2015 Elsevier Interactive Patient Education [employer]

## 2015-05-08 NOTE — ED Notes (Signed)
Patient c/o migraine that started this morning, reports sound and light sensitivity.

## 2015-05-08 NOTE — ED Provider Notes (Signed)
CSN: [id number]     Arrival date & time 05/08/15  1153 History   First MD Initiated Contact with Patient 05/08/15 1408     Chief Complaint  Patient presents with  . Migraine     (Consider location/radiation/quality/duration/timing/severity/associated sxs/prior Treatment) Patient is a 49 y.o. male presenting with migraines. The history is provided by the patient.  Migraine This is a recurrent problem. The current episode started 6 to 12 hours ago. Associated symptoms include headaches. Pertinent negatives include no chest pain and no abdominal pain.   patient has a headache. He's had headaches like this before. States he began around 6 hours ago. Began behind his right eye now involves his whole head. Is throbbing. Does have photophobia and sonophobia. History of headaches been like this. Has had nausea without vomiting. No fevers. No trauma. States he does have some mild weakness in his left arm. States he has pain in the left shoulder. States he does not usually get weakness with his headache. He was seen in the ER previously and had a CT angiography that showed a small internal carotid artery aneurysm. He has not been able follow up in neurology due to money issues. He does have a history of left shoulder and neck pain.  Past Medical History  Diagnosis Date  . Diabetes mellitus without complication (HCC)   . CHF (congestive heart failure) (HCC)    History reviewed. No pertinent past surgical history. Family History  Problem Relation Age of Onset  . Heart attack    . Prostate cancer    . Hypertension    . Hypertension Mother   . Cancer Father    Social History  Substance Use Topics  . Smoking status: Current Some Day Smoker -- 1.00 packs/day for 20 years    Types: Cigarettes  . Smokeless tobacco: Never Used  . Alcohol Use: Yes    Review of Systems  Constitutional: Negative for appetite change.  Eyes: Positive for photophobia.  Respiratory: Negative for chest tightness.    Cardiovascular: Negative for chest pain.  Gastrointestinal: Negative for abdominal pain.  Musculoskeletal: Negative for back pain.  Skin: Negative for wound.  Neurological: Positive for weakness and headaches. Negative for seizures, speech difficulty and light-headedness.      Allergies  Codeine and Gabapentin  Home Medications   Prior to Admission medications   Medication Sig Start Date End Date Taking? Authorizing Provider  amitriptyline (ELAVIL) 50 MG tablet Take 50 mg by mouth at bedtime.   Yes Historical Provider, MD  ibuprofen (ADVIL,MOTRIN) 800 MG tablet Take 1 tablet (800 mg total) by mouth every 8 (eight) hours as needed for mild pain. 04/16/15  Yes Kristen N Ward, DO  insulin aspart protamine- aspart (NOVOLOG MIX 70/30) (70-30) 100 UNIT/ML injection Inject 0.22 mLs (22 Units total) into the skin 2 (two) times daily with a meal. 10/26/14  Yes Estela Isaiah BlakesY Hernandez Acosta, MD  promethazine (PHENERGAN) 25 MG tablet Take 1 tablet (25 mg total) by mouth every 6 (six) hours as needed for nausea or vomiting. 04/16/15  Yes Kristen N Ward, DO  alum & mag hydroxide-simeth (MAALOX/MYLANTA) 200-200-20 MG/5ML suspension Take 30 mLs by mouth every 6 (six) hours as needed for indigestion or heartburn. Patient not taking: Reported on 05/08/2015 10/26/14   Henderson CloudEstela Y Hernandez Acosta, MD  atorvastatin (LIPITOR) 40 MG tablet Take 1 tablet (40 mg total) by mouth daily at 6 PM. Patient not taking: Reported on 05/08/2015 10/26/14   Henderson CloudEstela Y Hernandez Acosta, MD  metoprolol  tartrate (LOPRESSOR) 25 MG tablet Take 0.5 tablets (12.5 mg total) by mouth 2 (two) times daily. Patient not taking: Reported on 05/08/2015 10/26/14   Henderson Cloud, MD  ondansetron (ZOFRAN) 4 MG tablet Take 1 tablet (4 mg total) by mouth every 6 (six) hours. Patient not taking: Reported on 05/08/2015 10/26/14   Henderson Cloud, MD  pantoprazole (PROTONIX) 40 MG tablet Take 1 tablet (40 mg total) by mouth daily. Patient  not taking: Reported on 05/08/2015 10/26/14   Henderson Cloud, MD   BP 142/90 mmHg  Pulse 103  Temp(Src) 98.1 F (36.7 C) (Oral)  Resp 18  Ht  (1.854 m)  Wt 160 lb (72.576 kg)  BMI 21.11 kg/m2  SpO2 97% Physical Exam  Constitutional: He appears well-developed.  HENT:  Head: Atraumatic.  Eyes: EOM are normal.  Neck: Neck supple.  Cardiovascular: Normal rate.   Pulmonary/Chest: Effort normal.  Abdominal: Soft.  Musculoskeletal:  No midline cervical tenderness. Some tenderness over left trapezius.  Neurological: He is alert. No cranial nerve deficit.  Slightly decreased left hand grip strength compared to right side. Sensation intact.  Skin: Skin is warm.    ED Course  Procedures (including critical care time) Labs Review Labs Reviewed  BASIC METABOLIC PANEL - Abnormal; Notable for the following:    Sodium 133 (*)    CO2 21 (*)    Glucose, Bld 307 (*)    Calcium 8.5 (*)    All other components within normal limits  CBC WITH DIFFERENTIAL/PLATELET    Imaging Review No results found. I have personally reviewed and evaluated these images and lab results as part of my medical decision-making.   EKG Interpretation None      MDM   Final diagnoses:  Nonintractable headache, unspecified chronicity pattern, unspecified headache type  Hyperglycemia    Patient with headache. Feels better after treatment. History of same. Has had a small internal carotid aneurysm likely too small to need treatment. Has not had neurology follow-up. Mild hyperglycemia also. Will discharge home. May be complex migraine with slight hand weakness. Doubt severe intracranial abnormality. This time.    Benjiman Core, MD 05/09/15 2220

## 2015-05-23 ENCOUNTER — Emergency Department (HOSPITAL_BASED_OUTPATIENT_CLINIC_OR_DEPARTMENT_OTHER)
Admission: EM | Admit: 2015-05-23 | Discharge: 2015-05-23 | Disposition: A | Discharge status: Home / Self Care | Payer: Self-pay | Attending: Emergency Medicine | Admitting: Emergency Medicine

## 2015-05-23 ENCOUNTER — Emergency Department (HOSPITAL_BASED_OUTPATIENT_CLINIC_OR_DEPARTMENT_OTHER): Payer: Self-pay

## 2015-05-23 ENCOUNTER — Encounter (HOSPITAL_BASED_OUTPATIENT_CLINIC_OR_DEPARTMENT_OTHER): Payer: Self-pay

## 2015-05-23 DIAGNOSIS — Z79899 Other long term (current) drug therapy: Secondary | ICD-10-CM | POA: Insufficient documentation

## 2015-05-23 DIAGNOSIS — Z87891 Personal history of nicotine dependence: Secondary | ICD-10-CM | POA: Insufficient documentation

## 2015-05-23 DIAGNOSIS — X58XXXA Exposure to other specified factors, initial encounter: Secondary | ICD-10-CM | POA: Insufficient documentation

## 2015-05-23 DIAGNOSIS — S40811A Abrasion of right upper arm, initial encounter: Secondary | ICD-10-CM | POA: Insufficient documentation

## 2015-05-23 DIAGNOSIS — R739 Hyperglycemia, unspecified: Secondary | ICD-10-CM

## 2015-05-23 DIAGNOSIS — K0889 Other specified disorders of teeth and supporting structures: Secondary | ICD-10-CM | POA: Insufficient documentation

## 2015-05-23 DIAGNOSIS — S40812A Abrasion of left upper arm, initial encounter: Secondary | ICD-10-CM | POA: Insufficient documentation

## 2015-05-23 DIAGNOSIS — H9201 Otalgia, right ear: Secondary | ICD-10-CM | POA: Insufficient documentation

## 2015-05-23 DIAGNOSIS — Y9389 Activity, other specified: Secondary | ICD-10-CM | POA: Insufficient documentation

## 2015-05-23 DIAGNOSIS — E1165 Type 2 diabetes mellitus with hyperglycemia: Secondary | ICD-10-CM | POA: Insufficient documentation

## 2015-05-23 DIAGNOSIS — I509 Heart failure, unspecified: Secondary | ICD-10-CM | POA: Insufficient documentation

## 2015-05-23 DIAGNOSIS — Z794 Long term (current) use of insulin: Secondary | ICD-10-CM | POA: Insufficient documentation

## 2015-05-23 DIAGNOSIS — Y9289 Other specified places as the place of occurrence of the external cause: Secondary | ICD-10-CM | POA: Insufficient documentation

## 2015-05-23 DIAGNOSIS — K029 Dental caries, unspecified: Secondary | ICD-10-CM | POA: Insufficient documentation

## 2015-05-23 DIAGNOSIS — S2231XA Fracture of one rib, right side, initial encounter for closed fracture: Secondary | ICD-10-CM | POA: Insufficient documentation

## 2015-05-23 DIAGNOSIS — Y998 Other external cause status: Secondary | ICD-10-CM | POA: Insufficient documentation

## 2015-05-23 LAB — CBC WITH DIFFERENTIAL/PLATELET
Basophils Absolute: 0 10*3/uL (ref 0.0–0.1)
Basophils Relative: 1 %
EOS ABS: 0.2 10*3/uL (ref 0.0–0.7)
EOS PCT: 5 %
HCT: 40.6 % (ref 39.0–52.0)
Hemoglobin: 13.3 g/dL (ref 13.0–17.0)
LYMPHS ABS: 0.8 10*3/uL (ref 0.7–4.0)
LYMPHS PCT: 16 %
MCH: 28.4 pg (ref 26.0–34.0)
MCHC: 32.8 g/dL (ref 30.0–36.0)
MCV: 86.6 fL (ref 78.0–100.0)
Monocytes Absolute: 0.4 10*3/uL (ref 0.1–1.0)
Monocytes Relative: 10 %
Neutro Abs: 3.1 10*3/uL (ref 1.7–7.7)
Neutrophils Relative %: 68 %
Platelets: 200 10*3/uL (ref 150–400)
RBC: 4.69 MIL/uL (ref 4.22–5.81)
RDW: 12.4 % (ref 11.5–15.5)
WBC: 4.6 10*3/uL (ref 4.0–10.5)

## 2015-05-23 LAB — CBG MONITORING, ED
GLUCOSE-CAPILLARY: 355 mg/dL — AB (ref 65–99)
GLUCOSE-CAPILLARY: 289 mg/dL — AB (ref 65–99)

## 2015-05-23 LAB — COMPREHENSIVE METABOLIC PANEL
ALT: 14 U/L — AB (ref 17–63)
ANION GAP: 9 (ref 5–15)
AST: 10 U/L — ABNORMAL LOW (ref 15–41)
Albumin: 3.8 g/dL (ref 3.5–5.0)
Alkaline Phosphatase: 83 U/L (ref 38–126)
BUN: 12 mg/dL (ref 6–20)
CALCIUM: 9 mg/dL (ref 8.9–10.3)
CO2: 25 mmol/L (ref 22–32)
CREATININE: 0.81 mg/dL (ref 0.61–1.24)
Chloride: 100 mmol/L — ABNORMAL LOW (ref 101–111)
Glucose, Bld: 382 mg/dL — ABNORMAL HIGH (ref 65–99)
Potassium: 4.4 mmol/L (ref 3.5–5.1)
SODIUM: 134 mmol/L — AB (ref 135–145)
Total Bilirubin: 0.7 mg/dL (ref 0.3–1.2)
Total Protein: 6.9 g/dL (ref 6.5–8.1)

## 2015-05-23 MED ORDER — SODIUM CHLORIDE 0.9 % IV BOLUS (SEPSIS)
1000.0000 mL | Freq: Once | INTRAVENOUS | Status: AC
  Administered 2015-05-23: 1000 mL via INTRAVENOUS

## 2015-05-23 MED ORDER — PENICILLIN V POTASSIUM 250 MG PO TABS: 250.0000 mg | ORAL_TABLET | Freq: Four times a day (QID) | ORAL | Status: AC

## 2015-05-23 MED ORDER — HYDROCODONE-ACETAMINOPHEN 5-325 MG PO TABS: 1.0000 | ORAL_TABLET | Freq: Four times a day (QID) | ORAL | Status: DC | PRN

## 2015-05-23 NOTE — Discharge Instructions (Signed)
Rib Fracture °A rib fracture is a break or crack in one of the bones of the ribs. The ribs are like a cage that goes around your upper chest. A broken or cracked rib is often painful, but most do not cause other problems. Most rib fractures heal on their own in 1-3 months. °HOME CARE °· Avoid activities that cause pain to the injured area. Protect your injured area. °· Slowly increase activity as told by your doctor. °· Take medicine as told by your doctor. °· Put ice on the injured area for the first 1-2 days after you have been treated or as told by your doctor. °¨ Put ice in a plastic bag. °¨ Place a towel between your skin and the bag. °¨ Leave the ice on for 15-20 minutes at a time, every 2 hours while you are awake. °· Do deep breathing as told by your doctor. You may be told to: °¨ Take deep breaths many times a day. °¨ Cough many times a day while hugging a pillow. °¨ Use a device (incentive spirometer) to perform deep breathing many times a day. °· Drink enough fluids to keep your pee (urine) clear or pale yellow.   °· Do not wear a rib belt or binder. These do not allow you to breathe deeply. °GET HELP RIGHT AWAY IF:  °· You have a fever. °· You have trouble breathing.   °· You cannot stop coughing. °· You cough up thick or bloody spit (mucus).   °· You feel sick to your stomach (nauseous), throw up (vomit), or have belly (abdominal) pain.   °· Your pain gets worse and medicine does not help.   °MAKE SURE YOU:  °· Understand these instructions. °· Will watch your condition. °· Will get help right away if you are not doing well or get worse. °  °This information is not intended to replace advice given to you by your health care provider. Make sure you discuss any questions you have with your health care provider. °  °Document Released: 03/19/2008 Document Revised: 10/05/2012 Document Reviewed: 08/12/2012 °Elsevier Interactive Patient Education ©2016 Elsevier Inc. ° °

## 2015-05-23 NOTE — ED Notes (Addendum)
C/o right ear ache, sore throat since Sunday-c/o swelling to tongue yesterday-no resp distress-steady gait-NAD

## 2015-05-23 NOTE — ED Provider Notes (Signed)
CSN: [id number]     Arrival date & time 05/23/15  1230 History   First MD Initiated Contact with Patient 05/23/15 1334     Chief Complaint  Patient presents with  . URI     (Consider location/radiation/quality/duration/timing/severity/associated sxs/prior Treatment) HPI Comments: Pt comes in today with multiple complaints. He is c/o right ear and tooth pain with problems swallowing. No fever. He states that he was working on a car and it slipped so he thinks he has a fracture to the right rib. He states that he has also had bilateral hand swelling. He state that he is not sure of what his blood sugar has been  The history is provided by the patient. No language interpreter was used.    Past Medical History  Diagnosis Date  . Diabetes mellitus without complication (HCC)   . CHF (congestive heart failure) (HCC)    History reviewed. No pertinent past surgical history. Family History  Problem Relation Age of Onset  . Heart attack    . Prostate cancer    . Hypertension    . Hypertension Mother   . Cancer Father    Social History  Substance Use Topics  . Smoking status: Former Smoker -- 1.00 packs/day for 20 years    Types: Cigarettes  . Smokeless tobacco: Never Used  . Alcohol Use: No    Review of Systems  All other systems reviewed and are negative.     Allergies  Codeine and Gabapentin  Home Medications   Prior to Admission medications   Medication Sig Start Date End Date Taking? Authorizing Provider  alum & mag hydroxide-simeth (MAALOX/MYLANTA) 200-200-20 MG/5ML suspension Take 30 mLs by mouth every 6 (six) hours as needed for indigestion or heartburn. Patient not taking: Reported on 05/08/2015 10/26/14   Henderson CloudEstela Y Hernandez Acosta, MD  amitriptyline (ELAVIL) 50 MG tablet Take 50 mg by mouth at bedtime.    Historical Provider, MD  atorvastatin (LIPITOR) 40 MG tablet Take 1 tablet (40 mg total) by mouth daily at 6 PM. Patient not taking: Reported on 05/08/2015 10/26/14    Henderson CloudEstela Y Hernandez Acosta, MD  ibuprofen (ADVIL,MOTRIN) 800 MG tablet Take 1 tablet (800 mg total) by mouth every 8 (eight) hours as needed for mild pain. 04/16/15   Kristen N Ward, DO  insulin aspart protamine- aspart (NOVOLOG MIX 70/30) (70-30) 100 UNIT/ML injection Inject 0.22 mLs (22 Units total) into the skin 2 (two) times daily with a meal. 10/26/14   Estela Isaiah BlakesY Hernandez Acosta, MD  metoprolol tartrate (LOPRESSOR) 25 MG tablet Take 0.5 tablets (12.5 mg total) by mouth 2 (two) times daily. Patient not taking: Reported on 05/08/2015 10/26/14   Henderson CloudEstela Y Hernandez Acosta, MD  ondansetron (ZOFRAN) 4 MG tablet Take 1 tablet (4 mg total) by mouth every 6 (six) hours. Patient not taking: Reported on 05/08/2015 10/26/14   Henderson CloudEstela Y Hernandez Acosta, MD  pantoprazole (PROTONIX) 40 MG tablet Take 1 tablet (40 mg total) by mouth daily. Patient not taking: Reported on 05/08/2015 10/26/14   Henderson CloudEstela Y Hernandez Acosta, MD  promethazine (PHENERGAN) 25 MG tablet Take 1 tablet (25 mg total) by mouth every 6 (six) hours as needed for nausea or vomiting. 04/16/15   Kristen N Ward, DO   BP 120/92 mmHg  Pulse 123  Temp(Src) 97.7 F (36.5 C) (Oral)  Resp 20  Ht 6\' 1"  (1.854 m)  Wt 79.379 kg  BMI 23.09 kg/m2  SpO2 93% Physical Exam  Constitutional: He appears well-developed and well-nourished.  HENT:  Generalized decay without swelling noted  Cardiovascular: Normal rate and regular rhythm.   Pulmonary/Chest: Effort normal and breath sounds normal.  Abdominal: Soft. Bowel sounds are normal. There is no tenderness.  Musculoskeletal:  No gross swelling to hands noted. They are equal in size  Neurological: He is alert. He exhibits normal muscle tone. Coordination normal.  Skin:  Multiple scabbed areas to arms and hands  Psychiatric: He has a normal mood and affect.  Nursing note and vitals reviewed.   ED Course  Procedures (including critical care time) Labs Review Labs Reviewed  COMPREHENSIVE METABOLIC  PANEL - Abnormal; Notable for the following:    Sodium 134 (*)    Chloride 100 (*)    Glucose, Bld 382 (*)    AST 10 (*)    ALT 14 (*)    All other components within normal limits  CBG MONITORING, ED - Abnormal; Notable for the following:    Glucose-Capillary 355 (*)    All other components within normal limits  CBG MONITORING, ED - Abnormal; Notable for the following:    Glucose-Capillary 289 (*)    All other components within normal limits  CBC WITH DIFFERENTIAL/PLATELET  CBG MONITORING, ED    Imaging Review Dg Ribs Unilateral W/chest Right  05/23/2015  CLINICAL DATA:  Acute onset right-sided chest pain, occurring while bending 2 days prior EXAM: RIGHT RIBS AND CHEST - 3+ VIEW COMPARISON:  Chest radiograph July 31, 2014 FINDINGS: Frontal chest as well as oblique and cone-down lower rib images were obtained. Lungs are clear. Heart size and pulmonary vascular normal. No adenopathy. There is no pneumothorax or effusion. The is a nondisplaced fracture of the anterior right tenth rib. No other acute fracture evident. There is old healed trauma involving the mid left clavicle. IMPRESSION: Nondisplaced fracture anterior right tenth rib. No pneumothorax. Lungs clear. Electronically Signed   By: Bretta Bang III M.D.   On: 05/23/2015 14:19   I have personally reviewed and evaluated these images and lab results as part of my medical decision-making.   EKG Interpretation None      MDM   Final diagnoses:  Rib fracture, right, closed, initial encounter  Toothache  Hyperglycemia    Pt not in dka. Blood sugar down. Will send home on pcn for dental pain and hydrocodone for pain.discused follow up with pcp    Teressa Lower, NP 05/23/15 1541  Benjiman Core, MD 05/23/15 [phone]

## 2015-08-02 ENCOUNTER — Inpatient Hospital Stay (HOSPITAL_COMMUNITY)
Admission: EM | Admit: 2015-08-02 | Discharge: 2015-08-04 | DRG: 638 | Disposition: A | Discharge status: Home / Self Care | Payer: Self-pay | Attending: Internal Medicine | Admitting: Internal Medicine

## 2015-08-02 ENCOUNTER — Encounter (HOSPITAL_COMMUNITY): Payer: Self-pay | Admitting: Emergency Medicine

## 2015-08-02 ENCOUNTER — Emergency Department (HOSPITAL_COMMUNITY): Payer: Self-pay

## 2015-08-02 DIAGNOSIS — K219 Gastro-esophageal reflux disease without esophagitis: Secondary | ICD-10-CM | POA: Diagnosis present

## 2015-08-02 DIAGNOSIS — R52 Pain, unspecified: Secondary | ICD-10-CM

## 2015-08-02 DIAGNOSIS — E1100 Type 2 diabetes mellitus with hyperosmolarity without nonketotic hyperglycemic-hyperosmolar coma (NKHHC): Principal | ICD-10-CM | POA: Diagnosis present

## 2015-08-02 DIAGNOSIS — R6510 Systemic inflammatory response syndrome (SIRS) of non-infectious origin without acute organ dysfunction: Secondary | ICD-10-CM | POA: Diagnosis present

## 2015-08-02 DIAGNOSIS — I959 Hypotension, unspecified: Secondary | ICD-10-CM | POA: Diagnosis present

## 2015-08-02 DIAGNOSIS — Z886 Allergy status to analgesic agent status: Secondary | ICD-10-CM

## 2015-08-02 DIAGNOSIS — D72829 Elevated white blood cell count, unspecified: Secondary | ICD-10-CM | POA: Diagnosis present

## 2015-08-02 DIAGNOSIS — Z87891 Personal history of nicotine dependence: Secondary | ICD-10-CM

## 2015-08-02 DIAGNOSIS — R000 Tachycardia, unspecified: Secondary | ICD-10-CM | POA: Diagnosis present

## 2015-08-02 DIAGNOSIS — I471 Supraventricular tachycardia: Secondary | ICD-10-CM | POA: Diagnosis present

## 2015-08-02 DIAGNOSIS — Z794 Long term (current) use of insulin: Secondary | ICD-10-CM

## 2015-08-02 DIAGNOSIS — E860 Dehydration: Secondary | ICD-10-CM | POA: Diagnosis present

## 2015-08-02 DIAGNOSIS — Z888 Allergy status to other drugs, medicaments and biological substances status: Secondary | ICD-10-CM

## 2015-08-02 DIAGNOSIS — E1165 Type 2 diabetes mellitus with hyperglycemia: Secondary | ICD-10-CM | POA: Diagnosis present

## 2015-08-02 DIAGNOSIS — I10 Essential (primary) hypertension: Secondary | ICD-10-CM | POA: Diagnosis present

## 2015-08-02 LAB — I-STAT VENOUS BLOOD GAS, ED
ACID-BASE EXCESS: 2 mmol/L (ref 0.0–2.0)
BICARBONATE: 24.9 meq/L — AB (ref 20.0–24.0)
O2 Saturation: 77 %
PH VEN: 7.467 — AB (ref 7.250–7.300)
PO2 VEN: 39 mmHg (ref 30.0–45.0)
TCO2: 26 mmol/L (ref 0–100)
pCO2, Ven: 34.5 mmHg — ABNORMAL LOW (ref 45.0–50.0)

## 2015-08-02 LAB — I-STAT TROPONIN, ED: Troponin i, poc: 0 ng/mL (ref 0.00–0.08)

## 2015-08-02 LAB — GLUCOSE, CAPILLARY
Glucose-Capillary: 60 mg/dL — ABNORMAL LOW (ref 65–99)
Glucose-Capillary: 156 mg/dL — ABNORMAL HIGH (ref 65–99)
Glucose-Capillary: 318 mg/dL — ABNORMAL HIGH (ref 65–99)

## 2015-08-02 LAB — BRAIN NATRIURETIC PEPTIDE: B NATRIURETIC PEPTIDE 5: 30.1 pg/mL (ref 0.0–100.0)

## 2015-08-02 LAB — CBC WITH DIFFERENTIAL/PLATELET
BAND NEUTROPHILS: 0 %
BASOS ABS: 0 10*3/uL (ref 0.0–0.1)
BLASTS: 0 %
Basophils Relative: 0 %
EOS ABS: 0 10*3/uL (ref 0.0–0.7)
Eosinophils Relative: 0 %
HEMATOCRIT: 39.4 % (ref 39.0–52.0)
HEMOGLOBIN: 13.3 g/dL (ref 13.0–17.0)
Lymphocytes Relative: 2 %
Lymphs Abs: 0.3 10*3/uL — ABNORMAL LOW (ref 0.7–4.0)
MCH: 29.3 pg (ref 26.0–34.0)
MCHC: 33.8 g/dL (ref 30.0–36.0)
MCV: 86.8 fL (ref 78.0–100.0)
METAMYELOCYTES PCT: 0 %
MONOS PCT: 15 %
MYELOCYTES: 0 %
Monocytes Absolute: 2.6 10*3/uL — ABNORMAL HIGH (ref 0.1–1.0)
NEUTROS ABS: 14.1 10*3/uL — AB (ref 1.7–7.7)
Neutrophils Relative %: 83 %
Other: 0 %
PROMYELOCYTES ABS: 0 %
Platelets: 203 10*3/uL (ref 150–400)
RBC: 4.54 MIL/uL (ref 4.22–5.81)
RDW: 12.9 % (ref 11.5–15.5)
WBC MORPHOLOGY: INCREASED
WBC: 17 10*3/uL — AB (ref 4.0–10.5)
nRBC: 0 /100 WBC

## 2015-08-02 LAB — COMPREHENSIVE METABOLIC PANEL
ALBUMIN: 3.5 g/dL (ref 3.5–5.0)
ALK PHOS: 80 U/L (ref 38–126)
ALT: 16 U/L — AB (ref 17–63)
AST: 17 U/L (ref 15–41)
Anion gap: 13 (ref 5–15)
BILIRUBIN TOTAL: 1 mg/dL (ref 0.3–1.2)
BUN: 11 mg/dL (ref 6–20)
CO2: 26 mmol/L (ref 22–32)
CREATININE: 1.23 mg/dL (ref 0.61–1.24)
Calcium: 9.3 mg/dL (ref 8.9–10.3)
Chloride: 100 mmol/L — ABNORMAL LOW (ref 101–111)
GFR calc Af Amer: >60 mL/min (ref >60)
GLUCOSE: 192 mg/dL — AB (ref 65–99)
POTASSIUM: 3.7 mmol/L (ref 3.5–5.1)
Sodium: 139 mmol/L (ref 135–145)
TOTAL PROTEIN: 6.7 g/dL (ref 6.5–8.1)

## 2015-08-02 LAB — URINALYSIS, ROUTINE W REFLEX MICROSCOPIC
Glucose, UA: 100 mg/dL — AB
HGB URINE DIPSTICK: NEGATIVE
Ketones, ur: 15 mg/dL — AB
NITRITE: NEGATIVE
PH: 6.5 (ref 5.0–8.0)
Protein, ur: NEGATIVE mg/dL
SPECIFIC GRAVITY, URINE: 1.042 — AB (ref 1.005–1.030)

## 2015-08-02 LAB — RAPID URINE DRUG SCREEN, HOSP PERFORMED
AMPHETAMINES: NOT DETECTED
BENZODIAZEPINES: NOT DETECTED
Barbiturates: NOT DETECTED
Cocaine: POSITIVE — AB
OPIATES: POSITIVE — AB
TETRAHYDROCANNABINOL: NOT DETECTED

## 2015-08-02 LAB — TROPONIN I: Troponin I: <0.03 ng/mL (ref <0.031)

## 2015-08-02 LAB — URINE MICROSCOPIC-ADD ON

## 2015-08-02 LAB — LIPASE, BLOOD: LIPASE: 16 U/L (ref 11–51)

## 2015-08-02 LAB — LACTIC ACID, PLASMA: Lactic Acid, Venous: 1.2 mmol/L (ref 0.5–2.0)

## 2015-08-02 LAB — CBG MONITORING, ED: Glucose-Capillary: 194 mg/dL — ABNORMAL HIGH (ref 65–99)

## 2015-08-02 MED ORDER — METOPROLOL TARTRATE 12.5 MG HALF TABLET
12.5000 mg | ORAL_TABLET | Freq: Two times a day (BID) | ORAL | Status: DC
  Administered 2015-08-02: 12.5 mg via ORAL
  Filled 2015-08-02: qty 1

## 2015-08-02 MED ORDER — DIPHENHYDRAMINE HCL 50 MG/ML IJ SOLN
25.0000 mg | Freq: Once | INTRAMUSCULAR | Status: AC
  Administered 2015-08-02: 25 mg via INTRAVENOUS
  Filled 2015-08-02: qty 1

## 2015-08-02 MED ORDER — ONDANSETRON HCL 4 MG PO TABS: 4.0000 mg | ORAL_TABLET | Freq: Four times a day (QID) | ORAL | Status: DC | PRN

## 2015-08-02 MED ORDER — INSULIN ASPART 100 UNIT/ML ~~LOC~~ SOLN
4.0000 [IU] | Freq: Once | SUBCUTANEOUS | Status: AC
  Administered 2015-08-02: 4 [IU] via SUBCUTANEOUS

## 2015-08-02 MED ORDER — ONDANSETRON HCL 4 MG/2ML IJ SOLN: 4.0000 mg | Freq: Four times a day (QID) | INTRAMUSCULAR | Status: DC | PRN

## 2015-08-02 MED ORDER — METOCLOPRAMIDE HCL 5 MG/ML IJ SOLN
10.0000 mg | INTRAMUSCULAR | Status: AC
  Administered 2015-08-02: 10 mg via INTRAVENOUS
  Filled 2015-08-02: qty 2

## 2015-08-02 MED ORDER — IOHEXOL 350 MG/ML SOLN
100.0000 mL | Freq: Once | INTRAVENOUS | Status: AC | PRN
  Administered 2015-08-02: 100 mL via INTRAVENOUS

## 2015-08-02 MED ORDER — SODIUM CHLORIDE 0.9 % IV BOLUS (SEPSIS)
1000.0000 mL | Freq: Once | INTRAVENOUS | Status: AC
  Administered 2015-08-02: 1000 mL via INTRAVENOUS

## 2015-08-02 MED ORDER — ACETAMINOPHEN 650 MG RE SUPP: 650.0000 mg | Freq: Four times a day (QID) | RECTAL | Status: DC | PRN

## 2015-08-02 MED ORDER — METOCLOPRAMIDE HCL 5 MG/ML IJ SOLN
10.0000 mg | Freq: Once | INTRAMUSCULAR | Status: AC
Start: 2015-08-02 — End: 2015-08-02
  Administered 2015-08-02: 10 mg via INTRAVENOUS
  Filled 2015-08-02: qty 2

## 2015-08-02 MED ORDER — ACETAMINOPHEN 325 MG PO TABS
650.0000 mg | ORAL_TABLET | Freq: Four times a day (QID) | ORAL | Status: DC | PRN
  Administered 2015-08-02 – 2015-08-03 (×2): 650 mg via ORAL
  Filled 2015-08-02 (×2): qty 2

## 2015-08-02 MED ORDER — AMITRIPTYLINE HCL 50 MG PO TABS
75.0000 mg | ORAL_TABLET | Freq: Every day | ORAL | Status: DC
  Administered 2015-08-02 – 2015-08-03 (×2): 75 mg via ORAL
  Filled 2015-08-02 (×2): qty 2

## 2015-08-02 MED ORDER — ENOXAPARIN SODIUM 40 MG/0.4ML ~~LOC~~ SOLN
40.0000 mg | SUBCUTANEOUS | Status: DC
  Administered 2015-08-02 – 2015-08-03 (×2): 40 mg via SUBCUTANEOUS
  Filled 2015-08-02 (×3): qty 0.4

## 2015-08-02 MED ORDER — INSULIN ASPART PROT & ASPART (70-30 MIX) 100 UNIT/ML ~~LOC~~ SUSP
35.0000 [IU] | Freq: Two times a day (BID) | SUBCUTANEOUS | Status: DC
  Filled 2015-08-02: qty 10

## 2015-08-02 MED ORDER — SODIUM CHLORIDE 0.9 % IV SOLN
INTRAVENOUS | Status: DC
  Administered 2015-08-03: 13:00:00 via INTRAVENOUS

## 2015-08-02 MED ORDER — KETOROLAC TROMETHAMINE 30 MG/ML IJ SOLN
30.0000 mg | Freq: Once | INTRAMUSCULAR | Status: AC
  Administered 2015-08-02: 30 mg via INTRAVENOUS
  Filled 2015-08-02: qty 1

## 2015-08-02 NOTE — ED Notes (Signed)
2W to call back for report

## 2015-08-02 NOTE — Progress Notes (Signed)
Received report from [place], [place] - ED.

## 2015-08-02 NOTE — ED Notes (Signed)
PT reports pain 5/10 for headache. PT reports he is no longer dizzy or nauseated and his left arm is no longer hurting.

## 2015-08-02 NOTE — Progress Notes (Signed)
Pt arrived to unit. VSS. No complaints of pain. Pt oriented to room. Will continue to monitor closely.  Sandrea Hammond RN

## 2015-08-02 NOTE — ED Notes (Signed)
Paul Hoffman, NP at bedside. 

## 2015-08-02 NOTE — H&P (Addendum)
[facility] History and Physical  Rickey Smith:[id number] DOB: [birth year] DOA: 08/02/2015  Referring physician: EDP PCP: No PCP Per Patient   Chief Complaint: not feeling well  HPI: Rickey Smith is a 50 y.o. male with PMH of DM on Insulin, HTN, H/o diastolic CHF presents to the ER with the above complaints. Pt reports being in his usual state of health till yesterday, this morning when he woke up his CBGs were in 450s range, and just didn't feel well, he was urinating a lot, felt weak and dizzy. He took his Insulin 70/30 and rechecked his blood sugars, it was now 420, he then took some 20units of short acting insulin and still felt weak and dizzy. He called his nephew who took him to the local fire-station where he could be checked by Paramedics, while there he vomited twice, non bloody and non bilious. Apparently he was felt to be in SVT there and was given a dose of IV adenosine. All strips available to [place] are consistent with Sinus tachycardia In ER, BP was in high 90s and sinus tachycardia which improved with NS bolus and IVF, workup unremarkable except for WBC of 17K. He denies any fevers, reports chills, no chest or Abd pain, no diarrhea, no chest pain, cough, congestion or dyspnea at this time  Review of Systems: positives bolded Constitutional:  No weight loss, night sweats, Fevers, chills, fatigue.  HEENT:  No headaches, Difficulty swallowing,Tooth/dental problems,Sore throat,  No sneezing, itching, ear ache, nasal congestion, post nasal drip,  Cardio-vascular:  No chest pain, Orthopnea, PND, swelling in lower extremities, anasarca, dizziness, palpitations  GI:  No heartburn, indigestion, abdominal pain, nausea, vomiting, diarrhea, change in bowel habits, loss of appetite  Resp:  No shortness of breath with exertion or at rest. No excess mucus, no productive cough, No non-productive cough, No coughing up of blood.No change in color of mucus.No wheezing.No chest wall  deformity  Skin:  no rash or lesions.  GU:  no dysuria, change in color of urine, no urgency or frequency. No flank pain.  Musculoskeletal:  No joint pain or swelling. No decreased range of motion. No back pain.  Psych:  No change in mood or affect. No depression or anxiety. No memory loss.   Past Medical History  Diagnosis Date  . Diabetes mellitus without complication (HCC)   . CHF (congestive heart failure) (HCC)    History reviewed. No pertinent past surgical history. Social History:  reports that he has quit smoking. His smoking use included Cigarettes. He has a 20 pack-year smoking history. He has never used smokeless tobacco. He reports that he does not drink alcohol or use illicit drugs.  Allergies  Allergen Reactions  . Codeine Itching  . Gabapentin Diarrhea and Nausea And Vomiting    Family History  Problem Relation Age of Onset  . Heart attack    . Prostate cancer    . Hypertension    . Hypertension Mother   . Cancer Father     Prior to Admission medications   Medication Sig Start Date End Date Taking? Authorizing Provider  amitriptyline (ELAVIL) 75 MG tablet Take 75 mg by mouth at bedtime.   Yes Historical Provider, MD  ibuprofen (ADVIL,MOTRIN) 800 MG tablet Take 1 tablet (800 mg total) by mouth every 8 (eight) hours as needed for mild pain. 04/16/15  Yes Kristen N Ward, DO  insulin aspart protamine- aspart (NOVOLOG MIX 70/30) (70-30) 100 UNIT/ML injection Inject 0.22 mLs (22 Units total) into  the skin 2 (two) times daily with a meal. Patient taking differently: Inject 45 Units into the skin 2 (two) times daily with a meal.  10/26/14  Yes Henderson Cloud, MD  lisinopril (PRINIVIL,ZESTRIL) 5 MG tablet Take 5 mg by mouth daily.   Yes Historical Provider, MD  metFORMIN (GLUCOPHAGE) 500 MG tablet Take 500 mg by mouth daily with breakfast.   Yes Historical Provider, MD  metoprolol tartrate (LOPRESSOR) 25 MG tablet Take 0.5 tablets (12.5 mg total) by mouth 2  (two) times daily. 10/26/14  Yes Henderson Cloud, MD  ondansetron (ZOFRAN) 4 MG tablet Take 1 tablet (4 mg total) by mouth every 6 (six) hours. 10/26/14  Yes Henderson Cloud, MD  alum & mag hydroxide-simeth (MAALOX/MYLANTA) 200-200-20 MG/5ML suspension Take 30 mLs by mouth every 6 (six) hours as needed for indigestion or heartburn. Patient not taking: Reported on 05/08/2015 10/26/14   Henderson Cloud, MD  atorvastatin (LIPITOR) 40 MG tablet Take 1 tablet (40 mg total) by mouth daily at 6 PM. Patient not taking: Reported on 05/08/2015 10/26/14   Henderson Cloud, MD  HYDROcodone-acetaminophen (NORCO/VICODIN) 5-325 MG tablet Take 1-2 tablets by mouth every 6 (six) hours as needed. Patient not taking: Reported on 08/02/2015 05/23/15   Teressa Lower, NP  pantoprazole (PROTONIX) 40 MG tablet Take 1 tablet (40 mg total) by mouth daily. Patient not taking: Reported on 05/08/2015 10/26/14   Henderson Cloud, MD  promethazine (PHENERGAN) 25 MG tablet Take 1 tablet (25 mg total) by mouth every 6 (six) hours as needed for nausea or vomiting. Patient not taking: Reported on 08/02/2015 04/16/15   Layla Maw Ward, DO   Physical Exam: Filed Vitals:   08/02/15 1530 08/02/15 1600 08/02/15 1709 08/02/15 1715  BP: 96/61 98/74 103/70 105/72  Pulse: 115 117 117 117  Temp:      TempSrc:      Resp: Weight:      SpO2: 97% 100% 98% 99%    Wt Readings from Last 3 Encounters:  08/02/15 79.379 kg (175 lb)  05/23/15 79.379 kg (175 lb)  05/08/15 72.576 kg (160 lb)    General:  Appears calm and comfortable, AAOx3, no distress Eyes: PERRL, normal lids, irises & conjunctiva ENT: poor dental hygiene Neck: no LAD, masses or thyromegaly Cardiovascular: RRR, no m/r/g. No LE edema. Telemetry: SR, no arrhythmias  Respiratory: CTA bilaterally, no w/r/r. Normal respiratory effort. Abdomen: soft, NT, ND, BS present,  Skin: diffuse tattoos Musculoskeletal: grossly normal  tone BUE/BLE Psychiatric: grossly normal mood and affect, speech fluent and appropriate Neurologic: grossly non-focal.          Labs on Admission:  Basic Metabolic Panel:  Recent Labs Lab 08/02/15 1306  NA 139  K 3.7  CL 100*  CO2 26  GLUCOSE 192*  BUN 11  CREATININE 1.23  CALCIUM 9.3   Liver Function Tests:  Recent Labs Lab 08/02/15 1306  AST 17  ALT 16*  ALKPHOS 80  BILITOT 1.0  PROT 6.7  ALBUMIN 3.5    Recent Labs Lab 08/02/15 1306  LIPASE 16   No results for input(s): AMMONIA in the last 168 hours. CBC:  Recent Labs Lab 08/02/15 1306  WBC 17.0*  NEUTROABS 14.1*  HGB 13.3  HCT 39.4  MCV 86.8  PLT 203   Cardiac Enzymes: No results for input(s): CKTOTAL, CKMB, CKMBINDEX, TROPONINI in the last 168 hours.  BNP (last 3 results)  Recent Labs  08/02/15 1306  BNP 30.1    ProBNP (last 3 results) No results for input(s): PROBNP in the last 8760 hours.  CBG:  Recent Labs Lab 08/02/15 1303  GLUCAP 194*    Radiological Exams on Admission: Ct Angio Chest Pe W/cm &/or Wo Cm  08/02/2015  CLINICAL DATA:  Mid chest pain. EXAM: CT ANGIOGRAPHY CHEST WITH CONTRAST TECHNIQUE: Multidetector CT imaging of the chest was performed using the standard protocol during bolus administration of intravenous contrast. Multiplanar CT image reconstructions and MIPs were obtained to evaluate the vascular anatomy. CONTRAST:  OMNIPAQUE IOHEXOL 350 MG/ML SOLN COMPARISON:  CT of the chest 01/25/2008 FINDINGS: Mediastinum/Lymph Nodes: No pulmonary emboli or thoracic aortic dissection identified. No masses or pathologically enlarged lymph nodes identified. The heart is normal in size. There is no pericardial effusion. Lungs/Pleura: No pulmonary mass, infiltrate, or effusion. There is moderate upper lobe predominant paraseptal emphysema. Bibasilar hypoventilatory changes are seen. Upper abdomen: No acute findings. Musculoskeletal: No chest wall mass or suspicious bone lesions  identified. Review of the MIP images confirms the above findings. IMPRESSION: No evidence of pulmonary embolus or thoracic dissection. Upper lobe predominant emphysematous changes of the lungs, without evidence of acute process. Electronically Signed   By: Ted Mcalpine M.D.   On: 08/02/2015 16:40   Dg Chest Port 1 View  08/02/2015  CLINICAL DATA:  EPIGASTRIC PAIN/MID CHEST,HIGH BLOOD SUGAR,HX DM.HIGH HEART RATE EXAM: PORTABLE CHEST 1 VIEW COMPARISON:  07/31/2014 FINDINGS: Normal mediastinum and cardiac silhouette. Normal pulmonary vasculature. No evidence of effusion, infiltrate, or pneumothorax. No acute bony abnormality. IMPRESSION: No acute cardiopulmonary process. Electronically Signed   By: Genevive Bi M.D.   On: 08/02/2015 13:51    EKG: Independently reviewed. Sinus tachycardia, Ventricular bigeminy  Assessment/Plan Active Problems:   Diabetes mellitus with nonketotic hyperosmolarity (HCC) -CBGs in 450range this am, now improved after 2doses of Insulin -Resume Insulin 70/30, SSI -Hydration -no DKA based on labs -FU Hbaic    SIRS -unclear is this is early sepsis/SIRS or purely dehydration related to Hyperglycemia and hyperosmolarity -Hydrate with NS at 100cc/hr -no localizing infectious symptoms, no Flu like symptoms -FU UA, check Blood culture -check lactate -if BP drops or clinically worsens will start Empiric IV Abx    Leukocytosis -either reactive or SIRS -see above    Tachycardia -no evidence of SVT now, sinus tachycardia due to above -will cycle troponins -resume metoprolol as BP tolerates to prevent withdrawal tachycardia    HTN -hold lisinopril, IVF  DVT proph: lovenox  Code Status: Full Code DVT Prophylaxis:lovenox Family Communication: mother at bedside Disposition Plan: home when improved  Time spent: 0294  [facility] Pager [phone]

## 2015-08-02 NOTE — Progress Notes (Signed)
Assumed care from Junris Parsons, RN. 

## 2015-08-02 NOTE — ED Notes (Signed)
No answer on 2W

## 2015-08-02 NOTE — ED Notes (Signed)
Report accepted by [place], RN

## 2015-08-02 NOTE — Progress Notes (Signed)
Returned call to [place], Charity fundraiser for report. Will return call - with a patient.

## 2015-08-02 NOTE — ED Provider Notes (Signed)
CSN: [id number]     Arrival date & time 08/02/15  1247 History   First MD Initiated Contact with Patient 08/02/15 1249     Chief Complaint  Patient presents with  . Tachycardia     (Consider location/radiation/quality/duration/timing/severity/associated sxs/prior Treatment) The history is provided by the patient and medical records.    50 year old male with history of HTN, HLP, GERD, DM1 on insulin, CHF, migraine headaches, presenting to the ED for palpitations, lightheadedness, nausea, and emesis which began this morning.  Patient states he did have a few episodes of vomiting this morning prior to EMS arrival.  Upon EMS arrival, patient was reported to be in SVT with HR around 157 by first EMS truck.  He was given  adenosine and 500 cc bolus with improvement of HR to 137.  Patient was also given zofran.  CBG was 356 in the field.  He reports his nausea and lightheadedness has vastly improved and time. He denies any current chest pain or shortness of breath. Patient denies any history of SVT in the past.  On arrival to ED, patient with continued tachycardia at rate of 136.  Patient does report he has had increased thirst and urination frequently. He states his blood sugar has been elevated above baseline. He reports he has been taking his insulin as directed. He denies any current abdominal pain or chest pain.   Past Medical History  Diagnosis Date  . Diabetes mellitus without complication (HCC)   . CHF (congestive heart failure) (HCC)    History reviewed. No pertinent past surgical history. Family History  Problem Relation Age of Onset  . Heart attack    . Prostate cancer    . Hypertension    . Hypertension Mother   . Cancer Father    Social History  Substance Use Topics  . Smoking status: Former Smoker -- 1.00 packs/day for 20 years    Types: Cigarettes  . Smokeless tobacco: Never Used  . Alcohol Use: No    Review of Systems  Cardiovascular: Positive for palpitations.   Gastrointestinal: Positive for nausea and vomiting.  Neurological: Positive for light-headedness.  All other systems reviewed and are negative.     Allergies  Codeine and Gabapentin  Home Medications   Prior to Admission medications   Medication Sig Start Date End Date Taking? Authorizing Provider  alum & mag hydroxide-simeth (MAALOX/MYLANTA) 200-200-20 MG/5ML suspension Take 30 mLs by mouth every 6 (six) hours as needed for indigestion or heartburn. Patient not taking: Reported on 05/08/2015 10/26/14   Henderson Cloud, MD  amitriptyline (ELAVIL) 50 MG tablet Take 50 mg by mouth at bedtime.    Historical Provider, MD  atorvastatin (LIPITOR) 40 MG tablet Take 1 tablet (40 mg total) by mouth daily at 6 PM. Patient not taking: Reported on 05/08/2015 10/26/14   Henderson Cloud, MD  HYDROcodone-acetaminophen (NORCO/VICODIN) 5-325 MG tablet Take 1-2 tablets by mouth every 6 (six) hours as needed. 05/23/15   Teressa Lower, NP  ibuprofen (ADVIL,MOTRIN) 800 MG tablet Take 1 tablet (800 mg total) by mouth every 8 (eight) hours as needed for mild pain. 04/16/15   Kristen N Ward, DO  insulin aspart protamine- aspart (NOVOLOG MIX 70/30) (70-30) 100 UNIT/ML injection Inject 0.22 mLs (22 Units total) into the skin 2 (two) times daily with a meal. 10/26/14   Estela Isaiah Blakes, MD  metoprolol tartrate (LOPRESSOR) 25 MG tablet Take 0.5 tablets (12.5 mg total) by mouth 2 (two) times daily. Patient not  taking: Reported on 05/08/2015 10/26/14   Henderson Cloud, MD  ondansetron (ZOFRAN) 4 MG tablet Take 1 tablet (4 mg total) by mouth every 6 (six) hours. Patient not taking: Reported on 05/08/2015 10/26/14   Henderson Cloud, MD  pantoprazole (PROTONIX) 40 MG tablet Take 1 tablet (40 mg total) by mouth daily. Patient not taking: Reported on 05/08/2015 10/26/14   Henderson Cloud, MD  promethazine (PHENERGAN) 25 MG tablet Take 1 tablet (25 mg total) by mouth every  6 (six) hours as needed for nausea or vomiting. 04/16/15   Kristen N Ward, DO   BP 98/68 mmHg  Pulse 132  Temp(Src) 98.8 F (37.1 C) (Oral)  Resp 19  Wt 79.379 kg  SpO2 91%   Physical Exam  Constitutional: He is oriented to person, place, and time. He appears well-developed and well-nourished. No distress.  HENT:  Head: Normocephalic and atraumatic.  Mouth/Throat: Oropharynx is clear and moist.  Dry mucous membranes  Eyes: Conjunctivae and EOM are normal. Pupils are equal, round, and reactive to light.  Neck: Normal range of motion. Neck supple.  Cardiovascular: Regular rhythm and normal heart sounds.  Tachycardia present.   Pulmonary/Chest: Effort normal and breath sounds normal. No respiratory distress. He has no wheezes.  Abdominal: Soft. Bowel sounds are normal. There is no tenderness. There is no guarding.  Musculoskeletal: Normal range of motion.  Neurological: He is alert and oriented to person, place, and time.  Skin: Skin is warm and dry. He is not diaphoretic.  Psychiatric: He has a normal mood and affect.  Nursing note and vitals reviewed.   ED Course  Procedures (including critical care time) Labs Review Labs Reviewed  CBC WITH DIFFERENTIAL/PLATELET - Abnormal; Notable for the following:    WBC 17.0 (*)    Neutro Abs 14.1 (*)    Lymphs Abs 0.3 (*)    Monocytes Absolute 2.6 (*)    All other components within normal limits  COMPREHENSIVE METABOLIC PANEL - Abnormal; Notable for the following:    Chloride 100 (*)    Glucose, Bld 192 (*)    ALT 16 (*)    All other components within normal limits  I-STAT VENOUS BLOOD GAS, ED - Abnormal; Notable for the following:    pH, Ven 7.467 (*)    pCO2, Ven 34.5 (*)    Bicarbonate 24.9 (*)    All other components within normal limits  CBG MONITORING, ED - Abnormal; Notable for the following:    Glucose-Capillary 194 (*)    All other components within normal limits  LIPASE, BLOOD  BRAIN NATRIURETIC PEPTIDE  URINALYSIS,  ROUTINE W REFLEX MICROSCOPIC (NOT AT [facility])  URINE RAPID DRUG SCREEN, HOSP PERFORMED  I-STAT TROPOININ, ED    Imaging Review Ct Angio Chest Pe W/cm &/or Wo Cm  08/02/2015  CLINICAL DATA:  Mid chest pain. EXAM: CT ANGIOGRAPHY CHEST WITH CONTRAST TECHNIQUE: Multidetector CT imaging of the chest was performed using the standard protocol during bolus administration of intravenous contrast. Multiplanar CT image reconstructions and MIPs were obtained to evaluate the vascular anatomy. CONTRAST:  OMNIPAQUE IOHEXOL 350 MG/ML SOLN COMPARISON:  CT of the chest 01/25/2008 FINDINGS: Mediastinum/Lymph Nodes: No pulmonary emboli or thoracic aortic dissection identified. No masses or pathologically enlarged lymph nodes identified. The heart is normal in size. There is no pericardial effusion. Lungs/Pleura: No pulmonary mass, infiltrate, or effusion. There is moderate upper lobe predominant paraseptal emphysema. Bibasilar hypoventilatory changes are seen. Upper abdomen: No acute findings.  Musculoskeletal: No chest wall mass or suspicious bone lesions identified. Review of the MIP images confirms the above findings. IMPRESSION: No evidence of pulmonary embolus or thoracic dissection. Upper lobe predominant emphysematous changes of the lungs, without evidence of acute process. Electronically Signed   By: Ted Mcalpine M.D.   On: 08/02/2015 16:40   Dg Chest Port 1 View  08/02/2015  CLINICAL DATA:  EPIGASTRIC PAIN/MID CHEST,HIGH BLOOD SUGAR,HX DM.HIGH HEART RATE EXAM: PORTABLE CHEST 1 VIEW COMPARISON:  07/31/2014 FINDINGS: Normal mediastinum and cardiac silhouette. Normal pulmonary vasculature. No evidence of effusion, infiltrate, or pneumothorax. No acute bony abnormality. IMPRESSION: No acute cardiopulmonary process. Electronically Signed   By: Genevive Bi M.D.   On: 08/02/2015 13:51   I have personally reviewed and evaluated these images and lab results as part of my medical decision-making.   EKG  Interpretation None      MDM   Final diagnoses:  Tachycardia  Hypotension, unspecified hypotension type   50 y.o. M here with alleged episode of SVT by EMS.  On arrival here, patient is in rhythm of sinus tachycardia at a rate of 136. I've reviewed rhythm strips from EMS that brought patient here, no evidence of SVT rather sinus tach. His episode of SVT was apparently recorded by first EMS truck that responded to scene, those tracings are not available for review.  Patient is afebrile, nontoxic. His mucous membranes are dry. He denies any current lightheadedness, dizziness, chest pain, or shortness of breath. No abdominal pain and exam is benign.  Reports his nausea is substantially better, no futher vomiting after EMS arrival.  Patient has been hospitalized for DKA in the past. Will obtain labs, urine, CBG, EKG, and chest x-ray. IV fluid bolus started.  Workup thus far with noted leukocytosis. Chest x-ray is negative. Patient remains in a sinus tachycardia, rate has improved to around 115 at this time. Patient is still hypotensive to 90/57 after 2 L of fluid.  Will give additional fluids.  Will also obtain CTA-- patient with new onset SVT reported by EMS, hx of pericarditis, elevated WBC count and no obvious source of infection at this time.    CTA negative for PE, pneumonia, or pericardial effusion. Patient still remains tachycardic to 115 and hypotensive at 96/61 after 3 L of IV fluid. Patient states still feels very dehydrated, has only urinated approx 200cc out of the 3L IVF given. Upon chart review his baseline BP is 130's/140's/70's-80's and HR is usually in the 90's.  At this time given his persistent tachycardia and hypotension without current explainable cause aside from dehydration, patient will need admission for continued IVF and further management.  Case discussed with hospitalist, Dr. Jomarie Longs-- will admit to telemetry floor.  Temp admit orders placed.  Garlon Hatchet, PA-C 08/02/15  1956  Samuel Jester, DO 08/06/15 2130

## 2015-08-02 NOTE — ED Notes (Signed)
At 0800 this AM PT started experiencing heart palpitations, nausea, dizziness, central chest pain.

## 2015-08-02 NOTE — ED Notes (Signed)
PT reports nausea and dizziness have improved. Left arm pain 6/10 with radiation to neck and head.

## 2015-08-02 NOTE — ED Notes (Signed)
EDPA made aware of temp and request for pain medicine for migraine.

## 2015-08-02 NOTE — ED Notes (Signed)
PT denies history of SVT.

## 2015-08-03 ENCOUNTER — Encounter (HOSPITAL_COMMUNITY): Payer: Self-pay | Admitting: General Practice

## 2015-08-03 ENCOUNTER — Inpatient Hospital Stay (HOSPITAL_COMMUNITY): Payer: Self-pay

## 2015-08-03 DIAGNOSIS — D72829 Elevated white blood cell count, unspecified: Secondary | ICD-10-CM

## 2015-08-03 LAB — URINE MICROSCOPIC-ADD ON

## 2015-08-03 LAB — GLUCOSE, CAPILLARY
GLUCOSE-CAPILLARY: 289 mg/dL — AB (ref 65–99)
GLUCOSE-CAPILLARY: 134 mg/dL — AB (ref 65–99)
Glucose-Capillary: 277 mg/dL — ABNORMAL HIGH (ref 65–99)
Glucose-Capillary: 191 mg/dL — ABNORMAL HIGH (ref 65–99)

## 2015-08-03 LAB — CBC
HEMATOCRIT: 32.9 % — AB (ref 39.0–52.0)
Hemoglobin: 11 g/dL — ABNORMAL LOW (ref 13.0–17.0)
MCH: 29.2 pg (ref 26.0–34.0)
MCHC: 33.4 g/dL (ref 30.0–36.0)
MCV: 87.3 fL (ref 78.0–100.0)
Platelets: 158 10*3/uL (ref 150–400)
RBC: 3.77 MIL/uL — ABNORMAL LOW (ref 4.22–5.81)
RDW: 13.4 % (ref 11.5–15.5)
WBC: 15.3 10*3/uL — ABNORMAL HIGH (ref 4.0–10.5)

## 2015-08-03 LAB — COMPREHENSIVE METABOLIC PANEL
ALBUMIN: 2.5 g/dL — AB (ref 3.5–5.0)
ALT: 11 U/L — ABNORMAL LOW (ref 17–63)
AST: 8 U/L — AB (ref 15–41)
Alkaline Phosphatase: 67 U/L (ref 38–126)
Anion gap: 10 (ref 5–15)
BUN: 12 mg/dL (ref 6–20)
CHLORIDE: 101 mmol/L (ref 101–111)
CO2: 23 mmol/L (ref 22–32)
Calcium: 7.7 mg/dL — ABNORMAL LOW (ref 8.9–10.3)
Creatinine, Ser: 0.96 mg/dL (ref 0.61–1.24)
GFR calc Af Amer: >60 mL/min (ref >60)
GFR calc non Af Amer: >60 mL/min (ref >60)
GLUCOSE: 299 mg/dL — AB (ref 65–99)
POTASSIUM: 3.7 mmol/L (ref 3.5–5.1)
Sodium: 134 mmol/L — ABNORMAL LOW (ref 135–145)
Total Bilirubin: 0.8 mg/dL (ref 0.3–1.2)
Total Protein: 5.3 g/dL — ABNORMAL LOW (ref 6.5–8.1)

## 2015-08-03 LAB — URINALYSIS, ROUTINE W REFLEX MICROSCOPIC
Bilirubin Urine: NEGATIVE
Ketones, ur: NEGATIVE mg/dL
LEUKOCYTES UA: NEGATIVE
Nitrite: NEGATIVE
PROTEIN: NEGATIVE mg/dL
SPECIFIC GRAVITY, URINE: 1.023 (ref 1.005–1.030)
pH: 6 (ref 5.0–8.0)

## 2015-08-03 LAB — HEMOGLOBIN A1C
Hgb A1c MFr Bld: 10.6 % — ABNORMAL HIGH (ref 4.8–5.6)
Mean Plasma Glucose: 258 mg/dL

## 2015-08-03 MED ORDER — INSULIN ASPART 100 UNIT/ML ~~LOC~~ SOLN
0.0000 [IU] | Freq: Three times a day (TID) | SUBCUTANEOUS | Status: DC
  Administered 2015-08-03: 3 [IU] via SUBCUTANEOUS
  Administered 2015-08-03 (×2): 8 [IU] via SUBCUTANEOUS
  Administered 2015-08-04: 3 [IU] via SUBCUTANEOUS

## 2015-08-03 MED ORDER — METOPROLOL TARTRATE 25 MG PO TABS
25.0000 mg | ORAL_TABLET | Freq: Two times a day (BID) | ORAL | Status: DC
  Administered 2015-08-03 – 2015-08-04 (×3): 25 mg via ORAL
  Filled 2015-08-03 (×3): qty 1

## 2015-08-03 MED ORDER — INSULIN ASPART 100 UNIT/ML ~~LOC~~ SOLN: 0.0000 [IU] | Freq: Every day | SUBCUTANEOUS | Status: DC

## 2015-08-03 MED ORDER — INSULIN ASPART PROT & ASPART (70-30 MIX) 100 UNIT/ML ~~LOC~~ SUSP
45.0000 [IU] | Freq: Two times a day (BID) | SUBCUTANEOUS | Status: DC
  Administered 2015-08-03 – 2015-08-04 (×3): 45 [IU] via SUBCUTANEOUS

## 2015-08-03 MED ORDER — INSULIN ASPART PROT & ASPART (70-30 MIX) 100 UNIT/ML ~~LOC~~ SUSP: 45.0000 [IU] | Freq: Two times a day (BID) | SUBCUTANEOUS | Status: DC

## 2015-08-03 NOTE — Progress Notes (Signed)
Inpatient Diabetes Program Recommendations  AACE/ADA: New Consensus Statement on Inpatient Glycemic Control (2015)  Target Ranges:  Prepandial:   less than 140 mg/dL      Peak postprandial:   less than 180 mg/dL (1-2 hours)      Critically ill patients:  140 - 180 mg/dL   Review of Glycemic Control  Diabetes history: DM2 Outpatient Diabetes medications: metformin 500 mg bid, 70/30 45 units bid Current orders for Inpatient glycemic control: Novolog moderate tidwc and hs, 70/30 45 units bid  Results for NAMARI, BRETON (MRN [id number]) as of 08/03/2015 18:57  Ref. Range 08/02/2015 19:59 08/02/2015 21:48 08/03/2015 06:22 08/03/2015 11:09 08/03/2015 16:34  Glucose-Capillary Latest Ref Range: 65-99 mg/dL 409 (H) 811 (H) 914 (H) 289 (H) 191 (H)  Results for REYAAN, THOMA (MRN [id number]) as of 08/03/2015 18:57  Ref. Range 08/02/2015 19:40  Hemoglobin A1C Latest Ref Range: 4.8-5.6 % 10.6 (H)   Uncontrolled blood sugars. Pt states he checks his blood sugars several times/day and f/u with PCP approx Q 3months in [place]. States blood sugars had been <200 most of the time up until the last few weeks. Pt had tooth pulled and has others that need repair. No problems getting meds. Discussed HgbA1C results and importance of getting it down to 7.0% to prevent complications.  States diet is pretty good and tries to eat healthy.  Recommended pt f/u with PCP and take blood sugar log to appt for any needed adjustments to insulin regimen.  Agree with orders. Titrate if post-prandial blood sugars consistently > 180 mg/dL.  Thank you. Ailene Ards, RD, LDN, CDE Inpatient Diabetes Coordinator [phone]

## 2015-08-03 NOTE — Hospital Discharge Follow-Up (Signed)
Transitional Care Clinic Care Coordination Note:  Admit date:  08/02/15 Discharge date: TBD Discharge Disposition: Home when stable Patient contact: [phone] (mobile) Emergency contact(s): Nguyen Todorov (mother)-[phone]  This Case Manager reviewed patient's EMR and determined patient would benefit from post-discharge medical management and chronic care management services through the [facility]. Patient has a history of diabetes mellitus, HTN, CHF. Patient admitted with diabetes mellitus with nonketotic hyperosmolarity, SIRS, tachycardia. This Case Manager met with patient to discuss the services and medical management that can be provided at the [facility]. Patient verbalized understanding and agreed to receive post-discharge care at the [facility].   Patient scheduled for Transitional Care appointment on 08/10/15 at 1015 with Dr. Jarold Song.  Clinic information and appointment time provided to patient. Appointment information also placed on AVS.  Assessment:       Home Environment: Patient lives alone in a private residence.       Support System: family members       Level of functioning: independent       Home DME: none       Home care services: none      Transportation: Patient indicates he drives to his medical appointments. No transportation barriers identified.        Food/Nutrition: Patient indicates he has access to needed food.        Medications: Patient indicates he gets his insulin from [facility]. He gets all other medications from [facility] in [place], [place]. Patient denies problems obtaining or affording medications. This Case Manager informed patient of pharmacy resources available at [facility] and [facility]. Patient appreciative of information.        Identified Barriers: uninsured-This Case Manager also informed patient that [facility] has Designer, fashion/clothing.   Informed patient that he would benefit from meeting with Financial Counselor to determine if eligible for [employer] or the [employer].  Additional barrier is patient does not have a PCP.        PCP: none-Patient will need to establish with a [facility] provider after 30 days of medical management with the [facility].              Arranged services:        Services communicated to Marvetta Gibbons, RN CM

## 2015-08-03 NOTE — Progress Notes (Signed)
Reports new pain started about 1 hour ago as mild, gradually increasing now "about 5" - indicates lower abdomen/ supra pubic and lower back, both left and right. Asked to describe, "Just a pain... Does hurt some now when I pee the last 2 or three times." "Can't say I have felt anything like it before."

## 2015-08-03 NOTE — Progress Notes (Signed)
Utilization review completed. Coleston Dirosa, RN, BSN. 

## 2015-08-03 NOTE — Progress Notes (Signed)
[facility] PROGRESS NOTE  Rickey Smith WUJ:[id number] DOB: 1965-12-09 DOA: 08/02/2015 PCP: No PCP Per Patient  Assessment/Plan:  Diabetes mellitus with nonketotic hyperosmolarity (HCC) -CBGs in 450range 2/8 am, now improved after 2doses of Insulin -Resume Insulin 70/30, SSI, increase dose -Hydration-cut down to 75cc/hr -no DKA based on labs -Hbaic is 10.6   SIRS -? Viral illness vs purely dehydration related to Hyperglycemia and hyperosmolarity -improving, afebrile, WBC improving -no localizing infectious symptoms, no Flu like symptoms -UA-unremarkable, FU Blood culture -lactate-WNL -no indication for Abx at thsi time   Leukocytosis -either reactive or SIRS -see above, improving   Tachycardia -improved -no evidence of SVT now, sinus tachycardia due to above -troponin's negative -incraese metoprolol to home dose   HTN -held lisinopril, IVF -BP much better  DVT proph: lovenox  Code Status: Full Code Family Communication: none at bedside Disposition Plan: Home tomorrow if stable   Consultants:    HPI/Subjective: Feels much better, no fevers  Objective: Filed Vitals:   08/03/15 0515 08/03/15 0700  BP: 110/65 111/64  Pulse: 107 107  Temp: 98.8 F (37.1 C) 98.6 F (37 C)  Resp: 18 20    Intake/Output Summary (Last 24 hours) at 08/03/15 1246 Last data filed at 08/03/15 0900  Gross per 24 hour  Intake    720 ml  Output    950 ml  Net   -230 ml   Filed Weights   08/02/15 1256 08/02/15 2152  Weight: 79.379 kg (175 lb) 73.755 kg (162 lb 9.6 oz)    Exam:   General:  AAOx3, no distress  Cardiovascular: S1S2/RRR  Respiratory: CTAB  Abdomen: soft, NT, BS present  Musculoskeletal: no edema c/c  Data Reviewed: Basic Metabolic Panel:  Recent Labs Lab 08/02/15 1306 08/03/15 0553  NA 139 134*  K 3.7 3.7  CL 100* 101  CO2 26 23  GLUCOSE 192* 299*  BUN 11 12  CREATININE 1.23 0.96  CALCIUM 9.3 7.7*   Liver Function  Tests:  Recent Labs Lab 08/02/15 1306 08/03/15 0553  AST 17 8*  ALT 16* 11*  ALKPHOS 80 67  BILITOT 1.0 0.8  PROT 6.7 5.3*  ALBUMIN 3.5 2.5*    Recent Labs Lab 08/02/15 1306  LIPASE 16   No results for input(s): AMMONIA in the last 168 hours. CBC:  Recent Labs Lab 08/02/15 1306 08/03/15 0553  WBC 17.0* 15.3*  NEUTROABS 14.1*  --   HGB 13.3 11.0*  HCT 39.4 32.9*  MCV 86.8 87.3  PLT 203 158   Cardiac Enzymes:  Recent Labs Lab 08/02/15 1841  TROPONINI <0.03   BNP (last 3 results)  Recent Labs  08/02/15 1306  BNP 30.1    ProBNP (last 3 results) No results for input(s): PROBNP in the last 8760 hours.  CBG:  Recent Labs Lab 08/02/15 1919 08/02/15 1959 08/02/15 2148 08/03/15 0622 08/03/15 1109  GLUCAP 60* 156* 318* 277* 289*    No results found for this or any previous visit (from the past 240 hour(s)).   Studies: Ct Angio Chest Pe W/cm &/or Wo Cm  08/02/2015  CLINICAL DATA:  Mid chest pain. EXAM: CT ANGIOGRAPHY CHEST WITH CONTRAST TECHNIQUE: Multidetector CT imaging of the chest was performed using the standard protocol during bolus administration of intravenous contrast. Multiplanar CT image reconstructions and MIPs were obtained to evaluate the vascular anatomy. CONTRAST:  OMNIPAQUE IOHEXOL 350 MG/ML SOLN COMPARISON:  CT of the chest 01/25/2008 FINDINGS: Mediastinum/Lymph Nodes: No pulmonary emboli or thoracic aortic dissection  identified. No masses or pathologically enlarged lymph nodes identified. The heart is normal in size. There is no pericardial effusion. Lungs/Pleura: No pulmonary mass, infiltrate, or effusion. There is moderate upper lobe predominant paraseptal emphysema. Bibasilar hypoventilatory changes are seen. Upper abdomen: No acute findings. Musculoskeletal: No chest wall mass or suspicious bone lesions identified. Review of the MIP images confirms the above findings. IMPRESSION: No evidence of pulmonary embolus or thoracic  dissection. Upper lobe predominant emphysematous changes of the lungs, without evidence of acute process. Electronically Signed   By: Ted Mcalpine M.D.   On: 08/02/2015 16:40   Dg Chest Port 1 View  08/02/2015  CLINICAL DATA:  EPIGASTRIC PAIN/MID CHEST,HIGH BLOOD SUGAR,HX DM.HIGH HEART RATE EXAM: PORTABLE CHEST 1 VIEW COMPARISON:  07/31/2014 FINDINGS: Normal mediastinum and cardiac silhouette. Normal pulmonary vasculature. No evidence of effusion, infiltrate, or pneumothorax. No acute bony abnormality. IMPRESSION: No acute cardiopulmonary process. Electronically Signed   By: Genevive Bi M.D.   On: 08/02/2015 13:51    Scheduled Meds: . amitriptyline  75 mg Oral QHS  . enoxaparin (LOVENOX) injection  40 mg Subcutaneous Q24H  . insulin aspart  0-15 Units Subcutaneous TID WC  . insulin aspart  0-5 Units Subcutaneous QHS  . insulin aspart protamine- aspart  45 Units Subcutaneous BID WC  . metoprolol tartrate  25 mg Oral BID   Continuous Infusions: . sodium chloride 100 mL/hr at 08/02/15 1915   Antibiotics Given (last 72 hours)    None      Active Problems:   Diabetes mellitus with nonketotic hyperosmolarity (HCC)   Leukocytosis   Tachycardia   SIRS (systemic inflammatory response syndrome) (HCC)    Time spent:    [facility]  [facility] Pager [phone]. If 7PM-7AM, please contact night-coverage at [email or web address], password [facility] 08/03/2015, 12:46 PM  LOS: 1 day

## 2015-08-03 NOTE — Care Management Note (Signed)
Pt was resistive to teaching on both diabetes and use if street drugs.  Pt denied using them

## 2015-08-04 DIAGNOSIS — I959 Hypotension, unspecified: Secondary | ICD-10-CM | POA: Insufficient documentation

## 2015-08-04 LAB — COMPREHENSIVE METABOLIC PANEL
ALT: 10 U/L — AB (ref 17–63)
AST: 9 U/L — AB (ref 15–41)
Albumin: 2.3 g/dL — ABNORMAL LOW (ref 3.5–5.0)
Alkaline Phosphatase: 63 U/L (ref 38–126)
Anion gap: 7 (ref 5–15)
BUN: 6 mg/dL (ref 6–20)
CHLORIDE: 107 mmol/L (ref 101–111)
CO2: 25 mmol/L (ref 22–32)
Calcium: 8.1 mg/dL — ABNORMAL LOW (ref 8.9–10.3)
Creatinine, Ser: 0.72 mg/dL (ref 0.61–1.24)
GFR calc Af Amer: >60 mL/min (ref >60)
Glucose, Bld: 75 mg/dL (ref 65–99)
POTASSIUM: 3.3 mmol/L — AB (ref 3.5–5.1)
SODIUM: 139 mmol/L (ref 135–145)
Total Bilirubin: 0.4 mg/dL (ref 0.3–1.2)
Total Protein: 5.4 g/dL — ABNORMAL LOW (ref 6.5–8.1)

## 2015-08-04 LAB — CBC
HCT: 33.1 % — ABNORMAL LOW (ref 39.0–52.0)
Hemoglobin: 10.7 g/dL — ABNORMAL LOW (ref 13.0–17.0)
MCH: 28 pg (ref 26.0–34.0)
MCHC: 32.3 g/dL (ref 30.0–36.0)
MCV: 86.6 fL (ref 78.0–100.0)
PLATELETS: 167 10*3/uL (ref 150–400)
RBC: 3.82 MIL/uL — AB (ref 4.22–5.81)
RDW: 13.1 % (ref 11.5–15.5)
WBC: 12.4 10*3/uL — AB (ref 4.0–10.5)

## 2015-08-04 LAB — GLUCOSE, CAPILLARY
GLUCOSE-CAPILLARY: 72 mg/dL (ref 65–99)
GLUCOSE-CAPILLARY: 156 mg/dL — AB (ref 65–99)

## 2015-08-04 MED ORDER — POLYETHYLENE GLYCOL 3350 17 G PO PACK
17.0000 g | PACK | Freq: Every day | ORAL | Status: DC
  Administered 2015-08-04: 17 g via ORAL
  Filled 2015-08-04: qty 1

## 2015-08-04 NOTE — Discharge Summary (Signed)
Physician Discharge Summary  Rickey Smith RUE:[id number] DOB: September 27, 1965 DOA: 08/02/2015  PCP: No PCP Per Patient  Admit date: 08/02/2015 Discharge date: 08/04/2015  Time spent:45 minutes  Recommendations for Outpatient Follow-up:  1. PCP in 1 week   Discharge Diagnoses:  Active Problems:   Diabetes mellitus with nonketotic hyperosmolarity (HCC)   Leukocytosis   Tachycardia   SIRS (systemic inflammatory response syndrome) (HCC)   Arterial hypotension   Discharge Condition:stable  Diet recommendation: Diabetic  Filed Weights   08/02/15 1256 08/02/15 2152  Weight: 79.379 kg (175 lb) 73.755 kg (162 lb 9.6 oz)    History of present illness:  Chief Complaint: not feeling well  HPI: Rickey Smith is a 50 y.o. male with PMH of DM on Insulin, HTN, H/o diastolic CHF presents to the ER with the above complaints. Pt reports being in his usual state of health till 2/7, 2/8 morning when he woke up his CBGs were in 450s range, and just didn't feel well, he was urinating a lot, felt weak and dizzy. He took his Insulin 70/30 and rechecked his blood sugars, it was now 420, he then took some 20units of short acting insulin and still felt weak and dizzy. He called his nephew who took him to the local fire-station where he could be checked by Paramedics, while there he vomited twice, non bloody and non bilious. Apparently he was felt to be in SVT there and was given a dose of IV adenosine. All strips available to [place] are consistent with Sinus tachycardia In ER, BP was in high 90s and sinus tachycardia which improved with NS bolus and IVF, workup unremarkable except for WBC of 17K. He denied any fevers, reports chills, no chest or Abd pain, no diarrhea, no chest pain, cough, congestion or dyspnea  Hospital Course:  Diabetes mellitus with nonketotic hyperosmolarity (HCC) -CBGs in 450range 2/8 am, now improved after 2doses of Insulin -Resumed Insulin 70/30, SSI, increase dose -Hydrated  aggressively with NS -no DKA based on labs -Hbaic is 10.6 -back to home dose of Insulin, advised compliance   SIRS -due to dehydration related to Hyperglycemia and hyperosmolarity -improved, afebrile, WBC improving -no localizing infectious symptoms, no Flu like symptoms -UA-unremarkable, FU Blood culture -lactate-WNL -no indication for Abx   Leukocytosis -either reactive or SIRS -see above, improving, 12.0 today, down from 18   Tachycardia -improved, due to volume depletion and BB withdrawal -no evidence of SVT, sinus tachycardia due to above -troponin's negative -incraesed metoprolol to home dose   HTN -held lisinopril initially, improved, resume ACEIVF  Discharge Exam: Filed Vitals:   08/04/15 0514 08/04/15 0830  BP: 116/71 116/68  Pulse: 99 105  Temp: 98.2 F (36.8 C)   Resp: 18     General: AAOx3 Cardiovascular: S1S2/RRR Respiratory: CTAB  Discharge Instructions   Discharge Instructions    Diet - low sodium heart healthy    Complete by:  As directed      Diet Carb Modified    Complete by:  As directed      Increase activity slowly    Complete by:  As directed           Current Discharge Medication List    CONTINUE these medications which have NOT CHANGED   Details  amitriptyline (ELAVIL) 75 MG tablet Take 75 mg by mouth at bedtime.    insulin aspart protamine- aspart (NOVOLOG MIX 70/30) (70-30) 100 UNIT/ML injection Inject 0.22 mLs (22 Units total) into the skin 2 (two) times  daily with a meal. Qty: 10 mL, Refills: 11    metFORMIN (GLUCOPHAGE) 500 MG tablet Take 500 mg by mouth daily with breakfast.    metoprolol tartrate (LOPRESSOR) 25 MG tablet Take 0.5 tablets (12.5 mg total) by mouth 2 (two) times daily. Qty: 60 tablet, Refills: 1    ondansetron (ZOFRAN) 4 MG tablet Take 1 tablet (4 mg total) by mouth every 6 (six) hours. Qty: 15 tablet, Refills: 0    atorvastatin (LIPITOR) 40 MG tablet Take 1 tablet (40 mg total) by mouth daily at 6  PM. Qty: 30 tablet, Refills: 1    pantoprazole (PROTONIX) 40 MG tablet Take 1 tablet (40 mg total) by mouth daily. Qty: 30 tablet, Refills: 1      STOP taking these medications     ibuprofen (ADVIL,MOTRIN) 800 MG tablet      lisinopril (PRINIVIL,ZESTRIL) 5 MG tablet      alum & mag hydroxide-simeth (MAALOX/MYLANTA) 200-200-20 MG/5ML suspension      HYDROcodone-acetaminophen (NORCO/VICODIN) 5-325 MG tablet      promethazine (PHENERGAN) 25 MG tablet        Allergies  Allergen Reactions  . Codeine Itching  . Gabapentin Diarrhea and Nausea And Vomiting   Follow-up Information    Follow up with [facility] On 08/10/2015.   Why:  Transitional Care Clinic appointment on 08/10/15 at 10:15 am with Dr. Venetia Night.   Contact information:   [address] [place] [address] [phone]       The results of significant diagnostics from this hospitalization (including imaging, microbiology, ancillary and laboratory) are listed below for reference.    Significant Diagnostic Studies: Dg Abd 1 View  08/03/2015  CLINICAL DATA:  Low abdomen pain for 2 hours EXAM: ABDOMEN - 1 VIEW COMPARISON:  None. FINDINGS: There is no bowel obstruction. Extensive bowel content is identified throughout colon. Degenerative joint changes of the spine are noted. IMPRESSION: No bowel obstruction. Constipation. Electronically Signed   By: Sherian Rein M.D.   On: 08/03/2015 19:20   Ct Angio Chest Pe W/cm &/or Wo Cm  08/02/2015  CLINICAL DATA:  Mid chest pain. EXAM: CT ANGIOGRAPHY CHEST WITH CONTRAST TECHNIQUE: Multidetector CT imaging of the chest was performed using the standard protocol during bolus administration of intravenous contrast. Multiplanar CT image reconstructions and MIPs were obtained to evaluate the vascular anatomy. CONTRAST:  OMNIPAQUE IOHEXOL 350 MG/ML SOLN COMPARISON:  CT of the chest 01/25/2008 FINDINGS: Mediastinum/Lymph Nodes: No pulmonary  emboli or thoracic aortic dissection identified. No masses or pathologically enlarged lymph nodes identified. The heart is normal in size. There is no pericardial effusion. Lungs/Pleura: No pulmonary mass, infiltrate, or effusion. There is moderate upper lobe predominant paraseptal emphysema. Bibasilar hypoventilatory changes are seen. Upper abdomen: No acute findings. Musculoskeletal: No chest wall mass or suspicious bone lesions identified. Review of the MIP images confirms the above findings. IMPRESSION: No evidence of pulmonary embolus or thoracic dissection. Upper lobe predominant emphysematous changes of the lungs, without evidence of acute process. Electronically Signed   By: Ted Mcalpine M.D.   On: 08/02/2015 16:40   Dg Chest Port 1 View  08/02/2015  CLINICAL DATA:  EPIGASTRIC PAIN/MID CHEST,HIGH BLOOD SUGAR,HX DM.HIGH HEART RATE EXAM: PORTABLE CHEST 1 VIEW COMPARISON:  07/31/2014 FINDINGS: Normal mediastinum and cardiac silhouette. Normal pulmonary vasculature. No evidence of effusion, infiltrate, or pneumothorax. No acute bony abnormality. IMPRESSION: No acute cardiopulmonary process. Electronically Signed   By: Loura Halt.D.  On: 08/02/2015 13:51    Microbiology: Recent Results (from the past 240 hour(s))  Culture, blood (routine x 2)     Status: None (Preliminary result)   Collection Time: 08/02/15  6:30 PM  Result Value Ref Range Status   Specimen Description BLOOD RIGHT HAND  Final   Special Requests IN PEDIATRIC BOTTLE 3CC  Final   Culture NO GROWTH < 24 HOURS  Final   Report Status PENDING  Incomplete  Culture, blood (routine x 2)     Status: None (Preliminary result)   Collection Time: 08/02/15  6:35 PM  Result Value Ref Range Status   Specimen Description BLOOD LEFT ANTECUBITAL  Final   Special Requests BOTTLES DRAWN AEROBIC AND ANAEROBIC 5CC  Final   Culture NO GROWTH < 24 HOURS  Final   Report Status PENDING  Incomplete     Labs: Basic Metabolic  Panel:  Recent Labs Lab 08/02/15 1306 08/03/15 0553 08/04/15 0431  NA 139 134* 139  K 3.7 3.7 3.3*  CL 100* 101 107  CO2 26 23 25   GLUCOSE 192* 299* 75  BUN 11 12 6   CREATININE 1.23 0.96 0.72  CALCIUM 9.3 7.7* 8.1*   Liver Function Tests:  Recent Labs Lab 08/02/15 1306 08/03/15 0553 08/04/15 0431  AST 17 8* 9*  ALT 16* 11* 10*  ALKPHOS 80 67 63  BILITOT 1.0 0.8 0.4  PROT 6.7 5.3* 5.4*  ALBUMIN 3.5 2.5* 2.3*    Recent Labs Lab 08/02/15 1306  LIPASE 16   No results for input(s): AMMONIA in the last 168 hours. CBC:  Recent Labs Lab 08/02/15 1306 08/03/15 0553 08/04/15 0431  WBC 17.0* 15.3* 12.4*  NEUTROABS 14.1*  --   --   HGB 13.3 11.0* 10.7*  HCT 39.4 32.9* 33.1*  MCV 86.8 87.3 86.6  PLT 203 158 167   Cardiac Enzymes:  Recent Labs Lab 08/02/15 1841  TROPONINI <0.03   BNP: BNP (last 3 results)  Recent Labs  08/02/15 1306  BNP 30.1    ProBNP (last 3 results) No results for input(s): PROBNP in the last 8760 hours.  CBG:  Recent Labs Lab 08/03/15 1109 08/03/15 1634 08/03/15 2042 08/04/15 0629 08/04/15 1115  GLUCAP 289* 191* 134* 72 156*       SignedZannie Cove MD.  [facility] 08/04/2015, 1:35 PM

## 2015-08-04 NOTE — Progress Notes (Signed)
Discharge information and instructions reviewed with patient. Follow-up appointment scheduled. No questions or concerns at this time.

## 2015-08-07 ENCOUNTER — Telehealth: Payer: Self-pay

## 2015-08-07 LAB — CULTURE, BLOOD (ROUTINE X 2)
CULTURE: NO GROWTH
Culture: NO GROWTH

## 2015-08-07 NOTE — Telephone Encounter (Signed)
Transitional Care Clinic Post-discharge Follow-Up Phone Call:  Attempt # 1  Date of Discharge:08/04/2015 Principal Discharge Diagnosis(es): DM with nonketoic hyperosmolality, HTN, diastolic heart failure Post-discharge Communication: Call placed to # [phone] and a HIPAA compliant voice mail message was left requesting a call back to # [phone] or [phone]. A call was also placed to the patient's mother, Carney Bern # [phone] and a HIPAA compliant message was left with her requesting to have him return the call to # [phone] or [phone]. She said that she would give him the message but it might not be until later in the day.  Call Completed: No

## 2015-08-08 ENCOUNTER — Telehealth: Payer: Self-pay

## 2015-08-08 NOTE — Telephone Encounter (Signed)
Transitional Care Clinic Post-discharge Follow-Up Phone Call:  Attempt #2  Date of Discharge: 08/04/2015 Principal Discharge Diagnosis(es): DM with nonketoic hyperosmolality; HTN, CHF Post-discharge Communication: Calls placed to # [phone] and # [phone] and HIPAA compliant voice mail messages were left requesting that the patient return the call to # [phone] or [phone].  Call Completed: No

## 2015-08-09 ENCOUNTER — Telehealth: Payer: Self-pay

## 2015-08-09 NOTE — Telephone Encounter (Signed)
Transitional Care Clinic Post-discharge Follow-Up Phone Call:  Date of Discharge: 08/04/15 Principal Discharge Diagnosis(es): Diabetes mellitus with nonketotic hyperosmolarity, leukocytosis, tachycardia Post-discharge Communication: Received return call from patient, and discharge follow-up phone call completed. Call Completed: Yes                    With Whom: Patient    Please check all that apply:  X  Patient is knowledgeable of his/her condition(s) and/or treatment. X  Patient is caring for self at home.  ? Patient is receiving assist at home from family and/or caregiver. Family and/or caregiver is knowledgeable of patient's condition(s) and/or treatment. ? Patient is receiving home health services. If so, name of agency.     Medication Reconciliation:  X  Medication list reviewed with patient. X  Patient obtained all discharge medications-YES. Patient's medication list reviewed, and patient indicates he has all medications.  Instructed patient to bring all medications to initial Transitional Care Clinic appointment on 08/10/15. Patient verbalized understanding. Also instructed patient to bring blood glucose log to appointment for Dr. Venetia Night to review.   Activities of Daily Living:  X  Independent ? Needs assist  ? Total Care    Community resources in place for patient:  X  None  ? Home Health/Home DME ? Assisted Living ? Support Group           Questions/Concerns discussed: This Case Manager placed call to patient to check status and to remind him of upcoming Transitional Care appointment. Patient indicated he was doing well and did not have any complaints or health concerns. Patient's medication list reviewed thoroughly, and patient indicated he has all medications.  Patient reminded of upcoming [facility] appointment on 08/10/15 at 1015 with Dr. Venetia Night. Patient indicated he would be at his appointment, and he indicated he had transportation to his appointment.

## 2015-08-09 NOTE — Telephone Encounter (Signed)
Transitional Care Clinic Post-discharge Follow-Up Phone Call:  Date of Discharge: 08/04/15 Principal Discharge Diagnosis(es): diabetes mellitus with nonketotic hyperosmolarity, leukocytosis, tachycardia, SIRS Post-discharge Communication: Attempt # 3 to reach patient and complete discharge follow-up phone call. Call placed to #[phone]; unable to reach patient.  Additional call placed to #[phone]; HIPPA compliant message left with patient's mother, Jermany Rimel, who indicated she would inform patient to return call to this Case Manager.  She also indicated patient aware of his initial Transitional Care Clinic appointment on 08/10/15 at 1015 with Dr. Venetia Night. She indicated she would be driving patient to his appointment. Awaiting return call from patient. Call completed: No

## 2015-08-10 ENCOUNTER — Ambulatory Visit: Payer: Self-pay | Attending: Family Medicine | Admitting: Family Medicine

## 2015-08-10 ENCOUNTER — Encounter: Payer: Self-pay | Admitting: Family Medicine

## 2015-08-10 VITALS — BP 112/70 | HR 130 | Temp 98.0°F | Resp 15 | Ht 73.0 in | Wt 157.0 lb

## 2015-08-10 DIAGNOSIS — R000 Tachycardia, unspecified: Secondary | ICD-10-CM | POA: Insufficient documentation

## 2015-08-10 DIAGNOSIS — Z888 Allergy status to other drugs, medicaments and biological substances status: Secondary | ICD-10-CM | POA: Insufficient documentation

## 2015-08-10 DIAGNOSIS — E1140 Type 2 diabetes mellitus with diabetic neuropathy, unspecified: Secondary | ICD-10-CM | POA: Insufficient documentation

## 2015-08-10 DIAGNOSIS — N390 Urinary tract infection, site not specified: Secondary | ICD-10-CM | POA: Insufficient documentation

## 2015-08-10 DIAGNOSIS — Z7984 Long term (current) use of oral hypoglycemic drugs: Secondary | ICD-10-CM | POA: Insufficient documentation

## 2015-08-10 DIAGNOSIS — E1165 Type 2 diabetes mellitus with hyperglycemia: Secondary | ICD-10-CM | POA: Insufficient documentation

## 2015-08-10 DIAGNOSIS — I10 Essential (primary) hypertension: Secondary | ICD-10-CM | POA: Insufficient documentation

## 2015-08-10 DIAGNOSIS — E785 Hyperlipidemia, unspecified: Secondary | ICD-10-CM | POA: Insufficient documentation

## 2015-08-10 DIAGNOSIS — Z79899 Other long term (current) drug therapy: Secondary | ICD-10-CM | POA: Insufficient documentation

## 2015-08-10 DIAGNOSIS — N3000 Acute cystitis without hematuria: Secondary | ICD-10-CM | POA: Insufficient documentation

## 2015-08-10 DIAGNOSIS — R300 Dysuria: Secondary | ICD-10-CM | POA: Insufficient documentation

## 2015-08-10 DIAGNOSIS — Z794 Long term (current) use of insulin: Secondary | ICD-10-CM | POA: Insufficient documentation

## 2015-08-10 DIAGNOSIS — R358 Other polyuria: Secondary | ICD-10-CM | POA: Insufficient documentation

## 2015-08-10 DIAGNOSIS — E1149 Type 2 diabetes mellitus with other diabetic neurological complication: Secondary | ICD-10-CM

## 2015-08-10 DIAGNOSIS — R42 Dizziness and giddiness: Secondary | ICD-10-CM | POA: Insufficient documentation

## 2015-08-10 LAB — POCT URINALYSIS DIPSTICK
Bilirubin, UA: NEGATIVE
Glucose, UA: 500
Ketones, UA: NEGATIVE
NITRITE UA: POSITIVE
PH UA: 6
PROTEIN UA: 30
SPEC GRAV UA: 1.02
UROBILINOGEN UA: 2

## 2015-08-10 LAB — TSH: TSH: 2.25 mIU/L (ref 0.40–4.50)

## 2015-08-10 LAB — GLUCOSE, POCT (MANUAL RESULT ENTRY): POC Glucose: 99 mg/dl (ref 70–99)

## 2015-08-10 MED ORDER — LISINOPRIL 2.5 MG PO TABS: 5.0000 mg | ORAL_TABLET | Freq: Every day | ORAL | Status: DC

## 2015-08-10 MED ORDER — AMITRIPTYLINE HCL 75 MG PO TABS: 75.0000 mg | ORAL_TABLET | Freq: Every day | ORAL | Status: DC

## 2015-08-10 MED ORDER — METOPROLOL TARTRATE 25 MG PO TABS: 25.0000 mg | ORAL_TABLET | Freq: Two times a day (BID) | ORAL | Status: DC

## 2015-08-10 MED ORDER — CIPROFLOXACIN HCL 500 MG PO TABS: 500.0000 mg | ORAL_TABLET | Freq: Two times a day (BID) | ORAL | Status: DC

## 2015-08-10 NOTE — Patient Instructions (Signed)

## 2015-08-10 NOTE — Progress Notes (Signed)
TRANSITIONAL CARE CLINIC  Date of telephone encounter: 08/07/15  Admit date: 08/02/15 Discharge date: 08/04/15  PCP: None Subjective:  Patient ID: Rickey Smith, male    DOB: December 08, 1965  Age: 50 y.o. MRN: [id number]  CC: Hospitalization Follow-up   HPI Rickey Smith he is a 50 year old male with a history of type 2 diabetes mellitus, diabetic neuropathy, hypertension, hyperlipidemia who was previously under the care of the health department and presents today to the transitional care clinic after hospitalization for diabetes mellitus with nonketotic hyperosmolarity at [facility].  He had presented to the emergency room with complaints of polyuria, weakness and dizziness and CBG was in the 450s at home and had initially gone to the fire department where he was thought to have SVT and received a dose of IV adenosine (in the ED he was felt to have sinus tachycardia). He did have leukocytosis of 17,000 and was placed on IV fluids and insulin. Lactate was normal, blood culture no growth to date and leukocytosis was thought to be either reactive or secondary to SIRS. His condition improved and WBC trended down to 12.4 on discharge  Interval history: He reports his blood sugar better with the fasting blood sugar 124 this morning however he complains of dysuria, polyuria and left flank pain but denies fever, vomiting.  Outpatient Prescriptions Prior to Visit  Medication Sig Dispense Refill  . atorvastatin (LIPITOR) 40 MG tablet Take 1 tablet (40 mg total) by mouth daily at 6 PM. 30 tablet 1  . insulin aspart protamine- aspart (NOVOLOG MIX 70/30) (70-30) 100 UNIT/ML injection Inject 0.22 mLs (22 Units total) into the skin 2 (two) times daily with a meal. (Patient taking differently: Inject 45 Units into the skin 2 (two) times daily with a meal. ) 10 mL 11  . metFORMIN (GLUCOPHAGE) 500 MG tablet Take 500 mg by mouth daily with breakfast.    . ondansetron (ZOFRAN) 4 MG tablet Take 1  tablet (4 mg total) by mouth every 6 (six) hours. 15 tablet 0  . amitriptyline (ELAVIL) 75 MG tablet Take 75 mg by mouth at bedtime.    . metoprolol tartrate (LOPRESSOR) 25 MG tablet Take 0.5 tablets (12.5 mg total) by mouth 2 (two) times daily. 60 tablet 1  . pantoprazole (PROTONIX) 40 MG tablet Take 1 tablet (40 mg total) by mouth daily. 30 tablet 1   No facility-administered medications prior to visit.    ROS Review of Systems  Constitutional: Negative for activity change and appetite change.  HENT: Negative for sinus pressure and sore throat.   Eyes: Negative for visual disturbance.  Respiratory: Negative for cough, chest tightness and shortness of breath.   Cardiovascular: Negative for chest pain and leg swelling.  Gastrointestinal: Negative for abdominal pain, diarrhea, constipation and abdominal distention.  Endocrine: Negative.   Genitourinary: Positive for dysuria and flank pain.  Musculoskeletal: Negative for myalgias and joint swelling.  Skin: Negative for rash.  Allergic/Immunologic: Negative.   Neurological: Negative for weakness, light-headedness and numbness.  Psychiatric/Behavioral: Negative for suicidal ideas and dysphoric mood.    Objective:  BP 112/70 mmHg  Pulse 130  Temp(Src) 98 F (36.7 C)  Resp 15  Ht  (1.854 m)  Wt 157 lb (71.215 kg)  BMI 20.72 kg/m2  SpO2 97%  BP/Weight 08/10/2015 08/04/2015 08/02/2015  Systolic BP 112 116 -  Diastolic BP 70 68 -  Wt. (Lbs) 157 - 162.6  BMI 20.72 - 21.46      Physical Exam  Constitutional: He is oriented to person, place, and time. He appears well-developed and well-nourished.  Cardiovascular: Normal heart sounds and intact distal pulses.  Tachycardia present.   No murmur heard. Pulmonary/Chest: Effort normal and breath sounds normal. He has no wheezes. He has no rales. He exhibits no tenderness.  Abdominal: Soft. Bowel sounds are normal. He exhibits no distension and no mass. There is no tenderness.    Musculoskeletal: Normal range of motion.  Neurological: He is alert and oriented to person, place, and time.   CBC Latest Ref Rng 08/04/2015 08/03/2015 08/02/2015  WBC 4.0 - 10.5 K/uL 12.4(H) 15.3(H) 17.0(H)  Hemoglobin 13.0 - 17.0 g/dL 10.7(L) 11.0(L) 13.3  Hematocrit 39.0 - 52.0 % 33.1(L) 32.9(L) 39.4  Platelets 150 - 400 K/uL 167 158 203      Assessment & Plan:   1. Type 2 diabetes mellitus with hyperglycemia, with long-term current use of insulin (HCC) Uncontrolled with A1c of 10.6 CBG of 99 reveals some improvement as I will make no regimen changes today He informs me he was diagnosed 10 years ago with type 1 diabetes he thinks that his chart also reflects type II; labs will be sent off to distinguish between the two - Glucose (CBG) - Glutamic acid decarboxylase auto abs - C-peptide - Insulin Free and Total  2. Dysuria UA positive for UTI - Urinalysis Dipstick - ciprofloxacin (CIPRO) 500 MG tablet; Take 1 tablet (500 mg total) by mouth 2 (two) times daily.  Dispense: 6 tablet; Refill: 0  3. Tachycardia Unknown etiology. Increased dose of metoprolol. We'll need to rule out thyroid etiology - metoprolol tartrate (LOPRESSOR) 25 MG tablet; Take 1 tablet (25 mg total) by mouth 2 (two) times daily.  Dispense: 60 tablet; Refill: 2 - TSH  4. Acute cystitis without hematuria Treated  5. Other diabetic neurological complication associated with type 2 diabetes mellitus (HCC) Allergic to gabapentin. Currently on amitriptyline   Meds ordered this encounter  Medications  . amitriptyline (ELAVIL) 75 MG tablet    Sig: Take 1 tablet (75 mg total) by mouth at bedtime.    Dispense:  30 tablet    Refill:  2  . metoprolol tartrate (LOPRESSOR) 25 MG tablet    Sig: Take 1 tablet (25 mg total) by mouth 2 (two) times daily.    Dispense:  60 tablet    Refill:  2  . lisinopril (PRINIVIL,ZESTRIL) 2.5 MG tablet    Sig: Take 2 tablets (5 mg total) by mouth daily.    Dispense:  2.5 tablet     Refill:  2    Discontinue previous dose  . ciprofloxacin (CIPRO) 500 MG tablet    Sig: Take 1 tablet (500 mg total) by mouth 2 (two) times daily.    Dispense:  6 tablet    Refill:  0    Follow-up: Return in about 2 weeks (around 08/24/2015), or if symptoms worsen or fail to improve, for Follow-up on diabetes mellitus.   Jaclyn Shaggy MD

## 2015-08-10 NOTE — Progress Notes (Signed)
Follow up from hospital Complains of polyuria and dysuria States he is taking all medications He ate breakfast 1 hr ago

## 2015-08-11 ENCOUNTER — Emergency Department (HOSPITAL_COMMUNITY): Payer: Self-pay

## 2015-08-11 ENCOUNTER — Encounter (HOSPITAL_COMMUNITY): Payer: Self-pay | Admitting: Family Medicine

## 2015-08-11 ENCOUNTER — Inpatient Hospital Stay (HOSPITAL_COMMUNITY)
Admission: EM | Admit: 2015-08-11 | Discharge: 2015-08-17 | DRG: 854 | Disposition: A | Discharge status: Home Health | Payer: Self-pay | Attending: Internal Medicine | Admitting: Internal Medicine

## 2015-08-11 DIAGNOSIS — F1410 Cocaine abuse, uncomplicated: Secondary | ICD-10-CM | POA: Diagnosis present

## 2015-08-11 DIAGNOSIS — K219 Gastro-esophageal reflux disease without esophagitis: Secondary | ICD-10-CM | POA: Diagnosis present

## 2015-08-11 DIAGNOSIS — R319 Hematuria, unspecified: Secondary | ICD-10-CM | POA: Diagnosis present

## 2015-08-11 DIAGNOSIS — Z87891 Personal history of nicotine dependence: Secondary | ICD-10-CM

## 2015-08-11 DIAGNOSIS — K029 Dental caries, unspecified: Secondary | ICD-10-CM | POA: Diagnosis present

## 2015-08-11 DIAGNOSIS — A419 Sepsis, unspecified organism: Principal | ICD-10-CM | POA: Diagnosis present

## 2015-08-11 DIAGNOSIS — N390 Urinary tract infection, site not specified: Secondary | ICD-10-CM | POA: Diagnosis present

## 2015-08-11 DIAGNOSIS — R000 Tachycardia, unspecified: Secondary | ICD-10-CM | POA: Diagnosis present

## 2015-08-11 DIAGNOSIS — E870 Hyperosmolality and hypernatremia: Secondary | ICD-10-CM | POA: Diagnosis present

## 2015-08-11 DIAGNOSIS — B9562 Methicillin resistant Staphylococcus aureus infection as the cause of diseases classified elsewhere: Secondary | ICD-10-CM | POA: Diagnosis present

## 2015-08-11 DIAGNOSIS — Z8042 Family history of malignant neoplasm of prostate: Secondary | ICD-10-CM

## 2015-08-11 DIAGNOSIS — K047 Periapical abscess without sinus: Secondary | ICD-10-CM | POA: Diagnosis present

## 2015-08-11 DIAGNOSIS — E1140 Type 2 diabetes mellitus with diabetic neuropathy, unspecified: Secondary | ICD-10-CM | POA: Diagnosis present

## 2015-08-11 DIAGNOSIS — L0291 Cutaneous abscess, unspecified: Secondary | ICD-10-CM | POA: Insufficient documentation

## 2015-08-11 DIAGNOSIS — I10 Essential (primary) hypertension: Secondary | ICD-10-CM | POA: Diagnosis present

## 2015-08-11 DIAGNOSIS — E1165 Type 2 diabetes mellitus with hyperglycemia: Secondary | ICD-10-CM | POA: Diagnosis present

## 2015-08-11 DIAGNOSIS — D72829 Elevated white blood cell count, unspecified: Secondary | ICD-10-CM

## 2015-08-11 DIAGNOSIS — R739 Hyperglycemia, unspecified: Secondary | ICD-10-CM

## 2015-08-11 DIAGNOSIS — N412 Abscess of prostate: Secondary | ICD-10-CM

## 2015-08-11 DIAGNOSIS — Z8249 Family history of ischemic heart disease and other diseases of the circulatory system: Secondary | ICD-10-CM

## 2015-08-11 DIAGNOSIS — Z794 Long term (current) use of insulin: Secondary | ICD-10-CM

## 2015-08-11 DIAGNOSIS — E860 Dehydration: Secondary | ICD-10-CM | POA: Diagnosis present

## 2015-08-11 LAB — CBC WITH DIFFERENTIAL/PLATELET
BASOS PCT: 0 %
Basophils Absolute: 0 10*3/uL (ref 0.0–0.1)
EOS PCT: 1 %
Eosinophils Absolute: 0.3 10*3/uL (ref 0.0–0.7)
HEMATOCRIT: 36.6 % — AB (ref 39.0–52.0)
HEMOGLOBIN: 12.2 g/dL — AB (ref 13.0–17.0)
LYMPHS PCT: 7 %
Lymphs Abs: 1.8 10*3/uL (ref 0.7–4.0)
MCH: 29.1 pg (ref 26.0–34.0)
MCHC: 33.3 g/dL (ref 30.0–36.0)
MCV: 87.4 fL (ref 78.0–100.0)
MONOS PCT: 3 %
Monocytes Absolute: 0.8 10*3/uL (ref 0.1–1.0)
NEUTROS PCT: 89 %
Neutro Abs: 22.3 10*3/uL — ABNORMAL HIGH (ref 1.7–7.7)
Platelets: 386 10*3/uL (ref 150–400)
RBC: 4.19 MIL/uL — ABNORMAL LOW (ref 4.22–5.81)
RDW: 13 % (ref 11.5–15.5)
WBC MORPHOLOGY: INCREASED
WBC: 25.2 10*3/uL — ABNORMAL HIGH (ref 4.0–10.5)

## 2015-08-11 LAB — URINALYSIS, ROUTINE W REFLEX MICROSCOPIC
GLUCOSE, UA: 500 mg/dL — AB
KETONES UR: 15 mg/dL — AB
Nitrite: POSITIVE — AB
PH: 6.5 (ref 5.0–8.0)
SPECIFIC GRAVITY, URINE: 1.025 (ref 1.005–1.030)

## 2015-08-11 LAB — COMPREHENSIVE METABOLIC PANEL
ALBUMIN: 3 g/dL — AB (ref 3.5–5.0)
ALT: 9 U/L — ABNORMAL LOW (ref 17–63)
ANION GAP: 12 (ref 5–15)
AST: 15 U/L (ref 15–41)
Alkaline Phosphatase: 105 U/L (ref 38–126)
BUN: 9 mg/dL (ref 6–20)
CHLORIDE: 101 mmol/L (ref 101–111)
CO2: 24 mmol/L (ref 22–32)
Calcium: 9.1 mg/dL (ref 8.9–10.3)
Creatinine, Ser: 0.94 mg/dL (ref 0.61–1.24)
GFR calc non Af Amer: >60 mL/min (ref >60)
GLUCOSE: 193 mg/dL — AB (ref 65–99)
Potassium: 4.4 mmol/L (ref 3.5–5.1)
SODIUM: 137 mmol/L (ref 135–145)
Total Bilirubin: 0.3 mg/dL (ref 0.3–1.2)
Total Protein: 7 g/dL (ref 6.5–8.1)

## 2015-08-11 LAB — GRAM STAIN: Special Requests: NORMAL

## 2015-08-11 LAB — I-STAT CG4 LACTIC ACID, ED
LACTIC ACID, VENOUS: 0.83 mmol/L (ref 0.5–2.0)
LACTIC ACID, VENOUS: 0.83 mmol/L (ref 0.5–2.0)

## 2015-08-11 LAB — URINE MICROSCOPIC-ADD ON

## 2015-08-11 LAB — C-PEPTIDE: C PEPTIDE: 0.33 ng/mL — AB (ref 0.80–3.85)

## 2015-08-11 LAB — GLUTAMIC ACID DECARBOXYLASE AUTO ABS: Glutamic Acid Decarb Ab: 14 IU/mL — ABNORMAL HIGH (ref <5)

## 2015-08-11 LAB — CBG MONITORING, ED: Glucose-Capillary: 92 mg/dL (ref 65–99)

## 2015-08-11 LAB — GLUCOSE, CAPILLARY: GLUCOSE-CAPILLARY: 212 mg/dL — AB (ref 65–99)

## 2015-08-11 MED ORDER — INSULIN ASPART 100 UNIT/ML ~~LOC~~ SOLN
0.0000 [IU] | Freq: Three times a day (TID) | SUBCUTANEOUS | Status: DC
  Administered 2015-08-12 (×2): 8 [IU] via SUBCUTANEOUS
  Administered 2015-08-12: 3 [IU] via SUBCUTANEOUS
  Administered 2015-08-13: 8 [IU] via SUBCUTANEOUS
  Administered 2015-08-13: 15 [IU] via SUBCUTANEOUS
  Administered 2015-08-13 – 2015-08-14 (×2): 5 [IU] via SUBCUTANEOUS
  Administered 2015-08-14: 3 [IU] via SUBCUTANEOUS
  Administered 2015-08-14: 8 [IU] via SUBCUTANEOUS
  Administered 2015-08-15: 5 [IU] via SUBCUTANEOUS
  Administered 2015-08-15: 8 [IU] via SUBCUTANEOUS
  Administered 2015-08-16: 15 [IU] via SUBCUTANEOUS
  Administered 2015-08-16: 5 [IU] via SUBCUTANEOUS
  Administered 2015-08-17: 3 [IU] via SUBCUTANEOUS
  Administered 2015-08-17: 11 [IU] via SUBCUTANEOUS

## 2015-08-11 MED ORDER — SODIUM CHLORIDE 0.9 % IV BOLUS (SEPSIS)
1000.0000 mL | Freq: Once | INTRAVENOUS | Status: AC
  Administered 2015-08-11: 1000 mL via INTRAVENOUS

## 2015-08-11 MED ORDER — HYDROCODONE-ACETAMINOPHEN 5-325 MG PO TABS
1.0000 | ORAL_TABLET | ORAL | Status: DC | PRN
  Administered 2015-08-16 (×2): 1 via ORAL
  Administered 2015-08-17: 2 via ORAL
  Filled 2015-08-11: qty 2
  Filled 2015-08-11 (×2): qty 1

## 2015-08-11 MED ORDER — PIPERACILLIN-TAZOBACTAM 3.375 G IVPB
3.3750 g | Freq: Three times a day (TID) | INTRAVENOUS | Status: DC
  Administered 2015-08-11 – 2015-08-14 (×8): 3.375 g via INTRAVENOUS
  Filled 2015-08-11 (×10): qty 50

## 2015-08-11 MED ORDER — INSULIN ASPART 100 UNIT/ML ~~LOC~~ SOLN
0.0000 [IU] | Freq: Every day | SUBCUTANEOUS | Status: DC
  Administered 2015-08-11: 2 [IU] via SUBCUTANEOUS
  Administered 2015-08-12: 3 [IU] via SUBCUTANEOUS
  Administered 2015-08-13: 4 [IU] via SUBCUTANEOUS
  Administered 2015-08-14: 2 [IU] via SUBCUTANEOUS

## 2015-08-11 MED ORDER — DEXTROSE 5 % IV SOLN
2.0000 g | Freq: Once | INTRAVENOUS | Status: AC
  Administered 2015-08-11: 2 g via INTRAVENOUS
  Filled 2015-08-11: qty 2

## 2015-08-11 MED ORDER — ONDANSETRON HCL 4 MG/2ML IJ SOLN
4.0000 mg | Freq: Four times a day (QID) | INTRAMUSCULAR | Status: DC | PRN
  Administered 2015-08-11: 4 mg via INTRAVENOUS
  Filled 2015-08-11: qty 2

## 2015-08-11 MED ORDER — PIPERACILLIN-TAZOBACTAM 3.375 G IVPB 30 MIN
3.3750 g | Freq: Once | INTRAVENOUS | Status: AC
  Administered 2015-08-11: 3.375 g via INTRAVENOUS
  Filled 2015-08-11: qty 50

## 2015-08-11 MED ORDER — ONDANSETRON HCL 4 MG/2ML IJ SOLN
4.0000 mg | Freq: Once | INTRAMUSCULAR | Status: AC
  Administered 2015-08-11: 4 mg via INTRAVENOUS
  Filled 2015-08-11: qty 2

## 2015-08-11 MED ORDER — DIPHENHYDRAMINE HCL 50 MG/ML IJ SOLN: 12.5000 mg | Freq: Four times a day (QID) | INTRAMUSCULAR | Status: DC | PRN

## 2015-08-11 MED ORDER — ONDANSETRON HCL 4 MG PO TABS
4.0000 mg | ORAL_TABLET | Freq: Four times a day (QID) | ORAL | Status: DC | PRN
Start: 2015-08-11 — End: 2015-08-17

## 2015-08-11 MED ORDER — BISACODYL 10 MG RE SUPP
10.0000 mg | Freq: Every day | RECTAL | Status: DC | PRN
  Administered 2015-08-12 – 2015-08-13 (×2): 10 mg via RECTAL
  Filled 2015-08-11 (×2): qty 1

## 2015-08-11 MED ORDER — ATORVASTATIN CALCIUM 40 MG PO TABS
40.0000 mg | ORAL_TABLET | Freq: Every day | ORAL | Status: DC
  Administered 2015-08-12 – 2015-08-16 (×5): 40 mg via ORAL
  Filled 2015-08-11 (×6): qty 1

## 2015-08-11 MED ORDER — AMITRIPTYLINE HCL 75 MG PO TABS
75.0000 mg | ORAL_TABLET | Freq: Every day | ORAL | Status: DC
  Administered 2015-08-11 – 2015-08-16 (×6): 75 mg via ORAL
  Filled 2015-08-11 (×4): qty 2
  Filled 2015-08-11 (×2): qty 1

## 2015-08-11 MED ORDER — METOPROLOL TARTRATE 25 MG PO TABS
25.0000 mg | ORAL_TABLET | Freq: Two times a day (BID) | ORAL | Status: DC
  Administered 2015-08-11 – 2015-08-17 (×12): 25 mg via ORAL
  Filled 2015-08-11 (×12): qty 1

## 2015-08-11 MED ORDER — TRAZODONE HCL 50 MG PO TABS: 25.0000 mg | ORAL_TABLET | Freq: Every evening | ORAL | Status: DC | PRN

## 2015-08-11 MED ORDER — ACETAMINOPHEN 650 MG RE SUPP: 650.0000 mg | Freq: Four times a day (QID) | RECTAL | Status: DC | PRN

## 2015-08-11 MED ORDER — SODIUM CHLORIDE 0.9% FLUSH
3.0000 mL | Freq: Two times a day (BID) | INTRAVENOUS | Status: DC
  Administered 2015-08-11 – 2015-08-15 (×5): 3 mL via INTRAVENOUS

## 2015-08-11 MED ORDER — ACETAMINOPHEN 325 MG PO TABS
650.0000 mg | ORAL_TABLET | Freq: Four times a day (QID) | ORAL | Status: DC | PRN
  Administered 2015-08-17: 650 mg via ORAL
  Filled 2015-08-11: qty 2

## 2015-08-11 MED ORDER — ENOXAPARIN SODIUM 40 MG/0.4ML ~~LOC~~ SOLN
40.0000 mg | SUBCUTANEOUS | Status: DC
  Administered 2015-08-11 – 2015-08-16 (×6): 40 mg via SUBCUTANEOUS
  Filled 2015-08-11 (×7): qty 0.4

## 2015-08-11 MED ORDER — HYDROMORPHONE HCL 1 MG/ML IJ SOLN
1.0000 mg | Freq: Once | INTRAMUSCULAR | Status: AC
  Administered 2015-08-11: 1 mg via INTRAVENOUS
  Filled 2015-08-11: qty 1

## 2015-08-11 MED ORDER — HYDROMORPHONE HCL 1 MG/ML IJ SOLN
0.5000 mg | INTRAMUSCULAR | Status: DC | PRN
  Administered 2015-08-11 – 2015-08-14 (×11): 0.5 mg via INTRAVENOUS
  Filled 2015-08-11 (×11): qty 1

## 2015-08-11 MED ORDER — SODIUM CHLORIDE 0.9 % IV SOLN
INTRAVENOUS | Status: DC
  Administered 2015-08-11 – 2015-08-12 (×2): via INTRAVENOUS

## 2015-08-11 NOTE — Progress Notes (Signed)
Rickey Smith is a 50 y.o. male patient admitted from ED awake, alert - oriented  X 4 - no acute distress noted.  VSS - Blood pressure 126/79, pulse 103, temperature 98.6 F (37 C), temperature source Oral, resp. rate 20, SpO2 96 %.    IV in place, occlusive dsg intact without redness.  Orientation to room, and floor completed with information packet given to patient.  Patient declined safety video at this time.  Admission INP armband ID verified with patient, and in place.   SR up x 2, fall assessment complete, with patient and family able to verbalize understanding of risk associated with falls, and verbalized understanding to call nsg before up out of bed.  Call light within reach, patient able to voice, and demonstrate understanding.  Skin, clean-dry- intact without evidence of bruising, or skin tears.   No evidence of skin break down noted on exam.     Will continue to evaluate and treat per MD orders.  Otis Dials, RN 08/11/2015 7:28 PM

## 2015-08-11 NOTE — ED Notes (Signed)
Family at bedside. 

## 2015-08-11 NOTE — ED Notes (Signed)
Pt here with back pain, hematuria and urinary retention. St swas dx with UTI and given abx but hasn't started them

## 2015-08-11 NOTE — ED Notes (Signed)
Patient is resting comfortably. 

## 2015-08-11 NOTE — ED Provider Notes (Signed)
CSN: [id number]     Arrival date & time 08/11/15  0902 History   First MD Initiated Contact with Patient 08/11/15 1014     CC: Bilateral flank pain, hematuria, painful urination  Patient is a 50 y.o. male presenting with dysuria. The history is provided by the patient and a relative. No language interpreter was used.  Dysuria This is a new problem. The current episode started in the past 7 days. The problem occurs constantly. The problem has been gradually worsening. Associated symptoms include abdominal pain and urinary symptoms. Pertinent negatives include no chest pain, chills, congestion, coughing, fatigue, fever, joint swelling, myalgias, nausea, neck pain, numbness, rash, vomiting or weakness. Nothing aggravates the symptoms. He has tried nothing for the symptoms. The treatment provided no relief.    Past Medical History  Diagnosis Date  . Diabetes mellitus without complication (HCC)   . CHF (congestive heart failure) (HCC)    History reviewed. No pertinent past surgical history. Family History  Problem Relation Age of Onset  . Heart attack    . Prostate cancer    . Hypertension    . Hypertension Mother   . Cancer Father    Social History  Substance Use Topics  . Smoking status: Former Smoker -- 1.00 packs/day for 20 years    Types: Cigarettes    Quit date: 08/09/2014  . Smokeless tobacco: Never Used  . Alcohol Use: No    Review of Systems  Constitutional: Negative for fever, chills and fatigue.  HENT: Negative for congestion, dental problem, sneezing and trouble swallowing.   Eyes: Negative for photophobia, discharge, itching and visual disturbance.  Respiratory: Negative for cough and wheezing.   Cardiovascular: Negative for chest pain, palpitations and leg swelling.  Gastrointestinal: Positive for abdominal pain. Negative for nausea, vomiting, diarrhea and constipation.  Endocrine: Negative for polydipsia, polyphagia and polyuria.  Genitourinary: Positive for dysuria,  frequency, hematuria, flank pain, decreased urine volume and difficulty urinating. Negative for urgency, discharge, penile swelling, scrotal swelling, penile pain and testicular pain.  Musculoskeletal: Negative for myalgias, joint swelling, gait problem, neck pain and neck stiffness.  Skin: Negative for rash.  Neurological: Negative for weakness and numbness.  Hematological: Negative for adenopathy.  Psychiatric/Behavioral: Negative for behavioral problems, confusion and agitation.  All other systems reviewed and are negative.     Allergies  Codeine and Gabapentin  Home Medications   Prior to Admission medications   Medication Sig Start Date End Date Taking? Authorizing Provider  amitriptyline (ELAVIL) 75 MG tablet Take 1 tablet (75 mg total) by mouth at bedtime. 08/10/15  Yes Jaclyn Shaggy, MD  atorvastatin (LIPITOR) 40 MG tablet Take 1 tablet (40 mg total) by mouth daily at 6 PM. 10/26/14  Yes Estela Isaiah Blakes, MD  ciprofloxacin (CIPRO) 500 MG tablet Take 1 tablet (500 mg total) by mouth 2 (two) times daily. 08/10/15  Yes Jaclyn Shaggy, MD  insulin aspart protamine- aspart (NOVOLOG MIX 70/30) (70-30) 100 UNIT/ML injection Inject 0.22 mLs (22 Units total) into the skin 2 (two) times daily with a meal. Patient taking differently: Inject 45 Units into the skin 2 (two) times daily with a meal.  10/26/14  Yes Henderson Cloud, MD  lisinopril (PRINIVIL,ZESTRIL) 2.5 MG tablet Take 2 tablets (5 mg total) by mouth daily. 08/10/15  Yes Jaclyn Shaggy, MD  metFORMIN (GLUCOPHAGE) 500 MG tablet Take 500 mg by mouth daily with breakfast.   Yes Historical Provider, MD  metoprolol tartrate (LOPRESSOR) 25 MG tablet Take 1 tablet (  25 mg total) by mouth 2 (two) times daily. 08/10/15  Yes Jaclyn Shaggy, MD  ondansetron (ZOFRAN) 4 MG tablet Take 1 tablet (4 mg total) by mouth every 6 (six) hours. 10/26/14  Yes Henderson Cloud, MD   BP 123/74 mmHg  Pulse 118  Temp(Src) 98.2 F (36.8 C) (Oral)   Resp 13  SpO2 96% Physical Exam  Constitutional: He is oriented to person, place, and time. He appears well-developed and well-nourished. No distress.  HENT:  Head: Normocephalic and atraumatic.  Eyes: Pupils are equal, round, and reactive to light. Right eye exhibits no discharge. Left eye exhibits no discharge.  Neck: Normal range of motion. No tracheal deviation present. No thyromegaly present.  Cardiovascular: Regular rhythm and normal pulses.  Tachycardia present.   Pulmonary/Chest: Effort normal and breath sounds normal. No respiratory distress. He has no wheezes. He has no rales. He exhibits no tenderness.  Abdominal: There is tenderness in the suprapubic area. There is CVA tenderness.  Genitourinary: Testes normal and penis normal. Right testis shows no swelling and no tenderness. Left testis shows no swelling and no tenderness. Circumcised. No penile tenderness. No discharge found.  Musculoskeletal: Normal range of motion. He exhibits no edema or tenderness.  Neurological: He is alert and oriented to person, place, and time. No cranial nerve deficit. Coordination normal.  Skin: Skin is warm and dry. He is not diaphoretic.  Psychiatric: He has a normal mood and affect. His behavior is normal.  Nursing note and vitals reviewed.   ED Course  Procedures (including critical care time) Labs Review Labs Reviewed  COMPREHENSIVE METABOLIC PANEL - Abnormal; Notable for the following:    Glucose, Bld 193 (*)    Albumin 3.0 (*)    ALT 9 (*)    All other components within normal limits  CBC WITH DIFFERENTIAL/PLATELET - Abnormal; Notable for the following:    WBC 25.2 (*)    RBC 4.19 (*)    Hemoglobin 12.2 (*)    HCT 36.6 (*)    Neutro Abs 22.3 (*)    All other components within normal limits  URINALYSIS, ROUTINE W REFLEX MICROSCOPIC (NOT AT [facility]) - Abnormal; Notable for the following:    APPearance TURBID (*)    Glucose, UA 500 (*)    Hgb urine dipstick LARGE (*)    Bilirubin  Urine SMALL (*)    Ketones, ur 15 (*)    Protein, ur >300 (*)    Nitrite POSITIVE (*)    Leukocytes, UA MODERATE (*)    All other components within normal limits  URINE MICROSCOPIC-ADD ON - Abnormal; Notable for the following:    Squamous Epithelial / LPF 0-5 (*)    Bacteria, UA MANY (*)    All other components within normal limits  GRAM STAIN  CULTURE, BLOOD (ROUTINE X 2)  CULTURE, BLOOD (ROUTINE X 2)  HIV ANTIBODY (ROUTINE TESTING)  I-STAT CG4 LACTIC ACID, ED    Imaging Review Ct Renal Stone Study  08/11/2015  CLINICAL DATA:  Bilateral flank pain for 1 week that has worsened. Difficulty urinating. History also mentions hematuria. EXAM: CT ABDOMEN AND PELVIS WITHOUT CONTRAST TECHNIQUE: Multidetector CT imaging of the abdomen and pelvis was performed following the standard protocol without IV contrast. COMPARISON:  None. FINDINGS: Lung bases: Minor dependent subsegmental atelectasis. Small nodules, 3 in right lower lobe and 1 peripheral right middle lobe, which is the largest measuring 5 mm. Heart is normal in size. At liver, spleen, gallbladder, pancreas:  Normal. Adrenal  glands: 2 cm left adrenal adenoma. Normal right adrenal gland. Kidneys, ureters, bladder: Normal kidneys in ureters. No stones or obstructive uropathy. Bladder is mostly decompressed with a Foley catheter. Wall may be mildly thickened. Reproductive: Prostate is enlarged and heterogeneous. There is a 3 x 2.9 x 2.4 cm irregular area of low attenuation within the right aspect the prostate suggesting an abscess. There is mild surrounding hazy inflammatory change and fat stranding extending around the seminal vesicles posterior bladder. No enlarged lymph nodes. Ascites:  None. Gastrointestinal: Stomach and small bowel are unremarkable. There is mild increased stool throughout the colon. No other colon abnormality. Normal appendix is visualized. There is some mild hazy opacity along posterior margin of the cecum consistent with  inflammatory changes arising from the pelvis. Musculoskeletal: Degenerative changes of the lower lumbar spine. No osteoblastic or osteolytic lesions. IMPRESSION: 1. 3 cm irregular low-attenuation area within the prostate consistent with an abscess. There is surrounding inflammatory changes. The bladder appears walled, which is likely reactive, but could reflect infectious cystitis. 2. No other acute findings. 3. Normal kidneys in ureters. 4. Normal appendix. 5. Mild increased stool throughout the colon. Electronically Signed   By: Amie Portland M.D.   On: 08/11/2015 13:39   I have personally reviewed and evaluated these images and lab results as part of my medical decision-making.   EKG Interpretation   Date/Time:  Friday August 11 2015 12:17:44 EST Ventricular Rate:  128 PR Interval:  136 QRS Duration: 92 QT Interval:  308 QTC Calculation: 449 R Axis:   63 Text Interpretation:  Sinus tachycardia Borderline T wave abnormalities  Confirmed by Denton Lank  MD, Caryn Bee ([id number]) on 08/11/2015 12:40:08 PM      MDM   Final diagnoses:  Prostate abscess  Leukocytosis    Patient with history of poorly controlled diabetes presents to emergency department today for evaluation of worsening abdominal pain, bilateral flank pain, hematuria. He is having difficulty urinating as well.  Patient seen outpatient yesterday diagnosed with UTI start on ciprofloxacin which she has been unable to fill.  Upon arrival patient afebrile. Heart rate 142 in sinus on monitor confirmed with EKG. Blood pressure stable at 107/73. He has a tender abdomen with a distended bladder on physical exam. CVA tenderness bilaterally.  Differential diagnosis includes renal colic versus UTI versus prostatitis.  UA grossly abnormal with large hemoglobin, positive nitrites, moderate leukocytes. White blood cell count 25.2. Kidney function okay. CT with 3 cm prostate abscess.  Patient denies history of HIV or otherwise immunocompromised  state.  He denies sex with men.   2:58 PM: Discuss case with urologist on call who will review images and call back. Recommend IV antibiotics and admission to hospitalist.  Ordered zosyn/rocephin for prostate abscess.  HIV test pending after discussion with patient.    3:11 PM: Hospitalist will admit patient. Urology recommendations still pending.  Discussed with my attending, Dr. Denton Lank.    Dan Humphreys, MD 08/11/15 1528  Cathren Laine, MD 08/12/15 [phone]

## 2015-08-11 NOTE — Consult Note (Signed)
Urology Consult   Physician requesting consult: Nani Skillern  Reason for consult: UTI and prostate abscess  History of Present Illness: Rickey Smith is a 50 y.o. male with PMH significant for DM, tachycardia, diabetic neuropathy, and DKA who presented to the ED with c/o lower abdominal pain, dysuria, inability to void, and bilateral flank pain.  Pt was recently admitted 2/8-2/10/17 for treatment of SIRS due to dehydration associated with hyperglycemia and hyperosmolarity.  He was seen in outpatient f/u on 08/10/15 for this with as well as new c/o dysuria and difficulty voiding that had been present for about a week.  He was found to have a UTI and given a script for Cipro which he did not start.  Overnight,  the dysuria and lower abdominal pain have increased and he developed retention.  Since last night he has only been able to void drops of blood.  He denies fevers, chills, HA, CP, nausea, and vomiting. Pt is currently resting comfortably.   In the ED UA is nitrite positive with large RBCs, WBCs, and bacteria.  CT A/P reveals normal kidneys and ureters with a 3cm irregular low-attentuation area in the prostate consistent with an abscess. WBC 25, Cr 0.94.  Foley was placed with >600cc dark red urine returned. Blood and urine cultures were collected.  Pt was started on Rocephin and Zosyn.  He denies a history of voiding or storage urinary symptoms, hematuria, UTIs, STDs, urolithiasis, GU malignancy/trauma/surgery.  He has not been sexually active for several years.  He quit smoking 1 year ago (smoked 1ppd x 35 years) and has been sober since 2005.    Of note, he was seen in the ED 03/2015 for migraines and found to have a 3mm ICA aneurysm for which he was referred for outpatient f/u.  He has not gone due to money issues.  He had a tooth pulled last week due to an abscess and states he was told he has several more abscessed teeth that would have to be pulled at a later date.  He has not been on  Abx for these.       Past Medical History  Diagnosis Date  . Diabetes mellitus without complication (HCC)   multiple tooth abscesses Possible ICA aneurysm Diabetic neuropathy GERD tachycardia  History reviewed. No pertinent past surgical history.  Current Hospital Medications:  Home Meds:    Medication List    ASK your doctor about these medications        amitriptyline 75 MG tablet  Commonly known as:  ELAVIL  Take 1 tablet (75 mg total) by mouth at bedtime.     atorvastatin 40 MG tablet  Commonly known as:  LIPITOR  Take 1 tablet (40 mg total) by mouth daily at 6 PM.     ciprofloxacin 500 MG tablet  Commonly known as:  CIPRO  Take 1 tablet (500 mg total) by mouth 2 (two) times daily.     insulin aspart protamine- aspart (70-30) 100 UNIT/ML injection  Commonly known as:  NOVOLOG MIX 70/30  Inject 0.22 mLs (22 Units total) into the skin 2 (two) times daily with a meal.     lisinopril 2.5 MG tablet  Commonly known as:  PRINIVIL,ZESTRIL  Take 2 tablets (5 mg total) by mouth daily.     metFORMIN 500 MG tablet  Commonly known as:  GLUCOPHAGE  Take 500 mg by mouth daily with breakfast.     metoprolol tartrate 25 MG tablet  Commonly known as:  LOPRESSOR  Take 1 tablet (25 mg total) by mouth 2 (two) times daily.     ondansetron 4 MG tablet  Commonly known as:  ZOFRAN  Take 1 tablet (4 mg total) by mouth every 6 (six) hours.        Scheduled Meds: . amitriptyline  75 mg Oral QHS  . atorvastatin  40 mg Oral q1800  . enoxaparin (LOVENOX) injection  40 mg Subcutaneous Q24H  . insulin aspart  0-15 Units Subcutaneous TID WC  . insulin aspart  0-5 Units Subcutaneous QHS  . metoprolol tartrate  25 mg Oral BID  . sodium chloride flush  3 mL Intravenous Q12H   Continuous Infusions: . sodium chloride    . piperacillin-tazobactam 3.375 g (08/11/15 1648)  . piperacillin-tazobactam (ZOSYN)  IV     PRN Meds:.acetaminophen **OR** acetaminophen, bisacodyl,  diphenhydrAMINE, HYDROcodone-acetaminophen, HYDROmorphone (DILAUDID) injection, ondansetron **OR** ondansetron (ZOFRAN) IV, traZODone  Allergies:  Allergies  Allergen Reactions  . Codeine Itching  . Gabapentin Diarrhea and Nausea And Vomiting    Family History  Problem Relation Age of Onset  . Heart attack    . Prostate cancer    . Hypertension    . Hypertension Mother   . Cancer Father   prostate cancer father treated with radiation and possibly chemo ( developed bone mets)  Social History:  reports that he quit smoking about a year ago. His smoking use included Cigarettes. He has a 20 pack-year smoking history. He has never used smokeless tobacco. He reports that he does not drink alcohol or use illicit drugs.  ROS: A complete review of systems was performed.  All systems are negative except for pertinent findings as noted.  Physical Exam:  Vital signs in last 24 hours: Temp:  [98.2 F (36.8 C)] 98.2 F (36.8 C) (02/17 0916) Pulse Rate:  [114-142] 114 (02/17 1545) Resp:  [13-24] 23 (02/17 1545) BP: (107-131)/(73-81) 118/77 mmHg (02/17 1545) SpO2:  [96 %-99 %] 99 % (02/17 1545) Constitutional:  Alert and oriented, No acute distress Cardiovascular: Regular rate and rhythm Respiratory: Normal respiratory effort GI: Abdomen is soft, nontender, nondistended, no abdominal masses GU: circ penis; no lesions noted; foley in place with dark red urine in bag; prostate exam deferred  Lymphatic: No lymphadenopathy Neurologic: Grossly intact, no focal deficits Psychiatric: Normal mood and affect  Laboratory Data:   Recent Labs  08/11/15 1058  WBC 25.2*  HGB 12.2*  HCT 36.6*  PLT 386     Recent Labs  08/11/15 1058  NA 137  K 4.4  CL 101  GLUCOSE 193*  BUN 9  CALCIUM 9.1  CREATININE 0.94     Results for orders placed or performed during the hospital encounter of 08/11/15 (from the past 24 hour(s))  Comprehensive metabolic panel     Status: Abnormal   Collection  Time: 08/11/15 10:58 AM  Result Value Ref Range   Sodium 137 135 - 145 mmol/L   Potassium 4.4 3.5 - 5.1 mmol/L   Chloride 101 101 - 111 mmol/L   CO2 24 22 - 32 mmol/L   Glucose, Bld 193 (H) 65 - 99 mg/dL   BUN 9 6 - 20 mg/dL   Creatinine, Ser 4.09 0.61 - 1.24 mg/dL   Calcium 9.1 8.9 - 81.1 mg/dL   Total Protein 7.0 6.5 - 8.1 g/dL   Albumin 3.0 (L) 3.5 - 5.0 g/dL   AST 15 15 - 41 U/L   ALT 9 (L) 17 - 63 U/L   Alkaline Phosphatase 105  38 - 126 U/L   Total Bilirubin 0.3 0.3 - 1.2 mg/dL   GFR calc non Af Amer >60 >60 mL/min   GFR calc Af Amer >60 >60 mL/min   Anion gap 12 5 - 15  CBC with Differential     Status: Abnormal   Collection Time: 08/11/15 10:58 AM  Result Value Ref Range   WBC 25.2 (H) 4.0 - 10.5 K/uL   RBC 4.19 (L) 4.22 - 5.81 MIL/uL   Hemoglobin 12.2 (L) 13.0 - 17.0 g/dL   HCT 40.9 (L) 81.1 - 91.4 %   MCV 87.4 78.0 - 100.0 fL   MCH 29.1 26.0 - 34.0 pg   MCHC 33.3 30.0 - 36.0 g/dL   RDW 78.2 95.6 - 21.3 %   Platelets 386 150 - 400 K/uL   Neutrophils Relative % 89 %   Lymphocytes Relative 7 %   Monocytes Relative 3 %   Eosinophils Relative 1 %   Basophils Relative 0 %   Neutro Abs 22.3 (H) 1.7 - 7.7 K/uL   Lymphs Abs 1.8 0.7 - 4.0 K/uL   Monocytes Absolute 0.8 0.1 - 1.0 K/uL   Eosinophils Absolute 0.3 0.0 - 0.7 K/uL   Basophils Absolute 0.0 0.0 - 0.1 K/uL   WBC Morphology INCREASED BANDS (>20% BANDS)   Urinalysis, Routine w reflex microscopic (not at [facility])     Status: Abnormal   Collection Time: 08/11/15 11:53 AM  Result Value Ref Range   Color, Urine YELLOW YELLOW   APPearance TURBID (A) CLEAR   Specific Gravity, Urine 1.025 1.005 - 1.030   pH 6.5 5.0 - 8.0   Glucose, UA 500 (A) NEGATIVE mg/dL   Hgb urine dipstick LARGE (A) NEGATIVE   Bilirubin Urine SMALL (A) NEGATIVE   Ketones, ur 15 (A) NEGATIVE mg/dL   Protein, ur >086 (A) NEGATIVE mg/dL   Nitrite POSITIVE (A) NEGATIVE   Leukocytes, UA MODERATE (A) NEGATIVE  Gram stain     Status: None   Collection  Time: 08/11/15 11:53 AM  Result Value Ref Range   Specimen Description URINE, CLEAN CATCH    Special Requests Normal    Gram Stain      ABUNDANT WBC PRESENT, PREDOMINANTLY PMN ABUNDANT GRAM POSITIVE COCCI IN CLUSTERS    Report Status 08/11/2015 FINAL   Urine microscopic-add on     Status: Abnormal   Collection Time: 08/11/15 11:53 AM  Result Value Ref Range   Squamous Epithelial / LPF 0-5 (A) NONE SEEN   WBC, UA TOO NUMEROUS TO COUNT 0 - 5 WBC/hpf   RBC / HPF TOO NUMEROUS TO COUNT 0 - 5 RBC/hpf   Bacteria, UA MANY (A) NONE SEEN   Urine-Other MUCOUS PRESENT   I-Stat CG4 Lactic Acid, ED     Status: None   Collection Time: 08/11/15  2:34 PM  Result Value Ref Range   Lactic Acid, Venous 0.83 0.5 - 2.0 mmol/L   Recent Results (from the past 240 hour(s))  Culture, blood (routine x 2)     Status: None   Collection Time: 08/02/15  6:30 PM  Result Value Ref Range Status   Specimen Description BLOOD RIGHT HAND  Final   Special Requests IN PEDIATRIC BOTTLE 3CC  Final   Culture NO GROWTH 5 DAYS  Final   Report Status 08/07/2015 FINAL  Final  Culture, blood (routine x 2)     Status: None   Collection Time: 08/02/15  6:35 PM  Result Value Ref Range Status  Specimen Description BLOOD LEFT ANTECUBITAL  Final   Special Requests BOTTLES DRAWN AEROBIC AND ANAEROBIC 5CC  Final   Culture NO GROWTH 5 DAYS  Final   Report Status 08/07/2015 FINAL  Final  Gram stain     Status: None   Collection Time: 08/11/15 11:53 AM  Result Value Ref Range Status   Specimen Description URINE, CLEAN CATCH  Final   Special Requests Normal  Final   Gram Stain   Final    ABUNDANT WBC PRESENT, PREDOMINANTLY PMN ABUNDANT GRAM POSITIVE COCCI IN CLUSTERS    Report Status 08/11/2015 FINAL  Final    Renal Function:  Recent Labs  08/11/15 1058  CREATININE 0.94   Estimated Creatinine Clearance: 96.8 mL/min (by C-G formula based on Cr of 0.94).  Radiologic Imaging: Ct Renal Stone Study  08/11/2015  CLINICAL  DATA:  Bilateral flank pain for 1 week that has worsened. Difficulty urinating. History also mentions hematuria. EXAM: CT ABDOMEN AND PELVIS WITHOUT CONTRAST TECHNIQUE: Multidetector CT imaging of the abdomen and pelvis was performed following the standard protocol without IV contrast. COMPARISON:  None. FINDINGS: Lung bases: Minor dependent subsegmental atelectasis. Small nodules, 3 in right lower lobe and 1 peripheral right middle lobe, which is the largest measuring 5 mm. Heart is normal in size. At liver, spleen, gallbladder, pancreas:  Normal. Adrenal glands: 2 cm left adrenal adenoma. Normal right adrenal gland. Kidneys, ureters, bladder: Normal kidneys in ureters. No stones or obstructive uropathy. Bladder is mostly decompressed with a Foley catheter. Wall may be mildly thickened. Reproductive: Prostate is enlarged and heterogeneous. There is a 3 x 2.9 x 2.4 cm irregular area of low attenuation within the right aspect the prostate suggesting an abscess. There is mild surrounding hazy inflammatory change and fat stranding extending around the seminal vesicles posterior bladder. No enlarged lymph nodes. Ascites:  None. Gastrointestinal: Stomach and small bowel are unremarkable. There is mild increased stool throughout the colon. No other colon abnormality. Normal appendix is visualized. There is some mild hazy opacity along posterior margin of the cecum consistent with inflammatory changes arising from the pelvis. Musculoskeletal: Degenerative changes of the lower lumbar spine. No osteoblastic or osteolytic lesions. IMPRESSION: 1. 3 cm irregular low-attenuation area within the prostate consistent with an abscess. There is surrounding inflammatory changes. The bladder appears walled, which is likely reactive, but could reflect infectious cystitis. 2. No other acute findings. 3. Normal kidneys in ureters. 4. Normal appendix. 5. Mild increased stool throughout the colon. Electronically Signed   By: Amie Portland  M.D.   On: 08/11/2015 13:39     Impression/Recommendation  UTI with prostate abscess--admit for IV ABx and monitor closely.  If pt remains stable (AF and WBC improves) he may be able to avoid further intervention.  Will need repeat CT of pelvis early next week.   If he develops fevers or increased WBC despite ABx he will require TURP with unroofing of abscess at [facility].  F/u urine culture and change ABx if needed.  Ok to maintain foley for now but will need void trial prior to d/c.  Retention should improve with resolution of infection. Consider adding flomax short term. Dr. Isabel Caprice will eval later today and follow during admission.    Agree with history and exam as above. Probable early prostatic abscess. This may resolve with intervenous antibiotics. If his clinical situation does not improve or if he does develop improvement but then has a decline in his clinical status it is likely he would  then require transurethral unroofing of the prostate abscess. Would recommend repeat pelvic CT in 48-72 hours. DANCY, AMANDA 08/11/2015, 5:11 PM

## 2015-08-11 NOTE — H&P (Signed)
[facility] History and Physical  Rickey Smith ZOX:[id number] DOB: September 02, 1965 DOA: 08/11/2015  Referring physician: m. [facility] resident PCP: No PCP Per Patient   Chief Complaint: hematuria, dysuria, urinary retention  HPI: Rickey Smith is a 50 y.o. male with a past medical history that includes hypertension, diabetes, with recent hospitalization for SIRS due to dehydration associated with hyperglycemia and hyperosmolarity presents to the emergency department with the chief complaint hematuria dysuria bilateral flank pain. Initial evaluation reveals a urinary tract infection and CT of the abdomen pelvis concerning for prostate abscess, leukocytosis and tachycardia   Information obtained from the patient. He reports about a week ago he developed mild to moderate pressure in his suprapubic area feeling like "always had the p.m.". He was seen by care diagnosed with urinary tract infection and provided with antibiotics. He was unable to get the antibiotics filled. The pain increased over time. Since he had symptoms include dysuria and hematuria. In addition he developed pain scrotal area worse with urination. He reports the pain became so intense he was pacing the floor. Denies chest pain palpitation headache dizziness syncope or near-syncope. He denies a fever chills abdominal pain nausea or vomiting or diarrhea.  Emergency department he is afebrile hemodynamically stable mildly tachycardic not hypoxic he is provided with IV fluids Rocephin and Zosyn   Review of Systems:  10 point review of systems completed all systems are negative except as indicated in the history of present illness  Past Medical History  Diagnosis Date  . Diabetes mellitus without complication (HCC)    History reviewed. No pertinent past surgical history. Social History:  reports that he quit smoking about a year ago. His smoking use included Cigarettes. He has a 20 pack-year smoking history. He has never used  smokeless tobacco. He reports that he does not drink alcohol or use illicit drugs. He lives at home alone he's unemployed he is independent with ADLs no recent falls Allergies  Allergen Reactions  . Codeine Itching  . Gabapentin Diarrhea and Nausea And Vomiting    Family History  Problem Relation Age of Onset  . Heart attack    . Prostate cancer    . Hypertension    . Hypertension Mother   . Cancer Father      Prior to Admission medications   Medication Sig Start Date End Date Taking? Authorizing Provider  amitriptyline (ELAVIL) 75 MG tablet Take 1 tablet (75 mg total) by mouth at bedtime. 08/10/15  Yes Jaclyn Shaggy, MD  atorvastatin (LIPITOR) 40 MG tablet Take 1 tablet (40 mg total) by mouth daily at 6 PM. 10/26/14  Yes Estela Isaiah Blakes, MD  ciprofloxacin (CIPRO) 500 MG tablet Take 1 tablet (500 mg total) by mouth 2 (two) times daily. 08/10/15  Yes Jaclyn Shaggy, MD  insulin aspart protamine- aspart (NOVOLOG MIX 70/30) (70-30) 100 UNIT/ML injection Inject 0.22 mLs (22 Units total) into the skin 2 (two) times daily with a meal. Patient taking differently: Inject 45 Units into the skin 2 (two) times daily with a meal.  10/26/14  Yes Henderson Cloud, MD  lisinopril (PRINIVIL,ZESTRIL) 2.5 MG tablet Take 2 tablets (5 mg total) by mouth daily. 08/10/15  Yes Jaclyn Shaggy, MD  metFORMIN (GLUCOPHAGE) 500 MG tablet Take 500 mg by mouth daily with breakfast.   Yes Historical Provider, MD  metoprolol tartrate (LOPRESSOR) 25 MG tablet Take 1 tablet (25 mg total) by mouth 2 (two) times daily. 08/10/15  Yes Jaclyn Shaggy, MD  ondansetron (  ZOFRAN) 4 MG tablet Take 1 tablet (4 mg total) by mouth every 6 (six) hours. 10/26/14  Yes Henderson Cloud, MD   Physical Exam: Filed Vitals:   08/11/15 0916 08/11/15 1300 08/11/15 1315 08/11/15 1545  BP: 107/73 131/81 123/74 118/77  Pulse: 142 121 118 114  Temp: 98.2 F (36.8 C)     TempSrc: Oral     Resp: SpO2: 97% 97% 96% 99%     Wt Readings from Last 3 Encounters:  08/10/15 71.215 kg (157 lb)  08/02/15 73.755 kg (162 lb 9.6 oz)  05/23/15 79.379 kg (175 lb)    General:  Appears calm and comfortable Eyes: PERRL, normal lids, irises & conjunctiva ENT: grossly normal hearing, his membranes of his mouth are pink and very dry very poor dentition Neck: no LAD, masses or thyromegaly Cardiovascular: Tachycardic but regular no m/r/g. No LE edema.  Respiratory: CTA bilaterally, no w/r/r. Normal respiratory effort. Abdomen: soft, flat moderate tenderness suprapubic area. Bilateral flank tenderness Skin: no rash or induration seen on limited exam Musculoskeletal: grossly normal tone BUE/BLE Psychiatric: grossly normal mood and affect, speech fluent and appropriate Neurologic: grossly non-focal. Speech clear facial symetry          Labs on Admission:  Basic Metabolic Panel:  Recent Labs Lab 08/11/15 1058  NA 137  K 4.4  CL 101  CO2 24  GLUCOSE 193*  BUN 9  CREATININE 0.94  CALCIUM 9.1   Liver Function Tests:  Recent Labs Lab 08/11/15 1058  AST 15  ALT 9*  ALKPHOS 105  BILITOT 0.3  PROT 7.0  ALBUMIN 3.0*   No results for input(s): LIPASE, AMYLASE in the last 168 hours. No results for input(s): AMMONIA in the last 168 hours. CBC:  Recent Labs Lab 08/11/15 1058  WBC 25.2*  NEUTROABS 22.3*  HGB 12.2*  HCT 36.6*  MCV 87.4  PLT 386   Cardiac Enzymes: No results for input(s): CKTOTAL, CKMB, CKMBINDEX, TROPONINI in the last 168 hours.  BNP (last 3 results)  Recent Labs  08/02/15 1306  BNP 30.1    ProBNP (last 3 results) No results for input(s): PROBNP in the last 8760 hours.  CBG: No results for input(s): GLUCAP in the last 168 hours.  Radiological Exams on Admission: Ct Renal Stone Study  08/11/2015  CLINICAL DATA:  Bilateral flank pain for 1 week that has worsened. Difficulty urinating. History also mentions hematuria. EXAM: CT ABDOMEN AND PELVIS WITHOUT CONTRAST TECHNIQUE:  Multidetector CT imaging of the abdomen and pelvis was performed following the standard protocol without IV contrast. COMPARISON:  None. FINDINGS: Lung bases: Minor dependent subsegmental atelectasis. Small nodules, 3 in right lower lobe and 1 peripheral right middle lobe, which is the largest measuring 5 mm. Heart is normal in size. At liver, spleen, gallbladder, pancreas:  Normal. Adrenal glands: 2 cm left adrenal adenoma. Normal right adrenal gland. Kidneys, ureters, bladder: Normal kidneys in ureters. No stones or obstructive uropathy. Bladder is mostly decompressed with a Foley catheter. Wall may be mildly thickened. Reproductive: Prostate is enlarged and heterogeneous. There is a 3 x 2.9 x 2.4 cm irregular area of low attenuation within the right aspect the prostate suggesting an abscess. There is mild surrounding hazy inflammatory change and fat stranding extending around the seminal vesicles posterior bladder. No enlarged lymph nodes. Ascites:  None. Gastrointestinal: Stomach and small bowel are unremarkable. There is mild increased stool throughout the colon. No other colon abnormality. Normal appendix is visualized.  There is some mild hazy opacity along posterior margin of the cecum consistent with inflammatory changes arising from the pelvis. Musculoskeletal: Degenerative changes of the lower lumbar spine. No osteoblastic or osteolytic lesions. IMPRESSION: 1. 3 cm irregular low-attenuation area within the prostate consistent with an abscess. There is surrounding inflammatory changes. The bladder appears walled, which is likely reactive, but could reflect infectious cystitis. 2. No other acute findings. 3. Normal kidneys in ureters. 4. Normal appendix. 5. Mild increased stool throughout the colon. Electronically Signed   By: Amie Portland M.D.   On: 08/11/2015 13:39    EKG: Independently reviewed tachycardia   Assessment/Plan Principal Problem:   Prostate abscess Active Problems:   Leukocytosis    GERD (gastroesophageal reflux disease)   Tachycardia   Diabetic neuropathy (HCC)   UTI (lower urinary tract infection)  #1. Prostate abscess. CT of the abdomen pelvis with irregular low-attenuation area within the prostate consistent with an abscess. Leukocytosis of 25.2 he is afebrile hemodynamically stable but tachycardic not hypoxic. Lactic acid within the limits of normal -Admit to telemetry -Continue Rocephin and zoayn started in the emergency department -Await urology recommendations -Nothing by mouth past midnight  #2. Urinary tract infection. Analysis consistent with UTI. Will await urine culture. See #1. -Await urine culture -Continue Foley  3. Tachycardia/ leukocytosis. Likely related to above in the setting of hyperglycemia. He is afebrile he is nontoxic appearing lactic acid is within the limits of normal hemodynamically stable. No chest pain -continue vigorous IV fluids -Monitor on telemetry -Trend lactic acid  4. Diabetes. He is on 70/30 insulin at home. Serum glucose 193 on admission -Obtain a hemoglobin A1c -Use sliding scale insulin for optimal control   uarology  Code Status: full DVT Prophylaxis: Family Communication: mom at bedside Disposition Plan: home when ready  Time spent: 67 minutes  [facility] M [facility]

## 2015-08-11 NOTE — ED Notes (Signed)
[facility] and dx with URI had rx called in but it was not ready  And pharmacy is out of the way. Has had back pain and some blood in urine states has been to sick to make it to pharmacy to pick it up this am urine was checked yesterday he states

## 2015-08-12 DIAGNOSIS — R000 Tachycardia, unspecified: Secondary | ICD-10-CM

## 2015-08-12 DIAGNOSIS — A419 Sepsis, unspecified organism: Secondary | ICD-10-CM

## 2015-08-12 LAB — COMPREHENSIVE METABOLIC PANEL
ALBUMIN: 2.4 g/dL — AB (ref 3.5–5.0)
ALT: 8 U/L — AB (ref 17–63)
AST: 8 U/L — AB (ref 15–41)
Alkaline Phosphatase: 76 U/L (ref 38–126)
Anion gap: 11 (ref 5–15)
BILIRUBIN TOTAL: 0.4 mg/dL (ref 0.3–1.2)
CHLORIDE: 100 mmol/L — AB (ref 101–111)
CO2: 26 mmol/L (ref 22–32)
CREATININE: 0.83 mg/dL (ref 0.61–1.24)
Calcium: 8.7 mg/dL — ABNORMAL LOW (ref 8.9–10.3)
GFR calc Af Amer: >60 mL/min (ref >60)
GFR calc non Af Amer: >60 mL/min (ref >60)
GLUCOSE: 257 mg/dL — AB (ref 65–99)
POTASSIUM: 4.6 mmol/L (ref 3.5–5.1)
Sodium: 137 mmol/L (ref 135–145)
Total Protein: 5.5 g/dL — ABNORMAL LOW (ref 6.5–8.1)

## 2015-08-12 LAB — GLUCOSE, CAPILLARY
GLUCOSE-CAPILLARY: 254 mg/dL — AB (ref 65–99)
GLUCOSE-CAPILLARY: 155 mg/dL — AB (ref 65–99)
GLUCOSE-CAPILLARY: 298 mg/dL — AB (ref 65–99)
GLUCOSE-CAPILLARY: 285 mg/dL — AB (ref 65–99)

## 2015-08-12 LAB — CBC
HEMATOCRIT: 35.1 % — AB (ref 39.0–52.0)
Hemoglobin: 10.9 g/dL — ABNORMAL LOW (ref 13.0–17.0)
MCH: 27.6 pg (ref 26.0–34.0)
MCHC: 31.1 g/dL (ref 30.0–36.0)
MCV: 88.9 fL (ref 78.0–100.0)
Platelets: 325 10*3/uL (ref 150–400)
RBC: 3.95 MIL/uL — ABNORMAL LOW (ref 4.22–5.81)
RDW: 12.9 % (ref 11.5–15.5)
WBC: 18.2 10*3/uL — ABNORMAL HIGH (ref 4.0–10.5)

## 2015-08-12 LAB — HEMOGLOBIN A1C
Hgb A1c MFr Bld: 10.5 % — ABNORMAL HIGH (ref 4.8–5.6)
Mean Plasma Glucose: 255 mg/dL

## 2015-08-12 LAB — HIV ANTIBODY (ROUTINE TESTING W REFLEX): HIV SCREEN 4TH GENERATION: NONREACTIVE

## 2015-08-12 MED ORDER — TAMSULOSIN HCL 0.4 MG PO CAPS
0.4000 mg | ORAL_CAPSULE | Freq: Every day | ORAL | Status: DC
  Administered 2015-08-12 – 2015-08-17 (×6): 0.4 mg via ORAL
  Filled 2015-08-12 (×6): qty 1

## 2015-08-12 MED ORDER — POLYETHYLENE GLYCOL 3350 17 G PO PACK
17.0000 g | PACK | Freq: Two times a day (BID) | ORAL | Status: DC
  Administered 2015-08-12 – 2015-08-16 (×8): 17 g via ORAL
  Filled 2015-08-12 (×12): qty 1

## 2015-08-12 MED ORDER — SODIUM CHLORIDE 0.9 % IV SOLN: INTRAVENOUS | Status: AC

## 2015-08-12 MED ORDER — INSULIN DETEMIR 100 UNIT/ML ~~LOC~~ SOLN
5.0000 [IU] | Freq: Two times a day (BID) | SUBCUTANEOUS | Status: DC
  Administered 2015-08-12 – 2015-08-13 (×3): 5 [IU] via SUBCUTANEOUS
  Filled 2015-08-12 (×4): qty 0.05

## 2015-08-12 NOTE — Progress Notes (Signed)
  Subjective: Patient reports feeling better. Diabetic on Zosyn. Note multiple dental carries.  Per Dr. Isabel Caprice note: CT f/u next week.   Objective: Vital signs in last 24 hours: Temp:  [98.2 F (36.8 C)-98.6 F (37 C)] 98.2 F (36.8 C) (02/18 1355) Pulse Rate:  [93-117] 100 (02/18 1355) Resp:  [18-24] 18 (02/18 1355) BP: (106-135)/(59-88) 116/74 mmHg (02/18 1355) SpO2:  [96 %-100 %] 97 % (02/18 1355) Weight:  [71.804 kg (158 lb 4.8 oz)] 71.804 kg (158 lb 4.8 oz) (02/17 1952)A  Intake/Output from previous day: 02/17 0701 - 02/18 0700 In: 5148.3 [P.O.:750; I.V.:4298.3; IV Piggyback:100] Out: 3000 [Urine:3000] Intake/Output this shift: Total I/O In: -  Out: 1850 [Urine:1850]  Past Medical History  Diagnosis Date  . Diabetes mellitus without complication [facility])     Physical Exam:  Lungs - Normal respiratory effort, chest expands symmetrically.  Abdomen - Soft, non-tender & non-distended.  Lab Results:  Recent Labs  08/11/15 1058 08/12/15 0542  WBC 25.2* 18.2*  HGB 12.2* 10.9*  HCT 36.6* 35.1*   BMET  Recent Labs  08/11/15 1058 08/12/15 0542  NA 137 137  K 4.4 4.6  CL 101 100*  CO2 24 26  GLUCOSE 193* 257*  BUN 9 <5*  CREATININE 0.94 0.83  CALCIUM 9.1 8.7*   No results for input(s): LABURIN in the last 72 hours. Results for orders placed or performed during the hospital encounter of 08/11/15  Gram stain     Status: None   Collection Time: 08/11/15 11:53 AM  Result Value Ref Range Status   Specimen Description URINE, CLEAN CATCH  Final   Special Requests Normal  Final   Gram Stain   Final    ABUNDANT WBC PRESENT, PREDOMINANTLY PMN ABUNDANT GRAM POSITIVE COCCI IN CLUSTERS    Report Status 08/11/2015 FINAL  Final  Culture, Urine     Status: None (Preliminary result)   Collection Time: 08/11/15 11:53 AM  Result Value Ref Range Status   Specimen Description URINE, CATHETERIZED  Final   Special Requests NONE  Final   Culture TOO YOUNG TO READ  Final   Report Status PENDING  Incomplete    Studies/Results: No results found.  Assessment: Pt clinically iomproved. WBC improved.   Plan:  CT next week.   Rickey Smith I Rickey Smith 08/12/2015, 2:40 PM

## 2015-08-12 NOTE — Progress Notes (Signed)
[facility] PROGRESS NOTE    Progress Note   Rickey Smith ZOX:[id number] DOB: 1966-05-23 DOA: Aug 14, 2015 PCP: No PCP Per Patient   Brief Narrative:   Rickey Smith is an 50 y.o. male   Assessment/Plan:   Sepsis due Prostate abscess: CT scan of the abdomen and pelvis show low-attenuation of the prostate consistent with abscess. Urology was consulted recommended to continue IV Zosyn and repeat a CT scan 24-48Hrs. Urine cultures percent and are pending. Keep Foley in place, as retention should improve as infection resolves. Oral Flomax.  Tachycardia: Likely due to pain as he has hyperglycemia. Now Resolved.  Uncontrolled Diabetic neuropathy (HCC): A1c was 10.5. Blood glucose was high, his metformin was held, continue sliding scale insulin, add long-acting insulin twice a day. Continue CBGs before meals and at bedtime.  DVT Prophylaxis - Lovenox ordered.  Family Communication: none Disposition Plan: Home 3-4 days Code Status:     Code Status Orders        Start     Ordered   08/14/15 1512  Full code   Continuous     14-Aug-2015 1513    Code Status History    Date Active Date Inactive Code Status Order ID Comments User Context   08/02/2015  7:12 PM 08/04/2015  4:43 PM Full Code [id number]  Zannie Cove, MD Inpatient   10/23/2014 12:32 PM 10/26/2014  6:31 PM Full Code [id number]  Marinda Elk, MD Inpatient   07/13/2014  9:28 AM 07/14/2014  5:05 PM Full Code [id number]  Jerald Kief, MD Inpatient   04/26/2014 12:51 PM 04/28/2014  7:04 PM Full Code [id number]  Rhetta Mura, MD Inpatient        IV Access:    Peripheral IV   Procedures and diagnostic studies:   Ct Renal Stone Study  August 14, 2015  CLINICAL DATA:  Bilateral flank pain for 1 week that has worsened. Difficulty urinating. History also mentions hematuria. EXAM: CT ABDOMEN AND PELVIS WITHOUT CONTRAST TECHNIQUE: Multidetector CT imaging of the abdomen and pelvis was performed following the  standard protocol without IV contrast. COMPARISON:  None. FINDINGS: Lung bases: Minor dependent subsegmental atelectasis. Small nodules, 3 in right lower lobe and 1 peripheral right middle lobe, which is the largest measuring 5 mm. Heart is normal in size. At liver, spleen, gallbladder, pancreas:  Normal. Adrenal glands: 2 cm left adrenal adenoma. Normal right adrenal gland. Kidneys, ureters, bladder: Normal kidneys in ureters. No stones or obstructive uropathy. Bladder is mostly decompressed with a Foley catheter. Wall may be mildly thickened. Reproductive: Prostate is enlarged and heterogeneous. There is a 3 x 2.9 x 2.4 cm irregular area of low attenuation within the right aspect the prostate suggesting an abscess. There is mild surrounding hazy inflammatory change and fat stranding extending around the seminal vesicles posterior bladder. No enlarged lymph nodes. Ascites:  None. Gastrointestinal: Stomach and small bowel are unremarkable. There is mild increased stool throughout the colon. No other colon abnormality. Normal appendix is visualized. There is some mild hazy opacity along posterior margin of the cecum consistent with inflammatory changes arising from the pelvis. Musculoskeletal: Degenerative changes of the lower lumbar spine. No osteoblastic or osteolytic lesions. IMPRESSION: 1. 3 cm irregular low-attenuation area within the prostate consistent with an abscess. There is surrounding inflammatory changes. The bladder appears walled, which is likely reactive, but could reflect infectious cystitis. 2. No other acute findings. 3. Normal kidneys in ureters. 4. Normal appendix. 5. Mild increased stool throughout the colon. Electronically Signed  By: Amie Portland M.D.   On: 08/11/2015 13:39     Medical Consultants:    None.  Anti-Infectives:   Anti-infectives    Start     Dose/Rate Route Frequency Ordered Stop   08/11/15 1645  piperacillin-tazobactam (ZOSYN) IVPB 3.375 g     3.375 g 12.5 mL/hr  over 240 Minutes Intravenous 3 times per day 08/11/15 1642     08/11/15 1400  cefTRIAXone (ROCEPHIN) 2 g in dextrose 5 % 50 mL IVPB     2 g 100 mL/hr over 30 Minutes Intravenous  Once 08/11/15 1345 08/11/15 1613   08/11/15 1400  piperacillin-tazobactam (ZOSYN) IVPB 3.375 g     3.375 g 100 mL/hr over 30 Minutes Intravenous  Once 08/11/15 1345 08/11/15 1718      Subjective:    Rickey Smith he relates the pain is controlled now he is hungry and is able to tolerate a diet  Objective:    Filed Vitals:   08/11/15 1722 08/11/15 1815 08/11/15 1952 08/12/15 0419  BP: 132/81 135/88 126/79 106/59  Pulse: 117 110 103 93  Temp:   98.6 F (37 C) 98.5 F (36.9 C)  TempSrc:   Oral Oral  Resp:  Height:    (1.854 m)   Weight:   71.804 kg (158 lb 4.8 oz)   SpO2:  100% 96% 96%    Intake/Output Summary (Last 24 hours) at 08/12/15 0928 Last data filed at 08/12/15 0835  Gross per 24 hour  Intake 5148.33 ml  Output   4000 ml  Net 1148.33 ml   Filed Weights   08/11/15 1952  Weight: 71.804 kg (158 lb 4.8 oz)    Exam: Gen:  NAD Cardiovascular:  RRR. Chest and lungs:   CTAB Abdomen:  Abdomen soft, NT/ND, + BS Extremities:  No edema   Data Reviewed:    Labs: Basic Metabolic Panel:  Recent Labs Lab 08/11/15 1058 08/12/15 0542  NA 137 137  K 4.4 4.6  CL 101 100*  CO2 24 26  GLUCOSE 193* 257*  BUN 9 <5*  CREATININE 0.94 0.83  CALCIUM 9.1 8.7*   GFR Estimated Creatinine Clearance: 110.5 mL/min (by C-G formula based on Cr of 0.83). Liver Function Tests:  Recent Labs Lab 08/11/15 1058 08/12/15 0542  AST 15 8*  ALT 9* 8*  ALKPHOS 105 76  BILITOT 0.3 0.4  PROT 7.0 5.5*  ALBUMIN 3.0* 2.4*   No results for input(s): LIPASE, AMYLASE in the last 168 hours. No results for input(s): AMMONIA in the last 168 hours. Coagulation profile No results for input(s): INR, PROTIME in the last 168 hours.  CBC:  Recent Labs Lab 08/11/15 1058 08/12/15 0542    WBC 25.2* 18.2*  NEUTROABS 22.3*  --   HGB 12.2* 10.9*  HCT 36.6* 35.1*  MCV 87.4 88.9  PLT 386 325   Cardiac Enzymes: No results for input(s): CKTOTAL, CKMB, CKMBINDEX, TROPONINI in the last 168 hours. BNP (last 3 results) No results for input(s): PROBNP in the last 8760 hours. CBG:  Recent Labs Lab 08/11/15 1721 08/11/15 2258 08/12/15 0812  GLUCAP 92 212* 254*   D-Dimer: No results for input(s): DDIMER in the last 72 hours. Hgb A1c:  Recent Labs  08/11/15 1058  HGBA1C 10.5*   Lipid Profile: No results for input(s): CHOL, HDL, LDLCALC, TRIG, CHOLHDL, LDLDIRECT in the last 72 hours. Thyroid function studies: No results for input(s): TSH, T4TOTAL, T3FREE, THYROIDAB in the last 72 hours.  Invalid input(s): FREET3 Anemia work up: No results for input(s): VITAMINB12, FOLATE, FERRITIN, TIBC, IRON, RETICCTPCT in the last 72 hours. Sepsis Labs:  Recent Labs Lab 08/11/15 1058 08/11/15 1434 08/11/15 1710 08/12/15 0542  WBC 25.2*  --   --  18.2*  LATICACIDVEN  --  0.83 0.83  --    Microbiology Recent Results (from the past 240 hour(s))  Culture, blood (routine x 2)     Status: None   Collection Time: 08/02/15  6:30 PM  Result Value Ref Range Status   Specimen Description BLOOD RIGHT HAND  Final   Special Requests IN PEDIATRIC BOTTLE 3CC  Final   Culture NO GROWTH 5 DAYS  Final   Report Status 08/07/2015 FINAL  Final  Culture, blood (routine x 2)     Status: None   Collection Time: 08/02/15  6:35 PM  Result Value Ref Range Status   Specimen Description BLOOD LEFT ANTECUBITAL  Final   Special Requests BOTTLES DRAWN AEROBIC AND ANAEROBIC 5CC  Final   Culture NO GROWTH 5 DAYS  Final   Report Status 08/07/2015 FINAL  Final  Gram stain     Status: None   Collection Time: 08/11/15 11:53 AM  Result Value Ref Range Status   Specimen Description URINE, CLEAN CATCH  Final   Special Requests Normal  Final   Gram Stain   Final    ABUNDANT WBC PRESENT, PREDOMINANTLY  PMN ABUNDANT GRAM POSITIVE COCCI IN CLUSTERS    Report Status 08/11/2015 FINAL  Final     Medications:   . amitriptyline  75 mg Oral QHS  . atorvastatin  40 mg Oral q1800  . enoxaparin (LOVENOX) injection  40 mg Subcutaneous Q24H  . insulin aspart  0-15 Units Subcutaneous TID WC  . insulin aspart  0-5 Units Subcutaneous QHS  . metoprolol tartrate  25 mg Oral BID  . piperacillin-tazobactam (ZOSYN)  IV  3.375 g Intravenous 3 times per day  . sodium chloride flush  3 mL Intravenous Q12H   Continuous Infusions: . sodium chloride 100 mL/hr at 08/11/15 1722    Time spent: 25 min   LOS: 1 day   Marinda Elk  [facility] Pager [phone]  *Please refer to [email or web address], password [email or web address] to get updated schedule on who will round on this patient, as hospitalists switch teams weekly. If 7PM-7AM, please contact night-coverage at [email or web address], password [email or web address] for any overnight needs.  08/12/2015, 9:28 AM

## 2015-08-12 NOTE — Progress Notes (Signed)
Utilization review completed.  

## 2015-08-13 LAB — GLUCOSE, CAPILLARY
GLUCOSE-CAPILLARY: 288 mg/dL — AB (ref 65–99)
GLUCOSE-CAPILLARY: 236 mg/dL — AB (ref 65–99)
Glucose-Capillary: 380 mg/dL — ABNORMAL HIGH (ref 65–99)
Glucose-Capillary: 335 mg/dL — ABNORMAL HIGH (ref 65–99)

## 2015-08-13 MED ORDER — INSULIN DETEMIR 100 UNIT/ML ~~LOC~~ SOLN
20.0000 [IU] | Freq: Two times a day (BID) | SUBCUTANEOUS | Status: DC
  Administered 2015-08-13 – 2015-08-14 (×2): 20 [IU] via SUBCUTANEOUS
  Filled 2015-08-13 (×3): qty 0.2

## 2015-08-13 NOTE — Progress Notes (Signed)
  Subjective: Patient reports no change from yesterday. Minimal pain.   Objective: Vital signs in last 24 hours: Temp:  [98.1 F (36.7 C)-98.2 F (36.8 C)] 98.1 F (36.7 C) (02/19 0449) Pulse Rate:  [87-100] 87 (02/19 0449) Resp:  [17-18] 17 (02/19 0449) BP: (109-116)/(66-74) 111/71 mmHg (02/19 0449) SpO2:  [95 %-97 %] 95 % (02/19 0449)A  Intake/Output from previous day: 02/18 0701 - 02/19 0700 In: -  Out: 4200 [Urine:4200] Intake/Output this shift: Total I/O In: -  Out: 650 [Urine:650]  Past Medical History  Diagnosis Date  . Diabetes mellitus without complication [facility])     Physical Exam:  Lungs - Normal respiratory effort, chest expands symmetrically.  Abdomen - Soft, non-tender & non-distended.  Lab Results:  Recent Labs  08/11/15 1058 08/12/15 0542  WBC 25.2* 18.2*  HGB 12.2* 10.9*  HCT 36.6* 35.1*   BMET  Recent Labs  08/11/15 1058 08/12/15 0542  NA 137 137  K 4.4 4.6  CL 101 100*  CO2 24 26  GLUCOSE 193* 257*  BUN 9 <5*  CREATININE 0.94 0.83  CALCIUM 9.1 8.7*   No results for input(s): LABURIN in the last 72 hours. Results for orders placed or performed during the hospital encounter of 08/11/15  Gram stain     Status: None   Collection Time: 08/11/15 11:53 AM  Result Value Ref Range Status   Specimen Description URINE, CLEAN CATCH  Final   Special Requests Normal  Final   Gram Stain   Final    ABUNDANT WBC PRESENT, PREDOMINANTLY PMN ABUNDANT GRAM POSITIVE COCCI IN CLUSTERS    Report Status 08/11/2015 FINAL  Final  Culture, Urine     Status: None (Preliminary result)   Collection Time: 08/11/15 11:53 AM  Result Value Ref Range Status   Specimen Description URINE, CATHETERIZED  Final   Special Requests NONE  Final   Culture TOO YOUNG TO READ  Final   Report Status PENDING  Incomplete  Blood culture (routine x 2)     Status: None (Preliminary result)   Collection Time: 08/11/15  2:17 PM  Result Value Ref Range Status   Specimen  Description BLOOD LEFT ANTECUBITAL  Final   Special Requests BOTTLES DRAWN AEROBIC AND ANAEROBIC 5CC  Final   Culture NO GROWTH < 24 HOURS  Final   Report Status PENDING  Incomplete  Blood culture (routine x 2)     Status: None (Preliminary result)   Collection Time: 08/11/15  2:21 PM  Result Value Ref Range Status   Specimen Description BLOOD LEFT FOREARM  Final   Special Requests BOTTLES DRAWN AEROBIC AND ANAEROBIC 5CCS  Final   Culture NO GROWTH < 24 HOURS  Final   Report Status PENDING  Incomplete    Studies/Results: No results found.  Assessment: Possible prostate abscess, for  Repeat exam this week per Dr. Isabel Caprice.  Plan: Dr. Isabel Caprice to see next week after repeat scan.  Martita Brumm I Furkan Keenum 08/13/2015, 1:02 PM

## 2015-08-13 NOTE — Progress Notes (Addendum)
[facility] PROGRESS NOTE    Progress Note   Rickey Smith ZOX:[id number] DOB: 09/14/1965 DOA: 08-28-15 PCP: No PCP Per Patient   Brief Narrative:   Rickey Smith is an 50 y.o. male   Assessment/Plan:   Sepsis due Prostate abscess: Repeat CT scan show a large abscess, urology has schedule him for surgery on Wednesday. Cultures came back vancomycin continue Rocephin. Oral Flomax.  Tachycardia: Likely due to pain as he has hyperglycemia. Now Resolved.  Uncontrolled Diabetic neuropathy (HCC): A1c was 10.5. Increase long-acting insulin continue sliding scale.  DVT Prophylaxis - Lovenox ordered.  Family Communication: none Disposition Plan: Transfer to [facility] in the morning. Code Status:     Code Status Orders        Start     Ordered   2015/08/28 1512  Full code   Continuous     08-28-15 1513    Code Status History    Date Active Date Inactive Code Status Order ID Comments User Context   08/02/2015  7:12 PM 08/04/2015  4:43 PM Full Code [id number]  Zannie Cove, MD Inpatient   10/23/2014 12:32 PM 10/26/2014  6:31 PM Full Code [id number]  Marinda Elk, MD Inpatient   07/13/2014  9:28 AM 07/14/2014  5:05 PM Full Code [id number]  Jerald Kief, MD Inpatient   04/26/2014 12:51 PM 04/28/2014  7:04 PM Full Code [id number]  Rhetta Mura, MD Inpatient        IV Access:    Peripheral IV   Procedures and diagnostic studies:   Ct Renal Stone Study  08-28-15  CLINICAL DATA:  Bilateral flank pain for 1 week that has worsened. Difficulty urinating. History also mentions hematuria. EXAM: CT ABDOMEN AND PELVIS WITHOUT CONTRAST TECHNIQUE: Multidetector CT imaging of the abdomen and pelvis was performed following the standard protocol without IV contrast. COMPARISON:  None. FINDINGS: Lung bases: Minor dependent subsegmental atelectasis. Small nodules, 3 in right lower lobe and 1 peripheral right middle lobe, which is the largest measuring 5 mm. Heart is  normal in size. At liver, spleen, gallbladder, pancreas:  Normal. Adrenal glands: 2 cm left adrenal adenoma. Normal right adrenal gland. Kidneys, ureters, bladder: Normal kidneys in ureters. No stones or obstructive uropathy. Bladder is mostly decompressed with a Foley catheter. Wall may be mildly thickened. Reproductive: Prostate is enlarged and heterogeneous. There is a 3 x 2.9 x 2.4 cm irregular area of low attenuation within the right aspect the prostate suggesting an abscess. There is mild surrounding hazy inflammatory change and fat stranding extending around the seminal vesicles posterior bladder. No enlarged lymph nodes. Ascites:  None. Gastrointestinal: Stomach and small bowel are unremarkable. There is mild increased stool throughout the colon. No other colon abnormality. Normal appendix is visualized. There is some mild hazy opacity along posterior margin of the cecum consistent with inflammatory changes arising from the pelvis. Musculoskeletal: Degenerative changes of the lower lumbar spine. No osteoblastic or osteolytic lesions. IMPRESSION: 1. 3 cm irregular low-attenuation area within the prostate consistent with an abscess. There is surrounding inflammatory changes. The bladder appears walled, which is likely reactive, but could reflect infectious cystitis. 2. No other acute findings. 3. Normal kidneys in ureters. 4. Normal appendix. 5. Mild increased stool throughout the colon. Electronically Signed   By: Amie Portland M.D.   On: 2015/08/28 13:39     Medical Consultants:    None.  Anti-Infectives:   Anti-infectives    Start     Dose/Rate Route Frequency Ordered Stop  08/11/15 1645  piperacillin-tazobactam (ZOSYN) IVPB 3.375 g     3.375 g 12.5 mL/hr over 240 Minutes Intravenous 3 times per day 08/11/15 1642     08/11/15 1400  cefTRIAXone (ROCEPHIN) 2 g in dextrose 5 % 50 mL IVPB     2 g 100 mL/hr over 30 Minutes Intravenous  Once 08/11/15 1345 08/11/15 1613   08/11/15 1400   piperacillin-tazobactam (ZOSYN) IVPB 3.375 g     3.375 g 100 mL/hr over 30 Minutes Intravenous  Once 08/11/15 1345 08/11/15 1718      Subjective:    Rickey Smith pain is worse.  Objective:    Filed Vitals:   08/12/15 1025 08/12/15 1355 08/12/15 2101 08/13/15 0449  BP: 112/74 116/74 109/66 111/71  Pulse: 103 100 100 87  Temp:  98.2 F (36.8 C) 98.1 F (36.7 C) 98.1 F (36.7 C)  TempSrc:  Oral Oral Oral  Resp:  18 18 17   Height:      Weight:      SpO2:  97% 97% 95%    Intake/Output Summary (Last 24 hours) at 08/13/15 1324 Last data filed at 08/13/15 1051  Gross per 24 hour  Intake      0 ml  Output   3000 ml  Net  -3000 ml   Filed Weights   08/11/15 1952  Weight: 71.804 kg (158 lb 4.8 oz)    Exam: Gen:  NAD Cardiovascular:  RRR. Chest and lungs:   CTAB Abdomen:  Abdomen soft, NT/ND, + BS Extremities:  No edema   Data Reviewed:    Labs: Basic Metabolic Panel:  Recent Labs Lab 08/11/15 1058 08/12/15 0542  NA 137 137  K 4.4 4.6  CL 101 100*  CO2 24 26  GLUCOSE 193* 257*  BUN 9 <5*  CREATININE 0.94 0.83  CALCIUM 9.1 8.7*   GFR Estimated Creatinine Clearance: 110.5 mL/min (by C-G formula based on Cr of 0.83). Liver Function Tests:  Recent Labs Lab 08/11/15 1058 08/12/15 0542  AST 15 8*  ALT 9* 8*  ALKPHOS 105 76  BILITOT 0.3 0.4  PROT 7.0 5.5*  ALBUMIN 3.0* 2.4*   No results for input(s): LIPASE, AMYLASE in the last 168 hours. No results for input(s): AMMONIA in the last 168 hours. Coagulation profile No results for input(s): INR, PROTIME in the last 168 hours.  CBC:  Recent Labs Lab 08/11/15 1058 08/12/15 0542  WBC 25.2* 18.2*  NEUTROABS 22.3*  --   HGB 12.2* 10.9*  HCT 36.6* 35.1*  MCV 87.4 88.9  PLT 386 325   Cardiac Enzymes: No results for input(s): CKTOTAL, CKMB, CKMBINDEX, TROPONINI in the last 168 hours. BNP (last 3 results) No results for input(s): PROBNP in the last 8760 hours. CBG:  Recent Labs Lab  08/12/15 1203 08/12/15 1736 08/12/15 2059 08/13/15 0801 08/13/15 1159  GLUCAP 155* 298* 285* 288* 236*   D-Dimer: No results for input(s): DDIMER in the last 72 hours. Hgb A1c:  Recent Labs  08/11/15 1058  HGBA1C 10.5*   Lipid Profile: No results for input(s): CHOL, HDL, LDLCALC, TRIG, CHOLHDL, LDLDIRECT in the last 72 hours. Thyroid function studies: No results for input(s): TSH, T4TOTAL, T3FREE, THYROIDAB in the last 72 hours.  Invalid input(s): FREET3 Anemia work up: No results for input(s): VITAMINB12, FOLATE, FERRITIN, TIBC, IRON, RETICCTPCT in the last 72 hours. Sepsis Labs:  Recent Labs Lab 08/11/15 1058 08/11/15 1434 08/11/15 1710 08/12/15 0542  WBC 25.2*  --   --  18.2*  LATICACIDVEN  --  0.83 0.83  --    Microbiology Recent Results (from the past 240 hour(s))  Gram stain     Status: None   Collection Time: 08/11/15 11:53 AM  Result Value Ref Range Status   Specimen Description URINE, CLEAN CATCH  Final   Special Requests Normal  Final   Gram Stain   Final    ABUNDANT WBC PRESENT, PREDOMINANTLY PMN ABUNDANT GRAM POSITIVE COCCI IN CLUSTERS    Report Status 08/11/2015 FINAL  Final  Culture, Urine     Status: None (Preliminary result)   Collection Time: 08/11/15 11:53 AM  Result Value Ref Range Status   Specimen Description URINE, CATHETERIZED  Final   Special Requests NONE  Final   Culture TOO YOUNG TO READ  Final   Report Status PENDING  Incomplete  Blood culture (routine x 2)     Status: None (Preliminary result)   Collection Time: 08/11/15  2:17 PM  Result Value Ref Range Status   Specimen Description BLOOD LEFT ANTECUBITAL  Final   Special Requests BOTTLES DRAWN AEROBIC AND ANAEROBIC 5CC  Final   Culture NO GROWTH < 24 HOURS  Final   Report Status PENDING  Incomplete  Blood culture (routine x 2)     Status: None (Preliminary result)   Collection Time: 08/11/15  2:21 PM  Result Value Ref Range Status   Specimen Description BLOOD LEFT FOREARM   Final   Special Requests BOTTLES DRAWN AEROBIC AND ANAEROBIC 5CCS  Final   Culture NO GROWTH < 24 HOURS  Final   Report Status PENDING  Incomplete     Medications:   . amitriptyline  75 mg Oral QHS  . atorvastatin  40 mg Oral q1800  . enoxaparin (LOVENOX) injection  40 mg Subcutaneous Q24H  . insulin aspart  0-15 Units Subcutaneous TID WC  . insulin aspart  0-5 Units Subcutaneous QHS  . insulin detemir  5 Units Subcutaneous BID  . metoprolol tartrate  25 mg Oral BID  . piperacillin-tazobactam (ZOSYN)  IV  3.375 g Intravenous 3 times per day  . polyethylene glycol  17 g Oral BID  . sodium chloride flush  3 mL Intravenous Q12H  . tamsulosin  0.4 mg Oral QPC breakfast   Continuous Infusions:    Time spent: 25 min   LOS: 2 days   Marinda Elk  [facility] Pager [phone]  *Please refer to [email or web address], password [email or web address] to get updated schedule on who will round on this patient, as hospitalists switch teams weekly. If 7PM-7AM, please contact night-coverage at [email or web address], password [email or web address] for any overnight needs.  08/13/2015, 1:24 PM

## 2015-08-13 NOTE — Care Management Note (Addendum)
Case Management Note  Patient Details  Name: Rickey Smith MRN: [id number] Date of Birth: 05/24/1966  Subjective/Objective:        Admitted with  Sepsis due Prostate abscess. Lives alone. Independent with ADL's. DME: no assistive devices. PCP: none, CM to f/u with [facility].        Action/Plan:  08/15/15  Plan: CT scan shows a large abscess, urology has scheduled  surgery on 08/16/2015 @ [facility].  Return to home when medically stable. CM to f/u with disposition needs .  Expected Discharge Date:                  Expected Discharge Plan:  Home/Self Care  In-House Referral:  Financial counselor, pt without insurance  Discharge planning Services  CM Consult  Post Acute Care Choice:    Choice offered to:     DME Arranged:    DME Agency:     HH Arranged:    HH Agency:     Status of Service:  In process, will continue to follow  Medicare Important Message Given:    Date Medicare IM Given:    Medicare IM give by:    Date Additional Medicare IM Given:    Additional Medicare Important Message give by:     If discussed at Long Length of Stay Meetings, dates discussed:    Additional Comments: Rickey Smith (Mother)  [phone]  Gae Gallop [place], [place] [phone] 08/13/2015, 8:22 AM

## 2015-08-14 ENCOUNTER — Other Ambulatory Visit: Payer: Self-pay | Admitting: Urology

## 2015-08-14 ENCOUNTER — Encounter (HOSPITAL_COMMUNITY): Payer: Self-pay | Admitting: Radiology

## 2015-08-14 ENCOUNTER — Inpatient Hospital Stay (HOSPITAL_COMMUNITY): Payer: Self-pay

## 2015-08-14 DIAGNOSIS — A409 Streptococcal sepsis, unspecified: Secondary | ICD-10-CM

## 2015-08-14 LAB — GLUCOSE, CAPILLARY
GLUCOSE-CAPILLARY: 293 mg/dL — AB (ref 65–99)
GLUCOSE-CAPILLARY: 220 mg/dL — AB (ref 65–99)
Glucose-Capillary: 183 mg/dL — ABNORMAL HIGH (ref 65–99)
Glucose-Capillary: 249 mg/dL — ABNORMAL HIGH (ref 65–99)

## 2015-08-14 LAB — URINE CULTURE

## 2015-08-14 MED ORDER — DEXTROSE 5 % IV SOLN
1.0000 g | INTRAVENOUS | Status: DC
  Administered 2015-08-14 – 2015-08-15 (×2): 1 g via INTRAVENOUS
  Filled 2015-08-14 (×3): qty 10

## 2015-08-14 MED ORDER — INSULIN DETEMIR 100 UNIT/ML ~~LOC~~ SOLN
25.0000 [IU] | Freq: Two times a day (BID) | SUBCUTANEOUS | Status: DC
  Administered 2015-08-14 – 2015-08-15 (×2): 25 [IU] via SUBCUTANEOUS
  Filled 2015-08-14 (×3): qty 0.25

## 2015-08-14 MED ORDER — SODIUM CHLORIDE 0.9 % IV SOLN
1500.0000 mg | INTRAVENOUS | Status: AC
  Administered 2015-08-14: 1500 mg via INTRAVENOUS
  Filled 2015-08-14: qty 1500

## 2015-08-14 MED ORDER — VANCOMYCIN HCL IN DEXTROSE 750-5 MG/150ML-% IV SOLN
750.0000 mg | Freq: Three times a day (TID) | INTRAVENOUS | Status: AC
  Administered 2015-08-14 – 2015-08-16 (×6): 750 mg via INTRAVENOUS
  Filled 2015-08-14 (×8): qty 150

## 2015-08-14 MED ORDER — HYDROMORPHONE HCL 1 MG/ML IJ SOLN
2.0000 mg | INTRAMUSCULAR | Status: DC | PRN
  Administered 2015-08-14 – 2015-08-17 (×14): 2 mg via INTRAVENOUS
  Filled 2015-08-14 (×15): qty 2

## 2015-08-14 MED ORDER — IOHEXOL 300 MG/ML  SOLN
100.0000 mL | Freq: Once | INTRAMUSCULAR | Status: AC | PRN
Start: 2015-08-14 — End: 2015-08-14
  Administered 2015-08-14: 100 mL via INTRAVENOUS

## 2015-08-14 NOTE — Progress Notes (Signed)
Pharmacy Antibiotic Note  Rickey Smith is a 50 y.o. male admitted on 08/11/2015 with UTI and prostate abscess.  Pharmacy has been consulted for vancomycin dosing. Urine reveals MRSA. Urology is consulted.  Plan: Vancomycin 1500 mg IV now Vancomycin 750 mg IV every 8 hours.  Goal trough 15-20 mcg/mL.  Discontinue Zosyn Monitor renal function, trough at steady state  Height:  (185.4 cm) Weight: 158 lb 4.8 oz (71.804 kg) IBW/kg (Calculated) : 79.9  Temp (24hrs), Avg:98.2 F (36.8 C), Min:98.1 F (36.7 C), Max:98.2 F (36.8 C)   Recent Labs Lab 08/11/15 1058 08/11/15 1434 08/11/15 1710 08/12/15 0542  WBC 25.2*  --   --  18.2*  CREATININE 0.94  --   --  0.83  LATICACIDVEN  --  0.83 0.83  --     Estimated Creatinine Clearance: 110.5 mL/min (by C-G formula based on Cr of 0.83).    Allergies  Allergen Reactions  . Codeine Itching  . Gabapentin Diarrhea and Nausea And Vomiting    Antimicrobials this admission: Ceftriaxone x1 on 2/17 Zosyn 2/17>>2/20 Vanc 2/20>>  Dose adjustments this admission: none  Microbiology results: 2/17 BCx2: ngtd 2/17 UCx: MRSA, vanc MIC 1 2/8 BCx2: NEG   Thank you for allowing pharmacy to be a part of this patient's care.  [place], [address].D., BCPS Clinical Pharmacist Pager: [phone] 08/14/2015 11:33 AM

## 2015-08-14 NOTE — Progress Notes (Signed)
Subjective: Patient reports some increased perineal discomfort. No chills. He has remained afebrile. He continues to have an indwelling Foley catheter. Repeat CT findings were reviewed with the patient. He has a enlarging and more well-defined prostatic abscess.  Objective: Vital signs in last 24 hours: Temp:  [98.1 F (36.7 C)-98.2 F (36.8 C)] 98.1 F (36.7 C) (02/20 1259) Pulse Rate:  [84-101] 86 (02/20 1259) Resp:  [16-18] 16 (02/20 1259) BP: (103-134)/(59-83) 134/83 mmHg (02/20 1259) SpO2:  [97 %-98 %] 98 % (02/20 1259)  Intake/Output from previous day: 02/19 0701 - 02/20 0700 In: -  Out: 2525 [Urine:2525] Intake/Output this shift: Total I/O In: -  Out: 800 [Urine:800]  Physical Exam:  Constitutional: Vital signs reviewed. WD WN in NAD   Eyes: PERRL, No scleral icterus.   Cardiovascular: RRR Pulmonary/Chest: Normal effort Abdominal: Soft. Non-tender, non-distended, bowel sounds are normal, no masses, organomegaly, or guarding present.  Genitourinary: Indwelling Foley catheter. Extremities: No cyanosis or edema   Lab Results:  Recent Labs  08/12/15 0542  HGB 10.9*  HCT 35.1*   BMET  Recent Labs  08/12/15 0542  NA 137  K 4.6  CL 100*  CO2 26  GLUCOSE 257*  BUN <5*  CREATININE 0.83  CALCIUM 8.7*   No results for input(s): LABPT, INR in the last 72 hours. No results for input(s): LABURIN in the last 72 hours. Results for orders placed or performed during the hospital encounter of 08/11/15  Gram stain     Status: None   Collection Time: 08/11/15 11:53 AM  Result Value Ref Range Status   Specimen Description URINE, CLEAN CATCH  Final   Special Requests Normal  Final   Gram Stain   Final    ABUNDANT WBC PRESENT, PREDOMINANTLY PMN ABUNDANT GRAM POSITIVE COCCI IN CLUSTERS    Report Status 08/11/2015 FINAL  Final  Culture, Urine     Status: None   Collection Time: 08/11/15 11:53 AM  Result Value Ref Range Status   Specimen Description URINE,  CATHETERIZED  Final   Special Requests NONE  Final   Culture   Final    >=100,000 COLONIES/mL METHICILLIN RESISTANT STAPHYLOCOCCUS AUREUS   Report Status 08/14/2015 FINAL  Final   Organism ID, Bacteria METHICILLIN RESISTANT STAPHYLOCOCCUS AUREUS  Final      Susceptibility   Methicillin resistant staphylococcus aureus - MIC*    CIPROFLOXACIN <=0.5 SENSITIVE Sensitive     GENTAMICIN <=0.5 SENSITIVE Sensitive     NITROFURANTOIN <=16 SENSITIVE Sensitive     OXACILLIN >=4 RESISTANT Resistant     TETRACYCLINE <=1 SENSITIVE Sensitive     VANCOMYCIN 1 SENSITIVE Sensitive     TRIMETH/SULFA <=10 SENSITIVE Sensitive     CLINDAMYCIN <=0.25 SENSITIVE Sensitive     RIFAMPIN <=0.5 SENSITIVE Sensitive     Inducible Clindamycin NEGATIVE Sensitive     * >=100,000 COLONIES/mL METHICILLIN RESISTANT STAPHYLOCOCCUS AUREUS  Blood culture (routine x 2)     Status: None (Preliminary result)   Collection Time: 08/11/15  2:17 PM  Result Value Ref Range Status   Specimen Description BLOOD LEFT ANTECUBITAL  Final   Special Requests BOTTLES DRAWN AEROBIC AND ANAEROBIC 5CC  Final   Culture NO GROWTH 3 DAYS  Final   Report Status PENDING  Incomplete  Blood culture (routine x 2)     Status: None (Preliminary result)   Collection Time: 08/11/15  2:21 PM  Result Value Ref Range Status   Specimen Description BLOOD LEFT FOREARM  Final   Special Requests  BOTTLES DRAWN AEROBIC AND ANAEROBIC 5CCS  Final   Culture NO GROWTH 3 DAYS  Final   Report Status PENDING  Incomplete    Studies/Results: Ct Abdomen Pelvis W Contrast  08/14/2015  CLINICAL DATA:  Pelvic pain for the last 2 weeks.  Prostate abscess. EXAM: CT ABDOMEN AND PELVIS WITH CONTRAST TECHNIQUE: Multidetector CT imaging of the abdomen and pelvis was performed using the standard protocol following bolus administration of intravenous contrast. CONTRAST:  OMNIPAQUE IOHEXOL 300 MG/ML  SOLN COMPARISON:  08/11/2015 FINDINGS: Dependent atelectasis in the lung  bases bilaterally. No pleural effusions. Heart is normal size. Liver, gallbladder, spleen, pancreas, adrenals and kidneys are normal. Fluid collection in the prostate is better defined with enhancing walls and has enlarged, measuring 4.3 x 2.8 cm. This extends to and possibly through the posterior wall of the prostate. Foley catheter is present in the bladder which is decompressed. Bowel grossly unremarkable. No free fluid, free air, or adenopathy. Aorta is normal caliber. No acute bony abnormality or focal bone lesion. IMPRESSION: Enlargement and better definition of the prostate abscess as described above. This extends to and possibly through the posterior aspect of the prostate. Bibasilar atelectasis. Electronically Signed   By: Charlett Nose M.D.   On: 08/14/2015 09:00    Assessment/Plan:   MRSA UTI/prostatitis with prostatic abscess. Patient should undergo endoscopic unroofing of the abscess to assure drainage of this abscess cavity. He is currently on the schedule for Wednesday morning to undergo TUR resection of the wall the prostate and unroofing of the abscess. This procedure was explained to the patient. We discussed the rationale as well as potential complications and expected recovery. He will be transferred [facility] and will continue with his care and postoperative recovery and that institution.   LOS: 3 days   Ceaira Ernster S 08/14/2015, 5:24 PM

## 2015-08-14 NOTE — Progress Notes (Signed)
Chart reviewed as well as recent repeat CT scan.  Prostate abscess is larger and more clearly defined.  He will require a transurethral unroofing of this to assure adequate drainage.  I am hoping to get this on the operative schedule for Wednesday.  He will need to be [facility] to [facility].  Postoperatively he can be transported back to [facility] or potentially can be transferred to the hospital service at [facility].  I will be by later today to discuss with the patient.

## 2015-08-14 NOTE — Hospital Discharge Follow-Up (Signed)
The patient is known to the [facility] ([facility]) at the [facility].  Met with the patient today and he is agreeable to continue  follow up at the [facility].  Transportation to his appointments is not a problem.  Will schedule a [facility] appointment when the discharge date is determined  Update provided to Whitman Hero, RN CM.  Will continue to follow his hospitalization.

## 2015-08-15 DIAGNOSIS — K219 Gastro-esophageal reflux disease without esophagitis: Secondary | ICD-10-CM

## 2015-08-15 LAB — GLUCOSE, CAPILLARY
GLUCOSE-CAPILLARY: 265 mg/dL — AB (ref 65–99)
GLUCOSE-CAPILLARY: 110 mg/dL — AB (ref 65–99)
GLUCOSE-CAPILLARY: 186 mg/dL — AB (ref 65–99)
Glucose-Capillary: 220 mg/dL — ABNORMAL HIGH (ref 65–99)
Glucose-Capillary: 195 mg/dL — ABNORMAL HIGH (ref 65–99)

## 2015-08-15 MED ORDER — INSULIN DETEMIR 100 UNIT/ML ~~LOC~~ SOLN
30.0000 [IU] | Freq: Two times a day (BID) | SUBCUTANEOUS | Status: DC
  Administered 2015-08-15 – 2015-08-16 (×2): 30 [IU] via SUBCUTANEOUS
  Filled 2015-08-15 (×6): qty 0.3

## 2015-08-15 NOTE — Progress Notes (Signed)
Report given to Shanda Bumps, RN in [place]. Pt will be admitted to [address] 1402.

## 2015-08-15 NOTE — Progress Notes (Signed)
Pt is transferred to [facility] via [employer] [facility]. No s/s of distress noted during transport.

## 2015-08-15 NOTE — Progress Notes (Signed)
Attempted to give report to [facility]. Nurse to call back.

## 2015-08-15 NOTE — Progress Notes (Signed)
[facility] PROGRESS NOTE    Progress Note   BRAXEN DOBEK VOZ:[id number] DOB: 1966/01/22 DOA: 08/11/2015 PCP: No PCP Per Patient   Brief Narrative:   WALLIS VANCOTT is an 50 y.o. male   Assessment/Plan:   Sepsis due Prostate abscess: Repeat CT scan show a large abscess, urology has schedule him for surgery on 08/16/2015. Cultures came back vancomycin continue Rocephin. Transfer to [facility]. Pain control.  Tachycardia: Likely due to pain as he has hyperglycemia. Now Resolved.  Uncontrolled Diabetic neuropathy (HCC): A1c was 10.5. Increase long-acting insulin again,  continue sliding scale.  DVT Prophylaxis - Lovenox ordered.  Family Communication: none Disposition Plan: Transfer to [facility] in the morning. Code Status:     Code Status Orders        Start     Ordered   08/11/15 1512  Full code   Continuous     08/11/15 1513    Code Status History    Date Active Date Inactive Code Status Order ID Comments User Context   08/02/2015  7:12 PM 08/04/2015  4:43 PM Full Code [id number]  Zannie Cove, MD Inpatient   10/23/2014 12:32 PM 10/26/2014  6:31 PM Full Code [id number]  Marinda Elk, MD Inpatient   07/13/2014  9:28 AM 07/14/2014  5:05 PM Full Code [id number]  Jerald Kief, MD Inpatient   04/26/2014 12:51 PM 04/28/2014  7:04 PM Full Code [id number]  Rhetta Mura, MD Inpatient        IV Access:    Peripheral IV   Procedures and diagnostic studies:   Ct Abdomen Pelvis W Contrast  08/14/2015  CLINICAL DATA:  Pelvic pain for the last 2 weeks.  Prostate abscess. EXAM: CT ABDOMEN AND PELVIS WITH CONTRAST TECHNIQUE: Multidetector CT imaging of the abdomen and pelvis was performed using the standard protocol following bolus administration of intravenous contrast. CONTRAST:  OMNIPAQUE IOHEXOL 300 MG/ML  SOLN COMPARISON:  08/11/2015 FINDINGS: Dependent atelectasis in the lung bases bilaterally. No pleural effusions. Heart is  normal size. Liver, gallbladder, spleen, pancreas, adrenals and kidneys are normal. Fluid collection in the prostate is better defined with enhancing walls and has enlarged, measuring 4.3 x 2.8 cm. This extends to and possibly through the posterior wall of the prostate. Foley catheter is present in the bladder which is decompressed. Bowel grossly unremarkable. No free fluid, free air, or adenopathy. Aorta is normal caliber. No acute bony abnormality or focal bone lesion. IMPRESSION: Enlargement and better definition of the prostate abscess as described above. This extends to and possibly through the posterior aspect of the prostate. Bibasilar atelectasis. Electronically Signed   By: Charlett Nose M.D.   On: 08/14/2015 09:00     Medical Consultants:    None.  Anti-Infectives:   Anti-infectives    Start     Dose/Rate Route Frequency Ordered Stop   08/14/15 1800  vancomycin (VANCOCIN) IVPB 750 mg/150 ml premix     750 mg 150 mL/hr over 60 Minutes Intravenous Every 8 hours 08/14/15 1200     08/14/15 1230  cefTRIAXone (ROCEPHIN) 1 g in dextrose 5 % 50 mL IVPB     1 g 100 mL/hr over 30 Minutes Intravenous Every 24 hours 08/14/15 1217     08/14/15 0930  vancomycin (VANCOCIN) 1,500 mg in sodium chloride 0.9 % 500 mL IVPB     1,500 mg 250 mL/hr over 120 Minutes Intravenous NOW 08/14/15 0924 08/14/15 1229   08/11/15 1645  piperacillin-tazobactam (ZOSYN) IVPB  3.375 g  Status:  Discontinued     3.375 g 12.5 mL/hr over 240 Minutes Intravenous 3 times per day 08/11/15 1642 08/14/15 1134   08/11/15 1400  cefTRIAXone (ROCEPHIN) 2 g in dextrose 5 % 50 mL IVPB     2 g 100 mL/hr over 30 Minutes Intravenous  Once 08/11/15 1345 08/11/15 1613   08/11/15 1400  piperacillin-tazobactam (ZOSYN) IVPB 3.375 g     3.375 g 100 mL/hr over 30 Minutes Intravenous  Once 08/11/15 1345 08/11/15 1718      Subjective:    Vilinda Blanks Hartsell pain is worse.  Objective:    Filed Vitals:   08/14/15 0929 08/14/15 1259  08/14/15 2123 08/15/15 0512  BP: 112/72 134/83 106/59 132/86  Pulse: 101 86 98 87  Temp:  98.1 F (36.7 C) 98 F (36.7 C) 97.5 F (36.4 C)  TempSrc:  Oral Oral Oral  Resp:  16 18 16   Height:      Weight:      SpO2:  98% 97% 100%    Intake/Output Summary (Last 24 hours) at 08/15/15 1126 Last data filed at 08/15/15 0510  Gross per 24 hour  Intake      0 ml  Output   2575 ml  Net  -2575 ml   Filed Weights   08/11/15 1952  Weight: 71.804 kg (158 lb 4.8 oz)    Exam: Gen:  NAD Cardiovascular:  RRR. Chest and lungs:   CTAB Abdomen:  Abdomen soft, NT/ND, + BS Extremities:  No edema   Data Reviewed:    Labs: Basic Metabolic Panel:  Recent Labs Lab 08/11/15 1058 08/12/15 0542  NA 137 137  K 4.4 4.6  CL 101 100*  CO2 24 26  GLUCOSE 193* 257*  BUN 9 <5*  CREATININE 0.94 0.83  CALCIUM 9.1 8.7*   GFR Estimated Creatinine Clearance: 110.5 mL/min (by C-G formula based on Cr of 0.83). Liver Function Tests:  Recent Labs Lab 08/11/15 1058 08/12/15 0542  AST 15 8*  ALT 9* 8*  ALKPHOS 105 76  BILITOT 0.3 0.4  PROT 7.0 5.5*  ALBUMIN 3.0* 2.4*   No results for input(s): LIPASE, AMYLASE in the last 168 hours. No results for input(s): AMMONIA in the last 168 hours. Coagulation profile No results for input(s): INR, PROTIME in the last 168 hours.  CBC:  Recent Labs Lab 08/11/15 1058 08/12/15 0542  WBC 25.2* 18.2*  NEUTROABS 22.3*  --   HGB 12.2* 10.9*  HCT 36.6* 35.1*  MCV 87.4 88.9  PLT 386 325   Cardiac Enzymes: No results for input(s): CKTOTAL, CKMB, CKMBINDEX, TROPONINI in the last 168 hours. BNP (last 3 results) No results for input(s): PROBNP in the last 8760 hours. CBG:  Recent Labs Lab 08/14/15 0823 08/14/15 1204 08/14/15 1719 08/14/15 2116 08/15/15 0759  GLUCAP 183* 293* 220* 249* 220*   D-Dimer: No results for input(s): DDIMER in the last 72 hours. Hgb A1c: No results for input(s): HGBA1C in the last 72 hours. Lipid Profile: No  results for input(s): CHOL, HDL, LDLCALC, TRIG, CHOLHDL, LDLDIRECT in the last 72 hours. Thyroid function studies: No results for input(s): TSH, T4TOTAL, T3FREE, THYROIDAB in the last 72 hours.  Invalid input(s): FREET3 Anemia work up: No results for input(s): VITAMINB12, FOLATE, FERRITIN, TIBC, IRON, RETICCTPCT in the last 72 hours. Sepsis Labs:  Recent Labs Lab 08/11/15 1058 08/11/15 1434 08/11/15 1710 08/12/15 0542  WBC 25.2*  --   --  18.2*  LATICACIDVEN  --  0.83 0.83  --    Microbiology Recent Results (from the past 240 hour(s))  Gram stain     Status: None   Collection Time: 08/11/15 11:53 AM  Result Value Ref Range Status   Specimen Description URINE, CLEAN CATCH  Final   Special Requests Normal  Final   Gram Stain   Final    ABUNDANT WBC PRESENT, PREDOMINANTLY PMN ABUNDANT GRAM POSITIVE COCCI IN CLUSTERS    Report Status 08/11/2015 FINAL  Final  Culture, Urine     Status: None   Collection Time: 08/11/15 11:53 AM  Result Value Ref Range Status   Specimen Description URINE, CATHETERIZED  Final   Special Requests NONE  Final   Culture   Final    >=100,000 COLONIES/mL METHICILLIN RESISTANT STAPHYLOCOCCUS AUREUS   Report Status 08/14/2015 FINAL  Final   Organism ID, Bacteria METHICILLIN RESISTANT STAPHYLOCOCCUS AUREUS  Final      Susceptibility   Methicillin resistant staphylococcus aureus - MIC*    CIPROFLOXACIN <=0.5 SENSITIVE Sensitive     GENTAMICIN <=0.5 SENSITIVE Sensitive     NITROFURANTOIN <=16 SENSITIVE Sensitive     OXACILLIN >=4 RESISTANT Resistant     TETRACYCLINE <=1 SENSITIVE Sensitive     VANCOMYCIN 1 SENSITIVE Sensitive     TRIMETH/SULFA <=10 SENSITIVE Sensitive     CLINDAMYCIN <=0.25 SENSITIVE Sensitive     RIFAMPIN <=0.5 SENSITIVE Sensitive     Inducible Clindamycin NEGATIVE Sensitive     * >=100,000 COLONIES/mL METHICILLIN RESISTANT STAPHYLOCOCCUS AUREUS  Blood culture (routine x 2)     Status: None (Preliminary result)   Collection Time:  08/11/15  2:17 PM  Result Value Ref Range Status   Specimen Description BLOOD LEFT ANTECUBITAL  Final   Special Requests BOTTLES DRAWN AEROBIC AND ANAEROBIC 5CC  Final   Culture NO GROWTH 3 DAYS  Final   Report Status PENDING  Incomplete  Blood culture (routine x 2)     Status: None (Preliminary result)   Collection Time: 08/11/15  2:21 PM  Result Value Ref Range Status   Specimen Description BLOOD LEFT FOREARM  Final   Special Requests BOTTLES DRAWN AEROBIC AND ANAEROBIC 5CCS  Final   Culture NO GROWTH 3 DAYS  Final   Report Status PENDING  Incomplete     Medications:   . amitriptyline  75 mg Oral QHS  . atorvastatin  40 mg Oral q1800  . cefTRIAXone (ROCEPHIN)  IV  1 g Intravenous Q24H  . enoxaparin (LOVENOX) injection  40 mg Subcutaneous Q24H  . insulin aspart  0-15 Units Subcutaneous TID WC  . insulin aspart  0-5 Units Subcutaneous QHS  . insulin detemir  25 Units Subcutaneous BID  . metoprolol tartrate  25 mg Oral BID  . polyethylene glycol  17 g Oral BID  . sodium chloride flush  3 mL Intravenous Q12H  . tamsulosin  0.4 mg Oral QPC breakfast  . vancomycin  750 mg Intravenous Q8H   Continuous Infusions:    Time spent: 15 min   LOS: 4 days   Marinda Elk  [facility] Pager [phone]  *Please refer to [email or web address], password [email or web address] to get updated schedule on who will round on this patient, as hospitalists switch teams weekly. If 7PM-7AM, please contact night-coverage at [email or web address], password [email or web address] for any overnight needs.  08/15/2015, 11:26 AM

## 2015-08-15 NOTE — Progress Notes (Signed)
UR COMPLETED  

## 2015-08-16 ENCOUNTER — Encounter (HOSPITAL_COMMUNITY): Payer: Self-pay

## 2015-08-16 ENCOUNTER — Encounter (HOSPITAL_COMMUNITY)
Admission: EM | Disposition: A | Discharge status: Home Health | Payer: Self-pay | Source: Home / Self Care | Attending: Internal Medicine

## 2015-08-16 ENCOUNTER — Inpatient Hospital Stay (HOSPITAL_COMMUNITY): Payer: Self-pay | Admitting: Certified Registered"

## 2015-08-16 ENCOUNTER — Telehealth: Payer: Self-pay

## 2015-08-16 DIAGNOSIS — N390 Urinary tract infection, site not specified: Secondary | ICD-10-CM

## 2015-08-16 DIAGNOSIS — E1149 Type 2 diabetes mellitus with other diabetic neurological complication: Secondary | ICD-10-CM

## 2015-08-16 DIAGNOSIS — N412 Abscess of prostate: Secondary | ICD-10-CM

## 2015-08-16 DIAGNOSIS — D72829 Elevated white blood cell count, unspecified: Secondary | ICD-10-CM

## 2015-08-16 HISTORY — PX: TRANSURETHRAL RESECTION OF PROSTATE: SHX73

## 2015-08-16 LAB — GLUCOSE, CAPILLARY
GLUCOSE-CAPILLARY: 164 mg/dL — AB (ref 65–99)
Glucose-Capillary: 204 mg/dL — ABNORMAL HIGH (ref 65–99)
Glucose-Capillary: 153 mg/dL — ABNORMAL HIGH (ref 65–99)
Glucose-Capillary: 144 mg/dL — ABNORMAL HIGH (ref 65–99)
Glucose-Capillary: 461 mg/dL — ABNORMAL HIGH (ref 65–99)
Glucose-Capillary: 57 mg/dL — ABNORMAL LOW (ref 65–99)
Glucose-Capillary: 80 mg/dL (ref 65–99)

## 2015-08-16 LAB — BASIC METABOLIC PANEL
ANION GAP: 9 (ref 5–15)
BUN: 10 mg/dL (ref 6–20)
CALCIUM: 8.7 mg/dL — AB (ref 8.9–10.3)
CO2: 28 mmol/L (ref 22–32)
CREATININE: 0.84 mg/dL (ref 0.61–1.24)
Chloride: 99 mmol/L — ABNORMAL LOW (ref 101–111)
GFR calc Af Amer: >60 mL/min (ref >60)
GLUCOSE: 154 mg/dL — AB (ref 65–99)
POTASSIUM: 4.5 mmol/L (ref 3.5–5.1)
Sodium: 136 mmol/L (ref 135–145)

## 2015-08-16 LAB — CULTURE, BLOOD (ROUTINE X 2)
CULTURE: NO GROWTH
CULTURE: NO GROWTH

## 2015-08-16 LAB — SURGICAL PCR SCREEN
MRSA, PCR: NEGATIVE
Staphylococcus aureus: NEGATIVE

## 2015-08-16 LAB — INSULIN FREE AND TOTAL
INSULIN, FREE: 15.6 u[IU]/mL — AB (ref 1.5–14.9)
Insulin: 25.1 u[IU]/mL — ABNORMAL HIGH (ref 2.0–19.6)

## 2015-08-16 LAB — VANCOMYCIN, TROUGH: Vancomycin Tr: 11 ug/mL (ref 10.0–20.0)

## 2015-08-16 SURGERY — TURP (TRANSURETHRAL RESECTION OF PROSTATE)
Anesthesia: General

## 2015-08-16 MED ORDER — LIDOCAINE HCL (CARDIAC) 20 MG/ML IV SOLN
INTRAVENOUS | Status: AC
  Filled 2015-08-16: qty 5

## 2015-08-16 MED ORDER — LACTATED RINGERS IV SOLN
INTRAVENOUS | Status: DC
  Administered 2015-08-16: 12:00:00 via INTRAVENOUS

## 2015-08-16 MED ORDER — HYDROMORPHONE HCL 2 MG/ML IJ SOLN
INTRAMUSCULAR | Status: AC
  Filled 2015-08-16: qty 1

## 2015-08-16 MED ORDER — PROPOFOL 10 MG/ML IV BOLUS
INTRAVENOUS | Status: AC
  Filled 2015-08-16: qty 20

## 2015-08-16 MED ORDER — FENTANYL CITRATE (PF) 100 MCG/2ML IJ SOLN
INTRAMUSCULAR | Status: DC | PRN
  Administered 2015-08-16 (×2): 50 ug via INTRAVENOUS

## 2015-08-16 MED ORDER — HYDROMORPHONE HCL 1 MG/ML IJ SOLN
INTRAMUSCULAR | Status: DC | PRN
  Administered 2015-08-16 (×2): 1 mg via INTRAVENOUS

## 2015-08-16 MED ORDER — ONDANSETRON HCL 4 MG/2ML IJ SOLN
INTRAMUSCULAR | Status: DC | PRN
  Administered 2015-08-16: 4 mg via INTRAVENOUS

## 2015-08-16 MED ORDER — MIDAZOLAM HCL 5 MG/5ML IJ SOLN
INTRAMUSCULAR | Status: DC | PRN
  Administered 2015-08-16: 2 mg via INTRAVENOUS

## 2015-08-16 MED ORDER — PHENYLEPHRINE HCL 10 MG/ML IJ SOLN
INTRAMUSCULAR | Status: DC | PRN
  Administered 2015-08-16 (×2): 80 ug via INTRAVENOUS

## 2015-08-16 MED ORDER — VANCOMYCIN HCL IN DEXTROSE 1-5 GM/200ML-% IV SOLN
1000.0000 mg | Freq: Three times a day (TID) | INTRAVENOUS | Status: DC
  Administered 2015-08-16 – 2015-08-17 (×2): 1000 mg via INTRAVENOUS
  Filled 2015-08-16 (×3): qty 200

## 2015-08-16 MED ORDER — MIDAZOLAM HCL 2 MG/2ML IJ SOLN
INTRAMUSCULAR | Status: AC
  Filled 2015-08-16: qty 2

## 2015-08-16 MED ORDER — LIDOCAINE HCL 2 % EX GEL
CUTANEOUS | Status: AC
  Filled 2015-08-16: qty 5

## 2015-08-16 MED ORDER — PHENYLEPHRINE 40 MCG/ML (10ML) SYRINGE FOR IV PUSH (FOR BLOOD PRESSURE SUPPORT)
PREFILLED_SYRINGE | INTRAVENOUS | Status: AC
  Filled 2015-08-16: qty 10

## 2015-08-16 MED ORDER — LIDOCAINE HCL (CARDIAC) 20 MG/ML IV SOLN
INTRAVENOUS | Status: DC | PRN
  Administered 2015-08-16: 50 mg via INTRAVENOUS

## 2015-08-16 MED ORDER — BELLADONNA ALKALOIDS-OPIUM 16.2-60 MG RE SUPP
RECTAL | Status: AC
  Filled 2015-08-16: qty 1

## 2015-08-16 MED ORDER — FENTANYL CITRATE (PF) 100 MCG/2ML IJ SOLN
INTRAMUSCULAR | Status: AC
  Filled 2015-08-16: qty 2

## 2015-08-16 MED ORDER — SODIUM CHLORIDE 0.9 % IR SOLN
Status: DC | PRN
  Administered 2015-08-16: 9000 mL

## 2015-08-16 MED ORDER — HYDROMORPHONE HCL 1 MG/ML IJ SOLN
INTRAMUSCULAR | Status: AC
  Filled 2015-08-16: qty 1

## 2015-08-16 MED ORDER — LIDOCAINE HCL 2 % EX GEL
CUTANEOUS | Status: DC | PRN
  Administered 2015-08-16: 1

## 2015-08-16 MED ORDER — PROPOFOL 10 MG/ML IV BOLUS
INTRAVENOUS | Status: DC | PRN
  Administered 2015-08-16: 180 mg via INTRAVENOUS

## 2015-08-16 MED ORDER — LACTATED RINGERS IV SOLN
INTRAVENOUS | Status: DC | PRN
  Administered 2015-08-16: 10:00:00 via INTRAVENOUS

## 2015-08-16 MED ORDER — HYDROMORPHONE HCL 1 MG/ML IJ SOLN
0.2500 mg | INTRAMUSCULAR | Status: DC | PRN
  Administered 2015-08-16 (×2): 0.5 mg via INTRAVENOUS

## 2015-08-16 MED ORDER — ONDANSETRON HCL 4 MG/2ML IJ SOLN
INTRAMUSCULAR | Status: AC
  Filled 2015-08-16: qty 2

## 2015-08-16 MED ORDER — DEXTROSE 5 % IV SOLN
2.0000 g | INTRAVENOUS | Status: DC
  Administered 2015-08-16: 2 g via INTRAVENOUS
  Filled 2015-08-16: qty 2

## 2015-08-16 SURGICAL SUPPLY — 24 items
BAG URINE DRAINAGE (UROLOGICAL SUPPLIES) ×2 IMPLANT
BAG URO CATCHER STRL LF (MISCELLANEOUS) ×3 IMPLANT
BLADE SURG 15 STRL LF DISP TIS (BLADE) IMPLANT
BLADE SURG 15 STRL SS (BLADE)
CATH FOLEY 2WAY SLVR  5CC 20FR (CATHETERS) ×2
CATH FOLEY 2WAY SLVR 5CC 20FR (CATHETERS) ×1 IMPLANT
CATH FOLEY 3WAY 30CC 24FR (CATHETERS)
CATH URTH STD 24FR FL 3W 2 (CATHETERS) IMPLANT
ELECT REM PT RETURN 9FT ADLT (ELECTROSURGICAL) ×3
ELECTRODE REM PT RTRN 9FT ADLT (ELECTROSURGICAL) ×1 IMPLANT
GLOVE BIOGEL M STRL SZ7.5 (GLOVE) ×3 IMPLANT
GOWN SRG XL XLNG 56XLVL 4 (GOWN DISPOSABLE) ×1 IMPLANT
GOWN STRL NON-REIN XL XLG LVL4 (GOWN DISPOSABLE)
HOLDER FOLEY CATH W/STRAP (MISCELLANEOUS) ×2 IMPLANT
KIT ASPIRATION TUBING (SET/KITS/TRAYS/PACK) ×3 IMPLANT
LOOP CUT BIPOLAR 24F LRG (ELECTROSURGICAL) ×2 IMPLANT
MANIFOLD NEPTUNE II (INSTRUMENTS) ×3 IMPLANT
NS IRRIG 1000ML POUR BTL (IV SOLUTION) ×3 IMPLANT
PACK CYSTO (CUSTOM PROCEDURE TRAY) ×3 IMPLANT
SUT ETHILON 3 0 PS 1 (SUTURE) IMPLANT
SYR 30ML LL (SYRINGE) ×2 IMPLANT
SYRINGE IRR TOOMEY STRL 70CC (SYRINGE) ×3 IMPLANT
TUBING CONNECTING 10 (TUBING) ×2 IMPLANT
TUBING CONNECTING 10' (TUBING) ×1

## 2015-08-16 NOTE — Anesthesia Postprocedure Evaluation (Signed)
Anesthesia Post Note  Patient: Rickey Smith  Procedure(s) Performed: Procedure(s) (LRB): TRANSURETHRAL RESECTION DRAINAGE OF PROSTATE ABCESS (N/A)  Patient location during evaluation: PACU Anesthesia Type: General Level of consciousness: awake and alert Pain management: pain level controlled Vital Signs Assessment: post-procedure vital signs reviewed and stable Respiratory status: spontaneous breathing, nonlabored ventilation, respiratory function stable and patient connected to nasal cannula oxygen Cardiovascular status: blood pressure returned to baseline and stable Postop Assessment: no signs of nausea or vomiting Anesthetic complications: no    Last Vitals:  Filed Vitals:   08/16/15 1130 08/16/15 1145  BP: 113/74 117/91  Pulse: 94 96  Temp:  36.8 C  Resp: 14 33    Last Pain:  Filed Vitals:   08/16/15 1156  PainSc: 5                  Clover Feehan S

## 2015-08-16 NOTE — Op Note (Signed)
Preoperative diagnosis: Urinary retention, prostate abscess Postoperative diagnosis: Same  Procedure: TUR unroofing of prostate abscess   Surgeon: Valetta Fuller M.D.  Anesthesia: Gen.  Indications: Patient was admitted with urinary retention and what appeared to be prostatitis/UTI. Pelvic CT imaging revealed what appeared to be an early right-sided prostate abscess. The patient's urine culture eventually grew MRSA. The patient clinically has done well and has had only minimal perineal discomfort. He has had an indwelling Foley catheter. Follow-up CT several days ago showed enlargement and better definition of the right-sided prostatic abscess. He now presents for unroofing to assure adequate drainage. The patient underwent consultation with regard to the risks and benefits of this procedure. He appeared to understand the issues involved.     Technique and findings: This was brought the operating room where he had successful induction general anesthesia. He was placed in lithotomy position and prepped and draped in usual manner. Appropriate surgical timeout was performed. The patient has been on intravenous vancomycin. A 26 French continuous flow resectoscope was inserted. The prostatic urethra itself was relatively short at 3 cm. There was minimal lateral lobe tissue but a fairly high riding and prominent median bar. There was some generalized mucosal edema and erythema consistent with cystitis. A loop was utilized in the right lateral wall of the prostate between the bladder neck and verumontanum. A large amount of. Material was obtained and there was a large abscess cavity noted. This was unroofed enough to assure adequate drainage. A small amount of prosthetic tissue was resected especially right around the bladder neck and that was sent for pathologic analysis. A new Foley catheter was inserted at the completion of the procedure after copious irrigation. He was brought to PACU in stable condition. No  obvious complications occurred.

## 2015-08-16 NOTE — Progress Notes (Addendum)
Pharmacy Antibiotic Note  Rickey Smith is a 50 y.o. male admitted on 08/11/2015 with UTI and prostate abscess.  Pharmacy has been consulted for vancomycin dosing. Urine reveals MRSA. Urology is consulted.  Plan: OR today for abscess drainage F/u vancomycin trough / level when able after surgery F/u renal fxn - BMET in AM Pt also on ceftriaxone - increase to 2g IV q24h.   Height:  (185.4 cm) Weight: 155 lb 3.3 oz (70.4 kg) IBW/kg (Calculated) : 79.9  Temp (24hrs), Avg:98 F (36.7 C), Min:97.6 F (36.4 C), Max:98.4 F (36.9 C)   Recent Labs Lab 08/11/15 1058 08/11/15 1434 08/11/15 1710 08/12/15 0542  WBC 25.2*  --   --  18.2*  CREATININE 0.94  --   --  0.83  LATICACIDVEN  --  0.83 0.83  --     Estimated Creatinine Clearance: 108.4 mL/min (by C-G formula based on Cr of 0.83).    Allergies  Allergen Reactions  . Codeine Itching  . Gabapentin Diarrhea and Nausea And Vomiting    Antimicrobials this admission: PTA Cipro x1 day Ceftriaxone x1 on 2/17, 2/20 >> Zosyn 2/17>>2/20 Vanc 2/20>>  Dose adjustments this admission: 2/22: Increase ceftriaxone 1g q24h --> 2g q24h  Microbiology results: 2/17 BCx2: NGTD 2/17 UCx: MRSA, vanc MIC 1 2/8 BCx2: NEG 2/22 MRSA PCR negative  Thank you for allowing pharmacy to be a part of this patient's care.  Haynes Hoehn, PharmD, BCPS 08/16/2015, 9:21 AM  Pager: [phone]   Addendum: VT = 11 mcg/ml. Increase to Vanc 1g IV q8h  Haynes Hoehn, PharmD, BCPS 08/16/2015, 3:09 PM  Pager: [phone]

## 2015-08-16 NOTE — Interval H&P Note (Signed)
History and Physical Interval Note:  08/16/2015 10:01 AM  Rickey Smith  has presented today for surgery, with the diagnosis of PROSTATE ABCESS  The various methods of treatment have been discussed with the patient and family. After consideration of risks, benefits and other options for treatment, the patient has consented to  Procedure(s): TRANSURETHRAL RESECTION OF THE PROSTATE (TURP) (N/A) as a surgical intervention .  The patient's history has been reviewed, patient examined, no change in status, stable for surgery.  I have reviewed the patient's chart and labs.  Questions were answered to the patient's satisfaction.     Malyia Moro S

## 2015-08-16 NOTE — Progress Notes (Signed)
Patient in surgery. Will check back this PM Rickey Canary DO

## 2015-08-16 NOTE — Progress Notes (Signed)
Patient's CBG 461, notified Dr. Zenaida Niece and she stated to give the 2200 Levemir now-early. And no need to draw lab for blood glucose. Will continue to assess patient.

## 2015-08-16 NOTE — Anesthesia Preprocedure Evaluation (Addendum)
Anesthesia Evaluation  Patient identified by MRN, date of birth, ID band Patient awake    Reviewed: Allergy & Precautions, H&P , Patient's Chart, lab work & pertinent test results, reviewed documented beta blocker date and time   Airway Mallampati: II  TM Distance: >3 FB Neck ROM: full    Dental no notable dental hx.    Pulmonary former smoker,    Pulmonary exam normal breath sounds clear to auscultation       Cardiovascular  Rhythm:regular Rate:Normal     Neuro/Psych    GI/Hepatic   Endo/Other  diabetes  Renal/GU      Musculoskeletal   Abdominal   Peds  Hematology   Anesthesia Other Findings   Reproductive/Obstetrics                             Anesthesia Physical Anesthesia Plan  ASA: II  Anesthesia Plan:    Post-op Pain Management:    Induction: Intravenous  Airway Management Planned: LMA  Additional Equipment:   Intra-op Plan:   Post-operative Plan:   Informed Consent: I have reviewed the patients History and Physical, chart, labs and discussed the procedure including the risks, benefits and alternatives for the proposed anesthesia with the patient or authorized representative who has indicated his/her understanding and acceptance.   Dental Advisory Given and Dental advisory given  Plan Discussed with: CRNA and Surgeon  Anesthesia Plan Comments: (Discussed GA with LMA, possible sore throat, potential need to switch to ETT, N/V, pulmonary aspiration. Questions answered. )        Anesthesia Quick Evaluation

## 2015-08-16 NOTE — H&P (View-Only) (Signed)
Urology Consult   Physician requesting consult: Nani Skillern  Reason for consult: UTI and prostate abscess  History of Present Illness: Rickey Smith is a 50 y.o. male with PMH significant for DM, tachycardia, diabetic neuropathy, and DKA who presented to the ED with c/o lower abdominal pain, dysuria, inability to void, and bilateral flank pain.  Pt was recently admitted 2/8-2/10/17 for treatment of SIRS due to dehydration associated with hyperglycemia and hyperosmolarity.  He was seen in outpatient f/u on 08/10/15 for this with as well as new c/o dysuria and difficulty voiding that had been present for about a week.  He was found to have a UTI and given a script for Cipro which he did not start.  Overnight,  the dysuria and lower abdominal pain have increased and he developed retention.  Since last night he has only been able to void drops of blood.  He denies fevers, chills, HA, CP, nausea, and vomiting. Pt is currently resting comfortably.   In the ED UA is nitrite positive with large RBCs, WBCs, and bacteria.  CT A/P reveals normal kidneys and ureters with a 3cm irregular low-attentuation area in the prostate consistent with an abscess. WBC 25, Cr 0.94.  Foley was placed with >600cc dark red urine returned. Blood and urine cultures were collected.  Pt was started on Rocephin and Zosyn.  He denies a history of voiding or storage urinary symptoms, hematuria, UTIs, STDs, urolithiasis, GU malignancy/trauma/surgery.  He has not been sexually active for several years.  He quit smoking 1 year ago (smoked 1ppd x 35 years) and has been sober since 2005.    Of note, he was seen in the ED 03/2015 for migraines and found to have a 3mm ICA aneurysm for which he was referred for outpatient f/u.  He has not gone due to money issues.  He had a tooth pulled last week due to an abscess and states he was told he has several more abscessed teeth that would have to be pulled at a later date.  He has not been on  Abx for these.       Past Medical History  Diagnosis Date  . Diabetes mellitus without complication (HCC)   multiple tooth abscesses Possible ICA aneurysm Diabetic neuropathy GERD tachycardia  History reviewed. No pertinent past surgical history.  Current Hospital Medications:  Home Meds:    Medication List    ASK your doctor about these medications        amitriptyline 75 MG tablet  Commonly known as:  ELAVIL  Take 1 tablet (75 mg total) by mouth at bedtime.     atorvastatin 40 MG tablet  Commonly known as:  LIPITOR  Take 1 tablet (40 mg total) by mouth daily at 6 PM.     ciprofloxacin 500 MG tablet  Commonly known as:  CIPRO  Take 1 tablet (500 mg total) by mouth 2 (two) times daily.     insulin aspart protamine- aspart (70-30) 100 UNIT/ML injection  Commonly known as:  NOVOLOG MIX 70/30  Inject 0.22 mLs (22 Units total) into the skin 2 (two) times daily with a meal.     lisinopril 2.5 MG tablet  Commonly known as:  PRINIVIL,ZESTRIL  Take 2 tablets (5 mg total) by mouth daily.     metFORMIN 500 MG tablet  Commonly known as:  GLUCOPHAGE  Take 500 mg by mouth daily with breakfast.     metoprolol tartrate 25 MG tablet  Commonly known as:  LOPRESSOR  Take 1 tablet (25 mg total) by mouth 2 (two) times daily.     ondansetron 4 MG tablet  Commonly known as:  ZOFRAN  Take 1 tablet (4 mg total) by mouth every 6 (six) hours.        Scheduled Meds: . amitriptyline  75 mg Oral QHS  . atorvastatin  40 mg Oral q1800  . enoxaparin (LOVENOX) injection  40 mg Subcutaneous Q24H  . insulin aspart  0-15 Units Subcutaneous TID WC  . insulin aspart  0-5 Units Subcutaneous QHS  . metoprolol tartrate  25 mg Oral BID  . sodium chloride flush  3 mL Intravenous Q12H   Continuous Infusions: . sodium chloride    . piperacillin-tazobactam 3.375 g (08/11/15 1648)  . piperacillin-tazobactam (ZOSYN)  IV     PRN Meds:.acetaminophen **OR** acetaminophen, bisacodyl,  diphenhydrAMINE, HYDROcodone-acetaminophen, HYDROmorphone (DILAUDID) injection, ondansetron **OR** ondansetron (ZOFRAN) IV, traZODone  Allergies:  Allergies  Allergen Reactions  . Codeine Itching  . Gabapentin Diarrhea and Nausea And Vomiting    Family History  Problem Relation Age of Onset  . Heart attack    . Prostate cancer    . Hypertension    . Hypertension Mother   . Cancer Father   prostate cancer father treated with radiation and possibly chemo ( developed bone mets)  Social History:  reports that he quit smoking about a year ago. His smoking use included Cigarettes. He has a 20 pack-year smoking history. He has never used smokeless tobacco. He reports that he does not drink alcohol or use illicit drugs.  ROS: A complete review of systems was performed.  All systems are negative except for pertinent findings as noted.  Physical Exam:  Vital signs in last 24 hours: Temp:  [98.2 F (36.8 C)] 98.2 F (36.8 C) (02/17 0916) Pulse Rate:  [114-142] 114 (02/17 1545) Resp:  [13-24] 23 (02/17 1545) BP: (107-131)/(73-81) 118/77 mmHg (02/17 1545) SpO2:  [96 %-99 %] 99 % (02/17 1545) Constitutional:  Alert and oriented, No acute distress Cardiovascular: Regular rate and rhythm Respiratory: Normal respiratory effort GI: Abdomen is soft, nontender, nondistended, no abdominal masses GU: circ penis; no lesions noted; foley in place with dark red urine in bag; prostate exam deferred  Lymphatic: No lymphadenopathy Neurologic: Grossly intact, no focal deficits Psychiatric: Normal mood and affect  Laboratory Data:   Recent Labs  08/11/15 1058  WBC 25.2*  HGB 12.2*  HCT 36.6*  PLT 386     Recent Labs  08/11/15 1058  NA 137  K 4.4  CL 101  GLUCOSE 193*  BUN 9  CALCIUM 9.1  CREATININE 0.94     Results for orders placed or performed during the hospital encounter of 08/11/15 (from the past 24 hour(s))  Comprehensive metabolic panel     Status: Abnormal   Collection  Time: 08/11/15 10:58 AM  Result Value Ref Range   Sodium 137 135 - 145 mmol/L   Potassium 4.4 3.5 - 5.1 mmol/L   Chloride 101 101 - 111 mmol/L   CO2 24 22 - 32 mmol/L   Glucose, Bld 193 (H) 65 - 99 mg/dL   BUN 9 6 - 20 mg/dL   Creatinine, Ser 4.09 0.61 - 1.24 mg/dL   Calcium 9.1 8.9 - 81.1 mg/dL   Total Protein 7.0 6.5 - 8.1 g/dL   Albumin 3.0 (L) 3.5 - 5.0 g/dL   AST 15 15 - 41 U/L   ALT 9 (L) 17 - 63 U/L   Alkaline Phosphatase 105  38 - 126 U/L   Total Bilirubin 0.3 0.3 - 1.2 mg/dL   GFR calc non Af Amer >60 >60 mL/min   GFR calc Af Amer >60 >60 mL/min   Anion gap 12 5 - 15  CBC with Differential     Status: Abnormal   Collection Time: 08/11/15 10:58 AM  Result Value Ref Range   WBC 25.2 (H) 4.0 - 10.5 K/uL   RBC 4.19 (L) 4.22 - 5.81 MIL/uL   Hemoglobin 12.2 (L) 13.0 - 17.0 g/dL   HCT 40.9 (L) 81.1 - 91.4 %   MCV 87.4 78.0 - 100.0 fL   MCH 29.1 26.0 - 34.0 pg   MCHC 33.3 30.0 - 36.0 g/dL   RDW 78.2 95.6 - 21.3 %   Platelets 386 150 - 400 K/uL   Neutrophils Relative % 89 %   Lymphocytes Relative 7 %   Monocytes Relative 3 %   Eosinophils Relative 1 %   Basophils Relative 0 %   Neutro Abs 22.3 (H) 1.7 - 7.7 K/uL   Lymphs Abs 1.8 0.7 - 4.0 K/uL   Monocytes Absolute 0.8 0.1 - 1.0 K/uL   Eosinophils Absolute 0.3 0.0 - 0.7 K/uL   Basophils Absolute 0.0 0.0 - 0.1 K/uL   WBC Morphology INCREASED BANDS (>20% BANDS)   Urinalysis, Routine w reflex microscopic (not at [facility])     Status: Abnormal   Collection Time: 08/11/15 11:53 AM  Result Value Ref Range   Color, Urine YELLOW YELLOW   APPearance TURBID (A) CLEAR   Specific Gravity, Urine 1.025 1.005 - 1.030   pH 6.5 5.0 - 8.0   Glucose, UA 500 (A) NEGATIVE mg/dL   Hgb urine dipstick LARGE (A) NEGATIVE   Bilirubin Urine SMALL (A) NEGATIVE   Ketones, ur 15 (A) NEGATIVE mg/dL   Protein, ur >086 (A) NEGATIVE mg/dL   Nitrite POSITIVE (A) NEGATIVE   Leukocytes, UA MODERATE (A) NEGATIVE  Gram stain     Status: None   Collection  Time: 08/11/15 11:53 AM  Result Value Ref Range   Specimen Description URINE, CLEAN CATCH    Special Requests Normal    Gram Stain      ABUNDANT WBC PRESENT, PREDOMINANTLY PMN ABUNDANT GRAM POSITIVE COCCI IN CLUSTERS    Report Status 08/11/2015 FINAL   Urine microscopic-add on     Status: Abnormal   Collection Time: 08/11/15 11:53 AM  Result Value Ref Range   Squamous Epithelial / LPF 0-5 (A) NONE SEEN   WBC, UA TOO NUMEROUS TO COUNT 0 - 5 WBC/hpf   RBC / HPF TOO NUMEROUS TO COUNT 0 - 5 RBC/hpf   Bacteria, UA MANY (A) NONE SEEN   Urine-Other MUCOUS PRESENT   I-Stat CG4 Lactic Acid, ED     Status: None   Collection Time: 08/11/15  2:34 PM  Result Value Ref Range   Lactic Acid, Venous 0.83 0.5 - 2.0 mmol/L   Recent Results (from the past 240 hour(s))  Culture, blood (routine x 2)     Status: None   Collection Time: 08/02/15  6:30 PM  Result Value Ref Range Status   Specimen Description BLOOD RIGHT HAND  Final   Special Requests IN PEDIATRIC BOTTLE 3CC  Final   Culture NO GROWTH 5 DAYS  Final   Report Status 08/07/2015 FINAL  Final  Culture, blood (routine x 2)     Status: None   Collection Time: 08/02/15  6:35 PM  Result Value Ref Range Status  Specimen Description BLOOD LEFT ANTECUBITAL  Final   Special Requests BOTTLES DRAWN AEROBIC AND ANAEROBIC 5CC  Final   Culture NO GROWTH 5 DAYS  Final   Report Status 08/07/2015 FINAL  Final  Gram stain     Status: None   Collection Time: 08/11/15 11:53 AM  Result Value Ref Range Status   Specimen Description URINE, CLEAN CATCH  Final   Special Requests Normal  Final   Gram Stain   Final    ABUNDANT WBC PRESENT, PREDOMINANTLY PMN ABUNDANT GRAM POSITIVE COCCI IN CLUSTERS    Report Status 08/11/2015 FINAL  Final    Renal Function:  Recent Labs  08/11/15 1058  CREATININE 0.94   Estimated Creatinine Clearance: 96.8 mL/min (by C-G formula based on Cr of 0.94).  Radiologic Imaging: Ct Renal Stone Study  08/11/2015  CLINICAL  DATA:  Bilateral flank pain for 1 week that has worsened. Difficulty urinating. History also mentions hematuria. EXAM: CT ABDOMEN AND PELVIS WITHOUT CONTRAST TECHNIQUE: Multidetector CT imaging of the abdomen and pelvis was performed following the standard protocol without IV contrast. COMPARISON:  None. FINDINGS: Lung bases: Minor dependent subsegmental atelectasis. Small nodules, 3 in right lower lobe and 1 peripheral right middle lobe, which is the largest measuring 5 mm. Heart is normal in size. At liver, spleen, gallbladder, pancreas:  Normal. Adrenal glands: 2 cm left adrenal adenoma. Normal right adrenal gland. Kidneys, ureters, bladder: Normal kidneys in ureters. No stones or obstructive uropathy. Bladder is mostly decompressed with a Foley catheter. Wall may be mildly thickened. Reproductive: Prostate is enlarged and heterogeneous. There is a 3 x 2.9 x 2.4 cm irregular area of low attenuation within the right aspect the prostate suggesting an abscess. There is mild surrounding hazy inflammatory change and fat stranding extending around the seminal vesicles posterior bladder. No enlarged lymph nodes. Ascites:  None. Gastrointestinal: Stomach and small bowel are unremarkable. There is mild increased stool throughout the colon. No other colon abnormality. Normal appendix is visualized. There is some mild hazy opacity along posterior margin of the cecum consistent with inflammatory changes arising from the pelvis. Musculoskeletal: Degenerative changes of the lower lumbar spine. No osteoblastic or osteolytic lesions. IMPRESSION: 1. 3 cm irregular low-attenuation area within the prostate consistent with an abscess. There is surrounding inflammatory changes. The bladder appears walled, which is likely reactive, but could reflect infectious cystitis. 2. No other acute findings. 3. Normal kidneys in ureters. 4. Normal appendix. 5. Mild increased stool throughout the colon. Electronically Signed   By: Amie Portland  M.D.   On: 08/11/2015 13:39     Impression/Recommendation  UTI with prostate abscess--admit for IV ABx and monitor closely.  If pt remains stable (AF and WBC improves) he may be able to avoid further intervention.  Will need repeat CT of pelvis early next week.   If he develops fevers or increased WBC despite ABx he will require TURP with unroofing of abscess at [facility].  F/u urine culture and change ABx if needed.  Ok to maintain foley for now but will need void trial prior to d/c.  Retention should improve with resolution of infection. Consider adding flomax short term. Dr. Isabel Caprice will eval later today and follow during admission.    Agree with history and exam as above. Probable early prostatic abscess. This may resolve with intervenous antibiotics. If his clinical situation does not improve or if he does develop improvement but then has a decline in his clinical status it is likely he would  then require transurethral unroofing of the prostate abscess. Would recommend repeat pelvic CT in 48-72 hours. DANCY, AMANDA 08/11/2015, 5:11 PM

## 2015-08-16 NOTE — Anesthesia Procedure Notes (Signed)
Procedure Name: LMA Insertion Date/Time: 08/16/2015 10:27 AM Performed by: Enriqueta Shutter D Pre-anesthesia Checklist: Patient identified, Emergency Drugs available, Suction available and Patient being monitored Patient Re-evaluated:Patient Re-evaluated prior to inductionOxygen Delivery Method: Circle System Utilized Preoxygenation: Pre-oxygenation with 100% oxygen Intubation Type: IV induction Ventilation: Mask ventilation without difficulty LMA: LMA with gastric port inserted LMA Size: 5.0 Number of attempts: 1 Airway Equipment and Method: Oral airway Placement Confirmation: ETT inserted through vocal cords under direct vision,  positive ETCO2 and breath sounds checked- equal and bilateral Tube secured with: Tape Dental Injury: Teeth and Oropharynx as per pre-operative assessment

## 2015-08-16 NOTE — Progress Notes (Signed)
Hypoglycemic Event  CBG: 57  Treatment: orange juice  Symptoms: shaking  Follow-up CBG: Time:2213 CBG Result:80  Possible Reasons for Event: unknown  Comments/MD notified: no, protocol followed    Rickey Smith A

## 2015-08-16 NOTE — Telephone Encounter (Signed)
Call placed to Lanier Clam, RN CM to inform her that the patient has been followed at the [facility] and an appointment will be scheduled for him when the discharge date is determined.   Will follow his hospital course.

## 2015-08-16 NOTE — Progress Notes (Signed)
[facility] PROGRESS NOTE    Progress Note   Rickey Smith JXB:[id number] DOB: May 14, 1966 DOA: 08/11/2015 PCP: No PCP Per Patient   Brief Narrative:   Rickey Smith is an 50 y.o. male   Assessment/Plan:   Sepsis due Prostate abscess: Repeat CT scan show a large abscess, s/p surgery on 08/16/2015. MRSA: vancomycin-- drop rocephin--- change to bactrim DS BID once stable-- will discuss length of treatment with urology HIV negative  Tachycardia: Likely due to pain Now Resolved.  Uncontrolled Diabetic neuropathy (HCC): A1c was 10.5. Increase long-acting insulin again,  continue sliding scale. -monitor now that infection being treated  Cocaine abuse +2/17  DVT Prophylaxis - Lovenox ordered.  Family Communication: none Disposition Plan: Code Status:     Code Status Orders        Start     Ordered   08/11/15 1512  Full code   Continuous     08/11/15 1513    Code Status History    Date Active Date Inactive Code Status Order ID Comments User Context   08/02/2015  7:12 PM 08/04/2015  4:43 PM Full Code [id number]  Zannie Cove, MD Inpatient   10/23/2014 12:32 PM 10/26/2014  6:31 PM Full Code [id number]  Marinda Elk, MD Inpatient   07/13/2014  9:28 AM 07/14/2014  5:05 PM Full Code [id number]  Jerald Kief, MD Inpatient   04/26/2014 12:51 PM 04/28/2014  7:04 PM Full Code [id number]  Rhetta Mura, MD Inpatient           Procedures and diagnostic studies:   Un-roofing of abscess -foley placement  Medical Consultants:    urology  Anti-Infectives:   Anti-infectives    Start     Dose/Rate Route Frequency Ordered Stop   08/16/15 1230  cefTRIAXone (ROCEPHIN) 2 g in dextrose 5 % 50 mL IVPB     2 g 100 mL/hr over 30 Minutes Intravenous Every 24 hours 08/16/15 0927     08/14/15 1800  vancomycin (VANCOCIN) IVPB 750 mg/150 ml premix     750 mg 150 mL/hr over 60 Minutes Intravenous Every 8 hours 08/14/15 1200     08/14/15 1230  cefTRIAXone  (ROCEPHIN) 1 g in dextrose 5 % 50 mL IVPB  Status:  Discontinued     1 g 100 mL/hr over 30 Minutes Intravenous Every 24 hours 08/14/15 1217 08/16/15 0927   08/14/15 0930  vancomycin (VANCOCIN) 1,500 mg in sodium chloride 0.9 % 500 mL IVPB     1,500 mg 250 mL/hr over 120 Minutes Intravenous NOW 08/14/15 0924 08/14/15 1229   08/11/15 1645  piperacillin-tazobactam (ZOSYN) IVPB 3.375 g  Status:  Discontinued     3.375 g 12.5 mL/hr over 240 Minutes Intravenous 3 times per day 08/11/15 1642 08/14/15 1134   08/11/15 1400  cefTRIAXone (ROCEPHIN) 2 g in dextrose 5 % 50 mL IVPB     2 g 100 mL/hr over 30 Minutes Intravenous  Once 08/11/15 1345 08/11/15 1613   08/11/15 1400  piperacillin-tazobactam (ZOSYN) IVPB 3.375 g     3.375 g 100 mL/hr over 30 Minutes Intravenous  Once 08/11/15 1345 08/11/15 1718      Subjective:    Just back from PACU -feels "fine" except his mouth is dry  Objective:    Filed Vitals:   08/16/15 1158 08/16/15 1214 08/16/15 1310 08/16/15 1420  BP: 118/81 121/76 124/83 118/67  Pulse: 94 90 99 102  Temp:  98 F (36.7 C)  97.9 F (36.6 C)  TempSrc:  Oral  Resp: Height:      Weight:      SpO2: 100% 99% 98% 97%    Intake/Output Summary (Last 24 hours) at 08/16/15 1501 Last data filed at 08/16/15 1300  Gross per 24 hour  Intake   1140 ml  Output   3550 ml  Net  -2410 ml   Filed Weights   08/11/15 1952 08/15/15 2209  Weight: 71.804 kg (158 lb 4.8 oz) 70.4 kg (155 lb 3.3 oz)    Exam: Gen:  NAD Cardiovascular:  RRR. Chest and lungs:   CTAB Abdomen:  Abdomen soft, NT/ND, + BS Extremities:  No edema   Data Reviewed:    Labs: Basic Metabolic Panel:  Recent Labs Lab 08/11/15 1058 08/12/15 0542 08/16/15 1139  NA 137 137 136  K 4.4 4.6 4.5  CL 101 100* 99*  CO2 GLUCOSE 193* 257* 154*  BUN 9 <5* 10  CREATININE 0.94 0.83 0.84  CALCIUM 9.1 8.7* 8.7*   GFR Estimated Creatinine Clearance: 107.1 mL/min (by C-G formula based  on Cr of 0.84). Liver Function Tests:  Recent Labs Lab 08/11/15 1058 08/12/15 0542  AST 15 8*  ALT 9* 8*  ALKPHOS 105 76  BILITOT 0.3 0.4  PROT 7.0 5.5*  ALBUMIN 3.0* 2.4*   No results for input(s): LIPASE, AMYLASE in the last 168 hours. No results for input(s): AMMONIA in the last 168 hours. Coagulation profile No results for input(s): INR, PROTIME in the last 168 hours.  CBC:  Recent Labs Lab 08/11/15 1058 08/12/15 0542  WBC 25.2* 18.2*  NEUTROABS 22.3*  --   HGB 12.2* 10.9*  HCT 36.6* 35.1*  MCV 87.4 88.9  PLT 386 325   Cardiac Enzymes: No results for input(s): CKTOTAL, CKMB, CKMBINDEX, TROPONINI in the last 168 hours. BNP (last 3 results) No results for input(s): PROBNP in the last 8760 hours. CBG:  Recent Labs Lab 08/15/15 2217 08/16/15 0731 08/16/15 0946 08/16/15 1112 08/16/15 1212  GLUCAP 186* 204* 153* 164* 144*   D-Dimer: No results for input(s): DDIMER in the last 72 hours. Hgb A1c: No results for input(s): HGBA1C in the last 72 hours. Lipid Profile: No results for input(s): CHOL, HDL, LDLCALC, TRIG, CHOLHDL, LDLDIRECT in the last 72 hours. Thyroid function studies: No results for input(s): TSH, T4TOTAL, T3FREE, THYROIDAB in the last 72 hours.  Invalid input(s): FREET3 Anemia work up: No results for input(s): VITAMINB12, FOLATE, FERRITIN, TIBC, IRON, RETICCTPCT in the last 72 hours. Sepsis Labs:  Recent Labs Lab 08/11/15 1058 08/11/15 1434 08/11/15 1710 08/12/15 0542  WBC 25.2*  --   --  18.2*  LATICACIDVEN  --  0.83 0.83  --    Microbiology Recent Results (from the past 240 hour(s))  Gram stain     Status: None   Collection Time: 08/11/15 11:53 AM  Result Value Ref Range Status   Specimen Description URINE, CLEAN CATCH  Final   Special Requests Normal  Final   Gram Stain   Final    ABUNDANT WBC PRESENT, PREDOMINANTLY PMN ABUNDANT GRAM POSITIVE COCCI IN CLUSTERS    Report Status 08/11/2015 FINAL  Final  Culture, Urine      Status: None   Collection Time: 08/11/15 11:53 AM  Result Value Ref Range Status   Specimen Description URINE, CATHETERIZED  Final   Special Requests NONE  Final   Culture   Final    >=100,000 COLONIES/mL METHICILLIN RESISTANT STAPHYLOCOCCUS AUREUS  Report Status 08/14/2015 FINAL  Final   Organism ID, Bacteria METHICILLIN RESISTANT STAPHYLOCOCCUS AUREUS  Final      Susceptibility   Methicillin resistant staphylococcus aureus - MIC*    CIPROFLOXACIN <=0.5 SENSITIVE Sensitive     GENTAMICIN <=0.5 SENSITIVE Sensitive     NITROFURANTOIN <=16 SENSITIVE Sensitive     OXACILLIN >=4 RESISTANT Resistant     TETRACYCLINE <=1 SENSITIVE Sensitive     VANCOMYCIN 1 SENSITIVE Sensitive     TRIMETH/SULFA <=10 SENSITIVE Sensitive     CLINDAMYCIN <=0.25 SENSITIVE Sensitive     RIFAMPIN <=0.5 SENSITIVE Sensitive     Inducible Clindamycin NEGATIVE Sensitive     * >=100,000 COLONIES/mL METHICILLIN RESISTANT STAPHYLOCOCCUS AUREUS  Blood culture (routine x 2)     Status: None   Collection Time: 08/11/15  2:17 PM  Result Value Ref Range Status   Specimen Description BLOOD LEFT ANTECUBITAL  Final   Special Requests BOTTLES DRAWN AEROBIC AND ANAEROBIC 5CC  Final   Culture NO GROWTH 5 DAYS  Final   Report Status 08/16/2015 FINAL  Final  Blood culture (routine x 2)     Status: None   Collection Time: 08/11/15  2:21 PM  Result Value Ref Range Status   Specimen Description BLOOD LEFT FOREARM  Final   Special Requests BOTTLES DRAWN AEROBIC AND ANAEROBIC 5CCS  Final   Culture NO GROWTH 5 DAYS  Final   Report Status 08/16/2015 FINAL  Final  Surgical PCR screen     Status: None   Collection Time: 08/16/15  5:43 AM  Result Value Ref Range Status   MRSA, PCR NEGATIVE NEGATIVE Final   Staphylococcus aureus NEGATIVE NEGATIVE Final    Comment:        The Xpert SA Assay (FDA approved for NASAL specimens in patients over 7 years of age), is one component of a comprehensive surveillance program.  Test  performance has been validated by [facility] for patients greater than or equal to 78 year old. It is not intended to diagnose infection nor to guide or monitor treatment.      Medications:   . amitriptyline  75 mg Oral QHS  . atorvastatin  40 mg Oral q1800  . cefTRIAXone (ROCEPHIN)  IV  2 g Intravenous Q24H  . enoxaparin (LOVENOX) injection  40 mg Subcutaneous Q24H  . insulin aspart  0-15 Units Subcutaneous TID WC  . insulin aspart  0-5 Units Subcutaneous QHS  . insulin detemir  30 Units Subcutaneous BID  . metoprolol tartrate  25 mg Oral BID  . polyethylene glycol  17 g Oral BID  . sodium chloride flush  3 mL Intravenous Q12H  . tamsulosin  0.4 mg Oral QPC breakfast  . vancomycin  750 mg Intravenous Q8H   Continuous Infusions:    Time spent: 15 min   LOS: 5 days   Peter Keyworth U [facility]  [facility] Pager [phone]    08/16/2015, 3:01 PM

## 2015-08-16 NOTE — Transfer of Care (Signed)
Immediate Anesthesia Transfer of Care Note  Patient: Rickey Smith  Procedure(s) Performed: Procedure(s): TRANSURETHRAL RESECTION DRAINAGE OF PROSTATE ABCESS (N/A)  Patient Location: PACU  Anesthesia Type:General  Level of Consciousness: awake, alert  and oriented  Airway & Oxygen Therapy: Patient Spontanous Breathing and Patient connected to face mask oxygen  Post-op Assessment: Report given to RN and Post -op Vital signs reviewed and stable  Post vital signs: Reviewed and stable  Last Vitals:  Filed Vitals:   08/15/15 2209 08/16/15 0543  BP: 101/69 112/73  Pulse: 94 88  Temp: 36.9 C 36.4 C  Resp: 18 20    Complications: No apparent anesthesia complications

## 2015-08-17 LAB — BASIC METABOLIC PANEL
Anion gap: 8 (ref 5–15)
BUN: 11 mg/dL (ref 6–20)
CHLORIDE: 101 mmol/L (ref 101–111)
CO2: 32 mmol/L (ref 22–32)
CREATININE: 0.81 mg/dL (ref 0.61–1.24)
Calcium: 9.3 mg/dL (ref 8.9–10.3)
GFR calc Af Amer: >60 mL/min (ref >60)
GFR calc non Af Amer: >60 mL/min (ref >60)
Glucose, Bld: 182 mg/dL — ABNORMAL HIGH (ref 65–99)
Potassium: 4.3 mmol/L (ref 3.5–5.1)
SODIUM: 141 mmol/L (ref 135–145)

## 2015-08-17 LAB — CBC
HCT: 34.8 % — ABNORMAL LOW (ref 39.0–52.0)
HEMOGLOBIN: 11.1 g/dL — AB (ref 13.0–17.0)
MCH: 28.4 pg (ref 26.0–34.0)
MCHC: 31.9 g/dL (ref 30.0–36.0)
MCV: 89 fL (ref 78.0–100.0)
Platelets: 351 10*3/uL (ref 150–400)
RBC: 3.91 MIL/uL — ABNORMAL LOW (ref 4.22–5.81)
RDW: 12.8 % (ref 11.5–15.5)
WBC: 10 10*3/uL (ref 4.0–10.5)

## 2015-08-17 LAB — GLUCOSE, CAPILLARY
GLUCOSE-CAPILLARY: 316 mg/dL — AB (ref 65–99)
Glucose-Capillary: 181 mg/dL — ABNORMAL HIGH (ref 65–99)

## 2015-08-17 MED ORDER — SULFAMETHOXAZOLE-TRIMETHOPRIM 800-160 MG PO TABS
1.0000 | ORAL_TABLET | Freq: Two times a day (BID) | ORAL | Status: DC
  Administered 2015-08-17: 1 via ORAL
  Filled 2015-08-17 (×2): qty 1

## 2015-08-17 MED ORDER — INSULIN ASPART PROT & ASPART (70-30 MIX) 100 UNIT/ML ~~LOC~~ SUSP: 45.0000 [IU] | Freq: Two times a day (BID) | SUBCUTANEOUS | Status: DC

## 2015-08-17 MED ORDER — INSULIN DETEMIR 100 UNIT/ML ~~LOC~~ SOLN
30.0000 [IU] | Freq: Two times a day (BID) | SUBCUTANEOUS | Status: DC
  Administered 2015-08-17: 30 [IU] via SUBCUTANEOUS
  Filled 2015-08-17: qty 0.3

## 2015-08-17 MED ORDER — INSULIN DETEMIR 100 UNIT/ML ~~LOC~~ SOLN: 25.0000 [IU] | Freq: Two times a day (BID) | SUBCUTANEOUS | Status: DC

## 2015-08-17 MED ORDER — SULFAMETHOXAZOLE-TRIMETHOPRIM 800-160 MG PO TABS: 1.0000 | ORAL_TABLET | Freq: Two times a day (BID) | ORAL | Status: DC

## 2015-08-17 MED ORDER — HYDROCODONE-ACETAMINOPHEN 5-325 MG PO TABS: 1.0000 | ORAL_TABLET | ORAL | Status: DC | PRN

## 2015-08-17 NOTE — Care Management Note (Signed)
Case Management Note  Patient Details  Name: Rickey Smith MRN: [id number] Date of Birth: 06/13/1966  Subjective/Objective:  [facility] chosen for [facility] rep aware of referral & d/c.                  Action/Plan:d/c home w/HHC.   Expected Discharge Date:                  Expected Discharge Plan:  Home w Home Health Services  In-House Referral:  Clinical Social Work  Discharge planning Services  CM Consult  Post Acute Care Choice:    Choice offered to:     DME Arranged:    DME Agency:     HH Arranged:  RN HH Agency:  [facility]  Status of Service:  Completed, signed off  Medicare Important Message Given:    Date Medicare IM Given:    Medicare IM give by:    Date Additional Medicare IM Given:    Additional Medicare Important Message give by:     If discussed at Long Length of Stay Meetings, dates discussed:    Additional Comments:  Lanier Clam, RN 08/17/2015, 12:22 PM

## 2015-08-17 NOTE — Hospital Discharge Follow-Up (Signed)
Transitional Care Clinic:  Patient known to the Transitional Care Clinic at [facility]. Anticipated to discharge home today per Lanier Clam, RN CM. Spoke with patient to schedule Transitional Care Clinic follow-up appointment. Appointment scheduled for 08/22/15 at 1430 with Dr. Venetia Night.  AVS updated.  Patient reminded of [facility] pharmacy resources and reminded him he can use pharmacy if needed for discharge medications. Also reminded patient he will receive a discharge follow-up phone call 24-48 hours after discharge. Patient verbalized understanding. Lanier Clam, RN CM updated.

## 2015-08-18 ENCOUNTER — Telehealth: Payer: Self-pay

## 2015-08-18 ENCOUNTER — Encounter: Payer: Self-pay | Admitting: *Deleted

## 2015-08-18 NOTE — Telephone Encounter (Signed)
Transitional Care Clinic Post-discharge Follow-Up Phone Call:  Date of Discharge: 08/17/15 Principal Discharge Diagnosis(es): Sepsis due to prostate abscess Post-discharge Communication: Attempt #1 to reach patient and complete discharge phone call. Call placed to #[phone]. Unable to reach patient; voicemail left requesting return call. Call Completed: No

## 2015-08-18 NOTE — Telephone Encounter (Signed)
Error

## 2015-08-18 NOTE — Telephone Encounter (Signed)
Transitional Care Clinic Post-discharge Follow-Up Phone Call:  Date of Discharge: 08/17/15 Principal Discharge Diagnosis(es): Sepsis due to prostate abscess  Post-discharge Communication: Attempt #2 to reach patient and complete post-discharge phone call. Call placed to #[phone]; unable to reach patient. Voicemail left requesting return call. Call completed: No

## 2015-08-19 NOTE — Discharge Summary (Signed)
Physician Discharge Summary  Rickey Smith GNF:[id number] DOB: [birth year] DOA: 08/11/2015  PCP: No PCP Per Patient  Admit date: 08/11/2015 Discharge date: 08/17/2015  Time spent: 25  minutes  Recommendations for Outpatient Follow-up:  1. Follow up in transitional clinic 2. Follow up with urology 3. Finish abx   Discharge Diagnoses:  Principal Problem:   Prostate abscess Active Problems:   Leukocytosis   GERD (gastroesophageal reflux disease)   Tachycardia   Diabetic neuropathy (HCC)   UTI (lower urinary tract infection)   Sepsis (HCC)   Discharge Condition: improved  Diet recommendation: carb mod  Filed Weights   08/11/15 1952 08/15/15 2209  Weight: 71.804 kg (158 lb 4.8 oz) 70.4 kg (155 lb 3.3 oz)    History of present illness:  Rickey Smith is a 50 y.o. male with a past medical history that includes hypertension, diabetes, with recent hospitalization for SIRS due to dehydration associated with hyperglycemia and hyperosmolarity presents to the emergency department with the chief complaint hematuria dysuria bilateral flank pain. Initial evaluation reveals a urinary tract infection and CT of the abdomen pelvis concerning for prostate abscess, leukocytosis and tachycardia   Information obtained from the patient. He reports about a week ago he developed mild to moderate pressure in his suprapubic area feeling like "always had the p.m.". He was seen by care diagnosed with urinary tract infection and provided with antibiotics. He was unable to get the antibiotics filled. The pain increased over time. Since he had symptoms include dysuria and hematuria. In addition he developed pain scrotal area worse with urination. He reports the pain became so intense he was pacing the floor. Denies chest pain palpitation headache dizziness syncope or near-syncope. He denies a fever chills abdominal pain nausea or vomiting or diarrhea.  Emergency department he is afebrile hemodynamically  stable mildly tachycardic not hypoxic he is provided with IV fluids Rocephin and Zosyn  Hospital Course:  Sepsis due Prostate abscess: Repeat CT scan show a large abscess, s/p surgery on 08/16/2015. MRSA: change to bactrim DS BID x 10 days HIV negative  Tachycardia: Likely due to pain Now Resolved.  Uncontrolled Diabetic neuropathy (HCC): A1c was 10.5. Resume home meds with log of blood sugars for adjustment  Cocaine abuse +2/17  Procedures: TUR unroofing of prostate abscess   Consultations:  urology  Discharge Exam: Filed Vitals:   08/17/15 0528 08/17/15 1002  BP: 107/67 113/69  Pulse: 85 105  Temp: 98.1 F (36.7 C)   Resp: 14       Discharge Instructions   Discharge Instructions    Diet Carb Modified    Complete by:  As directed      Discharge instructions    Complete by:  As directed   Resume home diabetic regimen -check blood sugars and bring to PCP     Increase activity slowly    Complete by:  As directed           Discharge Medication List as of 08/17/2015 12:34 PM    START taking these medications   Details  HYDROcodone-acetaminophen (NORCO/VICODIN) 5-325 MG tablet Take 1-2 tablets by mouth every 4 (four) hours as needed for moderate pain., Starting 08/17/2015, Until Discontinued, Print    sulfamethoxazole-trimethoprim (BACTRIM DS,SEPTRA DS) 800-160 MG tablet Take 1 tablet by mouth every 12 (twelve) hours., Starting 08/17/2015, Until Discontinued, Print      CONTINUE these medications which have CHANGED   Details  insulin aspart protamine- aspart (NOVOLOG MIX 70/30) (70-30) 100 UNIT/ML injection Inject  0.45 mLs (45 Units total) into the skin 2 (two) times daily with a meal., Starting 08/17/2015, Until Discontinued, Print      CONTINUE these medications which have NOT CHANGED   Details  amitriptyline (ELAVIL) 75 MG tablet Take 1 tablet (75 mg total) by mouth at bedtime., Starting 08/10/2015, Until Discontinued, Normal    atorvastatin (LIPITOR)  40 MG tablet Take 1 tablet (40 mg total) by mouth daily at 6 PM., Starting 10/26/2014, Until Discontinued, Print    metFORMIN (GLUCOPHAGE) 500 MG tablet Take 500 mg by mouth daily with breakfast., Until Discontinued, Historical Med    metoprolol tartrate (LOPRESSOR) 25 MG tablet Take 1 tablet (25 mg total) by mouth 2 (two) times daily., Starting 08/10/2015, Until Discontinued, Normal    ondansetron (ZOFRAN) 4 MG tablet Take 1 tablet (4 mg total) by mouth every 6 (six) hours., Starting 10/26/2014, Until Discontinued, Print      STOP taking these medications     ciprofloxacin (CIPRO) 500 MG tablet      lisinopril (PRINIVIL,ZESTRIL) 2.5 MG tablet        Allergies  Allergen Reactions  . Codeine Itching  . Gabapentin Diarrhea and Nausea And Vomiting   Follow-up Information    Follow up with [facility] On 08/22/2015.   Why:  Transitional Care Clinic appointment on 08/22/15 at 2:30 pm with Dr. Venetia Night.   Contact information:   [address] [place] [address] [phone]      Follow up with [facility].   Why:  [facility]   Contact information:   [address] [place] [address] [phone]        The results of significant diagnostics from this hospitalization (including imaging, microbiology, ancillary and laboratory) are listed below for reference.    Significant Diagnostic Studies: Dg Abd 1 View  08/03/2015  CLINICAL DATA:  Low abdomen pain for 2 hours EXAM: ABDOMEN - 1 VIEW COMPARISON:  None. FINDINGS: There is no bowel obstruction. Extensive bowel content is identified throughout colon. Degenerative joint changes of the spine are noted. IMPRESSION: No bowel obstruction. Constipation. Electronically Signed   By: Sherian Rein M.D.   On: 08/03/2015 19:20   Ct Angio Chest Pe W/cm &/or Wo Cm  08/02/2015  CLINICAL DATA:  Mid chest pain. EXAM: CT ANGIOGRAPHY CHEST WITH CONTRAST TECHNIQUE: Multidetector CT  imaging of the chest was performed using the standard protocol during bolus administration of intravenous contrast. Multiplanar CT image reconstructions and MIPs were obtained to evaluate the vascular anatomy. CONTRAST:  OMNIPAQUE IOHEXOL 350 MG/ML SOLN COMPARISON:  CT of the chest 01/25/2008 FINDINGS: Mediastinum/Lymph Nodes: No pulmonary emboli or thoracic aortic dissection identified. No masses or pathologically enlarged lymph nodes identified. The heart is normal in size. There is no pericardial effusion. Lungs/Pleura: No pulmonary mass, infiltrate, or effusion. There is moderate upper lobe predominant paraseptal emphysema. Bibasilar hypoventilatory changes are seen. Upper abdomen: No acute findings. Musculoskeletal: No chest wall mass or suspicious bone lesions identified. Review of the MIP images confirms the above findings. IMPRESSION: No evidence of pulmonary embolus or thoracic dissection. Upper lobe predominant emphysematous changes of the lungs, without evidence of acute process. Electronically Signed   By: Ted Mcalpine M.D.   On: 08/02/2015 16:40   Ct Abdomen Pelvis W Contrast  08/14/2015  CLINICAL DATA:  Pelvic pain for the last 2 weeks.  Prostate abscess. EXAM: CT ABDOMEN AND PELVIS WITH CONTRAST TECHNIQUE: Multidetector CT imaging of the abdomen  and pelvis was performed using the standard protocol following bolus administration of intravenous contrast. CONTRAST:  OMNIPAQUE IOHEXOL 300 MG/ML  SOLN COMPARISON:  08/11/2015 FINDINGS: Dependent atelectasis in the lung bases bilaterally. No pleural effusions. Heart is normal size. Liver, gallbladder, spleen, pancreas, adrenals and kidneys are normal. Fluid collection in the prostate is better defined with enhancing walls and has enlarged, measuring 4.3 x 2.8 cm. This extends to and possibly through the posterior wall of the prostate. Foley catheter is present in the bladder which is decompressed. Bowel grossly unremarkable. No free  fluid, free air, or adenopathy. Aorta is normal caliber. No acute bony abnormality or focal bone lesion. IMPRESSION: Enlargement and better definition of the prostate abscess as described above. This extends to and possibly through the posterior aspect of the prostate. Bibasilar atelectasis. Electronically Signed   By: Charlett Nose M.D.   On: 08/14/2015 09:00   Dg Chest Port 1 View  08/02/2015  CLINICAL DATA:  EPIGASTRIC PAIN/MID CHEST,HIGH BLOOD SUGAR,HX DM.HIGH HEART RATE EXAM: PORTABLE CHEST 1 VIEW COMPARISON:  07/31/2014 FINDINGS: Normal mediastinum and cardiac silhouette. Normal pulmonary vasculature. No evidence of effusion, infiltrate, or pneumothorax. No acute bony abnormality. IMPRESSION: No acute cardiopulmonary process. Electronically Signed   By: Genevive Bi M.D.   On: 08/02/2015 13:51   Ct Renal Stone Study  08/11/2015  CLINICAL DATA:  Bilateral flank pain for 1 week that has worsened. Difficulty urinating. History also mentions hematuria. EXAM: CT ABDOMEN AND PELVIS WITHOUT CONTRAST TECHNIQUE: Multidetector CT imaging of the abdomen and pelvis was performed following the standard protocol without IV contrast. COMPARISON:  None. FINDINGS: Lung bases: Minor dependent subsegmental atelectasis. Small nodules, 3 in right lower lobe and 1 peripheral right middle lobe, which is the largest measuring 5 mm. Heart is normal in size. At liver, spleen, gallbladder, pancreas:  Normal. Adrenal glands: 2 cm left adrenal adenoma. Normal right adrenal gland. Kidneys, ureters, bladder: Normal kidneys in ureters. No stones or obstructive uropathy. Bladder is mostly decompressed with a Foley catheter. Wall may be mildly thickened. Reproductive: Prostate is enlarged and heterogeneous. There is a 3 x 2.9 x 2.4 cm irregular area of low attenuation within the right aspect the prostate suggesting an abscess. There is mild surrounding hazy inflammatory change and fat stranding extending around the seminal vesicles  posterior bladder. No enlarged lymph nodes. Ascites:  None. Gastrointestinal: Stomach and small bowel are unremarkable. There is mild increased stool throughout the colon. No other colon abnormality. Normal appendix is visualized. There is some mild hazy opacity along posterior margin of the cecum consistent with inflammatory changes arising from the pelvis. Musculoskeletal: Degenerative changes of the lower lumbar spine. No osteoblastic or osteolytic lesions. IMPRESSION: 1. 3 cm irregular low-attenuation area within the prostate consistent with an abscess. There is surrounding inflammatory changes. The bladder appears walled, which is likely reactive, but could reflect infectious cystitis. 2. No other acute findings. 3. Normal kidneys in ureters. 4. Normal appendix. 5. Mild increased stool throughout the colon. Electronically Signed   By: Amie Portland M.D.   On: 08/11/2015 13:39    Microbiology: Recent Results (from the past 240 hour(s))  Gram stain     Status: None   Collection Time: 08/11/15 11:53 AM  Result Value Ref Range Status   Specimen Description URINE, CLEAN CATCH  Final   Special Requests Normal  Final   Gram Stain   Final    ABUNDANT WBC PRESENT, PREDOMINANTLY PMN ABUNDANT GRAM POSITIVE COCCI IN CLUSTERS  Report Status 08/11/2015 FINAL  Final  Culture, Urine     Status: None   Collection Time: 08/11/15 11:53 AM  Result Value Ref Range Status   Specimen Description URINE, CATHETERIZED  Final   Special Requests NONE  Final   Culture   Final    >=100,000 COLONIES/mL METHICILLIN RESISTANT STAPHYLOCOCCUS AUREUS   Report Status 08/14/2015 FINAL  Final   Organism ID, Bacteria METHICILLIN RESISTANT STAPHYLOCOCCUS AUREUS  Final      Susceptibility   Methicillin resistant staphylococcus aureus - MIC*    CIPROFLOXACIN <=0.5 SENSITIVE Sensitive     GENTAMICIN <=0.5 SENSITIVE Sensitive     NITROFURANTOIN <=16 SENSITIVE Sensitive     OXACILLIN >=4 RESISTANT Resistant     TETRACYCLINE  <=1 SENSITIVE Sensitive     VANCOMYCIN 1 SENSITIVE Sensitive     TRIMETH/SULFA <=10 SENSITIVE Sensitive     CLINDAMYCIN <=0.25 SENSITIVE Sensitive     RIFAMPIN <=0.5 SENSITIVE Sensitive     Inducible Clindamycin NEGATIVE Sensitive     * >=100,000 COLONIES/mL METHICILLIN RESISTANT STAPHYLOCOCCUS AUREUS  Blood culture (routine x 2)     Status: None   Collection Time: 08/11/15  2:17 PM  Result Value Ref Range Status   Specimen Description BLOOD LEFT ANTECUBITAL  Final   Special Requests BOTTLES DRAWN AEROBIC AND ANAEROBIC 5CC  Final   Culture NO GROWTH 5 DAYS  Final   Report Status 08/16/2015 FINAL  Final  Blood culture (routine x 2)     Status: None   Collection Time: 08/11/15  2:21 PM  Result Value Ref Range Status   Specimen Description BLOOD LEFT FOREARM  Final   Special Requests BOTTLES DRAWN AEROBIC AND ANAEROBIC 5CCS  Final   Culture NO GROWTH 5 DAYS  Final   Report Status 08/16/2015 FINAL  Final  Surgical PCR screen     Status: None   Collection Time: 08/16/15  5:43 AM  Result Value Ref Range Status   MRSA, PCR NEGATIVE NEGATIVE Final   Staphylococcus aureus NEGATIVE NEGATIVE Final    Comment:        The Xpert SA Assay (FDA approved for NASAL specimens in patients over 59 years of age), is one component of a comprehensive surveillance program.  Test performance has been validated by [facility] for patients greater than or equal to 95 year old. It is not intended to diagnose infection nor to guide or monitor treatment.      Labs: Basic Metabolic Panel:  Recent Labs Lab 08/16/15 1139 08/17/15 0459  NA 136 141  K 4.5 4.3  CL 99* 101  CO2 28 32  GLUCOSE 154* 182*  BUN 10 11  CREATININE 0.84 0.81  CALCIUM 8.7* 9.3   Liver Function Tests: No results for input(s): AST, ALT, ALKPHOS, BILITOT, PROT, ALBUMIN in the last 168 hours. No results for input(s): LIPASE, AMYLASE in the last 168 hours. No results for input(s): AMMONIA in the last 168  hours. CBC:  Recent Labs Lab 08/17/15 0459  WBC 10.0  HGB 11.1*  HCT 34.8*  MCV 89.0  PLT 351   Cardiac Enzymes: No results for input(s): CKTOTAL, CKMB, CKMBINDEX, TROPONINI in the last 168 hours. BNP: BNP (last 3 results)  Recent Labs  08/02/15 1306  BNP 30.1    ProBNP (last 3 results) No results for input(s): PROBNP in the last 8760 hours.  CBG:  Recent Labs Lab 08/16/15 1655 08/16/15 2140 08/16/15 2213 08/17/15 0726 08/17/15 1149  GLUCAP 461* 57* 80 181* 316*  Signed:  Joseph Art MD.  [facility] 08/19/2015, 3:42 PM

## 2015-08-21 ENCOUNTER — Telehealth: Payer: Self-pay | Admitting: *Deleted

## 2015-08-21 ENCOUNTER — Telehealth: Payer: Self-pay

## 2015-08-21 NOTE — Telephone Encounter (Signed)
-----   Message from Jaclyn Shaggy, MD sent at 08/21/2015 12:35 PM EST ----- Labs reveal he does have type 1 diabetes mellitus.

## 2015-08-21 NOTE — Telephone Encounter (Signed)
Transitional Care Clinic Post-discharge Follow-Up Phone Call:  Attempt # 3  Date of Discharge: 08/17/2015 Principal Discharge Diagnosis(es): prostate abscess, diabetic neuropathy Post-discharge Communication: (Clearly document all attempts clearly and date contact made)  Call placed to # [phone] and a HIPAA compliant voice mail message was left requesting a call back to # [phone] or [phone].  Call Completed: No

## 2015-08-21 NOTE — Telephone Encounter (Signed)
Attempted to contact patient regarding his lab results but his phone voicemail is full and no messages can be left.  Letter sent asking patient to call clinic.

## 2015-08-22 ENCOUNTER — Ambulatory Visit: Payer: Self-pay | Attending: Family Medicine | Admitting: Family Medicine

## 2015-08-22 ENCOUNTER — Encounter: Payer: Self-pay | Admitting: Family Medicine

## 2015-08-22 VITALS — BP 120/83 | HR 126 | Temp 98.0°F | Resp 15 | Ht 73.0 in | Wt 155.2 lb

## 2015-08-22 DIAGNOSIS — Z7984 Long term (current) use of oral hypoglycemic drugs: Secondary | ICD-10-CM | POA: Insufficient documentation

## 2015-08-22 DIAGNOSIS — R000 Tachycardia, unspecified: Secondary | ICD-10-CM | POA: Insufficient documentation

## 2015-08-22 DIAGNOSIS — E1040 Type 1 diabetes mellitus with diabetic neuropathy, unspecified: Secondary | ICD-10-CM | POA: Insufficient documentation

## 2015-08-22 DIAGNOSIS — R300 Dysuria: Secondary | ICD-10-CM | POA: Insufficient documentation

## 2015-08-22 DIAGNOSIS — Z9889 Other specified postprocedural states: Secondary | ICD-10-CM | POA: Insufficient documentation

## 2015-08-22 DIAGNOSIS — Z794 Long term (current) use of insulin: Secondary | ICD-10-CM | POA: Insufficient documentation

## 2015-08-22 DIAGNOSIS — E109 Type 1 diabetes mellitus without complications: Secondary | ICD-10-CM

## 2015-08-22 DIAGNOSIS — Z885 Allergy status to narcotic agent status: Secondary | ICD-10-CM | POA: Insufficient documentation

## 2015-08-22 DIAGNOSIS — Z888 Allergy status to other drugs, medicaments and biological substances status: Secondary | ICD-10-CM | POA: Insufficient documentation

## 2015-08-22 DIAGNOSIS — E1149 Type 2 diabetes mellitus with other diabetic neurological complication: Secondary | ICD-10-CM

## 2015-08-22 DIAGNOSIS — N412 Abscess of prostate: Secondary | ICD-10-CM | POA: Insufficient documentation

## 2015-08-22 DIAGNOSIS — E785 Hyperlipidemia, unspecified: Secondary | ICD-10-CM | POA: Insufficient documentation

## 2015-08-22 DIAGNOSIS — I10 Essential (primary) hypertension: Secondary | ICD-10-CM | POA: Insufficient documentation

## 2015-08-22 DIAGNOSIS — E1065 Type 1 diabetes mellitus with hyperglycemia: Secondary | ICD-10-CM | POA: Insufficient documentation

## 2015-08-22 LAB — CBC WITH DIFFERENTIAL/PLATELET
BASOS PCT: 1 % (ref 0–1)
Basophils Absolute: 0.1 10*3/uL (ref 0.0–0.1)
EOS PCT: 3 % (ref 0–5)
Eosinophils Absolute: 0.3 10*3/uL (ref 0.0–0.7)
HEMATOCRIT: 39.5 % (ref 39.0–52.0)
Hemoglobin: 13.1 g/dL (ref 13.0–17.0)
Lymphocytes Relative: 17 % (ref 12–46)
Lymphs Abs: 1.7 10*3/uL (ref 0.7–4.0)
MCH: 29 pg (ref 26.0–34.0)
MCHC: 33.2 g/dL (ref 30.0–36.0)
MCV: 87.4 fL (ref 78.0–100.0)
MONO ABS: 0.6 10*3/uL (ref 0.1–1.0)
MONOS PCT: 6 % (ref 3–12)
MPV: 10.7 fL (ref 8.6–12.4)
Neutro Abs: 7.3 10*3/uL (ref 1.7–7.7)
Neutrophils Relative %: 73 % (ref 43–77)
Platelets: 422 10*3/uL — ABNORMAL HIGH (ref 150–400)
RBC: 4.52 MIL/uL (ref 4.22–5.81)
RDW: 13.2 % (ref 11.5–15.5)
WBC: 10 10*3/uL (ref 4.0–10.5)

## 2015-08-22 LAB — GLUCOSE, POCT (MANUAL RESULT ENTRY): POC GLUCOSE: 393 mg/dL — AB (ref 70–99)

## 2015-08-22 MED ORDER — HYDROCODONE-ACETAMINOPHEN 5-325 MG PO TABS: 1.0000 | ORAL_TABLET | ORAL | Status: DC | PRN

## 2015-08-22 NOTE — Progress Notes (Signed)
Patient states he is doing better since being in hospital He states he is almost out of his pain medicine and that his amitriptyline is not working like it used to He took his 70/30 this am but has not taken any Novolog today

## 2015-08-22 NOTE — Progress Notes (Signed)
TRANSITIONAL CARE CLINIC  Dates of telephone encounters : 08/07/15, 08/18/15  Hospitalization dates: 08/02/15- 08/04/15 and 08/11/15-08/17/15   PCP: None   Subjective:    Patient ID: Rickey Smith, male    DOB: 1966/03/26, 50 y.o.   MRN: [id number]  HPI  50 year old male with a history of type 2 diabetes mellitus (A1c 10.5), diabetic neuropathy, hypertension, hyperlipidemia recently hospitalized for a prostate abscess.  He had presented with dysuria, lower abdominal pain, flank pain and urinary retention and had recently received a prescription for Cipro for UTI from the clinic which he had not picked up yet. He had leukocytosis of 25.2 and was tachycardic; was commenced on IV Rocephin and Zosyn, CT renal stone protocol revealed prostate abscess and surrounding inflammatory changes. Urology consult was placed and he underwent TUR unroofing of prostate abscess. His condition improved and he was subsequently discharged on Bactrim.  Prior to this hospitalization he was admitted for SIRS secondary to nonketotic hyperosmolality.  Today he reports some residual dysuria and is currently on his Bactrim prescription. He does not have a follow-up appointment with the urologist. His blood sugar is 293 in the clinic and he states he just had a meal 30 minutes ago.  Past Medical History  Diagnosis Date  . Diabetes mellitus without complication [facility])     Past Surgical History  Procedure Laterality Date  . Transurethral resection of prostate N/A 08/16/2015    Procedure: TRANSURETHRAL RESECTION DRAINAGE OF PROSTATE ABCESS;  Surgeon: Barron Alvine, MD;  Location: [facility];  Service: Urology;  Laterality: N/A;    Allergies  Allergen Reactions  . Codeine Itching  . Gabapentin Diarrhea and Nausea And Vomiting    Current Outpatient Prescriptions on File Prior to Visit  Medication Sig Dispense Refill  . amitriptyline (ELAVIL) 75 MG tablet Take 1 tablet (75 mg total) by mouth at bedtime. 30 tablet 2  .  atorvastatin (LIPITOR) 40 MG tablet Take 1 tablet (40 mg total) by mouth daily at 6 PM. 30 tablet 1  . insulin aspart protamine- aspart (NOVOLOG MIX 70/30) (70-30) 100 UNIT/ML injection Inject 0.45 mLs (45 Units total) into the skin 2 (two) times daily with a meal. 10 mL 11  . metFORMIN (GLUCOPHAGE) 500 MG tablet Take 500 mg by mouth daily with breakfast.    . metoprolol tartrate (LOPRESSOR) 25 MG tablet Take 1 tablet (25 mg total) by mouth 2 (two) times daily. 60 tablet 2  . ondansetron (ZOFRAN) 4 MG tablet Take 1 tablet (4 mg total) by mouth every 6 (six) hours. 15 tablet 0  . sulfamethoxazole-trimethoprim (BACTRIM DS,SEPTRA DS) 800-160 MG tablet Take 1 tablet by mouth every 12 (twelve) hours. 20 tablet 0   No current facility-administered medications on file prior to visit.      Review of Systems  Constitutional: Negative for activity change and appetite change.  HENT: Negative for sinus pressure and sore throat.   Eyes: Negative for visual disturbance.  Respiratory: Negative for cough, chest tightness and shortness of breath.   Cardiovascular: Negative for chest pain and leg swelling.  Gastrointestinal: Negative for abdominal pain, diarrhea, constipation and abdominal distention.  Endocrine: Negative.   Genitourinary: Positive for dysuria.  Musculoskeletal: Negative for myalgias and joint swelling.  Skin: Negative for rash.  Allergic/Immunologic: Negative.   Neurological: Negative for weakness, light-headedness and numbness.  Psychiatric/Behavioral: Negative for suicidal ideas and dysphoric mood.   CLINICAL DATA: Bilateral flank pain for 1 week that has worsened. Difficulty urinating. History also mentions hematuria.  EXAM:  CT ABDOMEN AND PELVIS WITHOUT CONTRAST  TECHNIQUE: Multidetector CT imaging of the abdomen and pelvis was performed following the standard protocol without IV contrast.  COMPARISON: None.  FINDINGS: Lung bases: Minor dependent subsegmental  atelectasis. Small nodules, 3 in right lower lobe and 1 peripheral right middle lobe, which is the largest measuring 5 mm. Heart is normal in size.  At liver, spleen, gallbladder, pancreas: Normal.  Adrenal glands: 2 cm left adrenal adenoma. Normal right adrenal gland.  Kidneys, ureters, bladder: Normal kidneys in ureters. No stones or obstructive uropathy.  Bladder is mostly decompressed with a Foley catheter. Wall may be mildly thickened.  Reproductive: Prostate is enlarged and heterogeneous. There is a 3 x 2.9 x 2.4 cm irregular area of low attenuation within the right aspect the prostate suggesting an abscess. There is mild surrounding hazy inflammatory change and fat stranding extending around the seminal vesicles posterior bladder.  No enlarged lymph nodes.  Ascites: None.  Gastrointestinal: Stomach and small bowel are unremarkable. There is mild increased stool throughout the colon. No other colon abnormality. Normal appendix is visualized. There is some mild hazy opacity along posterior margin of the cecum consistent with inflammatory changes arising from the pelvis.  Musculoskeletal: Degenerative changes of the lower lumbar spine. No osteoblastic or osteolytic lesions.  IMPRESSION: 1. 3 cm irregular low-attenuation area within the prostate consistent with an abscess. There is surrounding inflammatory changes. The bladder appears walled, which is likely reactive, but could reflect infectious cystitis. 2. No other acute findings. 3. Normal kidneys in ureters. 4. Normal appendix. 5. Mild increased stool throughout the colon.   Electronically Signed  By: Amie Portland M.D.  On: 08/11/2015 13:39    Objective: Filed Vitals:   08/22/15 1424 08/22/15 1455  BP: 120/83   Pulse: 136 126  Temp: 98 F (36.7 C)   Resp: 15   Height: 6\' 1"  (1.854 m)   Weight: 155 lb 3.2 oz (70.398 kg)   SpO2: 97%       Physical Exam  Constitutional: He is  oriented to person, place, and time. He appears well-developed and well-nourished.  Cardiovascular: Normal heart sounds and intact distal pulses.  Tachycardia present.   No murmur heard. Pulmonary/Chest: Effort normal and breath sounds normal. He has no wheezes. He has no rales. He exhibits no tenderness.  Abdominal: Soft. Bowel sounds are normal. He exhibits no distension and no mass. There is no tenderness.  Musculoskeletal: Normal range of motion.  Neurological: He is alert and oriented to person, place, and time.          Assessment & Plan:  Prostate abscess -Status post unroofing of the prostate. -Continue Bactrim -Given few prescriptions for hydrocodone for pain; I have discussed this UDS which was positive for cocaine and he adamantly denies using this. -Does not have an appointment with urology and so I have placed a referral (he has been advised to walk-in for appointment. [place] discount to expedite referral process)  Type 1 diabetes mellitus -Uncontrolled with A1c of 10.5 -CBG of 393 due to the fact the patient ate 30 minutes ago -Reports fasting sugars are in the 150s and so I will make no regimen changes but we will see a blood sugar log at next visit. And continue metformin and NovoLog 70/30 Keep blood sugar logs with fasting goals of 80-120 mg/dl, random of less than 161 and in the event of sugars less than 60 mg/dl or greater than 096 mg/dl please notify the clinic ASAP. It is  recommended that you undergo annual eye exams and annual foot exams. Pneumovax is recommended every 5 years before the age of 56 and once for a lifetime at or after the age of 73.  Tachycardia: Repeat heart rate 126 Unknown etiology, TSH normal

## 2015-08-22 NOTE — Patient Instructions (Signed)
Diabetes Mellitus and Food It is important for you to manage your blood sugar (glucose) level. Your blood glucose level can be greatly affected by what you eat. Eating healthier foods in the appropriate amounts throughout the day at about the same time each day will help you control your blood glucose level. It can also help slow or prevent worsening of your diabetes mellitus. Healthy eating may even help you improve the level of your blood pressure and reach or maintain a healthy weight.  General recommendations for healthful eating and cooking habits include:  Eating meals and snacks regularly. Avoid going long periods of time without eating to lose weight.  Eating a diet that consists mainly of plant-based foods, such as fruits, vegetables, nuts, legumes, and whole grains.  Using low-heat cooking methods, such as baking, instead of high-heat cooking methods, such as deep frying. Work with your dietitian to make sure you understand how to use the Nutrition Facts information on food labels. HOW CAN FOOD AFFECT ME? Carbohydrates Carbohydrates affect your blood glucose level more than any other type of food. Your dietitian will help you determine how many carbohydrates to eat at each meal and teach you how to count carbohydrates. Counting carbohydrates is important to keep your blood glucose at a healthy level, especially if you are using insulin or taking certain medicines for diabetes mellitus. Alcohol Alcohol can cause sudden decreases in blood glucose (hypoglycemia), especially if you use insulin or take certain medicines for diabetes mellitus. Hypoglycemia can be a life-threatening condition. Symptoms of hypoglycemia (sleepiness, dizziness, and disorientation) are similar to symptoms of having too much alcohol.  If your health care provider has given you approval to drink alcohol, do so in moderation and use the following guidelines:  Women should not have more than one drink per day, and men  should not have more than two drinks per day. One drink is equal to:  12 oz of beer.  5 oz of wine.  1 oz of hard liquor.  Do not drink on an empty stomach.  Keep yourself hydrated. Have water, diet soda, or unsweetened iced tea.  Regular soda, juice, and other mixers might contain a lot of carbohydrates and should be counted. WHAT FOODS ARE NOT RECOMMENDED? As you make food choices, it is important to remember that all foods are not the same. Some foods have fewer nutrients per serving than other foods, even though they might have the same number of calories or carbohydrates. It is difficult to get your body what it needs when you eat foods with fewer nutrients. Examples of foods that you should avoid that are high in calories and carbohydrates but low in nutrients include:  Trans fats (most processed foods list trans fats on the Nutrition Facts label).  Regular soda.  Juice.  Candy.  Sweets, such as cake, pie, doughnuts, and cookies.  Fried foods. WHAT FOODS CAN I EAT? Eat nutrient-rich foods, which will nourish your body and keep you healthy. The food you should eat also will depend on several factors, including:  The calories you need.  The medicines you take.  Your weight.  Your blood glucose level.  Your blood pressure level.  Your cholesterol level. You should eat a variety of foods, including:  Protein.  Lean cuts of meat.  Proteins low in saturated fats, such as fish, egg whites, and beans. Avoid processed meats.  Fruits and vegetables.  Fruits and vegetables that may help control blood glucose levels, such as apples, mangoes, and   yams.  Dairy products.  Choose fat-free or low-fat dairy products, such as milk, yogurt, and cheese.  Grains, bread, pasta, and rice.  Choose whole grain products, such as multigrain bread, whole oats, and brown rice. These foods may help control blood pressure.  Fats.  Foods containing healthful fats, such as nuts,  avocado, olive oil, canola oil, and fish. DOES EVERYONE WITH DIABETES MELLITUS HAVE THE SAME MEAL PLAN? Because every person with diabetes mellitus is different, there is not one meal plan that works for everyone. It is very important that you meet with a dietitian who will help you create a meal plan that is just right for you.   This information is not intended to replace advice given to you by your health care provider. Make sure you discuss any questions you have with your health care provider.   Document Released: 03/07/2005 Document Revised: 07/01/2014 Document Reviewed: 05/07/2013 Elsevier Interactive Patient Education 2016 Elsevier Inc.  

## 2015-08-23 ENCOUNTER — Telehealth: Payer: Self-pay | Admitting: *Deleted

## 2015-08-23 ENCOUNTER — Telehealth: Payer: Self-pay

## 2015-08-23 NOTE — Telephone Encounter (Signed)
-----   Message from Jaclyn Shaggy, MD sent at 08/23/2015  8:42 AM EST ----- Leukocytosis have resolved which is an evidence of resolving infection.

## 2015-08-23 NOTE — Telephone Encounter (Signed)
Dr. Venetia Night received communication from [facility] that they have been trying to reach patient since 08/17/15 to initiate start of care; however, patient not answering phone calls or his door. Dr. Venetia Night also indicated patient needs to meet with Financial Counselor as soon as possible to determine if eligible for [facility] Program or [employer] as he needs to follow-up with Urology. Attempted to reach patient at [phone] to discuss importance of calling [facility] so his home health services can be initiated and to discuss importance of meeting with Artist. Unable to reach patient; voicemail left requesting return call.

## 2015-08-23 NOTE — Telephone Encounter (Signed)
Gave results 

## 2015-08-24 ENCOUNTER — Telehealth: Payer: Self-pay

## 2015-08-24 NOTE — Telephone Encounter (Signed)
Dr. Venetia Night received communication from [facility] that they have been trying to reach patient since 08/17/15 to initiate start of care; however, patient not answering phone calls or his door. Dr. Venetia Night also indicated patient needs to meet with Financial Counselor as soon as possible to determine if eligible for [facility] Program or [employer] as he needs to follow-up with Urology. Attempted to reach patient at [phone] once again to discuss importance of calling [facility] so his home health services can be initiated and to discuss importance of meeting with Artist. Unable to reach patient; voicemail left requesting return call. In addition, call placed to patient's emergency contact, Kydan Shanholtzer (mother), at [phone]. HIPPA compliant message left with patient's mother requesting patient return call to this Case Manager.  Patient's mother indicated she would pass along message to patient. Patient's mother also indicated patient planned to walk-in tomorrow to meet with Financial Counselor. Awaiting return call from patient.

## 2015-08-25 NOTE — Progress Notes (Signed)
Patient arrived to clinic to meet with Financial Counselor.  This Case Manager informed him that [facility] RN had been trying to reach him to initiate home health services.  Patient indicated he had been "tied up" recently and had not been able to return calls.  Informed patient he should contact [facility] as soon as possible to arrange start of care. Patient verbalized understanding and indicated he had [facility] contact information. No additional needs identified at this time.

## 2015-08-28 ENCOUNTER — Telehealth: Payer: Self-pay

## 2015-08-28 NOTE — Telephone Encounter (Signed)
Attempted to contact the patient to check on his status and to inquire if [facility] has been to his house to see him. Call placed to # [phone] and a  HIPAA compliant voice mail message was left requesting a call back to # [phone] or [phone].

## 2015-08-29 ENCOUNTER — Telehealth: Payer: Self-pay

## 2015-08-29 NOTE — Telephone Encounter (Signed)
Call placed to [facility] # [phone] to inquire if they have been able to contact the patient. [place] to [place] who stated that the patient has been discharged because they have not been able to reach him.  They even had a nurse go to the home and the patient did not answer the door.   Call then placed to the patient # [phone] to check on his status and to discuss scheduling a follow up appointment. A HIPAA compliant voice mail message was left requesting a call back to # [phone] or [phone].

## 2015-08-31 ENCOUNTER — Telehealth: Payer: Self-pay

## 2015-08-31 NOTE — Telephone Encounter (Signed)
This Case Manager placed call to patient to check on status and to discuss scheduling follow-up appointment with Dr. Venetia NightAmao.  Call placed to #[phone]. Unable to reach patient; voicemail left requesting return call.

## 2015-09-05 ENCOUNTER — Telehealth: Payer: Self-pay

## 2015-09-05 NOTE — Telephone Encounter (Signed)
Attempted to contact the patient to check on his status and to discuss scheduling a follow up appointment. Call placed to # [phone] (M) and a HIPAA compliant voice mail message was left requesting a call back to # [phone] or [phone].

## 2015-09-06 ENCOUNTER — Telehealth: Payer: Self-pay

## 2015-09-06 NOTE — Telephone Encounter (Signed)
This Case Manager placed an additional call to patient to check on status and to discuss scheduling follow-up appointment with Dr. Venetia NightAmao. Call placed to #[phone]. Unable to reach patient; HIPPA compliant voicemail left for patient requesting return call.

## 2015-09-13 ENCOUNTER — Ambulatory Visit: Payer: Self-pay | Attending: Internal Medicine | Admitting: Internal Medicine

## 2015-09-13 ENCOUNTER — Encounter: Payer: Self-pay | Admitting: Internal Medicine

## 2015-09-13 ENCOUNTER — Telehealth: Payer: Self-pay

## 2015-09-13 VITALS — BP 120/80 | HR 107 | Temp 97.6°F | Resp 15 | Ht 73.0 in | Wt 161.0 lb

## 2015-09-13 DIAGNOSIS — Z7984 Long term (current) use of oral hypoglycemic drugs: Secondary | ICD-10-CM | POA: Insufficient documentation

## 2015-09-13 DIAGNOSIS — Z885 Allergy status to narcotic agent status: Secondary | ICD-10-CM | POA: Insufficient documentation

## 2015-09-13 DIAGNOSIS — Z794 Long term (current) use of insulin: Secondary | ICD-10-CM | POA: Insufficient documentation

## 2015-09-13 DIAGNOSIS — M7501 Adhesive capsulitis of right shoulder: Secondary | ICD-10-CM | POA: Insufficient documentation

## 2015-09-13 DIAGNOSIS — M719 Bursopathy, unspecified: Secondary | ICD-10-CM

## 2015-09-13 DIAGNOSIS — M25511 Pain in right shoulder: Secondary | ICD-10-CM | POA: Insufficient documentation

## 2015-09-13 DIAGNOSIS — E1165 Type 2 diabetes mellitus with hyperglycemia: Secondary | ICD-10-CM | POA: Insufficient documentation

## 2015-09-13 DIAGNOSIS — Z888 Allergy status to other drugs, medicaments and biological substances status: Secondary | ICD-10-CM | POA: Insufficient documentation

## 2015-09-13 LAB — GLUCOSE, POCT (MANUAL RESULT ENTRY): POC Glucose: 134 mg/dl — AB (ref 70–99)

## 2015-09-13 MED ORDER — NAPROXEN 500 MG PO TABS: 500.0000 mg | ORAL_TABLET | Freq: Two times a day (BID) | ORAL | Status: DC

## 2015-09-13 MED ORDER — PREGABALIN 100 MG PO CAPS: 100.0000 mg | ORAL_CAPSULE | Freq: Two times a day (BID) | ORAL | Status: DC

## 2015-09-13 MED ORDER — CYCLOBENZAPRINE HCL 5 MG PO TABS: 5.0000 mg | ORAL_TABLET | Freq: Three times a day (TID) | ORAL | Status: DC | PRN

## 2015-09-13 MED ORDER — PREDNISONE 20 MG PO TABS: 20.0000 mg | ORAL_TABLET | Freq: Two times a day (BID) | ORAL | Status: DC

## 2015-09-13 MED FILL — CYCLOBENZAPRINE 5 MG TABLET: 5 | 10 days supply | Qty: 30 | Fill #0

## 2015-09-13 MED FILL — NAPROXEN 500 MG TABLET: 500 | 15 days supply | Qty: 30 | Fill #0

## 2015-09-13 MED FILL — predniSONE 20 MG TABS: 20 | 7 days supply | Qty: 14 | Fill #0

## 2015-09-13 NOTE — Progress Notes (Signed)
Left shoulder pain that "feels like sciatica:" for 1 week

## 2015-09-13 NOTE — Telephone Encounter (Signed)
This Case Manager received incoming call from patient's mother, Rickey Smith [phone]) indicating patient having left arm pain. This Case Manager asked to speak with patient and date of birth verified. Patient indicated he was having pain in his left arm that was shooting from his shoulder to his wrist.  He indicated it felt like someone was "sticking an ice pick in his arm." Patient indicated pain started after discharge from the hospital last month but had worsened in the last 5 days. Patient indicated he was having some difficulty using his arm and denied injury to his left arm. Appointment scheduled today at 1100 to see Dr. Julien NordmannLangeland. Patient appreciative of appointment. No additional needs/concerns identified.

## 2015-09-13 NOTE — Progress Notes (Signed)
Rickey SpillersWilliam Smith, is a 50 y.o. male  ZOX:[id number]  [id number]:[id number]  DOB - 10/02/1965  Chief Complaint  Patient presents with  . Shoulder Pain        Subjective:   Rickey SpillersWilliam Smith is a 50 y.o. male here today for a follow up visit.  Patient was last seen in our clinic on 08/22/2015 for recent hospitalization for a prostate abscess. He presents today with 2 weeks of left shoulder scapular region pain. Initially it was stabbing in nature, left scapula. It started radiating down to his arm and shoulder with noted tingling and numbness at times. It feels like "sciatica" to his shoulders. He thought maybe he slept on it wrong or moved wrong when he was in the hospital recently or but didn't notice it much until few weeks ago.  He denies weakness to the arms, but when he moves it a certain way sometimes it hurts.  He feels like he's been keeping the arm a certain way for so long that his neck muscles feel tight and tense as well.  Patient has No headache, No chest pain, No abdominal pain - No Nausea, No new weakness tingling or numbness, No Cough - SOB.   ALLERGIES: Allergies  Allergen Reactions  . Codeine Itching  . Gabapentin Diarrhea and Nausea And Vomiting    PAST MEDICAL HISTORY: Past Medical History  Diagnosis Date  . Diabetes mellitus without complication (HCC)     MEDICATIONS AT HOME: Prior to Admission medications   Medication Sig Start Date End Date Taking? Authorizing Provider  amitriptyline (ELAVIL) 75 MG tablet Take 1 tablet (75 mg total) by mouth at bedtime. 08/10/15  Yes Jaclyn ShaggyEnobong Amao, MD  atorvastatin (LIPITOR) 40 MG tablet Take 1 tablet (40 mg total) by mouth daily at 6 PM. 10/26/14  Yes Estela Isaiah BlakesY Hernandez Acosta, MD  insulin aspart protamine- aspart (NOVOLOG MIX 70/30) (70-30) 100 UNIT/ML injection Inject 0.45 mLs (45 Units total) into the skin 2 (two) times daily with a meal. 08/17/15  Yes Joseph ArtJessica U Vann, DO  metFORMIN (GLUCOPHAGE) 500 MG tablet Take 500 mg by mouth daily  with breakfast.   Yes Historical Provider, MD  metoprolol tartrate (LOPRESSOR) 25 MG tablet Take 1 tablet (25 mg total) by mouth 2 (two) times daily. 08/10/15  Yes Jaclyn ShaggyEnobong Amao, MD  ondansetron (ZOFRAN) 4 MG tablet Take 1 tablet (4 mg total) by mouth every 6 (six) hours. 10/26/14  Yes Estela Isaiah BlakesY Hernandez Acosta, MD  cyclobenzaprine (FLEXERIL) 5 MG tablet Take 1 tablet (5 mg total) by mouth 3 (three) times daily as needed for muscle spasms. 09/13/15   Pete Glatterawn T Braelee Herrle, MD  naproxen (NAPROSYN) 500 MG tablet Take 1 tablet (500 mg total) by mouth 2 (two) times daily with a meal. 09/13/15   Pete Glatterawn T Algie Westry, MD  predniSONE (DELTASONE) 20 MG tablet Take 1 tablet (20 mg total) by mouth 2 (two) times daily with a meal. 09/13/15   Pete Glatterawn T Kimiye Strathman, MD  pregabalin (LYRICA) 100 MG capsule Take 1 capsule (100 mg total) by mouth 2 (two) times daily. 09/13/15   Pete Glatterawn T Isaul Landi, MD     Objective:   Filed Vitals:   09/13/15 1224  BP: 120/80  Pulse: 107  Temp: 97.6 F (36.4 C)  Resp: 15  Height: 6\' 1"  (1.854 m)  Weight: 161 lb (73.029 kg)  SpO2: 98%    Exam General appearance : Awake, alert, not in any distress. Speech Clear. Not toxic looking. Aaox3, pleasant  HEENT: Atraumatic and Normocephalic,  pupils equal. Poor dentition.   Neck: supple, no JVD. No cervical lymphadenopathy.  Chest:Good air entry bilaterally, no added sounds. CVS: S1 S2 regular, no murmurs/gallups or rubs. Abdomen: Bowel sounds active, Non tender and not distended.   Extremities: B/L Lower Ext shows no edema, both legs are warm to touch.  Left shoulder:. No palpable masses lesions noted. Mildly tender to palpation to the left scapular region. Flexion and extension of the arm does not elicit pain. Internal rotation does elicit some pain to the left scapular region. No areas of edema or erythema noted in the rotator cuff joints. Patient was nontender the rotator cuff as well.  Sensation was intact in bilateral upper extremities. Neurology:  Awake alert, and oriented X 3, CN II-XII grossly intact, Non focal Skin:No Rash  Data Review Lab Results  Component Value Date   HGBA1C 10.5* 08/11/2015   HGBA1C 10.6* 08/02/2015   HGBA1C 11.9* 10/23/2014     Assessment & Plan   1. Type 2 diabetes mellitus with hyperglycemia, with long-term current use of insulin (HCC) - better control on current regimen - Glucose (CBG) 134  2. Frozen shoulder syndrome, right, with probable mild shoulder bursitis - We will try a short course of Flexeril, Naprosyn, steroids, and Lyrica.  -We will follow-up with patient in about 2 weeks to see if improves.  - Recommended warm moist heat as well to 3 times a day to his back, shoulder region due to tensing muscles    Return in about 2 weeks (around 09/27/2015).  The patient was given clear instructions to go to ER or return to medical center if symptoms don't improve, worsen or new problems develop. The patient verbalized understanding. The patient was told to call to get lab results if they haven't heard anything in the next week.    Pete Glatter, MD, MBA/MHA [facility] and [facility] [place], [place] [phone]   09/13/2015, 1:16 PM

## 2015-09-21 ENCOUNTER — Telehealth: Payer: Self-pay

## 2015-09-21 NOTE — Telephone Encounter (Signed)
This Case Manager placed call to patient to check on status and to discuss scheduling follow-up appointment with Dr. Venetia NightAmao. Call placed to #[phone]; unable to reach patient. HIPPA compliant voicemail left requesting return call. In addition, call placed to #[phone]; spoke with patient's mother and HIPPA compliant message left requesting patient return call to this Case Manager.

## 2015-10-24 ENCOUNTER — Encounter (HOSPITAL_COMMUNITY): Payer: Self-pay

## 2015-10-24 ENCOUNTER — Emergency Department (HOSPITAL_COMMUNITY)
Admission: EM | Admit: 2015-10-24 | Discharge: 2015-10-24 | Disposition: A | Discharge status: Home / Self Care | Payer: No Typology Code available for payment source | Attending: Emergency Medicine | Admitting: Emergency Medicine

## 2015-10-24 DIAGNOSIS — Z87891 Personal history of nicotine dependence: Secondary | ICD-10-CM | POA: Insufficient documentation

## 2015-10-24 DIAGNOSIS — E1165 Type 2 diabetes mellitus with hyperglycemia: Secondary | ICD-10-CM | POA: Insufficient documentation

## 2015-10-24 DIAGNOSIS — Z791 Long term (current) use of non-steroidal anti-inflammatories (NSAID): Secondary | ICD-10-CM | POA: Insufficient documentation

## 2015-10-24 DIAGNOSIS — G4489 Other headache syndrome: Secondary | ICD-10-CM

## 2015-10-24 DIAGNOSIS — Z7984 Long term (current) use of oral hypoglycemic drugs: Secondary | ICD-10-CM | POA: Insufficient documentation

## 2015-10-24 DIAGNOSIS — R739 Hyperglycemia, unspecified: Secondary | ICD-10-CM

## 2015-10-24 DIAGNOSIS — Z794 Long term (current) use of insulin: Secondary | ICD-10-CM | POA: Insufficient documentation

## 2015-10-24 DIAGNOSIS — Z79899 Other long term (current) drug therapy: Secondary | ICD-10-CM | POA: Insufficient documentation

## 2015-10-24 HISTORY — DX: Migraine, unspecified, not intractable, without status migrainosus: G43.909

## 2015-10-24 LAB — CBG MONITORING, ED: Glucose-Capillary: 432 mg/dL — ABNORMAL HIGH (ref 65–99)

## 2015-10-24 MED ORDER — INSULIN ASPART 100 UNIT/ML ~~LOC~~ SOLN
15.0000 [IU] | Freq: Once | SUBCUTANEOUS | Status: AC
  Administered 2015-10-24: 15 [IU] via SUBCUTANEOUS
  Filled 2015-10-24: qty 1

## 2015-10-24 MED ORDER — PROCHLORPERAZINE EDISYLATE 5 MG/ML IJ SOLN
10.0000 mg | Freq: Once | INTRAMUSCULAR | Status: AC
  Administered 2015-10-24: 10 mg via INTRAMUSCULAR
  Filled 2015-10-24: qty 2

## 2015-10-24 NOTE — ED Provider Notes (Signed)
CSN: [id number]     Arrival date & time 10/24/15  1513 History   First MD Initiated Contact with Patient 10/24/15 1640     Chief Complaint  Patient presents with  . Headache     (Consider location/radiation/quality/duration/timing/severity/associated sxs/prior Treatment) HPI  Rickey Smith is a 50 y.o. male who presents for evaluation of headache which she describes as "migraine", located in 4 had bilateral temporal, and the posterior scalp area. No associated fever, chills, nausea, vomiting, weakness or dizziness. He skipped his noontime insulin, but ate lunch, as usual. He states that when he gets headaches like this. He typically has to come to the emergency department". There are no other known modifying factors.   Past Medical History  Diagnosis Date  . Diabetes mellitus without complication (HCC)   . Migraine    Past Surgical History  Procedure Laterality Date  . Transurethral resection of prostate N/A 08/16/2015    Procedure: TRANSURETHRAL RESECTION DRAINAGE OF PROSTATE ABCESS;  Surgeon: Barron Alvineavid Grapey, MD;  Location: [facility];  Service: Urology;  Laterality: N/A;   Family History  Problem Relation Age of Onset  . Heart attack    . Prostate cancer    . Hypertension    . Hypertension Mother   . Cancer Father    Social History  Substance Use Topics  . Smoking status: Former Smoker -- 1.00 packs/day for 20 years    Types: Cigarettes    Quit date: 08/09/2014  . Smokeless tobacco: Never Used  . Alcohol Use: No    Review of Systems  All other systems reviewed and are negative.     Allergies  Codeine and Gabapentin  Home Medications   Prior to Admission medications   Medication Sig Start Date End Date Taking? Authorizing Provider  amitriptyline (ELAVIL) 75 MG tablet Take 1 tablet (75 mg total) by mouth at bedtime. 08/10/15  Yes Jaclyn ShaggyEnobong Amao, MD  atorvastatin (LIPITOR) 40 MG tablet Take 1 tablet (40 mg total) by mouth daily at 6 PM. 10/26/14  Yes Estela Isaiah BlakesY Hernandez  Acosta, MD  ibuprofen (ADVIL,MOTRIN) 200 MG tablet Take 200 mg by mouth every 6 (six) hours as needed for mild pain or moderate pain.   Yes Historical Provider, MD  insulin aspart protamine- aspart (NOVOLOG MIX 70/30) (70-30) 100 UNIT/ML injection Inject 0.45 mLs (45 Units total) into the skin 2 (two) times daily with a meal. 08/17/15  Yes Joseph ArtJessica U Vann, DO  metFORMIN (GLUCOPHAGE) 500 MG tablet Take 500 mg by mouth daily with breakfast.   Yes Historical Provider, MD  metoprolol tartrate (LOPRESSOR) 25 MG tablet Take 1 tablet (25 mg total) by mouth 2 (two) times daily. 08/10/15  Yes Jaclyn ShaggyEnobong Amao, MD  cyclobenzaprine (FLEXERIL) 5 MG tablet Take 1 tablet (5 mg total) by mouth 3 (three) times daily as needed for muscle spasms. Patient not taking: Reported on 10/24/2015 09/13/15   Pete Glatterawn T Langeland, MD  naproxen (NAPROSYN) 500 MG tablet Take 1 tablet (500 mg total) by mouth 2 (two) times daily with a meal. Patient not taking: Reported on 10/24/2015 09/13/15   Pete Glatterawn T Langeland, MD  ondansetron (ZOFRAN) 4 MG tablet Take 1 tablet (4 mg total) by mouth every 6 (six) hours. Patient not taking: Reported on 10/24/2015 10/26/14   Henderson CloudEstela Y Hernandez Acosta, MD  predniSONE (DELTASONE) 20 MG tablet Take 1 tablet (20 mg total) by mouth 2 (two) times daily with a meal. Patient not taking: Reported on 10/24/2015 09/13/15   Pete Glatterawn T Langeland, MD  pregabalin (LYRICA) 100 MG capsule Take 1 capsule (100 mg total) by mouth 2 (two) times daily. Patient not taking: Reported on 10/24/2015 09/13/15   Pete Glatter, MD   BP 110/68 mmHg  Pulse 90  Temp(Src) 98 F (36.7 C) (Oral)  Resp 18  Ht  (1.854 m)  Wt 165 lb (74.844 kg)  BMI 21.77 kg/m2  SpO2 100% Physical Exam  Constitutional: He is oriented to person, place, and time. He appears well-developed and well-nourished.  HENT:  Head: Normocephalic and atraumatic.  Right Ear: External ear normal.  Left Ear: External ear normal.  Oral mucous membranes are moist  Eyes:  Conjunctivae and EOM are normal. Pupils are equal, round, and reactive to light.  Neck: Normal range of motion and phonation normal. Neck supple.  No meningismus  Cardiovascular: Normal rate, regular rhythm and normal heart sounds.   Pulmonary/Chest: Effort normal and breath sounds normal. No stridor. He exhibits no bony tenderness.  Abdominal: Soft. There is no tenderness.  Musculoskeletal: Normal range of motion.  Neurological: He is alert and oriented to person, place, and time. No cranial nerve deficit or sensory deficit. He exhibits normal muscle tone. Coordination normal.  No dysarthria and aphasia or nystagmus.  Skin: Skin is warm, dry and intact.  Psychiatric: He has a normal mood and affect. His behavior is normal. Judgment and thought content normal.  Nursing note and vitals reviewed.   ED Course  Procedures (including critical care time)  Initial clinical impression- nonspecific headache, unlikely to represent migraine. Incidental hyperglycemia related to skipping midday dose of regular insulin.  Medications  prochlorperazine (COMPAZINE) injection 10 mg (10 mg Intramuscular Given 10/24/15 1732)  insulin aspart (novoLOG) injection 15 Units (15 Units Subcutaneous Given 10/24/15 1732)    Patient Vitals for the past 24 hrs:  BP Temp Temp src Pulse Resp SpO2 Height Weight  10/24/15 1826 110/68 mmHg 98 F (36.7 C) Oral 90 18 100 % - -  10/24/15 1527 115/78 mmHg 98.1 F (36.7 C) Temporal 106 18 98 %  (1.854 m) 165 lb (74.844 kg)    7:30 PM Reevaluation with update and discussion. After initial assessment and treatment, an updated evaluation reveals He states that his pain is better and he is rising go home. Clementina Mareno L     Labs Review Labs Reviewed  CBG MONITORING, ED - Abnormal; Notable for the following:    Glucose-Capillary 432 (*)    All other components within normal limits    Imaging Review No results found. I have personally reviewed and evaluated these  images and lab results as part of my medical decision-making.   EKG Interpretation None      MDM   Final diagnoses:  Other headache syndrome  Hyperglycemia    Nonspecific headache. Patient improved with treatment. Incidental hyperglycemia related to skipping noontime dose of insulin. She is nontoxic. I doubt serious bacterial infection or metabolic instability.  Nursing Notes Reviewed/ Care Coordinated Applicable Imaging Reviewed Interpretation of Laboratory Data incorporated into ED treatment  The patient appears reasonably screened and/or stabilized for discharge and I doubt any other medical condition or other [facility] requiring further screening, evaluation, or treatment in the ED at this time prior to discharge.  Plan: Home Medications- usual; Home Treatments- rest; return here if the recommended treatment, does not improve the symptoms; Recommended follow up- PCP prn   Mancel Bale, MD 10/24/15 2008

## 2015-10-24 NOTE — Discharge Instructions (Signed)
General Headache Without Cause A headache is pain or discomfort felt around the head or neck area. The specific cause of a headache may not be found. There are many causes and types of headaches. A few common ones are:  Tension headaches.  Migraine headaches.  Cluster headaches.  Chronic daily headaches. HOME CARE INSTRUCTIONS  Watch your condition for any changes. Take these steps to help with your condition: Managing Pain  Take over-the-counter and prescription medicines only as told by your health care provider.  Lie down in a dark, quiet room when you have a headache.  If directed, apply ice to the head and neck area:  Put ice in a plastic bag.  Place a towel between your skin and the bag.  Leave the ice on for 20 minutes, 2-3 times per day.  Use a heating pad or hot shower to apply heat to the head and neck area as told by your health care provider.  Keep lights dim if bright lights bother you or make your headaches worse. Eating and Drinking  Eat meals on a regular schedule.  Limit alcohol use.  Decrease the amount of caffeine you drink, or stop drinking caffeine. General Instructions  Keep all follow-up visits as told by your health care provider. This is important.  Keep a headache journal to help find out what may trigger your headaches. For example, write down:  What you eat and drink.  How much sleep you get.  Any change to your diet or medicines.  Try massage or other relaxation techniques.  Limit stress.  Sit up straight, and do not tense your muscles.  Do not use tobacco products, including cigarettes, chewing tobacco, or e-cigarettes. If you need help quitting, ask your health care provider.  Exercise regularly as told by your health care provider.  Sleep on a regular schedule. Get 7-9 hours of sleep, or the amount recommended by your health care provider. SEEK MEDICAL CARE IF:   Your symptoms are not helped by medicine.  You have a  headache that is different from the usual headache.  You have nausea or you vomit.  You have a fever. SEEK IMMEDIATE MEDICAL CARE IF:   Your headache becomes severe.  You have repeated vomiting.  You have a stiff neck.  You have a loss of vision.  You have problems with speech.  You have pain in the eye or ear.  You have muscular weakness or loss of muscle control.  You lose your balance or have trouble walking.  You feel faint or pass out.  You have confusion.   This information is not intended to replace advice given to you by your health care provider. Make sure you discuss any questions you have with your health care provider.   Document Released: 06/10/2005 Document Revised: 03/01/2015 Document Reviewed: 10/03/2014 Elsevier Interactive Patient Education 2016 Elsevier Inc.  Hyperglycemia Hyperglycemia occurs when the glucose (sugar) in your blood is too high. Hyperglycemia can happen for many reasons, but it most often happens to people who do not know they have diabetes or are not managing their diabetes properly.  CAUSES  Whether you have diabetes or not, there are other causes of hyperglycemia. Hyperglycemia can occur when you have diabetes, but it can also occur in other situations that you might not be as aware of, such as: Diabetes  If you have diabetes and are having problems controlling your blood glucose, hyperglycemia could occur because of some of the following reasons:  Not following  your meal plan.  Not taking your diabetes medications or not taking it properly.  Exercising less or doing less activity than you normally do.  Being sick. Pre-diabetes  This cannot be ignored. Before people develop Type 2 diabetes, they almost always have "pre-diabetes." This is when your blood glucose levels are higher than normal, but not yet high enough to be diagnosed as diabetes. Research has shown that some long-term damage to the body, especially the heart and  circulatory system, may already be occurring during pre-diabetes. If you take action to manage your blood glucose when you have pre-diabetes, you may delay or prevent Type 2 diabetes from developing. Stress  If you have diabetes, you may be "diet" controlled or on oral medications or insulin to control your diabetes. However, you may find that your blood glucose is higher than usual in the hospital whether you have diabetes or not. This is often referred to as "stress hyperglycemia." Stress can elevate your blood glucose. This happens because of hormones put out by the body during times of stress. If stress has been the cause of your high blood glucose, it can be followed regularly by your caregiver. That way he/she can make sure your hyperglycemia does not continue to get worse or progress to diabetes. Steroids  Steroids are medications that act on the infection fighting system (immune system) to block inflammation or infection. One side effect can be a rise in blood glucose. Most people can produce enough extra insulin to allow for this rise, but for those who cannot, steroids make blood glucose levels go even higher. It is not unusual for steroid treatments to "uncover" diabetes that is developing. It is not always possible to determine if the hyperglycemia will go away after the steroids are stopped. A special blood test called an A1c is sometimes done to determine if your blood glucose was elevated before the steroids were started. SYMPTOMS  Thirsty.  Frequent urination.  Dry mouth.  Blurred vision.  Tired or fatigue.  Weakness.  Sleepy.  Tingling in feet or leg. DIAGNOSIS  Diagnosis is made by monitoring blood glucose in one or all of the following ways:  A1c test. This is a chemical found in your blood.  Fingerstick blood glucose monitoring.  Laboratory results. TREATMENT  First, knowing the cause of the hyperglycemia is important before the hyperglycemia can be treated.  Treatment may include, but is not be limited to:  Education.  Change or adjustment in medications.  Change or adjustment in meal plan.  Treatment for an illness, infection, etc.  More frequent blood glucose monitoring.  Change in exercise plan.  Decreasing or stopping steroids.  Lifestyle changes. HOME CARE INSTRUCTIONS   Test your blood glucose as directed.  Exercise regularly. Your caregiver will give you instructions about exercise. Pre-diabetes or diabetes which comes on with stress is helped by exercising.  Eat wholesome, balanced meals. Eat often and at regular, fixed times. Your caregiver or nutritionist will give you a meal plan to guide your sugar intake.  Being at an ideal weight is important. If needed, losing as little as 10 to 15 pounds may help improve blood glucose levels. SEEK MEDICAL CARE IF:   You have questions about medicine, activity, or diet.  You continue to have symptoms (problems such as increased thirst, urination, or weight gain). SEEK IMMEDIATE MEDICAL CARE IF:   You are vomiting or have diarrhea.  Your breath smells fruity.  You are breathing faster or slower.  You are very  sleepy or incoherent.  You have numbness, tingling, or pain in your feet or hands.  You have chest pain.  Your symptoms get worse even though you have been following your caregiver's orders.  If you have any other questions or concerns.   This information is not intended to replace advice given to you by your health care provider. Make sure you discuss any questions you have with your health care provider.   Document Released: 12/04/2000 Document Revised: 09/02/2011 Document Reviewed: 02/14/2015 Elsevier Interactive Patient Education [employer]2016 Elsevier Inc.

## 2015-10-24 NOTE — ED Notes (Signed)
Pt c/o migraine since Sunday.  Denies n/v.  Reports light sensitivity.

## 2015-11-28 ENCOUNTER — Emergency Department (HOSPITAL_COMMUNITY)
Admission: EM | Admit: 2015-11-28 | Discharge: 2015-11-28 | Disposition: A | Discharge status: Home / Self Care | Payer: No Typology Code available for payment source | Attending: Emergency Medicine | Admitting: Emergency Medicine

## 2015-11-28 ENCOUNTER — Encounter (HOSPITAL_COMMUNITY): Payer: Self-pay | Admitting: Emergency Medicine

## 2015-11-28 DIAGNOSIS — A084 Viral intestinal infection, unspecified: Secondary | ICD-10-CM | POA: Insufficient documentation

## 2015-11-28 DIAGNOSIS — R112 Nausea with vomiting, unspecified: Secondary | ICD-10-CM

## 2015-11-28 DIAGNOSIS — M545 Low back pain: Secondary | ICD-10-CM | POA: Insufficient documentation

## 2015-11-28 DIAGNOSIS — Z791 Long term (current) use of non-steroidal anti-inflammatories (NSAID): Secondary | ICD-10-CM | POA: Insufficient documentation

## 2015-11-28 DIAGNOSIS — E860 Dehydration: Secondary | ICD-10-CM

## 2015-11-28 DIAGNOSIS — Z794 Long term (current) use of insulin: Secondary | ICD-10-CM | POA: Insufficient documentation

## 2015-11-28 DIAGNOSIS — E119 Type 2 diabetes mellitus without complications: Secondary | ICD-10-CM | POA: Insufficient documentation

## 2015-11-28 DIAGNOSIS — Z7984 Long term (current) use of oral hypoglycemic drugs: Secondary | ICD-10-CM | POA: Insufficient documentation

## 2015-11-28 DIAGNOSIS — E1165 Type 2 diabetes mellitus with hyperglycemia: Secondary | ICD-10-CM

## 2015-11-28 DIAGNOSIS — Z79899 Other long term (current) drug therapy: Secondary | ICD-10-CM | POA: Insufficient documentation

## 2015-11-28 DIAGNOSIS — F17210 Nicotine dependence, cigarettes, uncomplicated: Secondary | ICD-10-CM | POA: Insufficient documentation

## 2015-11-28 LAB — CBC WITH DIFFERENTIAL/PLATELET
BASOS ABS: 0 10*3/uL (ref 0.0–0.1)
BASOS PCT: 0 %
EOS ABS: 0.2 10*3/uL (ref 0.0–0.7)
Eosinophils Relative: 3 %
HEMATOCRIT: 38.3 % — AB (ref 39.0–52.0)
HEMOGLOBIN: 12.2 g/dL — AB (ref 13.0–17.0)
LYMPHS ABS: 1.5 10*3/uL (ref 0.7–4.0)
Lymphocytes Relative: 23 %
MCH: 27.9 pg (ref 26.0–34.0)
MCHC: 31.9 g/dL (ref 30.0–36.0)
MCV: 87.4 fL (ref 78.0–100.0)
Monocytes Absolute: 0.7 10*3/uL (ref 0.1–1.0)
Monocytes Relative: 10 %
NEUTROS PCT: 64 %
Neutro Abs: 4 10*3/uL (ref 1.7–7.7)
PLATELETS: 206 10*3/uL (ref 150–400)
RBC: 4.38 MIL/uL (ref 4.22–5.81)
RDW: 13.8 % (ref 11.5–15.5)
WBC: 6.3 10*3/uL (ref 4.0–10.5)

## 2015-11-28 LAB — COMPREHENSIVE METABOLIC PANEL
ALK PHOS: 67 U/L (ref 38–126)
ALT: 18 U/L (ref 17–63)
AST: 15 U/L (ref 15–41)
Albumin: 4.1 g/dL (ref 3.5–5.0)
Anion gap: 8 (ref 5–15)
BUN: 20 mg/dL (ref 6–20)
CHLORIDE: 95 mmol/L — AB (ref 101–111)
CO2: 27 mmol/L (ref 22–32)
CREATININE: 0.97 mg/dL (ref 0.61–1.24)
Calcium: 8.8 mg/dL — ABNORMAL LOW (ref 8.9–10.3)
GFR calc Af Amer: >60 mL/min (ref >60)
Glucose, Bld: 483 mg/dL — ABNORMAL HIGH (ref 65–99)
Potassium: 4.4 mmol/L (ref 3.5–5.1)
Sodium: 130 mmol/L — ABNORMAL LOW (ref 135–145)
Total Bilirubin: 0.7 mg/dL (ref 0.3–1.2)
Total Protein: 7 g/dL (ref 6.5–8.1)

## 2015-11-28 LAB — URINALYSIS, ROUTINE W REFLEX MICROSCOPIC
Bilirubin Urine: NEGATIVE
HGB URINE DIPSTICK: NEGATIVE
Leukocytes, UA: NEGATIVE
Nitrite: NEGATIVE
PROTEIN: NEGATIVE mg/dL
SPECIFIC GRAVITY, URINE: 1.01 (ref 1.005–1.030)
pH: 6 (ref 5.0–8.0)

## 2015-11-28 LAB — URINE MICROSCOPIC-ADD ON
Bacteria, UA: NONE SEEN
RBC / HPF: NONE SEEN RBC/hpf (ref 0–5)
Squamous Epithelial / LPF: NONE SEEN
WBC UA: NONE SEEN WBC/hpf (ref 0–5)

## 2015-11-28 LAB — CBG MONITORING, ED
GLUCOSE-CAPILLARY: 351 mg/dL — AB (ref 65–99)
GLUCOSE-CAPILLARY: 239 mg/dL — AB (ref 65–99)
Glucose-Capillary: 450 mg/dL — ABNORMAL HIGH (ref 65–99)
Glucose-Capillary: 508 mg/dL — ABNORMAL HIGH (ref 65–99)

## 2015-11-28 LAB — LIPASE, BLOOD: LIPASE: 16 U/L (ref 11–51)

## 2015-11-28 LAB — TROPONIN I: Troponin I: <0.03 ng/mL (ref <0.031)

## 2015-11-28 MED ORDER — ONDANSETRON HCL 4 MG/2ML IJ SOLN
4.0000 mg | Freq: Once | INTRAMUSCULAR | Status: AC
  Administered 2015-11-28: 4 mg via INTRAVENOUS
  Filled 2015-11-28: qty 2

## 2015-11-28 MED ORDER — SODIUM CHLORIDE 0.9 % IV BOLUS (SEPSIS)
1000.0000 mL | Freq: Once | INTRAVENOUS | Status: AC
  Administered 2015-11-28: 1000 mL via INTRAVENOUS

## 2015-11-28 MED ORDER — ONDANSETRON HCL 4 MG PO TABS: 4.0000 mg | ORAL_TABLET | Freq: Four times a day (QID) | ORAL | Status: DC

## 2015-11-28 MED ORDER — INSULIN ASPART 100 UNIT/ML ~~LOC~~ SOLN
10.0000 [IU] | Freq: Once | SUBCUTANEOUS | Status: AC
  Administered 2015-11-28: 10 [IU] via INTRAVENOUS
  Filled 2015-11-28: qty 1

## 2015-11-28 MED ORDER — MORPHINE SULFATE (PF) 4 MG/ML IV SOLN
4.0000 mg | Freq: Once | INTRAVENOUS | Status: AC
  Administered 2015-11-28: 4 mg via INTRAVENOUS
  Filled 2015-11-28: qty 1

## 2015-11-28 NOTE — ED Notes (Signed)
Pt given diet ginger ale to drink per MD.

## 2015-11-28 NOTE — ED Notes (Signed)
Patient with no complaints at this time. Respirations even and unlabored. Skin warm/dry. Discharge instructions reviewed with patient at this time. Patient given opportunity to voice concerns/ask questions. IV removed per policy and band-aid applied to site. Patient discharged at this time and left Emergency Department with steady gait.  

## 2015-11-28 NOTE — ED Notes (Signed)
Pt reports the drink given to him is making him feel nauseated again, no vomiting at this time.

## 2015-11-28 NOTE — ED Provider Notes (Signed)
CSN: [id number]     Arrival date & time 11/28/15  4098 History  By signing my name below, I, Rickey Smith, attest that this documentation has been prepared under the direction and in the presence of Jacalyn Lefevre, MD.   Electronically Signed: Iona Smith, ED Scribe. 11/28/2015. 12:07 PM   Chief Complaint  Patient presents with  . Abdominal Pain   The history is provided by the patient. No language interpreter was used.   HPI Comments: Rickey Smith is a 50 y.o. male with PMHx of DM who presents to the Emergency Department complaining of gradual onset, nausea and emesis, ongoing since 11 AM yesterday morning. Pt reports associated abdominal pain, mild back pain, and diaphoresis. No other associated symptoms noted. No worsening or alleviating factors noted. Pt denies fever, chills, diarrhea, dysuria, chest pain, shortness of breath, or any other pertinent symptoms  Past Medical History  Diagnosis Date  . Diabetes mellitus without complication (HCC)   . Migraine    Past Surgical History  Procedure Laterality Date  . Transurethral resection of prostate N/A 08/16/2015    Procedure: TRANSURETHRAL RESECTION DRAINAGE OF PROSTATE ABCESS;  Surgeon: Barron Alvine, MD;  Location: [facility];  Service: Urology;  Laterality: N/A;   Family History  Problem Relation Age of Onset  . Heart attack    . Prostate cancer    . Hypertension    . Hypertension Mother   . Cancer Father    Social History  Substance Use Topics  . Smoking status: Current Some Day Smoker -- 0.50 packs/day for 20 years    Types: Cigarettes  . Smokeless tobacco: Never Used  . Alcohol Use: No    Review of Systems  Constitutional: Negative for fever and chills.  Respiratory: Negative for shortness of breath.   Cardiovascular: Negative for chest pain.  Gastrointestinal: Positive for nausea, vomiting and abdominal pain. Negative for diarrhea.  Genitourinary: Negative for dysuria.  Musculoskeletal: Positive for back  pain.  All other systems reviewed and are negative.    Allergies  Codeine and Gabapentin  Home Medications   Prior to Admission medications   Medication Sig Start Date End Date Taking? Authorizing Provider  amitriptyline (ELAVIL) 75 MG tablet Take 1 tablet (75 mg total) by mouth at bedtime. 08/10/15  Yes Jaclyn Shaggy, MD  atorvastatin (LIPITOR) 40 MG tablet Take 1 tablet (40 mg total) by mouth daily at 6 PM. 10/26/14  Yes Estela Isaiah Blakes, MD  ibuprofen (ADVIL,MOTRIN) 200 MG tablet Take 200 mg by mouth every 6 (six) hours as needed for mild pain or moderate pain.   Yes Historical Provider, MD  insulin aspart (NOVOLOG) 100 UNIT/ML injection Inject 15-25 Units into the skin 3 (three) times daily before meals.   Yes Historical Provider, MD  insulin aspart protamine- aspart (NOVOLOG MIX 70/30) (70-30) 100 UNIT/ML injection Inject 0.45 mLs (45 Units total) into the skin 2 (two) times daily with a meal. 08/17/15  Yes Joseph Art, DO  metFORMIN (GLUCOPHAGE) 500 MG tablet Take 500 mg by mouth daily with breakfast.   Yes Historical Provider, MD  metoprolol tartrate (LOPRESSOR) 25 MG tablet Take 1 tablet (25 mg total) by mouth 2 (two) times daily. 08/10/15  Yes Jaclyn Shaggy, MD  cyclobenzaprine (FLEXERIL) 5 MG tablet Take 1 tablet (5 mg total) by mouth 3 (three) times daily as needed for muscle spasms. Patient not taking: Reported on 10/24/2015 09/13/15   Pete Glatter, MD  naproxen (NAPROSYN) 500 MG tablet Take 1 tablet (  500 mg total) by mouth 2 (two) times daily with a meal. Patient not taking: Reported on 10/24/2015 09/13/15   Pete Glatterawn T Langeland, MD  ondansetron (ZOFRAN) 4 MG tablet Take 1 tablet (4 mg total) by mouth every 6 (six) hours. 11/28/15   Jacalyn LefevreJulie Eleonora Peeler, MD  predniSONE (DELTASONE) 20 MG tablet Take 1 tablet (20 mg total) by mouth 2 (two) times daily with a meal. Patient not taking: Reported on 10/24/2015 09/13/15   Pete Glatterawn T Langeland, MD  pregabalin (LYRICA) 100 MG capsule Take 1 capsule  (100 mg total) by mouth 2 (two) times daily. Patient not taking: Reported on 10/24/2015 09/13/15   Pete Glatterawn T Langeland, MD   BP 133/82 mmHg  Pulse 88  Temp(Src) 97.9 F (36.6 C) (Oral)  Resp 17  Ht 6' (1.829 m)  Wt 165 lb (74.844 kg)  BMI 22.37 kg/m2  SpO2 99% Physical Exam  Constitutional: He is oriented to person, place, and time. He appears well-developed and well-nourished.  HENT:  Head: Normocephalic.  Mouth/Throat: Mucous membranes are dry.  Eyes: EOM are normal.  Neck: Normal range of motion.  Cardiovascular: Normal rate, regular rhythm and normal heart sounds.  Exam reveals no gallop.   No murmur heard. Pulmonary/Chest: Effort normal and breath sounds normal. No respiratory distress. He has no wheezes. He has no rales.  Abdominal: Soft. Bowel sounds are normal. He exhibits no distension. There is tenderness. There is no rebound and no guarding.  Epigastric TTP.  Musculoskeletal: Normal range of motion.  Neurological: He is alert and oriented to person, place, and time.  Psychiatric: He has a normal mood and affect.  Nursing note and vitals reviewed.   ED Course  Procedures (including critical care time) DIAGNOSTIC STUDIES: Oxygen Saturation is 99% on RA, normal by my interpretation.    COORDINATION OF CARE: 9:24 AM Discussed treatment plan with pt at bedside and pt agreed to plan.   Labs Review Labs Reviewed  CBC WITH DIFFERENTIAL/PLATELET - Abnormal; Notable for the following:    Hemoglobin 12.2 (*)    HCT 38.3 (*)    All other components within normal limits  COMPREHENSIVE METABOLIC PANEL - Abnormal; Notable for the following:    Sodium 130 (*)    Chloride 95 (*)    Glucose, Bld 483 (*)    Calcium 8.8 (*)    All other components within normal limits  URINALYSIS, ROUTINE W REFLEX MICROSCOPIC (NOT AT [facility]) - Abnormal; Notable for the following:    Glucose, UA >1000 (*)    Ketones, ur TRACE (*)    All other components within normal limits  CBG MONITORING, ED -  Abnormal; Notable for the following:    Glucose-Capillary 508 (*)    All other components within normal limits  CBG MONITORING, ED - Abnormal; Notable for the following:    Glucose-Capillary 450 (*)    All other components within normal limits  CBG MONITORING, ED - Abnormal; Notable for the following:    Glucose-Capillary 351 (*)    All other components within normal limits  LIPASE, BLOOD  TROPONIN I  URINE MICROSCOPIC-ADD ON  CBG MONITORING, ED    Imaging Review No results found. I have personally reviewed and evaluated these images and lab results as part of my medical decision-making.   EKG Interpretation None      MDM  NO EVIDENCE OF DKA.  PT IS FEELING BETTER AND IS ABLE TO TOLERATE PO FLUIDS.  HIS SUGAR IS TRENDING DOWNWARDS.  I SPOKE WITH PT  ABOUT TIGHT CONTROL OVER BS.  HE KNOWS TO RETURN IF WORSE. Final diagnoses:  Poorly controlled type 2 diabetes mellitus (HCC)  Non-intractable vomiting with nausea, vomiting of unspecified type  Dehydration  Viral gastroenteritis    I personally performed the services described in this documentation, which was scribed in my presence. The recorded information has been reviewed and is accurate.     Jacalyn Lefevre, MD 11/28/15 [phone]

## 2015-11-28 NOTE — ED Notes (Signed)
PT c/o epigastric-mid abdominal pain and lower back pain with vomiting since yesterday. PT states normal BM yesterday and denies any urinary symptoms and states CBG 170 last night.

## 2015-11-28 NOTE — ED Notes (Signed)
MD notified of CBG of 508.

## 2016-02-11 ENCOUNTER — Emergency Department (HOSPITAL_COMMUNITY)
Admission: EM | Admit: 2016-02-11 | Discharge: 2016-02-11 | Disposition: A | Discharge status: Home / Self Care | Payer: Self-pay | Attending: Emergency Medicine | Admitting: Emergency Medicine

## 2016-02-11 ENCOUNTER — Emergency Department (HOSPITAL_COMMUNITY): Payer: Self-pay

## 2016-02-11 ENCOUNTER — Encounter (HOSPITAL_COMMUNITY): Payer: Self-pay

## 2016-02-11 DIAGNOSIS — E1065 Type 1 diabetes mellitus with hyperglycemia: Secondary | ICD-10-CM | POA: Insufficient documentation

## 2016-02-11 DIAGNOSIS — Z79899 Other long term (current) drug therapy: Secondary | ICD-10-CM | POA: Insufficient documentation

## 2016-02-11 DIAGNOSIS — R109 Unspecified abdominal pain: Secondary | ICD-10-CM | POA: Insufficient documentation

## 2016-02-11 DIAGNOSIS — E1010 Type 1 diabetes mellitus with ketoacidosis without coma: Secondary | ICD-10-CM | POA: Insufficient documentation

## 2016-02-11 DIAGNOSIS — E1021 Type 1 diabetes mellitus with diabetic nephropathy: Secondary | ICD-10-CM | POA: Insufficient documentation

## 2016-02-11 DIAGNOSIS — R7309 Other abnormal glucose: Secondary | ICD-10-CM

## 2016-02-11 DIAGNOSIS — F17210 Nicotine dependence, cigarettes, uncomplicated: Secondary | ICD-10-CM | POA: Insufficient documentation

## 2016-02-11 LAB — BASIC METABOLIC PANEL
Anion gap: 9 (ref 5–15)
BUN: 10 mg/dL (ref 6–20)
CO2: 24 mmol/L (ref 22–32)
Calcium: 9.6 mg/dL (ref 8.9–10.3)
Chloride: 102 mmol/L (ref 101–111)
Creatinine, Ser: 0.71 mg/dL (ref 0.61–1.24)
GFR calc Af Amer: >60 mL/min (ref >60)
GFR calc non Af Amer: >60 mL/min (ref >60)
Glucose, Bld: 171 mg/dL — ABNORMAL HIGH (ref 65–99)
Potassium: 3.9 mmol/L (ref 3.5–5.1)
Sodium: 135 mmol/L (ref 135–145)

## 2016-02-11 LAB — CBC WITH DIFFERENTIAL/PLATELET
Basophils Absolute: 0 10*3/uL (ref 0.0–0.1)
Basophils Relative: 0 %
Eosinophils Absolute: 0.2 10*3/uL (ref 0.0–0.7)
Eosinophils Relative: 2 %
HCT: 41.6 % (ref 39.0–52.0)
Hemoglobin: 13.6 g/dL (ref 13.0–17.0)
Lymphocytes Relative: 20 %
Lymphs Abs: 1.6 10*3/uL (ref 0.7–4.0)
MCH: 28.8 pg (ref 26.0–34.0)
MCHC: 32.7 g/dL (ref 30.0–36.0)
MCV: 88.1 fL (ref 78.0–100.0)
Monocytes Absolute: 0.8 10*3/uL (ref 0.1–1.0)
Monocytes Relative: 10 %
Neutro Abs: 5.1 10*3/uL (ref 1.7–7.7)
Neutrophils Relative %: 68 %
Platelets: 219 10*3/uL (ref 150–400)
RBC: 4.72 MIL/uL (ref 4.22–5.81)
RDW: 12.8 % (ref 11.5–15.5)
WBC: 7.6 10*3/uL (ref 4.0–10.5)

## 2016-02-11 LAB — URINALYSIS, ROUTINE W REFLEX MICROSCOPIC
Bilirubin Urine: NEGATIVE
Glucose, UA: >1000 mg/dL — AB
Hgb urine dipstick: NEGATIVE
Ketones, ur: NEGATIVE mg/dL
Leukocytes, UA: NEGATIVE
Nitrite: NEGATIVE
Protein, ur: NEGATIVE mg/dL
Specific Gravity, Urine: 1.033 — ABNORMAL HIGH (ref 1.005–1.030)
pH: 7.5 (ref 5.0–8.0)

## 2016-02-11 LAB — URINE MICROSCOPIC-ADD ON: Bacteria, UA: NONE SEEN

## 2016-02-11 LAB — CBG MONITORING, ED
GLUCOSE-CAPILLARY: 335 mg/dL — AB (ref 65–99)
Glucose-Capillary: 65 mg/dL (ref 65–99)
Glucose-Capillary: 137 mg/dL — ABNORMAL HIGH (ref 65–99)

## 2016-02-11 MED ORDER — SODIUM CHLORIDE 0.9 % IV BOLUS (SEPSIS)
1000.0000 mL | Freq: Once | INTRAVENOUS | Status: AC
  Administered 2016-02-11: 1000 mL via INTRAVENOUS

## 2016-02-11 MED ORDER — ACETAMINOPHEN 325 MG PO TABS
325.0000 mg | ORAL_TABLET | Freq: Once | ORAL | Status: AC
  Administered 2016-02-11: 325 mg via ORAL
  Filled 2016-02-11: qty 1

## 2016-02-11 MED ORDER — IOPAMIDOL (ISOVUE-300) INJECTION 61%
INTRAVENOUS | Status: AC
  Administered 2016-02-11: 100 mL
  Filled 2016-02-11: qty 100

## 2016-02-11 MED ORDER — POLYETHYLENE GLYCOL 3350 17 G PO PACK
34.0000 g | PACK | Freq: Every day | ORAL | Status: DC
  Administered 2016-02-11: 34 g via ORAL
  Filled 2016-02-11: qty 2

## 2016-02-11 MED ORDER — MORPHINE SULFATE (PF) 4 MG/ML IV SOLN
4.0000 mg | Freq: Once | INTRAVENOUS | Status: AC
Start: 2016-02-11 — End: 2016-02-11
  Administered 2016-02-11: 4 mg via INTRAVENOUS
  Filled 2016-02-11: qty 1

## 2016-02-11 NOTE — ED Notes (Signed)
EDP at bedside  

## 2016-02-11 NOTE — ED Triage Notes (Signed)
Patient complains of wide variation in blood sugars the past 2 days. This am awoke with diaphoresis and BS 49. Ate breakfast and them rechecked pta and BS close to 500, has taken his insulin this am. Alert and oriented, BS 335 on arrival

## 2016-02-11 NOTE — ED Notes (Signed)
EDP aware of CBG 65, stated to give juice and crackers. Will continue to monitor.

## 2016-02-11 NOTE — Discharge Instructions (Signed)
Decrease dose of evening 70/30 insulin to 35 units today and then resume medications as prescribed. Keep a detailed log of your glucose readings. If you continue to have extreme highs or lows then you need [facility] or otherwise.

## 2016-02-11 NOTE — ED Notes (Signed)
CBG 65. RN notified. Pt reports not feeling well and having a migraine.

## 2016-02-15 NOTE — ED Provider Notes (Signed)
[facility]PT Provider Note   CSN: [id number] Arrival date & time: 02/11/16  [id number]     History   Chief Complaint Chief Complaint  Patient presents with  . Hyperglycemia    HPI [name] is a 50 y.o. male.  HPI   50 year old male with labile blood sugars for the past 2 days. This morning he woke up and was sweating and generally felt ill. His blood sugar was 49. AIDS breakfast he gave himself his morning insulin. Sugars were 500 on recheck. He waited a while and rechecked it and it was still the same range essentially emergency room. Blood sugar was 335 on arrival. He currently has no acute complaints. He reports that he's been taking his insulin as he should be over the last several days. Denies any infectious symptoms. Denies any recent changes in his insulin regimen.   Past Medical History:  Diagnosis Date  . Diabetes mellitus without complication (HCC)   . Migraine     Patient Active Problem List   Diagnosis Date Noted  . Sepsis (HCC) 08/12/2015  . Prostate abscess 08/11/2015  . Abscess 08/11/2015  . UTI (lower urinary tract infection) 08/11/2015  . Diabetic neuropathy (HCC) 08/10/2015  . Arterial hypotension   . Tachycardia 08/02/2015  . SIRS (systemic inflammatory response syndrome) (HCC) 08/02/2015  . GERD (gastroesophageal reflux disease) 10/26/2014  . Leukocytosis 10/23/2014  . Hyperkalemia 10/23/2014  . DKA, type 1 (HCC) 10/23/2014  . Hyperglycemia 07/13/2014  . Diabetic hyperosmolar non-ketotic state (HCC) 07/13/2014  . Diabetes mellitus with nonketotic hyperosmolarity (HCC) 07/13/2014  . Type 1 diabetes mellitus (HCC)   . DKA (diabetic ketoacidoses) (HCC) 04/26/2014  . Chest pain, ROMI, DDX pericarditis 04/26/2014  . Ex-smoker 04/26/2014  . Hyponatremia 04/26/2014  . Metabolic acidosis 04/26/2014    Past Surgical History:  Procedure Laterality Date  . TRANSURETHRAL RESECTION OF PROSTATE N/A 08/16/2015   Procedure: TRANSURETHRAL RESECTION  DRAINAGE OF PROSTATE ABCESS;  Surgeon: Barron Alvineavid Grapey, MD;  Location: [facility];  Service: Urology;  Laterality: N/A;       Home Medications    Prior to Admission medications   Medication Sig Start Date End Date Taking? Authorizing Provider  amitriptyline (ELAVIL) 75 MG tablet Take 1 tablet (75 mg total) by mouth at bedtime. 08/10/15  Yes Jaclyn ShaggyEnobong Amao, MD  ibuprofen (ADVIL,MOTRIN) 200 MG tablet Take 400 mg by mouth every 6 (six) hours as needed for mild pain or moderate pain.    Yes Historical Provider, MD  insulin aspart (NOVOLOG) 100 UNIT/ML injection Inject 15-25 Units into the skin 3 (three) times daily as needed for high blood sugar. Per sliding scale   Yes Historical Provider, MD  insulin aspart protamine- aspart (NOVOLOG MIX 70/30) (70-30) 100 UNIT/ML injection Inject 0.45 mLs (45 Units total) into the skin 2 (two) times daily with a meal. 08/17/15  Yes Joseph ArtJessica U Vann, DO  metFORMIN (GLUCOPHAGE) 500 MG tablet Take 500 mg by mouth daily with breakfast.   Yes Historical Provider, MD  metoprolol tartrate (LOPRESSOR) 25 MG tablet Take 1 tablet (25 mg total) by mouth 2 (two) times daily. 08/10/15  Yes Jaclyn ShaggyEnobong Amao, MD  atorvastatin (LIPITOR) 40 MG tablet Take 1 tablet (40 mg total) by mouth daily at 6 PM. Patient not taking: Reported on 02/11/2016 10/26/14   Henderson CloudEstela Y Hernandez Acosta, MD  ondansetron (ZOFRAN) 4 MG tablet Take 1 tablet (4 mg total) by mouth every 6 (six) hours. Patient taking differently: Take 4 mg by mouth every 6 (six) hours  as needed for nausea.  11/28/15   Jacalyn LefevreJulie Haviland, MD  pregabalin (LYRICA) 100 MG capsule Take 1 capsule (100 mg total) by mouth 2 (two) times daily. Patient not taking: Reported on 10/24/2015 09/13/15   Pete Glatterawn T Langeland, MD    Family History Family History  Problem Relation Age of Onset  . Heart attack    . Prostate cancer    . Hypertension    . Hypertension Mother   . Cancer Father     Social History Social History  Substance Use Topics  . Smoking  status: Current Some Day Smoker    Packs/day: 0.50    Years: 20.00    Types: Cigarettes  . Smokeless tobacco: Never Used  . Alcohol use No     Allergies   Codeine and Gabapentin   Review of Systems Review of Systems  All systems reviewed and negative, other than as noted in HPI.  Physical Exam Updated Vital Signs BP 137/93   Pulse 81   Temp 99.5 F (37.5 C) (Oral)   Resp 17   Ht 6\' 1"  (1.854 m)   Wt 170 lb (77.1 kg)   SpO2 98%   BMI 22.43 kg/m   Physical Exam  Constitutional: He appears well-developed and well-nourished.  Disheveled appearance. NAD.  HENT:  Head: Normocephalic and atraumatic.  Eyes: Conjunctivae are normal.  Neck: Neck supple.  Cardiovascular: Normal rate and regular rhythm.   No murmur heard. Pulmonary/Chest: Effort normal and breath sounds normal. No respiratory distress.  Abdominal: Soft. There is no tenderness.  Musculoskeletal: He exhibits no edema.  Neurological: He is alert.  Skin: Skin is warm and dry.  Psychiatric: He has a normal mood and affect.  Nursing note and vitals reviewed.    ED Treatments / Results  Labs (all labs ordered are listed, but only abnormal results are displayed) Labs Reviewed  URINALYSIS, ROUTINE W REFLEX MICROSCOPIC (NOT AT [facility]) - Abnormal; Notable for the following:       Result Value   Specific Gravity, Urine 1.033 (*)    Glucose, UA >1000 (*)    All other components within normal limits  BASIC METABOLIC PANEL - Abnormal; Notable for the following:    Glucose, Bld 171 (*)    All other components within normal limits  URINE MICROSCOPIC-ADD ON - Abnormal; Notable for the following:    Squamous Epithelial / LPF 0-5 (*)    All other components within normal limits  CBG MONITORING, ED - Abnormal; Notable for the following:    Glucose-Capillary 335 (*)    All other components within normal limits  CBG MONITORING, ED - Abnormal; Notable for the following:    Glucose-Capillary 137 (*)    All other  components within normal limits  CBC WITH DIFFERENTIAL/PLATELET  CBG MONITORING, ED    EKG  EKG Interpretation  Date/Time:  Sunday February 11 2016 09:28:58 EDT Ventricular Rate:  88 PR Interval:    QRS Duration: 88 QT Interval:  360 QTC Calculation: 436 R Axis:   70 Text Interpretation:  Sinus rhythm Probable left atrial enlargement Confirmed by Juleen ChinaKOHUT  MD, Taylee Gunnells ([id number]) on 02/11/2016 10:04:43 AM       Radiology No results found.  Procedures Procedures (including critical care time)  Medications Ordered in ED Medications  sodium chloride 0.9 % bolus 1,000 mL (0 mLs Intravenous Stopped 02/11/16 1112)  iopamidol (ISOVUE-300) 61 % injection (100 mLs  Contrast Given 02/11/16 1246)  morphine 4 MG/ML injection 4 mg (4 mg Intravenous Given  02/11/16 1224)  acetaminophen (TYLENOL) tablet 325 mg (325 mg Oral Given 02/11/16 1223)     Initial Impression / Assessment and Plan / ED Course  I have reviewed the triage vital signs and the nursing notes.  Pertinent labs & imaging results that were available during my care of the patient were reviewed by me and considered in my medical decision making (see chart for details).  Clinical Course      Final Clinical Impressions(s) / ED Diagnoses   Final diagnoses:  Labile blood glucose  Right sided abdominal pain    New Prescriptions Discharge Medication List as of 02/11/2016  2:11 PM       Raeford Razor, MD 02/17/16 2127

## 2016-03-11 ENCOUNTER — Encounter (HOSPITAL_COMMUNITY): Payer: Self-pay | Admitting: Emergency Medicine

## 2016-03-11 ENCOUNTER — Inpatient Hospital Stay (HOSPITAL_COMMUNITY)
Admission: EM | Admit: 2016-03-11 | Discharge: 2016-03-14 | DRG: 638 | Disposition: A | Discharge status: Home / Self Care | Payer: Self-pay | Attending: Oncology | Admitting: Oncology

## 2016-03-11 DIAGNOSIS — R1013 Epigastric pain: Secondary | ICD-10-CM

## 2016-03-11 DIAGNOSIS — E1110 Type 2 diabetes mellitus with ketoacidosis without coma: Secondary | ICD-10-CM | POA: Diagnosis present

## 2016-03-11 DIAGNOSIS — N179 Acute kidney failure, unspecified: Secondary | ICD-10-CM | POA: Diagnosis present

## 2016-03-11 DIAGNOSIS — F1110 Opioid abuse, uncomplicated: Secondary | ICD-10-CM

## 2016-03-11 DIAGNOSIS — Z885 Allergy status to narcotic agent status: Secondary | ICD-10-CM

## 2016-03-11 DIAGNOSIS — Z8249 Family history of ischemic heart disease and other diseases of the circulatory system: Secondary | ICD-10-CM

## 2016-03-11 DIAGNOSIS — Z886 Allergy status to analgesic agent status: Secondary | ICD-10-CM

## 2016-03-11 DIAGNOSIS — K219 Gastro-esophageal reflux disease without esophagitis: Secondary | ICD-10-CM | POA: Diagnosis present

## 2016-03-11 DIAGNOSIS — E1310 Other specified diabetes mellitus with ketoacidosis without coma: Principal | ICD-10-CM | POA: Diagnosis present

## 2016-03-11 DIAGNOSIS — I10 Essential (primary) hypertension: Secondary | ICD-10-CM | POA: Diagnosis present

## 2016-03-11 DIAGNOSIS — E1142 Type 2 diabetes mellitus with diabetic polyneuropathy: Secondary | ICD-10-CM | POA: Diagnosis present

## 2016-03-11 DIAGNOSIS — F17210 Nicotine dependence, cigarettes, uncomplicated: Secondary | ICD-10-CM | POA: Diagnosis present

## 2016-03-11 DIAGNOSIS — Z833 Family history of diabetes mellitus: Secondary | ICD-10-CM

## 2016-03-11 DIAGNOSIS — Z794 Long term (current) use of insulin: Secondary | ICD-10-CM

## 2016-03-11 DIAGNOSIS — K2970 Gastritis, unspecified, without bleeding: Secondary | ICD-10-CM | POA: Diagnosis present

## 2016-03-11 DIAGNOSIS — Z79899 Other long term (current) drug therapy: Secondary | ICD-10-CM

## 2016-03-11 LAB — COMPREHENSIVE METABOLIC PANEL
ALBUMIN: 4.5 g/dL (ref 3.5–5.0)
ALT: 14 U/L — ABNORMAL LOW (ref 17–63)
AST: 15 U/L (ref 15–41)
Alkaline Phosphatase: 78 U/L (ref 38–126)
Anion gap: 27 — ABNORMAL HIGH (ref 5–15)
BILIRUBIN TOTAL: 1.6 mg/dL — AB (ref 0.3–1.2)
BUN: 28 mg/dL — AB (ref 6–20)
CHLORIDE: 94 mmol/L — AB (ref 101–111)
CO2: 11 mmol/L — AB (ref 22–32)
Calcium: 10.6 mg/dL — ABNORMAL HIGH (ref 8.9–10.3)
Creatinine, Ser: 1.77 mg/dL — ABNORMAL HIGH (ref 0.61–1.24)
GFR calc Af Amer: 50 mL/min — ABNORMAL LOW (ref >60)
GFR calc non Af Amer: 43 mL/min — ABNORMAL LOW (ref >60)
GLUCOSE: 576 mg/dL — AB (ref 65–99)
POTASSIUM: 6.3 mmol/L — AB (ref 3.5–5.1)
SODIUM: 132 mmol/L — AB (ref 135–145)
Total Protein: 8.4 g/dL — ABNORMAL HIGH (ref 6.5–8.1)

## 2016-03-11 LAB — I-STAT VENOUS BLOOD GAS, ED
ACID-BASE DEFICIT: 16 mmol/L — AB (ref 0.0–2.0)
BICARBONATE: 10 mmol/L — AB (ref 20.0–28.0)
O2 SAT: 67 %
PO2 VEN: 41 mmHg (ref 32.0–45.0)
TCO2: 11 mmol/L (ref 0–100)
pCO2, Ven: 24.4 mmHg — ABNORMAL LOW (ref 44.0–60.0)
pH, Ven: 7.22 — ABNORMAL LOW (ref 7.250–7.430)

## 2016-03-11 LAB — BASIC METABOLIC PANEL
ANION GAP: 16 — AB (ref 5–15)
ANION GAP: 13 (ref 5–15)
BUN: 23 mg/dL — AB (ref 6–20)
BUN: 22 mg/dL — AB (ref 6–20)
CALCIUM: 9.4 mg/dL (ref 8.9–10.3)
CALCIUM: 9.8 mg/dL (ref 8.9–10.3)
CO2: 14 mmol/L — ABNORMAL LOW (ref 22–32)
CO2: 18 mmol/L — AB (ref 22–32)
CREATININE: 1.4 mg/dL — AB (ref 0.61–1.24)
CREATININE: 1.12 mg/dL (ref 0.61–1.24)
Chloride: 105 mmol/L (ref 101–111)
Chloride: 108 mmol/L (ref 101–111)
GFR calc Af Amer: >60 mL/min (ref >60)
GFR calc Af Amer: >60 mL/min (ref >60)
GFR, EST NON AFRICAN AMERICAN: 58 mL/min — AB (ref >60)
GLUCOSE: 321 mg/dL — AB (ref 65–99)
GLUCOSE: 186 mg/dL — AB (ref 65–99)
Potassium: 4.1 mmol/L (ref 3.5–5.1)
Potassium: 3.9 mmol/L (ref 3.5–5.1)
Sodium: 135 mmol/L (ref 135–145)
Sodium: 139 mmol/L (ref 135–145)

## 2016-03-11 LAB — RAPID URINE DRUG SCREEN, HOSP PERFORMED
Amphetamines: NOT DETECTED
BARBITURATES: NOT DETECTED
Benzodiazepines: NOT DETECTED
Cocaine: POSITIVE — AB
Opiates: POSITIVE — AB
Tetrahydrocannabinol: NOT DETECTED

## 2016-03-11 LAB — I-STAT TROPONIN, ED: Troponin i, poc: 0.01 ng/mL (ref 0.00–0.08)

## 2016-03-11 LAB — CBC WITH DIFFERENTIAL/PLATELET
BASOS ABS: 0 10*3/uL (ref 0.0–0.1)
Basophils Relative: 0 %
Eosinophils Absolute: 0 10*3/uL (ref 0.0–0.7)
Eosinophils Relative: 0 %
HEMATOCRIT: 48 % (ref 39.0–52.0)
Hemoglobin: 16 g/dL (ref 13.0–17.0)
LYMPHS ABS: 0.8 10*3/uL (ref 0.7–4.0)
Lymphocytes Relative: 6 %
MCH: 29.7 pg (ref 26.0–34.0)
MCHC: 33.3 g/dL (ref 30.0–36.0)
MCV: 89.2 fL (ref 78.0–100.0)
MONOS PCT: 2 %
Monocytes Absolute: 0.3 10*3/uL (ref 0.1–1.0)
Neutro Abs: 12.8 10*3/uL — ABNORMAL HIGH (ref 1.7–7.7)
Neutrophils Relative %: 92 %
PLATELETS: 302 10*3/uL (ref 150–400)
RBC: 5.38 MIL/uL (ref 4.22–5.81)
RDW: 12.6 % (ref 11.5–15.5)
WBC: 13.9 10*3/uL — AB (ref 4.0–10.5)

## 2016-03-11 LAB — URINALYSIS, ROUTINE W REFLEX MICROSCOPIC
Bilirubin Urine: NEGATIVE
Hgb urine dipstick: NEGATIVE
Ketones, ur: >80 mg/dL — AB
LEUKOCYTES UA: NEGATIVE
Nitrite: NEGATIVE
PH: 5 (ref 5.0–8.0)
Protein, ur: NEGATIVE mg/dL
SPECIFIC GRAVITY, URINE: 1.027 (ref 1.005–1.030)

## 2016-03-11 LAB — URINE MICROSCOPIC-ADD ON
BACTERIA UA: NONE SEEN
RBC / HPF: NONE SEEN RBC/hpf (ref 0–5)
SQUAMOUS EPITHELIAL / LPF: NONE SEEN
WBC, UA: NONE SEEN WBC/hpf (ref 0–5)

## 2016-03-11 LAB — LIPASE, BLOOD: Lipase: 18 U/L (ref 11–51)

## 2016-03-11 LAB — LACTIC ACID, PLASMA: LACTIC ACID, VENOUS: 1.4 mmol/L (ref 0.5–1.9)

## 2016-03-11 LAB — CBG MONITORING, ED
GLUCOSE-CAPILLARY: 390 mg/dL — AB (ref 65–99)
Glucose-Capillary: 570 mg/dL (ref 65–99)
Glucose-Capillary: 418 mg/dL — ABNORMAL HIGH (ref 65–99)
Glucose-Capillary: 431 mg/dL — ABNORMAL HIGH (ref 65–99)

## 2016-03-11 LAB — GLUCOSE, CAPILLARY
GLUCOSE-CAPILLARY: 317 mg/dL — AB (ref 65–99)
GLUCOSE-CAPILLARY: 257 mg/dL — AB (ref 65–99)
GLUCOSE-CAPILLARY: 236 mg/dL — AB (ref 65–99)
Glucose-Capillary: 105 mg/dL — ABNORMAL HIGH (ref 65–99)
Glucose-Capillary: 117 mg/dL — ABNORMAL HIGH (ref 65–99)
Glucose-Capillary: 157 mg/dL — ABNORMAL HIGH (ref 65–99)
Glucose-Capillary: 193 mg/dL — ABNORMAL HIGH (ref 65–99)

## 2016-03-11 LAB — I-STAT CG4 LACTIC ACID, ED: LACTIC ACID, VENOUS: 4.26 mmol/L — AB (ref 0.5–1.9)

## 2016-03-11 LAB — MRSA PCR SCREENING: MRSA by PCR: NEGATIVE

## 2016-03-11 LAB — BETA-HYDROXYBUTYRIC ACID: Beta-Hydroxybutyric Acid: 6.56 mmol/L — ABNORMAL HIGH (ref 0.05–0.27)

## 2016-03-11 MED ORDER — ACETAMINOPHEN 650 MG RE SUPP: 650.0000 mg | Freq: Four times a day (QID) | RECTAL | Status: DC | PRN

## 2016-03-11 MED ORDER — DEXTROSE-NACL 5-0.45 % IV SOLN
INTRAVENOUS | Status: DC
Start: 2016-03-11 — End: 2016-03-11

## 2016-03-11 MED ORDER — MORPHINE SULFATE (PF) 2 MG/ML IV SOLN
2.0000 mg | INTRAVENOUS | Status: DC | PRN
  Administered 2016-03-11 – 2016-03-12 (×3): 2 mg via INTRAVENOUS
  Filled 2016-03-11 (×3): qty 1

## 2016-03-11 MED ORDER — SODIUM CHLORIDE 0.9% FLUSH
3.0000 mL | Freq: Two times a day (BID) | INTRAVENOUS | Status: DC
  Administered 2016-03-11 – 2016-03-13 (×5): 3 mL via INTRAVENOUS

## 2016-03-11 MED ORDER — INSULIN REGULAR BOLUS VIA INFUSION
0.0000 [IU] | Freq: Three times a day (TID) | INTRAVENOUS | Status: DC
  Filled 2016-03-11: qty 10

## 2016-03-11 MED ORDER — DEXTROSE 50 % IV SOLN: 25.0000 mL | INTRAVENOUS | Status: DC | PRN

## 2016-03-11 MED ORDER — SODIUM CHLORIDE 0.9 % IV SOLN
INTRAVENOUS | Status: DC
  Administered 2016-03-11: 3.7 [IU]/h via INTRAVENOUS
  Filled 2016-03-11: qty 2.5

## 2016-03-11 MED ORDER — INSULIN ASPART 100 UNIT/ML ~~LOC~~ SOLN
10.0000 [IU] | Freq: Once | SUBCUTANEOUS | Status: AC
  Administered 2016-03-11: 10 [IU] via INTRAVENOUS
  Filled 2016-03-11: qty 1

## 2016-03-11 MED ORDER — SODIUM CHLORIDE 0.9 % IV BOLUS (SEPSIS)
1000.0000 mL | Freq: Once | INTRAVENOUS | Status: AC
  Administered 2016-03-11: 1000 mL via INTRAVENOUS

## 2016-03-11 MED ORDER — DEXTROSE-NACL 5-0.45 % IV SOLN
INTRAVENOUS | Status: DC
  Administered 2016-03-12: 01:00:00 via INTRAVENOUS

## 2016-03-11 MED ORDER — PROMETHAZINE HCL 25 MG/ML IJ SOLN
25.0000 mg | Freq: Once | INTRAMUSCULAR | Status: AC
  Administered 2016-03-11: 25 mg via INTRAVENOUS
  Filled 2016-03-11: qty 1

## 2016-03-11 MED ORDER — SODIUM CHLORIDE 0.9 % IV SOLN
INTRAVENOUS | Status: DC
Start: 2016-03-11 — End: 2016-03-13
  Administered 2016-03-11: 16:00:00 via INTRAVENOUS

## 2016-03-11 MED ORDER — SODIUM CHLORIDE 0.9 % IV SOLN: INTRAVENOUS | Status: DC

## 2016-03-11 MED ORDER — ENOXAPARIN SODIUM 40 MG/0.4ML ~~LOC~~ SOLN
40.0000 mg | SUBCUTANEOUS | Status: DC
  Administered 2016-03-11 – 2016-03-13 (×3): 40 mg via SUBCUTANEOUS
  Filled 2016-03-11 (×3): qty 0.4

## 2016-03-11 MED ORDER — INSULIN REGULAR HUMAN 100 UNIT/ML IJ SOLN
INTRAMUSCULAR | Status: DC
Start: 2016-03-11 — End: 2016-03-12
  Administered 2016-03-11: 8 [IU]/h via INTRAVENOUS
  Administered 2016-03-11: 5.8 [IU]/h via INTRAVENOUS
  Administered 2016-03-11: 2.2 [IU]/h via INTRAVENOUS
  Administered 2016-03-11: 7.7 [IU]/h via INTRAVENOUS
  Filled 2016-03-11: qty 2.5

## 2016-03-11 MED ORDER — INSULIN ASPART 100 UNIT/ML ~~LOC~~ SOLN
5.0000 [IU] | Freq: Once | SUBCUTANEOUS | Status: AC
  Administered 2016-03-11: 5 [IU] via INTRAVENOUS
  Filled 2016-03-11: qty 1

## 2016-03-11 MED ORDER — ACETAMINOPHEN 325 MG PO TABS
650.0000 mg | ORAL_TABLET | Freq: Four times a day (QID) | ORAL | Status: DC | PRN
  Administered 2016-03-12: 650 mg via ORAL
  Filled 2016-03-11: qty 2

## 2016-03-11 MED ORDER — ONDANSETRON HCL 4 MG/2ML IJ SOLN
4.0000 mg | Freq: Four times a day (QID) | INTRAMUSCULAR | Status: DC | PRN
  Administered 2016-03-11 – 2016-03-13 (×5): 4 mg via INTRAVENOUS
  Filled 2016-03-11 (×5): qty 2

## 2016-03-11 MED ORDER — SODIUM CHLORIDE 0.9 % IV SOLN
INTRAVENOUS | Status: DC
  Administered 2016-03-11: 125 mL/h via INTRAVENOUS

## 2016-03-11 MED ORDER — SODIUM CHLORIDE 0.9 % IV BOLUS (SEPSIS): 1000.0000 mL | Freq: Once | INTRAVENOUS | Status: DC

## 2016-03-11 NOTE — ED Notes (Signed)
Notified Dr. Denton LankSteinl that latic acid resulted at 4.26.

## 2016-03-11 NOTE — ED Triage Notes (Signed)
Pt reports epigastric burning and n/v. Pts blood sugar over 500 en route. EMS gave 1L NS and 8mg  zofran/ 100mcg fentanyl.

## 2016-03-11 NOTE — Progress Notes (Signed)
Patient arrived to 2C16. CHG bath completed. VS stable. Patient resting at this time.

## 2016-03-11 NOTE — H&P (Signed)
Date: 03/11/2016               Patient Name:  Rickey Smith MRN: [id number]  DOB: 02/05/1966 Age / Sex: 50 y.o., male   PCP: No Pcp Per Patient              Medical Service: Internal Medicine Teaching Service              Attending Physician: Dr. Levert Feinstein, MD     Chief Complaint: abdominal pain, nausea & vomiting  History of Present Illness:  Rickey Smith, a 50 year old with a history of type 2 diabetes mellitus, presents with abdominal pain and vomiting. Patient reports that on the evening of 9/16 he abruptly felt nauseous and proceeded to vomit. The following morning, he noted the onset of severe abdominal pain which he describes as dull and constant with occasional interspersed moments where it is sharp. He says that the pain is focused in the center of his belly without radiation. He denies chest pain. He tried pepto bismol and TUMS for the pain without any relief. He denies any polyuria or polydipsia. He denies fatigue, dizziness or loss of consciousness. He denies any recent infections or sick contacts. His diabetes is managed with twice daily novolog 45 units and sliding scale insulin at mealtimes, along with metformin. He reports uninterrupted compliance with his medication regimen of late.   Meds: Insulin novolog 15-25 units tid, sliding scale Insulin aspart, 45 units bid Metformin 500mg  Amitriptyline 75mg    Allergies: Codeine: nausea & pruritis Gabapentin: nausea & diarrhea  Past Medical History: Type 2 Diabetes Mellitus Prostatic Abscess  Past Surgical History: Prostatic abscess drainage (08/16/15)  Family History: Father: diabetes Mother: diabetes (mild)  Social History: Patient lives alone outside of [place] but reports that he is only a few minutes away from his mother. He reports smoking a pack per day for 20 years but is now down to 1-2 cigarettes per day. He no longer drinks alcohol but reports that he used to drink "a bunch."  Review of  Systems: Positive for abdominal pain, nausea, vomiting Negative for loss of consciousness, diarrhea, chest pain  Physical Exam: Blood pressure 161/86, pulse (!) 128, temperature 97.7 F (36.5 C), temperature source Oral, resp. rate 22, height 6\' 1"  (1.854 m), weight 175 lb (79.4 kg), SpO2 98 %.  General: patient in distress, writhing and moaning in bed with occasional attempts to vomit HEENT: stringy sputum, poor dentition with many missing teeth; PERRL Neck: no masses or thyromegaly CV: tachycardic without murmurs, rubs or gallops Resp: CTA bilaterally Abdomen: bowel sounds normoactive, tenderness to palpation Extremities: no swelling, good pulses Skin: normal skin turgor without tenting Psych: oriented to person, place and time  Lab results: BMP: Na 132 (corrected: 142), K 6.3, Cl 94, CO2 11, BUN 28, Cr. 1.77, Glucose 576 Anion Gap 27 Lactic Acid 4.26 CBC: WBC 13.9, Hb 16, Hct 48, Plts 302 VBG: pH 7.22, pCO2 24; bicarb 10 UA: >1000 glucose; >80 ketones  Other results: EKG: sinus tachycardia  Assessment and Plan: Rickey Smith appears to be suffering from diabetic ketoacidosis and resultant acute kidney injury as evidenced by his symptoms of nausea, vomiting, and abdominal pain, along with his laboratory evidence of an anion gap metabolic acidosis with ketones in the urine. The AKI will likely resolve with increased hydration. --Admit to floor --Given his mild hypovolemia and normal corrected serum sodium, he can receive 1/2NS at 250 ml/hr until serum glucose reaches 200 (from  there, switch to D5 1/2NS at 150 ml/hr) --Begin IV insulin 10 units/hr until serum glucose reaches 200, then plan to switch to subcutaneous insulin --Monitor potassium levels every two hours --No bicarbonate necessary with pH at 7.22   This is a Psychologist, occupationalMedical Student Note.  The care of the patient was discussed with Dr. Lawerance BachBurns and the assessment and plan was formulated with their assistance.  Please see their note  for official documentation of the patient encounter.   Signed: Hessie Knowshomas C Elizardo Chilson, Medical Student 03/11/2016, 3:57 PM

## 2016-03-11 NOTE — Discharge Summary (Signed)
Name: Rickey Smith MRN: [id number] DOB: 08-09-1965 50 y.o. PCP: No Pcp Per Patient  Date of Admission: 03/11/2016 10:55 AM Date of Discharge: 03/14/2016 Attending Physician: Levert Feinstein, MD  Discharge Diagnosis: 1. DKA 2. HTN 3. Gastritis  Principal Problem:   DKA (diabetic ketoacidoses) (HCC) Active Problems:   GERD (gastroesophageal reflux disease)   Discharge Medications:   Medication List    STOP taking these medications   metoprolol tartrate 25 MG tablet Commonly known as:  LOPRESSOR     TAKE these medications   amitriptyline 75 MG tablet Commonly known as:  ELAVIL Take 1 tablet (75 mg total) by mouth at bedtime.   atorvastatin 40 MG tablet Commonly known as:  LIPITOR Take 1 tablet (40 mg total) by mouth daily at 6 PM.   famotidine 20 MG tablet Commonly known as:  PEPCID Take 1 tablet (20 mg total) by mouth 2 (two) times daily.   ibuprofen 200 MG tablet Commonly known as:  ADVIL,MOTRIN Take 400 mg by mouth every 6 (six) hours as needed for mild pain or moderate pain.   insulin aspart 100 UNIT/ML injection Commonly known as:  novoLOG Inject 15-25 Units into the skin 3 (three) times daily as needed for high blood sugar. Per sliding scale   insulin aspart protamine- aspart (70-30) 100 UNIT/ML injection Commonly known as:  NOVOLOG MIX 70/30 Inject 0.45 mLs (45 Units total) into the skin 2 (two) times daily with a meal.   lisinopril 20 MG tablet Commonly known as:  PRINIVIL,ZESTRIL Take 1 tablet (20 mg total) by mouth daily. Start taking on:  03/15/2016   metFORMIN 500 MG tablet Commonly known as:  GLUCOPHAGE Take 500 mg by mouth daily with breakfast.   ondansetron 4 MG tablet Commonly known as:  ZOFRAN Take 1 tablet (4 mg total) by mouth every 6 (six) hours. What changed:  when to take this  reasons to take this   pregabalin 100 MG capsule Commonly known as:  LYRICA Take 1 capsule (100 mg total) by mouth 2 (two) times daily.        Disposition and follow-up:   Mr.Rickey Smith was discharged from [facility] in Good condition.  At the hospital follow up visit please address:  1.  Diabetes.  Currently poorly controlled (A1c 10.1%), please reassess insulin regimen and encourage compliance.   2.  HTN. Started on lisinopril for HTN in DM.  Had previously been prescribed metoprolol for sinus tachycardia, but had not been taking it recently.  3.  Tachycardia.  Previously prescribed metoprolol for sinus tachycardia, but had been non-compliant.  Consider need to resume beta blocker for rate and hypertension.  4.  Hypokalemia.  Check K.  5.  Abdominal Pain. Thought to be gastritis/GERD.  Assess symptoms.  6.  Labs / imaging needed at time of follow-up: BMP  7.  Pending labs/ test needing follow-up: none  Follow-up Appointments: Follow-up Information    [facility]. Go on 03/21/2016.   Specialty:  Internal Medicine Why:  at 10:00am for an appointment in the [facility] with Dr Venetia Night. Please bring all of  your medications to the appointment.  Contact information: [address] [id number] mc [place] [address] [phone]          Hospital Course by problem list: Principal Problem:   DKA (diabetic ketoacidoses) (HCC) Active Problems:   GERD (gastroesophageal reflux disease)   1. DKA Mr [name] presented with 2 days  of worsening nausea, vomiting, and abdominal pain.  He reports compliance with his insulin regimen, had no symptoms of infection, but did use cocaine the day before onset of symptoms.  His thin body habitus, high insulin requirement, and low C-peptide may be suggestive of Type 1 or LADA classification.  He was was found to be hyperglycemic and acidotic with ketones in blood and urine.  He was given intravenous fluids and started on an insulin infusion and his DKA quickly resolved.  He began eating and drinking, but  intake was initially limited by abdominal pain.  He has a glucometer, and plenty of test strips, lancets, insulin, needles, and syringes at home, and was instructed to continue his home insulin regimen.  He will follow up in the [facility].  2. Gastritis After his DKA resolved, his nausea improved but he had persistent abdominal pain with oral intake.  He has a history of GERD and cocaine abuse could have precipitated worsening of his GERD/gastritis.  He was treated with a PPI and sucralfate, and his symptoms improved by the day of discharge.  He was discharged with a prescription for a famotidine since he said he would not be able to afford a PPI.  3. HTN He had a prescription for metoprolol in the EHR, but had not taken it recently.  Started lisinopril in consideration of his HTN and DM.  4. Hypokalemia His potassium was supplemented while his DKA was treated intially, but he developed further asymptomatic hypokalemia and was further supplemented with oral KCl.  He was also prescribed lisinopril prior to discharge, which may improve his hypokalemia.  Discharge Vitals:   BP 117/80 (BP Location: Right Arm)   Pulse 98   Temp 99.1 F (37.3 C) (Oral)   Resp 16   Ht 6\' 1"  (1.854 m)   Wt 143 lb 12.8 oz (65.2 kg)   SpO2 96%   BMI 18.97 kg/m   Pertinent Labs, Studies, and Procedures:   BMP Latest Ref Rng & Units 03/14/2016 03/13/2016 03/12/2016  Glucose 65 - 99 mg/dL 951(O) 841(Y) 606(T)  BUN 6 - 20 mg/dL 8 12 12   Creatinine 0.61 - 1.24 mg/dL 0.16(W) 1.09 3.23  Sodium 135 - 145 mmol/L 137 132(L) 133(L)  Potassium 3.5 - 5.1 mmol/L 3.3(L) 3.6 4.0  Chloride 101 - 111 mmol/L 101 99(L) 103  CO2 22 - 32 mmol/L 28 22 21(L)  Calcium 8.9 - 10.3 mg/dL 9.0 5.5(D) 3.2(K)   Component     Latest Ref Rng & Units 03/14/2016  Magnesium     1.7 - 2.4 mg/dL 2.3     Component     Latest Ref Rng & Units 03/11/2016  Hemoglobin A1C     4.8 - 5.6 % 10.1 (H)     Component     Latest Ref  Rng & Units 03/11/2016  pH, Ven     7.250 - 7.430 7.220 (L)  pCO2, Ven     44.0 - 60.0 mmHg 24.4 (L)  pO2, Ven     32.0 - 45.0 mmHg 41.0  Bicarbonate     20.0 - 28.0 mmol/L 10.0 (L)  TCO2     0 - 100 mmol/L 11  O2 Saturation     % 67.0  Acid-base deficit     0.0 - 2.0 mmol/L 16.0 (H)  Patient temperature      HIDE  Sample type      VENOUS   Drugs of Abuse     Component Value Date/Time  LABOPIA POSITIVE (A) 03/11/2016 1127   COCAINSCRNUR POSITIVE (A) 03/11/2016 1127   LABBENZ NONE DETECTED 03/11/2016 1127   AMPHETMU NONE DETECTED 03/11/2016 1127   THCU NONE DETECTED 03/11/2016 1127   LABBARB NONE DETECTED 03/11/2016 1127      Component     Latest Ref Rng & Units 03/11/2016  Beta-Hydroxybutyric Acid     0.05 - 0.27 mmol/L 6.56 (H)   Component     Latest Ref Rng & Units 03/12/2016  C-Peptide     1.1 - 4.4 ng/mL 0.2 (L)    Discharge Instructions: Discharge Instructions    Diet - low sodium heart healthy    Complete by:  As directed    Increase activity slowly    Complete by:  As directed      You were admitted to the hospital for diabetic ketoacidosis (DKA), which is what can happen when your diabetes gets out of control.  It is important that you check your blood sugars regularly and take your insulin as prescribed.  Drugs, infections, and missing your insulin are the most common reasons to go into DKA.  It is important that you do not run out of insulin, so make sure you get more if you start running low.  I have also prescribed you two new medicines.  Lisinopril is for your high blood pressure, and Famotidine (Pepcid) is for your stomach pain/irritation.  Both of these medicines are available for $4 per month at [facility].  [facility].  If you have nausea, vomiting, fevers, or confusion, please come back to the ED.  Signed: Alm BustardMatthew O'Sullivan, MD 03/14/2016, 11:07 AM   Pager: [phone]

## 2016-03-11 NOTE — ED Provider Notes (Signed)
[facility]-EMER[facility]ENCY DEPT Provider Note   CSN: [id number] Arrival date & time: 03/11/16  1055     History   Chief Complaint Chief Complaint  Patient presents with  . Abdominal Pain  . Hyperglycemia    HPI HARSHAL SIRMON is a 50 y.o. male.  HPI  Patient is a 50 year old male with past medical history of diabetes who comes in today complaining of nausea and abdominal pain.  Patient states he took his insulin last night and then went to stay at friend's house.  Patient states upon waking this morning he felt ill with some mild abdominal pain which continued to progress prompting his visit to the ED.  Patient denies fevers chills endorses nausea with emesis.  Patient denies chest pain or shortness of breath. Past Medical History:  Diagnosis Date  . Diabetes mellitus without complication (HCC)   . Migraine     Patient Active Problem List   Diagnosis Date Noted  . Sepsis (HCC) 08/12/2015  . Prostate abscess 08/11/2015  . Abscess 08/11/2015  . UTI (lower urinary tract infection) 08/11/2015  . Diabetic neuropathy (HCC) 08/10/2015  . Arterial hypotension   . Tachycardia 08/02/2015  . SIRS (systemic inflammatory response syndrome) (HCC) 08/02/2015  . GERD (gastroesophageal reflux disease) 10/26/2014  . Leukocytosis 10/23/2014  . Hyperkalemia 10/23/2014  . DKA, type 1 (HCC) 10/23/2014  . Hyperglycemia 07/13/2014  . Diabetic hyperosmolar non-ketotic state (HCC) 07/13/2014  . Diabetes mellitus with nonketotic hyperosmolarity (HCC) 07/13/2014  . Type 1 diabetes mellitus (HCC)   . DKA (diabetic ketoacidoses) (HCC) 04/26/2014  . Chest pain, ROMI, DDX pericarditis 04/26/2014  . Ex-smoker 04/26/2014  . Hyponatremia 04/26/2014  . Metabolic acidosis 04/26/2014    Past Surgical History:  Procedure Laterality Date  . TRANSURETHRAL RESECTION OF PROSTATE N/A 08/16/2015   Procedure: TRANSURETHRAL RESECTION DRAINAGE OF PROSTATE ABCESS;  Surgeon: Barron Alvine, MD;  Location: [facility];  Service:  Urology;  Laterality: N/A;       Home Medications    Prior to Admission medications   Medication Sig Start Date End Date Taking? Authorizing Provider  amitriptyline (ELAVIL) 75 MG tablet Take 1 tablet (75 mg total) by mouth at bedtime. 08/10/15  Yes Jaclyn Shaggy, MD  atorvastatin (LIPITOR) 40 MG tablet Take 1 tablet (40 mg total) by mouth daily at 6 PM. 10/26/14  Yes Estela Isaiah Blakes, MD  ibuprofen (ADVIL,MOTRIN) 200 MG tablet Take 400 mg by mouth every 6 (six) hours as needed for mild pain or moderate pain.    Yes Historical Provider, MD  insulin aspart (NOVOLOG) 100 UNIT/ML injection Inject 15-25 Units into the skin 3 (three) times daily as needed for high blood sugar. Per sliding scale   Yes Historical Provider, MD  insulin aspart protamine- aspart (NOVOLOG MIX 70/30) (70-30) 100 UNIT/ML injection Inject 0.45 mLs (45 Units total) into the skin 2 (two) times daily with a meal. 08/17/15  Yes Joseph Art, DO  metFORMIN (GLUCOPHAGE) 500 MG tablet Take 500 mg by mouth daily with breakfast.   Yes Historical Provider, MD  metoprolol tartrate (LOPRESSOR) 25 MG tablet Take 1 tablet (25 mg total) by mouth 2 (two) times daily. 08/10/15  Yes Jaclyn Shaggy, MD  ondansetron (ZOFRAN) 4 MG tablet Take 1 tablet (4 mg total) by mouth every 6 (six) hours. Patient taking differently: Take 4 mg by mouth every 6 (six) hours as needed for nausea.  11/28/15  Yes Jacalyn Lefevre, MD  pregabalin (LYRICA) 100 MG capsule Take 1 capsule (100 mg total)  by mouth 2 (two) times daily. Patient not taking: Reported on 03/11/2016 09/13/15   Pete Glatterawn T Langeland, MD    Family History Family History  Problem Relation Age of Onset  . Heart attack    . Prostate cancer    . Hypertension    . Hypertension Mother   . Cancer Father     Social History Social History  Substance Use Topics  . Smoking status: Current Some Day Smoker    Packs/day: 0.50    Years: 20.00    Types: Cigarettes  . Smokeless tobacco: Never Used  .  Alcohol use No     Allergies   Codeine and Gabapentin   Review of Systems Review of Systems  Constitutional: Positive for fatigue. Negative for chills and fever.  Respiratory: Negative for shortness of breath.   Cardiovascular: Negative for chest pain.  Gastrointestinal: Positive for abdominal pain, nausea and vomiting.  Endocrine: Positive for polyuria.  All other systems reviewed and are negative.    Physical Exam Updated Vital Signs BP 161/86   Pulse (!) 128   Temp 97.7 F (36.5 C) (Oral)   Resp 22   Ht 6\' 1"  (1.854 m)   Wt 79.4 kg   SpO2 98%   BMI 23.09 kg/m   Physical Exam  Constitutional: He appears well-developed and well-nourished. He appears distressed.  HENT:  Head: Normocephalic and atraumatic.  Eyes: Conjunctivae are normal.  Neck: Neck supple.  Cardiovascular: Normal rate and regular rhythm.   No murmur heard. Pulmonary/Chest: Effort normal and breath sounds normal. No respiratory distress.  Abdominal: Soft. There is tenderness. There is no rebound and no guarding.  Musculoskeletal: He exhibits no edema.  Neurological: He is alert.  Skin: Skin is warm and dry.  Psychiatric: He has a normal mood and affect.  Nursing note and vitals reviewed.    ED Treatments / Results  Labs (all labs ordered are listed, but only abnormal results are displayed) Labs Reviewed  CBC WITH DIFFERENTIAL/PLATELET - Abnormal; Notable for the following:       Result Value   WBC 13.9 (*)    Neutro Abs 12.8 (*)    All other components within normal limits  COMPREHENSIVE METABOLIC PANEL - Abnormal; Notable for the following:    Sodium 132 (*)    Potassium 6.3 (*)    Chloride 94 (*)    CO2 11 (*)    Glucose, Bld 576 (*)    BUN 28 (*)    Creatinine, Ser 1.77 (*)    Calcium 10.6 (*)    Total Protein 8.4 (*)    ALT 14 (*)    Total Bilirubin 1.6 (*)    GFR calc non Af Amer 43 (*)    GFR calc Af Amer 50 (*)    Anion gap 27 (*)    All other components within normal  limits  URINALYSIS, ROUTINE W REFLEX MICROSCOPIC (NOT AT [facility]) - Abnormal; Notable for the following:    Glucose, UA >1000 (*)    Ketones, ur >80 (*)    All other components within normal limits  CBG MONITORING, ED - Abnormal; Notable for the following:    Glucose-Capillary 418 (*)    All other components within normal limits  I-STAT CG4 LACTIC ACID, ED - Abnormal; Notable for the following:    Lactic Acid, Venous 4.26 (*)    All other components within normal limits  I-STAT VENOUS BLOOD GAS, ED - Abnormal; Notable for the following:    pH,  Ven 7.220 (*)    pCO2, Ven 24.4 (*)    Bicarbonate 10.0 (*)    Acid-base deficit 16.0 (*)    All other components within normal limits  CBG MONITORING, ED - Abnormal; Notable for the following:    Glucose-Capillary 431 (*)    All other components within normal limits  CBG MONITORING, ED - Abnormal; Notable for the following:    Glucose-Capillary 390 (*)    All other components within normal limits  CBG MONITORING, ED - Abnormal; Notable for the following:    Glucose-Capillary 570 (*)    All other components within normal limits  LIPASE, BLOOD  URINE MICROSCOPIC-ADD ON  BLOOD GAS, VENOUS  BETA-HYDROXYBUTYRIC ACID  I-STAT TROPOININ, ED  I-STAT CG4 LACTIC ACID, ED    EKG  EKG Interpretation  Date/Time:  Monday March 11 2016 11:01:24 EDT Ventricular Rate:  126 PR Interval:    QRS Duration: 108 QT Interval:  304 QTC Calculation: 441 R Axis:   73 Text Interpretation:  Sinus tachycardia Non-specific ST-t changes Confirmed by Denton Lank  MD, Caryn Bee ([id number]) on 03/11/2016 11:09:10 AM Also confirmed by Denton Lank  MD, Caryn Bee ([id number]), editor [place] CT, Jola Babinski [phone])  on 03/11/2016 11:36:59 AM       Radiology No results found.  Procedures Procedures (including critical care time)  Medications Ordered in ED Medications  sodium chloride 0.9 % bolus 1,000 mL (0 mLs Intravenous Stopped 03/11/16 1144)    And  0.9 %  sodium chloride infusion (125  mL/hr Intravenous New Bag/Given 03/11/16 1144)  dextrose 5 %-0.45 % sodium chloride infusion (not administered)  insulin regular bolus via infusion 0-10 Units (not administered)  insulin regular (NOVOLIN R,HUMULIN R) 250 Units in sodium chloride 0.9 % 250 mL (1 Units/mL) infusion (6.6 Units/hr Intravenous Rate/Dose Change 03/11/16 1439)  dextrose 50 % solution 25 mL (not administered)  sodium chloride 0.9 % bolus 1,000 mL (0 mLs Intravenous Stopped 03/11/16 1144)  insulin aspart (novoLOG) injection 10 Units (10 Units Intravenous Given 03/11/16 1113)  promethazine (PHENERGAN) injection 25 mg (25 mg Intravenous Given 03/11/16 1113)  insulin aspart (novoLOG) injection 5 Units (5 Units Intravenous Given 03/11/16 1330)  promethazine (PHENERGAN) injection 25 mg (25 mg Intravenous Given 03/11/16 1439)     Initial Impression / Assessment and Plan / ED Course  I have reviewed the triage vital signs and the nursing notes.  Pertinent labs & imaging results that were available during my care of the patient were reviewed by me and considered in my medical decision making (see chart for details).  Clinical Course    Patient is a 50 year old male with past medical history of diabetes who comes in today complaining of nausea and abdominal pain.  Patient states he took his insulin last night and then went to stay at friend's house.  Patient states upon waking this morning he felt ill with some mild abdominal pain which continued to progress prompting his visit to the ED.  Patient denies fevers chills endorses nausea with emesis.  Patient denies chest pain or shortness of breath.  Physical exam patient clear to auscultation bilaterally.  Patient appears nauseated.  Heart sounds normal abdomen soft with mild tenderness to palpation.  Remainder physical exam within normal limits.  Patient found to have significant hyperglycemia.  We will collect DKA labs and attempt to correct patient's BGL.  Pt found to be in DKA.  Pt started on hyperglycemia protocol. Will admit pt to hospitalist step down unit for further care.  Final Clinical Impressions(s) / ED Diagnoses   Final diagnoses:  None    New Prescriptions Current Discharge Medication List       Caren Griffins, MD 03/11/16 1513    Cathren Laine, MD 03/12/16 [phone]

## 2016-03-11 NOTE — ED Notes (Signed)
MD at bedside. Steinl. Pt Vomiting. Clean bag provided.

## 2016-03-11 NOTE — ED Notes (Signed)
Report given to 2C RN 

## 2016-03-11 NOTE — H&P (Signed)
Date: 03/11/2016               Patient Name:  Rickey Smith MRN: [id number]  DOB: December 24, 1965 Age / Sex: 50 y.o., male   PCP: No Pcp Per Patient         Medical Service: Internal Medicine Teaching Service         Attending Physician: Dr. Levert Feinstein, MD    First Contact: Malka So, MS3 Pager: [phone]  Second Contact: Dr. Peggyann Juba Pager: [phone]  Third Contact: Dr. Lawerance Bach Pager: [phone]       After Hours (After 5p/  First Contact Pager: [phone]  weekends / holidays): Second Contact Pager: [phone]   Chief Complaint: abdominal pain, nausea with vomiting  History of Present Illness: Rickey Smith is a 50 year old man with history of DM2 (on insulin, A1c 10.5% 07/2015) and HTN who presents with 1 day of nausea, vomiting, and abdominal pain.  He was in his usual state of health until 2 days before admission (9/16), when he started noticing some nausea in the evening. When he woke up on the morning of 9/17, the nausea continued and he developed severe epigastric abdominal pain.  Over the day prior to admission, he had 10-12 episodes of "reddish vomit", not sure if it was bloody. He describes the pain as alternating between generalized dull and achey and sharp and stabbing located mainly in the epigastric area. He reports compliance with his insulin regimen, taking 45U of 70/30 Novolog twice daily and sliding scale regular insulin with meals.   He denies any recent fever, chills, chest pain, shortness of breath, cold-like symptoms, lightheadedness, syncope, dysuria, increased urinary frequency. He has been drinking normally, about 2 gallons of water per day.   Patient reports this is his second episode of this type of presentation. The last episode occurred one year ago.   In the ED, he received 2L NS and was started on an insulin drip.  His abdominal pain, nausea, and vomiting are continuing.  Meds:  Current Meds  Medication Sig  . amitriptyline (ELAVIL) 75 MG tablet Take  1 tablet (75 mg total) by mouth at bedtime.  Marland Kitchen atorvastatin (LIPITOR) 40 MG tablet Take 1 tablet (40 mg total) by mouth daily at 6 PM.  . ibuprofen (ADVIL,MOTRIN) 200 MG tablet Take 400 mg by mouth every 6 (six) hours as needed for mild pain or moderate pain.   Marland Kitchen insulin aspart (NOVOLOG) 100 UNIT/ML injection Inject 15-25 Units into the skin 3 (three) times daily as needed for high blood sugar. Per sliding scale  . insulin aspart protamine- aspart (NOVOLOG MIX 70/30) (70-30) 100 UNIT/ML injection Inject 0.45 mLs (45 Units total) into the skin 2 (two) times daily with a meal.  . metFORMIN (GLUCOPHAGE) 500 MG tablet Take 500 mg by mouth daily with breakfast.  . metoprolol tartrate (LOPRESSOR) 25 MG tablet Take 1 tablet (25 mg total) by mouth 2 (two) times daily.  . ondansetron (ZOFRAN) 4 MG tablet Take 1 tablet (4 mg total) by mouth every 6 (six) hours. (Patient taking differently: Take 4 mg by mouth every 6 (six) hours as needed for nausea. )     Allergies: Allergies as of 03/11/2016 - Review Complete 03/11/2016  Allergen Reaction Noted  . Codeine Itching 01/24/2013  . Gabapentin Diarrhea and Nausea And Vomiting 07/31/2014   Past Medical History:  Diagnosis Date  . Diabetes mellitus without complication (HCC)   . Migraine     Family History:  Mother: HTN Father: T2DM   Allergies: Gabapentin and Codeine  Surgical History: Prostate surgery for prostate abscess  Social History: Patient lives by himself in a house. He is trying to quit tobacco- he used to smoke 1 ppd for 20 years and currently smokes 1-2 cigarettes per day. He denies cocaine or marijuana use. He was previously a heavy alcohol user, but has quit.   Review of Systems: A complete ROS was negative except as per HPI.  Physical Exam: Blood pressure 161/86, pulse (!) 128, temperature 97.7 F (36.5 C), temperature source Oral, resp. rate 22, height 6\' 1"  (1.854 m), weight 175 lb (79.4 kg), SpO2 98 %.  Physical Exam    Constitutional: He is oriented to person, place, and time.  Thin man rolling around in bad clutching his abdomen  HENT:  Oropharynx with thick secretions Poor dentition  Neck: Normal range of motion. Neck supple.  Cardiovascular:  Tachycardic, normal S1 S2, no murmurs, rubs, or gallops  Pulmonary/Chest: Effort normal and breath sounds normal.  Abdominal:  Soft, moderate epigastric tenderness to palpation, no guarding  Musculoskeletal: He exhibits no edema or tenderness.  Neurological: He is alert and oriented to person, place, and time.  Skin: Skin is warm and dry.  Psychiatric: He has a normal mood and affect. His behavior is normal.     CBC Latest Ref Rng & Units 03/11/2016 02/11/2016 11/28/2015  WBC 4.0 - 10.5 K/uL 13.9(H) 7.6 6.3  Hemoglobin 13.0 - 17.0 g/dL 16.1 09.6 12.2(L)  Hematocrit 39.0 - 52.0 % 48.0 41.6 38.3(L)  Platelets 150 - 400 K/uL 302 219 206   CMP Latest Ref Rng & Units 03/11/2016 02/11/2016 11/28/2015  Glucose 65 - 99 mg/dL 045(WU) 981(X) 914(N)  BUN 6 - 20 mg/dL 82(N) 10 20  Creatinine 0.61 - 1.24 mg/dL 5.62(Z) 3.08 6.57  Sodium 135 - 145 mmol/L 132(L) 135 130(L)  Potassium 3.5 - 5.1 mmol/L 6.3(HH) 3.9 4.4  Chloride 101 - 111 mmol/L 94(L) 102 95(L)  CO2 22 - 32 mmol/L 11(L) 24 27  Calcium 8.9 - 10.3 mg/dL 10.6(H) 9.6 8.8(L)  Total Protein 6.5 - 8.1 g/dL 8.4(O) - 7.0  Total Bilirubin 0.3 - 1.2 mg/dL 9.6(E) - 0.7  Alkaline Phos 38 - 126 U/L 78 - 67  AST 15 - 41 U/L 15 - 15  ALT 17 - 63 U/L 14(L) - 18    Urinalysis    Component Value Date/Time   COLORURINE YELLOW 03/11/2016 1117   APPEARANCEUR CLEAR 03/11/2016 1117   LABSPEC 1.027 03/11/2016 1117   PHURINE 5.0 03/11/2016 1117   GLUCOSEU >1000 (A) 03/11/2016 1117   HGBUR NEGATIVE 03/11/2016 1117   BILIRUBINUR NEGATIVE 03/11/2016 1117   BILIRUBINUR neg 08/10/2015 1041   KETONESUR >80 (A) 03/11/2016 1117   PROTEINUR NEGATIVE 03/11/2016 1117   UROBILINOGEN 2.0 08/10/2015 1041   UROBILINOGEN 0.2 10/23/2014  0817   NITRITE NEGATIVE 03/11/2016 1117   LEUKOCYTESUR NEGATIVE 03/11/2016 1117   BMP Latest Ref Rng & Units 03/11/2016 02/11/2016 11/28/2015  Glucose 65 - 99 mg/dL 952(WU) 132(G) 401(U)  BUN 6 - 20 mg/dL 27(O) 10 20  Creatinine 0.61 - 1.24 mg/dL 5.36(U) 4.40 3.47  Sodium 135 - 145 mmol/L 132(L) 135 130(L)  Potassium 3.5 - 5.1 mmol/L 6.3(HH) 3.9 4.4  Chloride 101 - 111 mmol/L 94(L) 102 95(L)  CO2 22 - 32 mmol/L 11(L) 24 27  Calcium 8.9 - 10.3 mg/dL 10.6(H) 9.6 8.8(L)   Component     Latest Ref Rng & Units 03/11/2016  pH, Ven     7.250 - 7.430 7.220 (L)  pCO2, Ven     44.0 - 60.0 mmHg 24.4 (L)  pO2, Ven     32.0 - 45.0 mmHg 41.0  Bicarbonate     20.0 - 28.0 mmol/L 10.0 (L)  TCO2     0 - 100 mmol/L 11  O2 Saturation     % 67.0  Acid-base deficit     0.0 - 2.0 mmol/L 16.0 (H)    Lactic Acid, Venous    Component Value Date/Time   LATICACIDVEN 4.26 (HH) 03/11/2016 1118   Lipase     Component Value Date/Time   LIPASE 18 03/11/2016 1052   Troponin (Point of Care Test)  Recent Labs  03/11/16 1117  TROPIPOC 0.01   EKG: sinus tach, nonspecific ST changes, no Q waves  Assessment & Plan by Problem: Principal Problem:   DKA (diabetic ketoacidoses) (HCC) Active Problems:   GERD (gastroesophageal reflux disease)  #DKA Anion gap metabolic acidosis with ketones consisted DKA in insulin-dependent diabetic.  No inciting events identified.  Reports compliance with his insulin.  No sign of ACS with EKG without ischemic changes and negative troponin.  Denies alcohol or drug use.  No signs of infection, afebrile, with no fevers, chills, respiratory or urinary symptoms, and no leukocytosis.  Corrected Na 140. -Insulin drip -Serial BMPs for anion gap and K -NS 250 mL/hr -Switch to 1/2NS at CBG <200 -Replete K if <5.3  #Acute Kidney Injury Baseline Cr <1.0, 1.77 on admission. -IVF -Repeat BMP  Dispo: Admit patient to Inpatient with expected length of stay greater than 2  midnights.  Signed: Alm BustardMatthew O'Sullivan, MD 03/11/2016, 2:32 PM  Pager: [phone]

## 2016-03-12 ENCOUNTER — Inpatient Hospital Stay (HOSPITAL_COMMUNITY): Payer: Self-pay

## 2016-03-12 ENCOUNTER — Encounter (HOSPITAL_COMMUNITY): Payer: Self-pay | Admitting: *Deleted

## 2016-03-12 DIAGNOSIS — Z794 Long term (current) use of insulin: Secondary | ICD-10-CM

## 2016-03-12 DIAGNOSIS — F1410 Cocaine abuse, uncomplicated: Secondary | ICD-10-CM

## 2016-03-12 DIAGNOSIS — D72829 Elevated white blood cell count, unspecified: Secondary | ICD-10-CM

## 2016-03-12 DIAGNOSIS — I10 Essential (primary) hypertension: Secondary | ICD-10-CM

## 2016-03-12 DIAGNOSIS — E1310 Other specified diabetes mellitus with ketoacidosis without coma: Principal | ICD-10-CM

## 2016-03-12 LAB — BASIC METABOLIC PANEL
ANION GAP: 12 (ref 5–15)
Anion gap: 10 (ref 5–15)
Anion gap: 10 (ref 5–15)
Anion gap: 9 (ref 5–15)
BUN: 20 mg/dL (ref 6–20)
BUN: 20 mg/dL (ref 6–20)
BUN: 18 mg/dL (ref 6–20)
BUN: 12 mg/dL (ref 6–20)
CHLORIDE: 103 mmol/L (ref 101–111)
CHLORIDE: 109 mmol/L (ref 101–111)
CHLORIDE: 110 mmol/L (ref 101–111)
CHLORIDE: 107 mmol/L (ref 101–111)
CO2: 20 mmol/L — AB (ref 22–32)
CO2: 20 mmol/L — AB (ref 22–32)
CO2: 21 mmol/L — ABNORMAL LOW (ref 22–32)
CO2: 19 mmol/L — AB (ref 22–32)
CREATININE: 0.93 mg/dL (ref 0.61–1.24)
CREATININE: 1.03 mg/dL (ref 0.61–1.24)
Calcium: 9.3 mg/dL (ref 8.9–10.3)
Calcium: 9 mg/dL (ref 8.9–10.3)
Calcium: 8.8 mg/dL — ABNORMAL LOW (ref 8.9–10.3)
Calcium: 8.5 mg/dL — ABNORMAL LOW (ref 8.9–10.3)
Creatinine, Ser: 0.86 mg/dL (ref 0.61–1.24)
Creatinine, Ser: 0.71 mg/dL (ref 0.61–1.24)
GFR calc Af Amer: >60 mL/min (ref >60)
GFR calc non Af Amer: >60 mL/min (ref >60)
GFR calc non Af Amer: >60 mL/min (ref >60)
GFR calc non Af Amer: >60 mL/min (ref >60)
GFR calc non Af Amer: >60 mL/min (ref >60)
GLUCOSE: 350 mg/dL — AB (ref 65–99)
Glucose, Bld: 131 mg/dL — ABNORMAL HIGH (ref 65–99)
Glucose, Bld: 205 mg/dL — ABNORMAL HIGH (ref 65–99)
Glucose, Bld: 173 mg/dL — ABNORMAL HIGH (ref 65–99)
POTASSIUM: 4 mmol/L (ref 3.5–5.1)
POTASSIUM: 3.8 mmol/L (ref 3.5–5.1)
POTASSIUM: 4 mmol/L (ref 3.5–5.1)
POTASSIUM: 3.5 mmol/L (ref 3.5–5.1)
SODIUM: 138 mmol/L (ref 135–145)
Sodium: 140 mmol/L (ref 135–145)
Sodium: 139 mmol/L (ref 135–145)
Sodium: 133 mmol/L — ABNORMAL LOW (ref 135–145)

## 2016-03-12 LAB — GLUCOSE, CAPILLARY
GLUCOSE-CAPILLARY: 128 mg/dL — AB (ref 65–99)
GLUCOSE-CAPILLARY: 162 mg/dL — AB (ref 65–99)
GLUCOSE-CAPILLARY: 180 mg/dL — AB (ref 65–99)
GLUCOSE-CAPILLARY: 191 mg/dL — AB (ref 65–99)
GLUCOSE-CAPILLARY: 200 mg/dL — AB (ref 65–99)
GLUCOSE-CAPILLARY: 320 mg/dL — AB (ref 65–99)
Glucose-Capillary: 139 mg/dL — ABNORMAL HIGH (ref 65–99)
Glucose-Capillary: 190 mg/dL — ABNORMAL HIGH (ref 65–99)
Glucose-Capillary: 171 mg/dL — ABNORMAL HIGH (ref 65–99)
Glucose-Capillary: 146 mg/dL — ABNORMAL HIGH (ref 65–99)
Glucose-Capillary: 126 mg/dL — ABNORMAL HIGH (ref 65–99)
Glucose-Capillary: 154 mg/dL — ABNORMAL HIGH (ref 65–99)
Glucose-Capillary: 154 mg/dL — ABNORMAL HIGH (ref 65–99)
Glucose-Capillary: 311 mg/dL — ABNORMAL HIGH (ref 65–99)

## 2016-03-12 LAB — CBC
HEMATOCRIT: 43.6 % (ref 39.0–52.0)
HEMOGLOBIN: 14.7 g/dL (ref 13.0–17.0)
MCH: 29.2 pg (ref 26.0–34.0)
MCHC: 33.7 g/dL (ref 30.0–36.0)
MCV: 86.7 fL (ref 78.0–100.0)
Platelets: 240 10*3/uL (ref 150–400)
RBC: 5.03 MIL/uL (ref 4.22–5.81)
RDW: 12.8 % (ref 11.5–15.5)
WBC: 20.4 10*3/uL — AB (ref 4.0–10.5)

## 2016-03-12 LAB — HEMOGLOBIN A1C
Hgb A1c MFr Bld: 10.1 % — ABNORMAL HIGH (ref 4.8–5.6)
MEAN PLASMA GLUCOSE: 243 mg/dL

## 2016-03-12 MED ORDER — SODIUM CHLORIDE 0.9 % IV SOLN
INTRAVENOUS | Status: DC
  Filled 2016-03-12: qty 2.5

## 2016-03-12 MED ORDER — INSULIN GLARGINE 100 UNIT/ML ~~LOC~~ SOLN
25.0000 [IU] | Freq: Once | SUBCUTANEOUS | Status: AC
  Administered 2016-03-12: 25 [IU] via SUBCUTANEOUS
  Filled 2016-03-12: qty 0.25

## 2016-03-12 MED ORDER — INSULIN GLARGINE 100 UNIT/ML ~~LOC~~ SOLN
25.0000 [IU] | Freq: Every day | SUBCUTANEOUS | Status: DC
  Filled 2016-03-12: qty 0.25

## 2016-03-12 MED ORDER — PROCHLORPERAZINE EDISYLATE 5 MG/ML IJ SOLN
10.0000 mg | Freq: Once | INTRAMUSCULAR | Status: AC
  Administered 2016-03-12: 10 mg via INTRAVENOUS
  Filled 2016-03-12: qty 2

## 2016-03-12 MED ORDER — DIPHENHYDRAMINE HCL 50 MG/ML IJ SOLN
25.0000 mg | Freq: Once | INTRAMUSCULAR | Status: AC
  Administered 2016-03-12: 25 mg via INTRAVENOUS
  Filled 2016-03-12: qty 1

## 2016-03-12 MED ORDER — INSULIN GLARGINE 100 UNIT/ML ~~LOC~~ SOLN
20.0000 [IU] | Freq: Every day | SUBCUTANEOUS | Status: AC
  Administered 2016-03-12: 20 [IU] via SUBCUTANEOUS
  Filled 2016-03-12: qty 0.2

## 2016-03-12 MED ORDER — POTASSIUM CHLORIDE 2 MEQ/ML IV SOLN
INTRAVENOUS | Status: DC
  Administered 2016-03-12: 09:00:00 via INTRAVENOUS
  Filled 2016-03-12 (×4): qty 1000

## 2016-03-12 MED ORDER — INSULIN ASPART 100 UNIT/ML ~~LOC~~ SOLN
0.0000 [IU] | Freq: Three times a day (TID) | SUBCUTANEOUS | Status: DC
  Administered 2016-03-12: 7 [IU] via SUBCUTANEOUS
  Administered 2016-03-13: 3 [IU] via SUBCUTANEOUS

## 2016-03-12 MED ORDER — INSULIN ASPART 100 UNIT/ML ~~LOC~~ SOLN
3.0000 [IU] | Freq: Three times a day (TID) | SUBCUTANEOUS | Status: DC
  Administered 2016-03-12: 3 [IU] via SUBCUTANEOUS

## 2016-03-12 MED ORDER — INSULIN ASPART 100 UNIT/ML ~~LOC~~ SOLN
8.0000 [IU] | Freq: Three times a day (TID) | SUBCUTANEOUS | Status: DC
  Administered 2016-03-13 – 2016-03-14 (×5): 8 [IU] via SUBCUTANEOUS

## 2016-03-12 MED ORDER — GI COCKTAIL ~~LOC~~
30.0000 mL | Freq: Once | ORAL | Status: AC
  Administered 2016-03-12: 30 mL via ORAL
  Filled 2016-03-12: qty 30

## 2016-03-12 NOTE — Progress Notes (Signed)
Inpatient Diabetes Program Recommendations  AACE/ADA: New Consensus Statement on Inpatient Glycemic Control (2015)  Target Ranges:  Prepandial:   less than 140 mg/dL      Peak postprandial:   less than 180 mg/dL (1-2 hours)      Critically ill patients:  140 - 180 mg/dL   Lab Results  Component Value Date   GLUCAP 154 (H) 03/12/2016   HGBA1C 10.1 (H) 03/11/2016   Diabetes history: Type 1 diabetes Outpatient Diabetes medications: Novolin 70/30- 45 units bid Current orders for Inpatient glycemic control:  Novolog 3 units tid with meals, Lantus 20 units q HS, Novolog sensitive tid with meals  Inpatient Diabetes Program Recommendations:    Note patient transitioned off insulin drip.  He states that he is still feeling badly but hopes he will be able to eat when his tray arrives.  Patient states that he was taking insulin and did not skip dose.  He does not have PCP, but states [facility]  He states that he buys his insulin from [employer] and that he does have meter and supplies.  A1C indicates sub-optimal control of diabetes. He likely will need resumption of 70/30 at d/c due to cost. Will follow.  Thanks, Beryl MeagerJenny Welma Mccombs, RN, BC-ADM Inpatient Diabetes Coordinator Pager [phone] (8a-5p)

## 2016-03-12 NOTE — Progress Notes (Signed)
   Subjective: Abdominal pain greatly improved, nausea improved though he did vomit after drinking some water in the early AM.  No cough, dyspnea, or dysuria.  Is now ready to try to eat and drink again.  He admitted to cocaine use on Saturday, 2 days prior to admission.  He clarified that he was started on insulin at the times of diabetes diagnosis.  Objective:  Vital signs in last 24 hours: Vitals:   03/12/16 0800 03/12/16 0900 03/12/16 1000 03/12/16 1200  BP: (!) 163/94 135/85 (!) 169/100 (!) 179/97  Pulse:    81  Resp: 15 (!) 23 (!) 5 10  Temp: 98.6 F (37 C)   97.6 F (36.4 C)  TempSrc: Oral   Oral  SpO2: 99% 100% 99% 98%  Weight:      Height:       Physical Exam  Constitutional: He is oriented to person, place, and time. He appears well-developed and well-nourished. No distress.  Cardiovascular: Normal rate and regular rhythm.   Pulmonary/Chest: Effort normal and breath sounds normal.  Abdominal:  Soft, no guarding, no distension, mild diffuse tenderness  Neurological: He is alert and oriented to person, place, and time.  Skin: Skin is warm and dry.  Psychiatric: He has a normal mood and affect. His behavior is normal.   Component     Latest Ref Rng & Units 03/11/2016  Opiates     NONE DETECTED POSITIVE (A)  COCAINE     NONE DETECTED POSITIVE (A)  Benzodiazepines     NONE DETECTED NONE DETECTED  Amphetamines     NONE DETECTED NONE DETECTED  Tetrahydrocannabinol     NONE DETECTED NONE DETECTED  Barbiturates     NONE DETECTED NONE DETECTED   CBC Latest Ref Rng & Units 03/12/2016 03/11/2016 02/11/2016  WBC 4.0 - 10.5 K/uL 20.4(H) 13.9(H) 7.6  Hemoglobin 13.0 - 17.0 g/dL 16.114.7 09.616.0 04.513.6  Hematocrit 39.0 - 52.0 % 43.6 48.0 41.6  Platelets 150 - 400 K/uL 240 302 219   BMP Latest Ref Rng & Units 03/12/2016 03/12/2016 03/12/2016  Glucose 65 - 99 mg/dL 409(W173(H) 119(J205(H) 478(G131(H)  BUN 6 - 20 mg/dL 18 20 20   Creatinine 0.61 - 1.24 mg/dL 9.560.86 2.130.93 0.861.03  Sodium 135 - 145 mmol/L  138 139 140  Potassium 3.5 - 5.1 mmol/L 3.5 3.8 4.0  Chloride 101 - 111 mmol/L 107 109 110  CO2 22 - 32 mmol/L 19(L) 20(L) 20(L)  Calcium 8.9 - 10.3 mg/dL 5.7(Q8.8(L) 9.0 9.3   CBG (last 3)   Recent Labs  03/12/16 0732 03/12/16 0843 03/12/16 0939  GLUCAP 162* 146* 126*   Chest Radiographs (03/12/2016): no active cardiopulmonary disease  Assessment/Plan:  Principal Problem:   DKA (diabetic ketoacidoses) (HCC) Active Problems:   GERD (gastroesophageal reflux disease)  #DKA Clinically improved, volume repleted, gap has closed, is now ready to eat and drink.  On 90U 70/30 daily at home with mealtime corrective.  Cocaine use was likely precipitating factor for his DKA. -Advance diet -25 U Lantus now -D/c insulin drip 2 hours later -25 U Lantus QHS -SSI -Check evening BMP and tomorrow morning  #HTN Not on home antihypertensives.  BPs 150s-170s/80s-90s with no chest pain or headache to suggest hypertensive urgency. -Consider adding ACE or thiazide tomorrow  Dispo: Anticipated discharge in approximately 2 day(s).   Alm BustardMatthew O'Sullivan, MD 03/12/2016, 1:38 PM Pager: [phone]

## 2016-03-12 NOTE — Progress Notes (Signed)
Subjective: Doing much better overnight. Several episodes of vomiting, but abdominal pain much improved. He has not yet eaten as he does not feel an appetite.  Objective: Vital signs in last 24 hours: Vitals:   03/12/16 0734 03/12/16 0800 03/12/16 0900 03/12/16 1000  BP:  (!) 163/94 135/85 (!) 169/100  Pulse:      Resp:  15 (!) 23 (!) 5  Temp:  98.6 F (37 C)    TempSrc: Oral Oral    SpO2:  99% 100% 99%  Weight:      Height:       Weight change:   Intake/Output Summary (Last 24 hours) at 03/12/16 1051 Last data filed at 03/12/16 0925  Gross per 24 hour  Intake           5564.5 ml  Output             1600 ml  Net           3964.5 ml   BP (!) 169/100   Pulse (!) 127   Temp 98.6 F (37 C) (Oral)   Resp (!) 5   Ht 6\' 1"  (1.854 m)   Wt 147 lb 1.6 oz (66.7 kg)   SpO2 99%   BMI 19.41 kg/m  General appearance: fatigued and mild distress Lungs: tachycardic, w/o MRG Heart: regular rate and rhythm, S1, S2 normal, no murmur, click, rub or gallop Abdomen: hyperactive bowel sounds, mildly tender to palpation  Lab Results: BMP Na 139, K 3.8, Cl 109, CO2 20, BUN 20, Cr. 0.93, Glucose 205, Anion Gap 10 CBC WBC 20.4, Hgb 14.7, Hct 43.6, Plts 240 Urine tox: positive for cocaine and opiates  Micro Results: Recent Results (from the past 240 hour(s))  MRSA PCR Screening     Status: None   Collection Time: 03/11/16  3:31 PM  Result Value Ref Range Status   MRSA by PCR NEGATIVE NEGATIVE Final    Comment:        The GeneXpert MRSA Assay (FDA approved for NASAL specimens only), is one component of a comprehensive MRSA colonization surveillance program. It is not intended to diagnose MRSA infection nor to guide or monitor treatment for MRSA infections.    Studies/Results: Dg Chest 2 View  Result Date: 03/12/2016 CLINICAL DATA:  DKA EXAM: CHEST  2 VIEW COMPARISON:  02/11/2016 FINDINGS: The heart size and mediastinal contours are within normal limits. Both lungs are clear. The  visualized skeletal structures show old left clavicular fracture with healing. IMPRESSION: No active cardiopulmonary disease. Electronically Signed   By: Alcide CleverMark  Lukens M.D.   On: 03/12/2016 09:15   Medications:  Scheduled Meds: . enoxaparin (LOVENOX) injection  40 mg Subcutaneous Q24H  . sodium chloride  1,000 mL Intravenous Once  . sodium chloride flush  3 mL Intravenous Q12H   Continuous Infusions: . sodium chloride Stopped (03/12/16 0049)  . dextrose 5 %-0.45% NaCl with KCl Pediatric custom IV fluid 125 mL/hr at 03/12/16 0940  . insulin (NOVOLIN-R) infusion 2.6 Units/hr (03/12/16 1046)   PRN Meds:.acetaminophen **OR** acetaminophen, morphine injection, ondansetron (ZOFRAN) IV Assessment/Plan:  Rickey Smith appears to be improving by both clinical and laboratory assessment. His anion gap has come down from 27 to 10 and his glucose has dropped from 576 to 126 on most recent capillary test. We will therefore plan to switch to his home regimen of subcutaneous insulin and encourage PO intake. We will continue D5 1/2 NS at 150 ml/hr.   We will continue to search for  a possible etiology of this episode of DKA. His elevated WBC since arrival points to a possible infectious etiology, but his urinalysis and chest x ray both came back within normal limits. --of note, his urine tox screen came back positive for cocaine; on discussion with patient, he says that he used cocaine Saturday evening when a friend came over, he denies use for "at least a few months" leading up to this instance.  This is a Psychologist, occupational Note.  The care of the patient was discussed with Dr. Peggyann Juba and the assessment and plan formulated with their assistance.  Please see their attached note for official documentation of the daily encounter.   LOS: 1 day   Hessie Knows, Medical Student 03/12/2016, 10:51 AM

## 2016-03-12 NOTE — Plan of Care (Signed)
Problem: Education: Goal: Knowledge of [facility] Education information/materials will improve Outcome: Progressing Patient aware that we are working to get his sugars under control and that once he comes off the stabilizer we can look at getting him back to eating again. Patient still complains of intermittent nausea and epigastric pain. He states the zofran did not relieve this and his pain hasn't gone away. MD made aware.   Problem: Safety: Goal: Ability to remain free from injury will improve Outcome: Progressing Patient has call light within reach, room kept uncluttered. Patient knows to call for assistance. No impulsive behavior on my shift.  Problem: Health Behavior/Discharge Planning: Goal: Ability to manage health-related needs will improve Outcome: Progressing Patient compliant with glucose stabilizer. Sugars maintained below 200 for majority of night shift. Anion gap closed. Pending patient toleration of food, will come off stabilizer.   Problem: Tissue Perfusion: Goal: Risk factors for ineffective tissue perfusion will decrease Outcome: Progressing Pt receives lovenox for VTE prophylaxis.

## 2016-03-13 DIAGNOSIS — E1165 Type 2 diabetes mellitus with hyperglycemia: Secondary | ICD-10-CM

## 2016-03-13 DIAGNOSIS — E1140 Type 2 diabetes mellitus with diabetic neuropathy, unspecified: Secondary | ICD-10-CM

## 2016-03-13 LAB — GLUCOSE, CAPILLARY
GLUCOSE-CAPILLARY: 221 mg/dL — AB (ref 65–99)
GLUCOSE-CAPILLARY: 139 mg/dL — AB (ref 65–99)
GLUCOSE-CAPILLARY: 149 mg/dL — AB (ref 65–99)
Glucose-Capillary: 206 mg/dL — ABNORMAL HIGH (ref 65–99)

## 2016-03-13 LAB — BASIC METABOLIC PANEL
Anion gap: 11 (ref 5–15)
BUN: 12 mg/dL (ref 6–20)
CHLORIDE: 99 mmol/L — AB (ref 101–111)
CO2: 22 mmol/L (ref 22–32)
CREATININE: 0.68 mg/dL (ref 0.61–1.24)
Calcium: 8.7 mg/dL — ABNORMAL LOW (ref 8.9–10.3)
GFR calc Af Amer: >60 mL/min (ref >60)
GLUCOSE: 250 mg/dL — AB (ref 65–99)
POTASSIUM: 3.6 mmol/L (ref 3.5–5.1)
SODIUM: 132 mmol/L — AB (ref 135–145)

## 2016-03-13 LAB — C-PEPTIDE: C PEPTIDE: 0.2 ng/mL — AB (ref 1.1–4.4)

## 2016-03-13 MED ORDER — INSULIN ASPART 100 UNIT/ML ~~LOC~~ SOLN
0.0000 [IU] | Freq: Three times a day (TID) | SUBCUTANEOUS | Status: DC
Start: 2016-03-13 — End: 2016-03-14
  Administered 2016-03-13: 2 [IU] via SUBCUTANEOUS
  Administered 2016-03-13 – 2016-03-14 (×2): 5 [IU] via SUBCUTANEOUS
  Administered 2016-03-14: 3 [IU] via SUBCUTANEOUS

## 2016-03-13 MED ORDER — DIPHENHYDRAMINE HCL 50 MG/ML IJ SOLN
25.0000 mg | Freq: Once | INTRAMUSCULAR | Status: AC
  Administered 2016-03-14: 25 mg via INTRAVENOUS
  Filled 2016-03-13: qty 1

## 2016-03-13 MED ORDER — POTASSIUM CHLORIDE CRYS ER 20 MEQ PO TBCR
40.0000 meq | EXTENDED_RELEASE_TABLET | Freq: Once | ORAL | Status: AC
  Administered 2016-03-13: 40 meq via ORAL
  Filled 2016-03-13: qty 2

## 2016-03-13 MED ORDER — INSULIN GLARGINE 100 UNIT/ML ~~LOC~~ SOLN
50.0000 [IU] | Freq: Every day | SUBCUTANEOUS | Status: DC
Start: 2016-03-13 — End: 2016-03-14
  Administered 2016-03-13: 50 [IU] via SUBCUTANEOUS
  Filled 2016-03-13 (×2): qty 0.5

## 2016-03-13 MED ORDER — SODIUM CHLORIDE 0.9 % IV SOLN
INTRAVENOUS | Status: AC
  Administered 2016-03-13: 11:00:00 via INTRAVENOUS

## 2016-03-13 MED ORDER — SUCRALFATE 1 GM/10ML PO SUSP
1.0000 g | Freq: Three times a day (TID) | ORAL | Status: DC
  Administered 2016-03-13 – 2016-03-14 (×6): 1 g via ORAL
  Filled 2016-03-13 (×5): qty 10

## 2016-03-13 MED ORDER — GI COCKTAIL ~~LOC~~
30.0000 mL | Freq: Three times a day (TID) | ORAL | Status: DC | PRN
  Administered 2016-03-13: 30 mL via ORAL
  Filled 2016-03-13: qty 30

## 2016-03-13 MED ORDER — LISINOPRIL 20 MG PO TABS
20.0000 mg | ORAL_TABLET | Freq: Every day | ORAL | Status: DC
  Administered 2016-03-13 – 2016-03-14 (×2): 20 mg via ORAL
  Filled 2016-03-13 (×2): qty 1

## 2016-03-13 MED ORDER — PANTOPRAZOLE SODIUM 40 MG PO TBEC
40.0000 mg | DELAYED_RELEASE_TABLET | Freq: Every day | ORAL | Status: DC
  Administered 2016-03-13 – 2016-03-14 (×2): 40 mg via ORAL
  Filled 2016-03-13 (×2): qty 1

## 2016-03-13 MED ORDER — PROCHLORPERAZINE EDISYLATE 5 MG/ML IJ SOLN
10.0000 mg | Freq: Once | INTRAMUSCULAR | Status: AC
  Administered 2016-03-14: 10 mg via INTRAVENOUS
  Filled 2016-03-13: qty 2

## 2016-03-13 MED ORDER — INSULIN ASPART 100 UNIT/ML ~~LOC~~ SOLN
0.0000 [IU] | Freq: Every day | SUBCUTANEOUS | Status: DC
Start: 2016-03-13 — End: 2016-03-14

## 2016-03-13 NOTE — Hospital Discharge Follow-Up (Signed)
Transitional Care Clinic Care Coordination Note:  Admit date:  03/11/16  Discharge date: TBD - possibly 03/14/16 Discharge Disposition: home Patient contact: cell # [phone] Emergency contact(s): mother: Brown Dunlap # [phone]  This Case Manager reviewed patient's EMR and determined patient would benefit from post-discharge medical management and chronic care management services through the [facility]. Patient has a history of DM- type 2, prostatic abscess, cocaine use, HTN. He has 6 ED visits and 3 hospital admissions in the past year.  He was admitted with abdominal pain and vomiting. Was diagnosed with DKA.   This Case Manager met with patient to discuss the services and medical management that can be provided at the [facility]. He has been followed by the [facility] in the past Patient verbalized understanding and agreed to receive post-discharge care at the [facility].   Patient scheduled for Transitional Care appointment on 03/21/2016 at 1000.  Clinic information and appointment time provided to patient. Appointment information also placed on AVS.  Assessment:       Home Environment: He stated that he lives alone but will probably be staying with his mother after discharge.  He and his mother live in [place].       Support System: his mother       Level of functioning: independent       Home DME: he said that he only has a glucometer.       Home care services: (services arranged prior to discharge or new services after discharge) - none       Transportation: He stated that he can drive and if he is not permitted to drive after discharge, he will have someone drive him to his appointments        Food/Nutrition: (ability to afford, access, use of any community resources) - He stated that he has no problems obtaining/affording food. He does not receive food stamps at this time. He noted that he had them at one time; but they were  " taken away."  He said that he doesn't receive disability and has no income.         Medications: (ability to afford, access, compliance, Pharmacy used) [facility] or [place]. He reported no problems with obtaining his medications.         Identified Barriers: no income, no insurance,not eligible for [employer] as he is not a resident of [place], no PCP. Cocaine use, only noted support is his mother.         PCP (Name, office location, phone number): No PCP.  Has been seen at the [facility] in the past  Patient Education: services provided at [place] communicated to [facility], RN CM  - Informed her that the patient is known to [facility] and has been followed by the [facility] in the past.

## 2016-03-13 NOTE — Progress Notes (Signed)
   Subjective: Has worsening bilateral hand and foot numbness/parasthesias this morning.  Still has abdominal pain and mild nausea with eating/drinking.   Objective:  Vital signs in last 24 hours: Vitals:   03/12/16 2007 03/12/16 2349 03/13/16 0312 03/13/16 0746  BP: 139/89 (!) 157/93 (!) 151/91 (!) 142/92  Pulse:      Resp: (!) 22 16 13 19   Temp: 99 F (37.2 C) 98.4 F (36.9 C) 98.7 F (37.1 C) 98.8 F (37.1 C)  TempSrc: Oral Oral Oral Oral  SpO2: 97% 97% 97% 96%  Weight:      Height:       Physical Exam  Constitutional: He is oriented to person, place, and time. He appears well-developed and well-nourished. No distress.  Cardiovascular:  Regular, tachycardic, normal S1S2  Pulmonary/Chest: Effort normal and breath sounds normal.  Abdominal:  Soft, no guarding, no distension, mild diffuse tenderness  Neurological: He is alert and oriented to person, place, and time.  Sensation grossly intact to light touch  Skin: Skin is warm and dry.  Psychiatric: He has a normal mood and affect. His behavior is normal.    CBC Latest Ref Rng & Units 03/12/2016 03/11/2016 02/11/2016  WBC 4.0 - 10.5 K/uL 20.4(H) 13.9(H) 7.6  Hemoglobin 13.0 - 17.0 g/dL 16.114.7 09.616.0 04.513.6  Hematocrit 39.0 - 52.0 % 43.6 48.0 41.6  Platelets 150 - 400 K/uL 240 302 219   BMP Latest Ref Rng & Units 03/13/2016 03/12/2016 03/12/2016  Glucose 65 - 99 mg/dL 409(W250(H) 119(J350(H) 478(G173(H)  BUN 6 - 20 mg/dL 12 12 18   Creatinine 0.61 - 1.24 mg/dL 9.560.68 2.130.71 0.860.86  Sodium 135 - 145 mmol/L 132(L) 133(L) 138  Potassium 3.5 - 5.1 mmol/L 3.6 4.0 3.5  Chloride 101 - 111 mmol/L 99(L) 103 107  CO2 22 - 32 mmol/L 22 21(L) 19(L)  Calcium 8.9 - 10.3 mg/dL 5.7(Q8.7(L) 4.6(N8.5(L) 6.2(X8.8(L)   CBG (last 3)   Recent Labs  03/12/16 1744 03/12/16 2159 03/13/16 0745  GLUCAP 320* 200* 221*    Assessment/Plan:  Principal Problem:   DKA (diabetic ketoacidoses) (HCC) Active Problems:   GERD (gastroesophageal reflux disease)  #DKA Resolved.  Cocaine  use was likely precipitating factor for his DKA.  Was on 90U 70/30 daily at home with mealtime corrective.   -Advance diet -50 U Lantus QHS -8U novolog TID with meals -SSI  #HTN Not on home antihypertensives.  BPs 150s-170s/80s-90s with no chest pain or headache to suggest hypertensive urgency. -Start lisinopril 20mg  daily  #Abdominal Pain Possible gastritis with cocaine use, abdominal pain with eating.  History of GERD. -PPI -Sucralfate  #Disposition Work with PT, will need to take reliable PO.  Dispo: Anticipated discharge in approximately 1 day(s).   Alm BustardMatthew O'Sullivan, MD 03/13/2016, 11:03 AM Pager: [phone]

## 2016-03-13 NOTE — Progress Notes (Signed)
Rickey Smith [id number] Tranferred to [id number]: 03/13/2016 8:57 PM Attending Provider: Levert FeinsteinJames M Granfortuna, MD    Rickey Smith is a 50 y.o. male patient transferred from [facility] awake, alert  & orientated  X 3,  Full Code.   IV site WDL:  with a transparent dsg that's clean dry and intact.  Allergies:   Allergies  Allergen Reactions  . Codeine Itching  . Gabapentin Diarrhea and Nausea And Vomiting     Past Medical History:  Diagnosis Date  . Diabetes mellitus without complication (HCC)   . Migraine     History:  obtained from patient  Pt orientation to unit, room and routine.  SR up x 2, fall risk assessment complete with Patient verbalizing understanding of risks associated with falls. Pt verbalizes an understanding of how to use the call bell and to call for help before getting out of bed.  Skin, clean-dry- intact without evidence of bruising, or skin tears.   No evidence of skin break down noted on exam.   Will cont to monitor and assist as needed.  Rickey Smith, Rickey Gotschall Nicole, RN 03/13/2016 8:57 PM

## 2016-03-13 NOTE — Progress Notes (Signed)
Inpatient Diabetes Program Recommendations  AACE/ADA: New Consensus Statement on Inpatient Glycemic Control (2015)  Target Ranges:  Prepandial:   less than 140 mg/dL      Peak postprandial:   less than 180 mg/dL (1-2 hours)      Critically ill patients:  140 - 180 mg/dL   Lab Results  Component Value Date   GLUCAP 206 (H) 03/13/2016   HGBA1C 10.1 (H) 03/11/2016    Review of Glycemic Control Results for Dolores HooseLEMONS, Zaden S (MRN [id number]) as of 03/13/2016 12:58  Ref. Range 03/12/2016 16:22 03/12/2016 17:44 03/12/2016 21:59 03/13/2016 07:45 03/13/2016 11:56  Glucose-Capillary Latest Ref Range: 65 - 99 mg/dL 409311 (H) 811320 (H) 914200 (H) 221 (H) 206 (H)   Inpatient Diabetes Program Recommendations:    Spoke with MD regarding d/c insulin regimen.  He states that patient will discharge home on 70/30 instead of Lantus/Novolog due to cost.  Note that patient's home 70/30 regimen is 45 units bid with meals.  Will follow.  Thanks, Beryl MeagerJenny Navid Lenzen, RN, BC-ADM Inpatient Diabetes Coordinator Pager [phone] (8a-5p)

## 2016-03-13 NOTE — Progress Notes (Signed)
Subjective: Continues to voice improvement in symptoms; is able to tolerate some PO eating and drinking without any episodes of nausea or vomiting in the past 24 hours. He does note, however, an increase in abdominal pain that occurs with eating.   During morning rounds, he mentions that his hands and feet are feeling numb and that he is no longer able to tolerate oral eating because of the abdominal pain.  Objective: Vital signs in last 24 hours: Vitals:   03/12/16 2007 03/12/16 2349 03/13/16 0312 03/13/16 0746  BP: 139/89 (!) 157/93 (!) 151/91 (!) 142/92  Pulse:      Resp: (!) 22 16 13 19   Temp: 99 F (37.2 C) 98.4 F (36.9 C) 98.7 F (37.1 C) 98.8 F (37.1 C)  TempSrc: Oral Oral Oral Oral  SpO2: 97% 97% 97% 96%  Weight:      Height:       Weight change:   Intake/Output Summary (Last 24 hours) at 03/13/16 1040 Last data filed at 03/13/16 0747  Gross per 24 hour  Intake              120 ml  Output             2940 ml  Net            -2820 ml   BP (!) 142/92 (BP Location: Right Arm)   Pulse 88   Temp 98.8 F (37.1 C) (Oral)   Resp 19   Ht 6\' 1"  (1.854 m)   Wt 147 lb 1.6 oz (66.7 kg)   SpO2 96%   BMI 19.41 kg/m  General appearance: fatigued and mild distress Lungs: tachycardic, w/o MRG Heart: regular rate and rhythm, S1, S2 normal, no murmur, click, rub or gallop Abdomen: hyperactive bowel sounds, mildly tender to palpation  Neuro: decreased strength with ankle dorsiflexion and hip flexion on his left side  Lab Results: BMP Na 132, K 3.6, Cl 99, CO2 22, BUN 12, Cr. 0.68, Glucose 250, Anion Gap 11   Micro Results: Recent Results (from the past 240 hour(s))  MRSA PCR Screening     Status: None   Collection Time: 03/11/16  3:31 PM  Result Value Ref Range Status   MRSA by PCR NEGATIVE NEGATIVE Final    Comment:        The GeneXpert MRSA Assay (FDA approved for NASAL specimens only), is one component of a comprehensive MRSA colonization surveillance program.  It is not intended to diagnose MRSA infection nor to guide or monitor treatment for MRSA infections.    Studies/Results: Dg Chest 2 View  Result Date: 03/12/2016 CLINICAL DATA:  DKA EXAM: CHEST  2 VIEW COMPARISON:  02/11/2016 FINDINGS: The heart size and mediastinal contours are within normal limits. Both lungs are clear. The visualized skeletal structures show old left clavicular fracture with healing. IMPRESSION: No active cardiopulmonary disease. Electronically Signed   By: Alcide CleverMark  Lukens M.D.   On: 03/12/2016 09:15   Medications:  Scheduled Meds: . enoxaparin (LOVENOX) injection  40 mg Subcutaneous Q24H  . insulin aspart  0-15 Units Subcutaneous TID WC  . insulin aspart  0-5 Units Subcutaneous QHS  . insulin aspart  8 Units Subcutaneous TID WC  . lisinopril  20 mg Oral Daily  . pantoprazole  40 mg Oral Daily  . sodium chloride  1,000 mL Intravenous Once  . sodium chloride flush  3 mL Intravenous Q12H  . sucralfate  1 g Oral TID WC & HS   Continuous  Infusions: . sodium chloride     PRN Meds:.acetaminophen **OR** acetaminophen, ondansetron (ZOFRAN) IV Assessment/Plan:  1. Diabetic Ketoacidosis: Mr. Partain appears to have a resolving DKA as his anion gap has remained close, his pH returned to normal limits, and his blood sugars are back under control. However, because he is not eating due to stomach pain, we will keep him on IV fluids as we continue his transition to home regimen of subcutaneous insulin. --Transfer out of step-down --Continue maintenance IV fluids --D/c telemetry --Home insulin regimen --Encourage PO intake and ambulation  2. Gastritis: Could be cause of recent pain with eating. Will try GI cocktail. --Sucralfate --PPI  3. Hypertension: Mr. Roderic Palau blood pressures have been elevated since arrival. We will begin him on an ACE Inhibitor. --Begin lisinopril  This is a Psychologist, occupational Note.  The care of the patient was discussed with Dr. Peggyann Juba and the  assessment and plan formulated with their assistance.  Please see their attached note for official documentation of the daily encounter.   LOS: 2 days   Hessie Knows, Medical Student 03/13/2016, 10:40 AM

## 2016-03-14 DIAGNOSIS — K2970 Gastritis, unspecified, without bleeding: Secondary | ICD-10-CM

## 2016-03-14 LAB — BASIC METABOLIC PANEL
Anion gap: 8 (ref 5–15)
BUN: 8 mg/dL (ref 6–20)
CHLORIDE: 101 mmol/L (ref 101–111)
CO2: 28 mmol/L (ref 22–32)
Calcium: 9 mg/dL (ref 8.9–10.3)
Creatinine, Ser: 0.6 mg/dL — ABNORMAL LOW (ref 0.61–1.24)
GFR calc Af Amer: >60 mL/min (ref >60)
GFR calc non Af Amer: >60 mL/min (ref >60)
GLUCOSE: 134 mg/dL — AB (ref 65–99)
POTASSIUM: 3.3 mmol/L — AB (ref 3.5–5.1)
Sodium: 137 mmol/L (ref 135–145)

## 2016-03-14 LAB — MAGNESIUM: Magnesium: 2.3 mg/dL (ref 1.7–2.4)

## 2016-03-14 LAB — GLUCOSE, CAPILLARY
Glucose-Capillary: 180 mg/dL — ABNORMAL HIGH (ref 65–99)
Glucose-Capillary: 230 mg/dL — ABNORMAL HIGH (ref 65–99)

## 2016-03-14 MED ORDER — FAMOTIDINE 20 MG PO TABS: 20.0000 mg | ORAL_TABLET | Freq: Two times a day (BID) | ORAL | 2 refills | Status: DC

## 2016-03-14 MED ORDER — POTASSIUM CHLORIDE CRYS ER 20 MEQ PO TBCR
40.0000 meq | EXTENDED_RELEASE_TABLET | Freq: Once | ORAL | Status: AC
  Administered 2016-03-14: 40 meq via ORAL
  Filled 2016-03-14 (×2): qty 2

## 2016-03-14 MED ORDER — LISINOPRIL 20 MG PO TABS: 20.0000 mg | ORAL_TABLET | Freq: Every day | ORAL | 3 refills | Status: DC

## 2016-03-14 NOTE — Progress Notes (Signed)
NURSING PROGRESS NOTE  Rickey HooseWilliam S Aracena [id number] Discharge Data: 03/14/2016 2:59 PM Attending Provider: No att. providers found PCP:No PCP Per Patient     Rickey HooseWilliam S Mcgregory to be D/C'd Home per MD order.  Discussed with the patient the After Visit Summary and all questions fully answered. All IV's discontinued with no bleeding noted. All belongings returned to patient for patient to take home.   Last Vital Signs:  Blood pressure 117/80, pulse 98, temperature 99.1 F (37.3 C), temperature source Oral, resp. rate 16, height 6\' 1"  (1.854 m), weight 65.2 kg (143 lb 12.8 oz), SpO2 96 %.  Discharge Medication List   Medication List    STOP taking these medications   metoprolol tartrate 25 MG tablet Commonly known as:  LOPRESSOR     TAKE these medications   amitriptyline 75 MG tablet Commonly known as:  ELAVIL Take 1 tablet (75 mg total) by mouth at bedtime.   atorvastatin 40 MG tablet Commonly known as:  LIPITOR Take 1 tablet (40 mg total) by mouth daily at 6 PM.   famotidine 20 MG tablet Commonly known as:  PEPCID Take 1 tablet (20 mg total) by mouth 2 (two) times daily.   ibuprofen 200 MG tablet Commonly known as:  ADVIL,MOTRIN Take 400 mg by mouth every 6 (six) hours as needed for mild pain or moderate pain.   insulin aspart 100 UNIT/ML injection Commonly known as:  novoLOG Inject 15-25 Units into the skin 3 (three) times daily as needed for high blood sugar. Per sliding scale   insulin aspart protamine- aspart (70-30) 100 UNIT/ML injection Commonly known as:  NOVOLOG MIX 70/30 Inject 0.45 mLs (45 Units total) into the skin 2 (two) times daily with a meal.   lisinopril 20 MG tablet Commonly known as:  PRINIVIL,ZESTRIL Take 1 tablet (20 mg total) by mouth daily. Start taking on:  03/15/2016   metFORMIN 500 MG tablet Commonly known as:  GLUCOPHAGE Take 500 mg by mouth daily with breakfast.   ondansetron 4 MG tablet Commonly known as:  ZOFRAN Take 1 tablet (4 mg  total) by mouth every 6 (six) hours. What changed:  when to take this  reasons to take this   pregabalin 100 MG capsule Commonly known as:  LYRICA Take 1 capsule (100 mg total) by mouth 2 (two) times daily.

## 2016-03-14 NOTE — Progress Notes (Signed)
Subjective: Reports continued improvement in symptoms. Was able to tolerate meals without any nausea or vomiting. Abdominal pain has subsided with initiation of medicines yesterday. Is ready for discharge.  Objective: Vital signs in last 24 hours: Vitals:   03/13/16 1552 03/13/16 1600 03/13/16 2042 03/14/16 0521  BP: 132/87 130/87 128/80 117/80  Pulse: 84  91 98  Resp: 17 19 17 16   Temp: 98.9 F (37.2 C)  98.4 F (36.9 C) 99.1 F (37.3 C)  TempSrc: Oral  Oral Oral  SpO2: 96% 97% 97% 96%  Weight:   143 lb 12.8 oz (65.2 kg)   Height:   6\' 1"  (1.854 m)    Weight change:   Intake/Output Summary (Last 24 hours) at 03/14/16 1053 Last data filed at 03/14/16 0521  Gross per 24 hour  Intake           901.25 ml  Output             2325 ml  Net         -1423.75 ml   BP 117/80 (BP Location: Right Arm)   Pulse 98   Temp 99.1 F (37.3 C) (Oral)   Resp 16   Ht 6\' 1"  (1.854 m)   Wt 143 lb 12.8 oz (65.2 kg)   SpO2 96%   BMI 18.97 kg/m  General appearance: upright in bed, no acute distress Lungs: Clear to auscultation bilaterally; good airflow; no dullness to percussion Heart: regular rate and rhythm, S1, S2 normal, no murmur, click, rub or gallop Abdomen: bowel sounds normoactive; belly non-tender  Lab Results: BMP Na 137, K 3.4, Cl 101, CO2 28, BUN 8, Cr. 0.6, Glucose 134, Anion Gap 8   Micro Results: Recent Results (from the past 240 hour(s))  MRSA PCR Screening     Status: None   Collection Time: 03/11/16  3:31 PM  Result Value Ref Range Status   MRSA by PCR NEGATIVE NEGATIVE Final    Comment:        The GeneXpert MRSA Assay (FDA approved for NASAL specimens only), is one component of a comprehensive MRSA colonization surveillance program. It is not intended to diagnose MRSA infection nor to guide or monitor treatment for MRSA infections.    Studies/Results: No results found. Medications:  Scheduled Meds: . enoxaparin (LOVENOX) injection  40 mg Subcutaneous  Q24H  . insulin aspart  0-15 Units Subcutaneous TID WC  . insulin aspart  0-5 Units Subcutaneous QHS  . insulin aspart  8 Units Subcutaneous TID WC  . insulin glargine  50 Units Subcutaneous QHS  . lisinopril  20 mg Oral Daily  . pantoprazole  40 mg Oral Daily  . sodium chloride  1,000 mL Intravenous Once  . sodium chloride flush  3 mL Intravenous Q12H  . sucralfate  1 g Oral TID WC & HS   Continuous Infusions:   PRN Meds:.acetaminophen **OR** acetaminophen, gi cocktail, ondansetron (ZOFRAN) IV Assessment/Plan:  1. Diabetic Ketoacidosis: Mr. Efferson' DKA appears to have resolved successfully. His anion gap has remained closed and blood sugars have remained within normal limits. He is walking and eating without any issues. He is therefore ready for discharge. --Continue home insulin regimen  2. Gastritis: Appears to resolve with initiation of GI Cocktail. Will discharge with medicines. --Sucralfate --PPI  3. Hypertension: Have improved significantly with initiation of lisinopril yesterday. Will discharge with this medicine.  This is a Psychologist, occupational Note.  The care of the patient was discussed with Dr. Peggyann Juba and the assessment  and plan formulated with their assistance.  Please see their attached note for official documentation of the daily encounter.   LOS: 3 days   Hessie Knowshomas C Hayven Croy, Medical Student 03/14/2016, 10:53 AM

## 2016-03-14 NOTE — Evaluation (Signed)
Physical Therapy Evaluation and Discharge Patient Details Name: Rickey HooseWilliam S Smith MRN: [id number] DOB: 03/27/1966 Today's Date: 03/14/2016   History of Present Illness  50 yo male with onset of DKA and PMHx:  DM, migraines, acute kidney injury.    Clinical Impression  Pt is up to walk with no issues, just a bit weak from being in bed and not being able to eat for the last couple days.  He is ready to DC home per MD arriving during visit and will anticipate no further PT needs for now.    Follow Up Recommendations No PT follow up    Equipment Recommendations  None recommended by PT    Recommendations for Other Services       Precautions / Restrictions Precautions Precautions: None Restrictions Weight Bearing Restrictions: No      Mobility  Bed Mobility Overal bed mobility: Modified Independent                Transfers Overall transfer level: Modified independent Equipment used: None                Ambulation/Gait Ambulation/Gait assistance: Min guard (for safety) Ambulation Distance (Feet): 200 Feet Assistive device: None (min guard for safety) Gait Pattern/deviations: Step-through pattern;Narrow base of support;Trunk flexed Gait velocity: normal Gait velocity interpretation: at or above normal speed for age/gender General Gait Details: longer strides and very mobile  Stairs Stairs:  (does not have any at home)          Wheelchair Mobility    Modified Rankin (Stroke Patients Only)       Balance Overall balance assessment: Modified Independent                                           Pertinent Vitals/Pain Pain Assessment: No/denies pain    Home Living Family/patient expects to be discharged to:: Private residence Living Arrangements: Alone Available Help at Discharge: Friend(s);Available PRN/intermittently Type of Home: House Home Access: Level entry     Home Layout: One level Home Equipment: None      Prior  Function Level of Independence: Independent               Hand Dominance        Extremity/Trunk Assessment   Upper Extremity Assessment: Overall WFL for tasks assessed           Lower Extremity Assessment: Overall WFL for tasks assessed      Cervical / Trunk Assessment: Normal  Communication   Communication: No difficulties  Cognition Arousal/Alertness: Awake/alert Behavior During Therapy: WFL for tasks assessed/performed Overall Cognitive Status: Within Functional Limits for tasks assessed                      General Comments General comments (skin integrity, edema, etc.): pt is up to walk with no difficulty, weak but mainly due to being in bed with no food for several days    Exercises     Assessment/Plan    PT Assessment Patent does not need any further PT services  PT Problem List            PT Treatment Interventions      PT Goals (Current goals can be found in the Care Plan section)  Acute Rehab PT Goals Patient Stated Goal: to get something to eat PT Goal Formulation: All assessment and education complete,  DC therapy    Frequency     Barriers to discharge        Co-evaluation               End of Session   Activity Tolerance: Patient tolerated treatment well;Other (comment) (started slow due to bedrest and limited intake) Patient left: in bed;with call bell/phone within reach;with bed alarm set (sitting bedside) Nurse Communication: Mobility status;Other (comment) (Pt wants to take a bath)         Time: 1610-9604 PT Time Calculation (min) (ACUTE ONLY): 21 min   Charges:   PT Evaluation $PT Eval Low Complexity: 1 Procedure     PT G CodesIvar Drape 2016/03/28, 11:32 AM    Samul Dada, PT MS Acute Rehab Dept. Number: [facility] [id number] and [facility] [phone]

## 2016-03-14 NOTE — Care Management Note (Signed)
Case Management Note  Patient Details  Name: Rickey HooseWilliam S Sperl MRN: [id number] Date of Birth: 12/01/1965  Subjective/Objective:             Admitted with DKA.      Action/Plan: Plan is to d/c to home today assuming care for self with help from mom if needed. Patient scheduled for Transitional Care appointment on 03/21/2016 at 1000.  Clinic information and appointment time provided to patient. Appointment information also placed on AVS.  Expected Discharge Date:   03/14/2016           Expected Discharge Plan:  Home/Self Care  In-House Referral:     Discharge planning Services  CM Consult  Status of Service:  Completed, signed off  If discussed at Long Length of Stay Meetings, dates discussed:    Additional Comments:  Epifanio LeschesCole, Ebubechukwu Jedlicka Hudson, RN 03/14/2016, 11:40 AM

## 2016-03-14 NOTE — Progress Notes (Signed)
   Subjective: Feels much improved, ready to go home.  Abdominal pain only mild, eating well.  No fevers, chills, or vomiting.  Walked with PT, stable with only mild weakness from his hospital stay.  Objective:  Vital signs in last 24 hours: Vitals:   03/13/16 1552 03/13/16 1600 03/13/16 2042 03/14/16 0521  BP: 132/87 130/87 128/80 117/80  Pulse: 84  91 98  Resp: 17 19 17 16   Temp: 98.9 F (37.2 C)  98.4 F (36.9 C) 99.1 F (37.3 C)  TempSrc: Oral  Oral Oral  SpO2: 96% 97% 97% 96%  Weight:   143 lb 12.8 oz (65.2 kg)   Height:   6\' 1"  (1.854 m)    Physical Exam  Constitutional: He is oriented to person, place, and time. He appears well-developed and well-nourished. No distress.  Cardiovascular: Normal rate, regular rhythm and normal heart sounds.   Pulmonary/Chest: Effort normal and breath sounds normal.  Abdominal: Soft. He exhibits no distension. There is no tenderness.  Neurological: He is alert and oriented to person, place, and time.  Skin: Skin is warm and dry.  Psychiatric: He has a normal mood and affect. His behavior is normal.    CBC Latest Ref Rng & Units 03/12/2016 03/11/2016 02/11/2016  WBC 4.0 - 10.5 K/uL 20.4(H) 13.9(H) 7.6  Hemoglobin 13.0 - 17.0 g/dL 16.114.7 09.616.0 04.513.6  Hematocrit 39.0 - 52.0 % 43.6 48.0 41.6  Platelets 150 - 400 K/uL 240 302 219   BMP Latest Ref Rng & Units 03/14/2016 03/13/2016 03/12/2016  Glucose 65 - 99 mg/dL 409(W134(H) 119(J250(H) 478(G350(H)  BUN 6 - 20 mg/dL 8 12 12   Creatinine 0.61 - 1.24 mg/dL 9.56(O0.60(L) 1.300.68 8.650.71  Sodium 135 - 145 mmol/L 137 132(L) 133(L)  Potassium 3.5 - 5.1 mmol/L 3.3(L) 3.6 4.0  Chloride 101 - 111 mmol/L 101 99(L) 103  CO2 22 - 32 mmol/L 28 22 21(L)  Calcium 8.9 - 10.3 mg/dL 9.0 7.8(I8.7(L) 6.9(G8.5(L)   CBG (last 3)   Recent Labs  03/13/16 2215 03/14/16 0955 03/14/16 1223  GLUCAP 149* 180* 230*    Assessment/Plan:  Principal Problem:   DKA (diabetic ketoacidoses) (HCC) Active Problems:   GERD (gastroesophageal reflux  disease)  #DKA Resolved.  Cocaine use was likely precipitating factor for his DKA.  Was on 90U 70/30 daily at home with mealtime corrective.   -Advance diet -50 U Lantus QHS -8U novolog TID with meals -SSI  #HTN Not on home antihypertensives.  BPs 150s-170s/80s-90s with no chest pain or headache to suggest hypertensive urgency. -Started lisinopril 20mg  daily  #Abdominal Pain Possible gastritis with cocaine use, abdominal pain with eating.  History of GERD. -PPI -Sucralfate  Dispo: Anticipated discharge today.  Alm BustardMatthew O'Sullivan, MD 03/14/2016, 1:36 PM Pager: [phone]

## 2016-03-14 NOTE — Discharge Instructions (Signed)
You were admitted to the hospital for diabetic ketoacidosis (DKA), which is what can happen when your diabetes gets out of control.  It is important that you check your blood sugars regularly and take your insulin as prescribed.  Drugs, infections, and missing your insulin are the most common reasons to go into DKA.  It is important that you do not run out of insulin, so make sure you get more if you start running low.  I have also prescribed you two new medicines.  Lisinopril is for your high blood pressure, and Famotidine (Pepcid) is for your stomach pain/irritation.  Both of these medicines are available for $4 per month at [facility].  Please go to your follow-up appointment at the [facility].  If you have nausea, vomiting, fevers, or confusion, please come back to the ED.   Diabetic Ketoacidosis Diabetic ketoacidosis is a life-threatening complication of diabetes. If it is not treated, it can cause severe dehydration and organ damage and can lead to a coma or death. CAUSES This condition develops when there is not enough of the hormone insulin in the body. Insulin helps the body to break down sugar for energy. Without insulin, the body cannot break down sugar, so it breaks down fats instead. This leads to the production of acids that are called ketones. Ketones are poisonous at high levels. This condition can be triggered by:  Stress on the body that is brought on by an illness.  Medicines that raise blood glucose levels.  Not taking diabetes medicine. SYMPTOMS Symptoms of this condition include:  Fatigue.  Weight loss.  Excessive thirst.  Light-headedness.  Fruity or sweet-smelling breath.  Excessive urination.  Vision changes.  Confusion or irritability.  Nausea.  Vomiting.  Rapid breathing.  Abdominal pain.  Feeling flushed. DIAGNOSIS This condition is diagnosed based on a medical history, a physical exam, and blood tests. You may also have a urine  test that checks for ketones. TREATMENT This condition may be treated with:  Fluid replacement. This may be done to correct dehydration.  Insulin injections. These may be given through the skin or through an IV tube.  Electrolyte replacement. Electrolytes, such as potassium and sodium, may be given in pill form or through an IV tube.  Antibiotic medicines. These may be prescribed if your condition was caused by an infection. HOME CARE INSTRUCTIONS Eating and Drinking  Drink enough fluids to keep your urine clear or pale yellow.  If you cannot eat, alternate between drinking fluids with sugar (such as juice) and salty fluids (such as broth or bouillon).  If you can eat, follow your usual diet and drink sugar-free liquids, such as water. Other Instructions  Take insulin as directed by your health care provider. Do not skip insulin injections. Do not use expired insulin.  If your blood sugar is over 240 mg/dL, monitor your urine ketones every 4-6 hours.  If you were prescribed an antibiotic medicine, finish all of it even if you start to feel better.  Rest and exercise only as directed by your health care provider.  If you get sick, call your health care provider and begin treatment quickly. Your body often needs extra insulin to fight an illness.  Check your blood glucose levels regularly. If your blood glucose is high, drink plenty of fluids. This helps to flush out ketones. SEEK MEDICAL CARE IF:  Your blood glucose level is too high or too low.  You have ketones in your urine.  You have a  fever.  You cannot eat.  You cannot tolerate fluids.  You have been vomiting for more than 2 hours.  You continue to have symptoms of this condition.  You develop new symptoms. SEEK IMMEDIATE MEDICAL CARE IF:  Your blood glucose levels continue to be high (elevated).  Your monitor reads "high" even when you are taking insulin.  You faint.  You have chest pain.  You have  trouble breathing.  You have a sudden, severe headache.  You have sudden weakness in one arm or one leg.  You have sudden trouble speaking or swallowing.  You have vomiting or diarrhea that gets worse after 3 hours.  You feel severely fatigued.  You have trouble thinking.  You have abdominal pain.  You are severely dehydrated. Symptoms of severe dehydration include:  Extreme thirst.  Dry mouth.  Blue lips.  Cold hands and feet.  Rapid breathing.   This information is not intended to replace advice given to you by your health care provider. Make sure you discuss any questions you have with your health care provider.   Document Released: 06/07/2000 Document Revised: 10/25/2014 Document Reviewed: 05/18/2014 Elsevier Interactive Patient Education [employer]2016 Elsevier Inc.

## 2016-03-15 ENCOUNTER — Telehealth: Payer: Self-pay

## 2016-03-15 NOTE — Telephone Encounter (Signed)
Transitional Care Clinic Post-discharge Follow-Up Phone Call:  Date of Discharge: 03/14/16 Principal Discharge Diagnosis(es): DKA, HTN, gastritis Call Completed: Yes                    With Whom: Patient Interpreter Needed: No              Language/Dialect: English     Please check all that apply:  X  Patient is knowledgeable of his/her condition(s) and/or treatment. X  Patient is caring for self at home.  ? Patient is receiving assist at home from family and/or caregiver. Family and/or caregiver is knowledgeable of patient's condition(s) and/or treatment. ? Patient is receiving home health services. If so, name of agency.     Medication Reconciliation:  X  Medication list reviewed with patient. X  Patient obtained all discharge medications-NO. Patient has not yet picked up discharge medications (famotidine 20 mg twice daily and lisinopril 20 mg daily).  Discussed importance of picking up medications as soon as possible, and patient indicated he plans to pick up medications today from [facility] [place], [place]. Patient informed that he is to STOP taking metoprolol tartrate 25 mg tablet per discharge summary. Patient verbalized understanding. Thoroughly reviewed patient's medication list. Patient has all previously prescribed medications. He noted that he does not feel like amitriptyline is working, and he noted medication "keeps him awake" at night. Informed patient to discuss with Dr. Venetia NightAmao at upcoming appointment, and patient verbalized understanding. Will also route to Dr. Venetia NightAmao so she is aware. Patient has both Novolog (15-25 units three times daily for high blood sugar per sliding scale) as well as Novolog 70/30 (45 units twice daily with a meal) on medication list. Discussed with Dr. Venetia NightAmao who indicated patient should hold Novolog sliding scale and just take Novolog 70/30 until he comes for office visit. Updated patient who indicated he has both Novolog and Novolog 70/30 at home. Informed  patient he is to HOLD Novolog (15-25 units three times daily for high blood sugar per sliding scale) and to take Novolog 70/30 (45 units twice daily with a meal) as prescribed. Patient verbalized understanding. Patient indicated his blood glucose last night prior to going to bed was "143" and fasting blood glucose this morning was "75." Informed patient to keep a blood glucose log and to bring blood glucose log to upcoming Transitional Care Clinic appointment on 03/21/16 at 1000 so Dr. Venetia NightAmao can review. Patient verbalized understanding.   Activities of Daily Living:  X  Independent ? Needs assist  ? Total Care   Community resources in place for patient:  X  None  ? Home Health/Home DME ? Assisted Living ? Support Group       Questions/Concerns discussed: This Case Manager placed call to patient to complete discharge follow-up phone call.  Inquired about patient's status, and he indicated he was feeling "much better than I was." Patient had no health concerns at this time. Reminded patient of upcoming Transitional Care Clinic appointment on 03/21/16 at 1000 with Dr. Venetia NightAmao. Patient indicated he would have transportation to upcoming appointment. No additional needs/concerns identified.

## 2016-03-20 ENCOUNTER — Telehealth: Payer: Self-pay

## 2016-03-20 NOTE — Telephone Encounter (Signed)
This Case Manager received return call from patient. Patient reminded of upcoming Transitional Care Clinic appointment on 03/21/16 at 1000. Patient aware and indicated he planned to be at appointment. Reminded patient to bring all medications and blood glucose log to upcoming appointment for Dr. Venetia NightAmao to review. Patient verbalized understanding. No additional needs/concerns identified at this time.

## 2016-03-20 NOTE — Telephone Encounter (Signed)
This Case Manager placed call to patient to remind him of upcoming Transitional Care Clinic appointment on 03/21/16 at 1000 with Dr. Venetia NightAmao. Call placed to #[phone]; unable to reach patient. Also placed call to patient's emergency contact, Estrellita LudwigJean Duhamel. Left message with her requesting patient return call to this Case Manager when able.

## 2016-03-21 ENCOUNTER — Telehealth: Payer: Self-pay

## 2016-03-21 ENCOUNTER — Inpatient Hospital Stay: Payer: Self-pay | Admitting: Family Medicine

## 2016-03-21 NOTE — Telephone Encounter (Signed)
Patient missed Transitional Care Clinic appointment on 03/21/16 at 1000. This Case Manager placed call to patient to discuss rescheduling appointment. Call placed to patient at #[phone]; however, unable to reach.  HIPPA compliant voicemail left requesting return call. In addition, call placed to patient's emergency contact, Rickey Smith (mother), at [phone]; unable to reach. HIPPA compliant voicemail left requesting return call from patient.

## 2016-03-29 ENCOUNTER — Telehealth: Payer: Self-pay

## 2016-03-29 NOTE — Telephone Encounter (Signed)
This Case Manager placed call to patient to discuss rescheduling appointment with Dr. Venetia NightAmao as patient missed his appointment on 03/21/16. Call placed to #[phone]; unable to reach patient. HIPPA compliant voicemail left requesting return call. In addition, call placed to patient's emergency contact, Estrellita LudwigJean Tuff (mother); HIPPA compliant message left with her requesting patient return call to this Case Manager when able.

## 2016-04-22 ENCOUNTER — Encounter (HOSPITAL_COMMUNITY): Payer: Self-pay

## 2016-04-22 ENCOUNTER — Emergency Department (HOSPITAL_COMMUNITY): Payer: Self-pay

## 2016-04-22 ENCOUNTER — Inpatient Hospital Stay (HOSPITAL_COMMUNITY)
Admission: EM | Admit: 2016-04-22 | Discharge: 2016-04-24 | DRG: 638 | Disposition: A | Discharge status: Home / Self Care | Payer: Self-pay | Attending: Internal Medicine | Admitting: Internal Medicine

## 2016-04-22 DIAGNOSIS — R400 Somnolence: Secondary | ICD-10-CM

## 2016-04-22 DIAGNOSIS — R112 Nausea with vomiting, unspecified: Secondary | ICD-10-CM

## 2016-04-22 DIAGNOSIS — F17210 Nicotine dependence, cigarettes, uncomplicated: Secondary | ICD-10-CM | POA: Diagnosis present

## 2016-04-22 DIAGNOSIS — E861 Hypovolemia: Secondary | ICD-10-CM | POA: Diagnosis present

## 2016-04-22 DIAGNOSIS — F1410 Cocaine abuse, uncomplicated: Secondary | ICD-10-CM | POA: Diagnosis present

## 2016-04-22 DIAGNOSIS — E109 Type 1 diabetes mellitus without complications: Secondary | ICD-10-CM

## 2016-04-22 DIAGNOSIS — Z794 Long term (current) use of insulin: Secondary | ICD-10-CM

## 2016-04-22 DIAGNOSIS — R402212 Coma scale, best verbal response, none, at arrival to emergency department: Secondary | ICD-10-CM | POA: Diagnosis present

## 2016-04-22 DIAGNOSIS — R402112 Coma scale, eyes open, never, at arrival to emergency department: Secondary | ICD-10-CM | POA: Diagnosis present

## 2016-04-22 DIAGNOSIS — Z9119 Patient's noncompliance with other medical treatment and regimen: Secondary | ICD-10-CM

## 2016-04-22 DIAGNOSIS — E1110 Type 2 diabetes mellitus with ketoacidosis without coma: Secondary | ICD-10-CM | POA: Diagnosis present

## 2016-04-22 DIAGNOSIS — I951 Orthostatic hypotension: Secondary | ICD-10-CM | POA: Diagnosis present

## 2016-04-22 DIAGNOSIS — R4189 Other symptoms and signs involving cognitive functions and awareness: Secondary | ICD-10-CM | POA: Diagnosis present

## 2016-04-22 DIAGNOSIS — R64 Cachexia: Secondary | ICD-10-CM | POA: Diagnosis present

## 2016-04-22 DIAGNOSIS — Z79899 Other long term (current) drug therapy: Secondary | ICD-10-CM

## 2016-04-22 DIAGNOSIS — R402352 Coma scale, best motor response, localizes pain, at arrival to emergency department: Secondary | ICD-10-CM | POA: Diagnosis present

## 2016-04-22 DIAGNOSIS — R778 Other specified abnormalities of plasma proteins: Secondary | ICD-10-CM | POA: Diagnosis present

## 2016-04-22 DIAGNOSIS — N179 Acute kidney failure, unspecified: Secondary | ICD-10-CM | POA: Diagnosis present

## 2016-04-22 DIAGNOSIS — E1111 Type 2 diabetes mellitus with ketoacidosis with coma: Principal | ICD-10-CM | POA: Diagnosis present

## 2016-04-22 DIAGNOSIS — Z681 Body mass index (BMI) 19 or less, adult: Secondary | ICD-10-CM

## 2016-04-22 LAB — URINALYSIS, ROUTINE W REFLEX MICROSCOPIC
Bilirubin Urine: NEGATIVE
HGB URINE DIPSTICK: NEGATIVE
Leukocytes, UA: NEGATIVE
Nitrite: NEGATIVE
PROTEIN: NEGATIVE mg/dL
Specific Gravity, Urine: 1.028 (ref 1.005–1.030)
pH: 5.5 (ref 5.0–8.0)

## 2016-04-22 LAB — CBC WITH DIFFERENTIAL/PLATELET
Basophils Absolute: 0 10*3/uL (ref 0.0–0.1)
Basophils Relative: 0 %
Eosinophils Absolute: 0.1 10*3/uL (ref 0.0–0.7)
Eosinophils Relative: 1 %
HEMATOCRIT: 48.6 % (ref 39.0–52.0)
Hemoglobin: 15.9 g/dL (ref 13.0–17.0)
LYMPHS PCT: 14 %
Lymphs Abs: 1.2 10*3/uL (ref 0.7–4.0)
MCH: 29.2 pg (ref 26.0–34.0)
MCHC: 32.7 g/dL (ref 30.0–36.0)
MCV: 89.3 fL (ref 78.0–100.0)
MONO ABS: 0.4 10*3/uL (ref 0.1–1.0)
MONOS PCT: 4 %
NEUTROS ABS: 7.3 10*3/uL (ref 1.7–7.7)
Neutrophils Relative %: 81 %
Platelets: 267 10*3/uL (ref 150–400)
RBC: 5.44 MIL/uL (ref 4.22–5.81)
RDW: 13 % (ref 11.5–15.5)
WBC: 9 10*3/uL (ref 4.0–10.5)

## 2016-04-22 LAB — COMPREHENSIVE METABOLIC PANEL
ALT: 30 U/L (ref 17–63)
ANION GAP: 22 — AB (ref 5–15)
AST: 17 U/L (ref 15–41)
Albumin: 4.8 g/dL (ref 3.5–5.0)
Alkaline Phosphatase: 86 U/L (ref 38–126)
BILIRUBIN TOTAL: 1.4 mg/dL — AB (ref 0.3–1.2)
BUN: 18 mg/dL (ref 6–20)
CO2: 12 mmol/L — ABNORMAL LOW (ref 22–32)
Calcium: 9.4 mg/dL (ref 8.9–10.3)
Chloride: 99 mmol/L — ABNORMAL LOW (ref 101–111)
Creatinine, Ser: 1.28 mg/dL — ABNORMAL HIGH (ref 0.61–1.24)
Glucose, Bld: 320 mg/dL — ABNORMAL HIGH (ref 65–99)
POTASSIUM: 5.9 mmol/L — AB (ref 3.5–5.1)
Sodium: 133 mmol/L — ABNORMAL LOW (ref 135–145)
TOTAL PROTEIN: 8.1 g/dL (ref 6.5–8.1)

## 2016-04-22 LAB — I-STAT VENOUS BLOOD GAS, ED
Acid-base deficit: 16 mmol/L — ABNORMAL HIGH (ref 0.0–2.0)
Bicarbonate: 11.7 mmol/L — ABNORMAL LOW (ref 20.0–28.0)
O2 Saturation: 35 %
PCO2 VEN: 34.7 mmHg — AB (ref 44.0–60.0)
PH VEN: 7.135 — AB (ref 7.250–7.430)
TCO2: 13 mmol/L (ref 0–100)
pO2, Ven: 27 mmHg — CL (ref 32.0–45.0)

## 2016-04-22 LAB — CBG MONITORING, ED: Glucose-Capillary: 325 mg/dL — ABNORMAL HIGH (ref 65–99)

## 2016-04-22 LAB — I-STAT CG4 LACTIC ACID, ED: LACTIC ACID, VENOUS: 1.5 mmol/L (ref 0.5–1.9)

## 2016-04-22 LAB — URINE MICROSCOPIC-ADD ON: RBC / HPF: NONE SEEN RBC/hpf (ref 0–5)

## 2016-04-22 LAB — ACETAMINOPHEN LEVEL

## 2016-04-22 LAB — ETHANOL: Alcohol, Ethyl (B): <5 mg/dL (ref <5)

## 2016-04-22 LAB — RAPID URINE DRUG SCREEN, HOSP PERFORMED
AMPHETAMINES: NOT DETECTED
Barbiturates: NOT DETECTED
Benzodiazepines: NOT DETECTED
Cocaine: POSITIVE — AB
Opiates: POSITIVE — AB
Tetrahydrocannabinol: NOT DETECTED

## 2016-04-22 LAB — SALICYLATE LEVEL

## 2016-04-22 MED ORDER — SODIUM CHLORIDE 0.9 % IV BOLUS (SEPSIS)
1000.0000 mL | Freq: Once | INTRAVENOUS | Status: AC
Start: 2016-04-22 — End: 2016-04-22
  Administered 2016-04-22: 1000 mL via INTRAVENOUS

## 2016-04-22 MED ORDER — SODIUM CHLORIDE 0.9 % IV SOLN
INTRAVENOUS | Status: DC
  Administered 2016-04-22: 3.2 [IU]/h via INTRAVENOUS
  Filled 2016-04-22: qty 2.5

## 2016-04-22 MED ORDER — SODIUM CHLORIDE 0.9 % IV SOLN
INTRAVENOUS | Status: DC
  Administered 2016-04-22: 125 mL/h via INTRAVENOUS

## 2016-04-22 MED ORDER — NALOXONE HCL 0.4 MG/ML IJ SOLN
0.4000 mg | Freq: Once | INTRAMUSCULAR | Status: AC
  Administered 2016-04-22: 0.4 mg via INTRAVENOUS
  Filled 2016-04-22: qty 1

## 2016-04-22 MED ORDER — ONDANSETRON HCL 4 MG/2ML IJ SOLN
4.0000 mg | Freq: Once | INTRAMUSCULAR | Status: AC
Start: 2016-04-22 — End: 2016-04-22
  Administered 2016-04-22: 4 mg via INTRAVENOUS
  Filled 2016-04-22: qty 2

## 2016-04-22 MED ORDER — DEXTROSE-NACL 5-0.45 % IV SOLN
INTRAVENOUS | Status: DC
  Administered 2016-04-23: 02:00:00 via INTRAVENOUS

## 2016-04-22 NOTE — ED Triage Notes (Signed)
Per EMS pt from home with family; pt was sleeping all day accorrding to family; Pt was called for dinner and was found unresponsive on the floor; Pt was given narcan at the scene; pt responds to pain full stimuli and pupils are reactive; Pt pulls away when lifting arm; pt has not opened eyes for staff on arrival but breathing is regular; pt has hx of substance abuse, DKA. Pt still remains unresponsive on arrival.

## 2016-04-22 NOTE — ED Provider Notes (Signed)
I have personally seen and examined the patient. I have reviewed the documentation on PMH/FH/Soc Hx. I have discussed the plan of care with the resident and patient.  I have reviewed and agree with the resident's documentation. Please see associated encounter note.  Briefly the patient is a 50 year old male with a history of diabetes and polysubstance abuse who presented to the ED with several hours of altered mental status. Patient was found by the mother who reports that the patient has had nausea and vomiting for 2-3 days.  On review of records patient has presented like this in the past and found to be in DKA. Also possible concern for illicit drug use. Mental status improved following Narcan. Labs consistent for diabetic ketoacidosis. IV fluids and insulin drip initiated. Admitted for further management.  CRITICAL CARE Performed by: Amadeo GarnetPedro Eduardo Monterio Bob Total critical care time: 35 minutes Critical care time was exclusive of separately billable procedures and treating other patients. Critical care was necessary to treat or prevent imminent or life-threatening deterioration. Critical care was time spent personally by me on the following activities: development of treatment plan with patient and/or surrogate as well as nursing, discussions with consultants, evaluation of patient's response to treatment, examination of patient, obtaining history from patient or surrogate, ordering and performing treatments and interventions, ordering and review of laboratory studies, ordering and review of radiographic studies, pulse oximetry and re-evaluation of patient's condition.    EKG Interpretation  Date/Time:  Monday April 22 2016 20:48:29 EDT Ventricular Rate:  107 PR Interval:    QRS Duration: 90 QT Interval:  316 QTC Calculation: 422 R Axis:   76 Text Interpretation:  Sinus tachycardia Right atrial enlargement No significant change since last tracing Confirmed by [facility] MD, Runell Kovich ([id number]) on  04/22/2016 9:59:28 PM         Nira ConnPedro Eduardo Ewa Hipp, MD 04/25/16 [phone]

## 2016-04-22 NOTE — ED Provider Notes (Signed)
[facility]-EMERGENCY DEPT Provider Note   CSN: [id number] Arrival date & time: 04/22/16  2042     History   Chief Complaint Chief Complaint  Patient presents with  . Fatigue    HPI Rickey HooseWilliam S Smith is a 50 y.o. male.  Patient presents after he was found somnolent by his mother, was reportedly sleeping all day. On arrival was unable to provide history but later when more awake stated that he had been having nausea, vomiting, diarrhea for the last 2-3 days. Did not take his insulin today. No history of falls or head injury.   The history is provided by the patient and the EMS personnel. No language interpreter was used.  Altered Mental Status   This is a new problem. The current episode started 3 to 5 hours ago. The problem has been gradually improving. Associated symptoms include confusion, somnolence and unresponsiveness. Risk factors: Diabetes, concern for possible illicit drug use. His past medical history is significant for diabetes.    Past Medical History:  Diagnosis Date  . Diabetes mellitus without complication (HCC)   . Migraine     Patient Active Problem List   Diagnosis Date Noted  . Sepsis (HCC) 08/12/2015  . Prostate abscess 08/11/2015  . Abscess 08/11/2015  . UTI (lower urinary tract infection) 08/11/2015  . Diabetic neuropathy (HCC) 08/10/2015  . Arterial hypotension   . Tachycardia 08/02/2015  . SIRS (systemic inflammatory response syndrome) (HCC) 08/02/2015  . GERD (gastroesophageal reflux disease) 10/26/2014  . Leukocytosis 10/23/2014  . Hyperkalemia 10/23/2014  . DKA, type 1 (HCC) 10/23/2014  . Hyperglycemia 07/13/2014  . Diabetic hyperosmolar non-ketotic state (HCC) 07/13/2014  . Diabetes mellitus with nonketotic hyperosmolarity (HCC) 07/13/2014  . Type 1 diabetes mellitus (HCC)   . DKA (diabetic ketoacidoses) (HCC) 04/26/2014  . Chest pain, ROMI, DDX pericarditis 04/26/2014  . Ex-smoker 04/26/2014  . Hyponatremia 04/26/2014  . Metabolic acidosis  04/26/2014    Past Surgical History:  Procedure Laterality Date  . TRANSURETHRAL RESECTION OF PROSTATE N/A 08/16/2015   Procedure: TRANSURETHRAL RESECTION DRAINAGE OF PROSTATE ABCESS;  Surgeon: Barron Alvineavid Grapey, MD;  Location: [facility];  Service: Urology;  Laterality: N/A;       Home Medications    Prior to Admission medications   Medication Sig Start Date End Date Taking? Authorizing Provider  insulin aspart protamine- aspart (NOVOLOG MIX 70/30) (70-30) 100 UNIT/ML injection Inject 0.45 mLs (45 Units total) into the skin 2 (two) times daily with a meal. 08/17/15  Yes Joseph ArtJessica U Vann, DO  amitriptyline (ELAVIL) 75 MG tablet Take 1 tablet (75 mg total) by mouth at bedtime. Patient not taking: Reported on 04/22/2016 08/10/15   Jaclyn ShaggyEnobong Amao, MD  atorvastatin (LIPITOR) 40 MG tablet Take 1 tablet (40 mg total) by mouth daily at 6 PM. Patient not taking: Reported on 04/22/2016 10/26/14   Henderson CloudEstela Y Hernandez Acosta, MD  famotidine (PEPCID) 20 MG tablet Take 1 tablet (20 mg total) by mouth 2 (two) times daily. Patient not taking: Reported on 04/22/2016 03/14/16 06/12/16  Alm BustardMatthew O'Sullivan, MD  lisinopril (PRINIVIL,ZESTRIL) 20 MG tablet Take 1 tablet (20 mg total) by mouth daily. Patient not taking: Reported on 04/22/2016 03/15/16 07/13/16  Alm BustardMatthew O'Sullivan, MD  ondansetron (ZOFRAN) 4 MG tablet Take 1 tablet (4 mg total) by mouth every 6 (six) hours. Patient not taking: Reported on 04/22/2016 11/28/15   Jacalyn LefevreJulie Haviland, MD  pregabalin (LYRICA) 100 MG capsule Take 1 capsule (100 mg total) by mouth 2 (two) times daily. Patient not taking: Reported on  04/22/2016 09/13/15   Pete Glatter, MD    Family History Family History  Problem Relation Age of Onset  . Heart attack    . Prostate cancer    . Hypertension    . Hypertension Mother   . Cancer Father     Social History Social History  Substance Use Topics  . Smoking status: Current Some Day Smoker    Packs/day: 0.50    Years: 20.00    Types:  Cigarettes  . Smokeless tobacco: Never Used  . Alcohol use No     Allergies   Codeine and Gabapentin   Review of Systems Review of Systems  Constitutional: Positive for chills. Negative for fever.  HENT: Negative.   Respiratory: Negative for shortness of breath.   Cardiovascular: Negative for chest pain.  Gastrointestinal: Positive for diarrhea and vomiting.  Genitourinary: Negative.   Musculoskeletal: Negative.   Skin: Negative.   Allergic/Immunologic: Negative for immunocompromised state.  Neurological: Negative.   Hematological: Does not bruise/bleed easily.  Psychiatric/Behavioral: Positive for confusion.     Physical Exam Updated Vital Signs BP (!) 157/101   Pulse (!) 121   Temp 98.4 F (36.9 C) (Oral)   Resp (!) 29   SpO2 99%   Physical Exam  Constitutional: He appears cachectic.  Non-toxic appearance. He has a sickly appearance.  Initially responsive to aggressive painful stimuli, later became alert and oriented  HENT:  Head: Normocephalic and atraumatic.  Mouth/Throat: Mucous membranes are dry.  Eyes:  Blinks to threat bilaterally. Pupils 3 mm to 2 mm and reactive bilaterally. No scleral icterus.  Cardiovascular: Regular rhythm, normal heart sounds and intact distal pulses.  Tachycardia present.   Pulmonary/Chest: Effort normal. No accessory muscle usage. No respiratory distress. He has no decreased breath sounds. He has no wheezes. He has no rhonchi. He has no rales.  Abdominal: Soft. There is no tenderness. There is no rigidity, no rebound, no guarding, no tenderness at McBurney's point and negative Murphy's sign.  Neurological: GCS eye subscore is 1. GCS verbal subscore is 1. GCS motor subscore is 5.  GCS initially 7 improved to 15.      ED Treatments / Results  Labs (all labs ordered are listed, but only abnormal results are displayed) Labs Reviewed  COMPREHENSIVE METABOLIC PANEL - Abnormal; Notable for the following:       Result Value   Sodium  133 (*)    Potassium 5.9 (*)    Chloride 99 (*)    CO2 12 (*)    Glucose, Bld 320 (*)    Creatinine, Ser 1.28 (*)    Total Bilirubin 1.4 (*)    Anion gap 22 (*)    All other components within normal limits  ACETAMINOPHEN LEVEL - Abnormal; Notable for the following:    Acetaminophen (Tylenol), Serum <10 (*)    All other components within normal limits  URINALYSIS, ROUTINE W REFLEX MICROSCOPIC (NOT AT [facility]) - Abnormal; Notable for the following:    Glucose, UA >1000 (*)    Ketones, ur >80 (*)    All other components within normal limits  RAPID URINE DRUG SCREEN, HOSP PERFORMED - Abnormal; Notable for the following:    Opiates POSITIVE (*)    Cocaine POSITIVE (*)    All other components within normal limits  URINE MICROSCOPIC-ADD ON - Abnormal; Notable for the following:    Squamous Epithelial / LPF 0-5 (*)    Bacteria, UA RARE (*)    Casts HYALINE CASTS (*)  All other components within normal limits  I-STAT VENOUS BLOOD GAS, ED - Abnormal; Notable for the following:    pH, Ven 7.135 (*)    pCO2, Ven 34.7 (*)    pO2, Ven 27.0 (*)    Bicarbonate 11.7 (*)    Acid-base deficit 16.0 (*)    All other components within normal limits  CBG MONITORING, ED - Abnormal; Notable for the following:    Glucose-Capillary 325 (*)    All other components within normal limits  CBG MONITORING, ED - Abnormal; Notable for the following:    Glucose-Capillary 242 (*)    All other components within normal limits  CBC WITH DIFFERENTIAL/PLATELET  ETHANOL  SALICYLATE LEVEL  BLOOD GAS, VENOUS  I-STAT CG4 LACTIC ACID, ED  I-STAT CG4 LACTIC ACID, ED  CBG MONITORING, ED    EKG  EKG Interpretation  Date/Time:  Monday April 22 2016 20:48:29 EDT Ventricular Rate:  107 PR Interval:    QRS Duration: 90 QT Interval:  316 QTC Calculation: 422 R Axis:   76 Text Interpretation:  Sinus tachycardia Right atrial enlargement No significant change since last tracing Confirmed by [facility] MD, PEDRO ([id number])  on 04/22/2016 9:59:28 PM       Radiology Ct Head Wo Contrast  Result Date: 04/22/2016 CLINICAL DATA:  50 y/o M; altered mental status with fever and sepsis. EXAM: CT HEAD WITHOUT CONTRAST TECHNIQUE: Contiguous axial images were obtained from the base of the skull through the vertex without intravenous contrast. COMPARISON:  04/16/2015 CT head. FINDINGS: Brain: No evidence of acute infarction, hemorrhage, hydrocephalus, extra-axial collection or mass lesion/mass effect. Vascular: No hyperdense vessel or unexpected calcification. Skull: Normal. Negative for fracture or focal lesion. Sinuses/Orbits: Visualized paranasal sinuses and mastoid air cells are normally aerated. Orbits are unremarkable. Left external auditory canal opacification is probably cerumen. Other: None. IMPRESSION: No acute intracranial abnormality is identified. Unremarkable CT of the head for age. Electronically Signed   By: Mitzi HansenLance  Furusawa-Stratton M.D.   On: 04/22/2016 23:20    Procedures Procedures (including critical care time)  Medications Ordered in ED Medications  insulin regular (NOVOLIN R,HUMULIN R) 250 Units in sodium chloride 0.9 % 250 mL (1 Units/mL) infusion (1.8 Units/hr Intravenous Rate/Dose Change 04/23/16 0024)  0.9 %  sodium chloride infusion (125 mL/hr Intravenous New Bag/Given 04/22/16 2316)  dextrose 5 %-0.45 % sodium chloride infusion (not administered)  naloxone [facility](NARCAN) injection 0.4 mg (0.4 mg Intravenous Given 04/22/16 2114)  sodium chloride 0.9 % bolus 1,000 mL (0 mLs Intravenous Stopped 04/22/16 2250)  ondansetron (ZOFRAN) injection 4 mg (4 mg Intravenous Given 04/22/16 2126)     Initial Impression / Assessment and Plan / ED Course  I have reviewed the triage vital signs and the nursing notes.  Pertinent labs & imaging results that were available during my care of the patient were reviewed by me and considered in my medical decision making (see chart for details).  Clinical Course     Patient presents after being called out for unresponsiveness, history of diabetes. On arrival he is GCS of 7-8 though per EMS report would awaken to retch and was protecting his airway. Intermittently would respond to voice. No signs of trauma. He was given 0.4 mg of Narcan and soon after this did wake up and start vomiting, then was GCS of 15. Glucose by EMS around 300. EKG with sinus tachycardia, no signs of ischemia or arrhythmia. Labs reveal concern for DKA with pH of 7.1, anion gap of 22, +ketones in the  urine. CT head unremarkable. He was given 1L of NS and started on an insulin drip. He will be admitted to the stepdown unit for further management.  Final Clinical Impressions(s) / ED Diagnoses   Final diagnoses:  Diabetic ketoacidosis with coma associated with type 2 diabetes mellitus (HCC)  Somnolence  AKI (acute kidney injury) (HCC)    New Prescriptions New Prescriptions   No medications on file     Preston Fleeting, MD 04/23/16 0127

## 2016-04-22 NOTE — ED Notes (Signed)
Abnormal VBG results reported to Dr. Eudelia Bunchardama

## 2016-04-22 NOTE — ED Notes (Signed)
Pt woke up suddenly; pt wanted to know where he was and how he got here; pt a&ox 4 after a few minutes; Pt had large emesis and given med; pt was able to urinate on his own using urinal.

## 2016-04-22 NOTE — ED Notes (Signed)
Resident at bedside.  

## 2016-04-22 NOTE — ED Notes (Signed)
Patient c/o dry mouth, abd burning and not feeling good

## 2016-04-23 ENCOUNTER — Inpatient Hospital Stay (HOSPITAL_COMMUNITY): Payer: Self-pay

## 2016-04-23 ENCOUNTER — Encounter (HOSPITAL_COMMUNITY): Payer: Self-pay | Admitting: Internal Medicine

## 2016-04-23 DIAGNOSIS — E1110 Type 2 diabetes mellitus with ketoacidosis without coma: Secondary | ICD-10-CM | POA: Diagnosis present

## 2016-04-23 DIAGNOSIS — R4189 Other symptoms and signs involving cognitive functions and awareness: Secondary | ICD-10-CM | POA: Diagnosis present

## 2016-04-23 DIAGNOSIS — N179 Acute kidney failure, unspecified: Secondary | ICD-10-CM | POA: Diagnosis present

## 2016-04-23 DIAGNOSIS — R55 Syncope and collapse: Secondary | ICD-10-CM

## 2016-04-23 LAB — CBC
HCT: 42.7 % (ref 39.0–52.0)
Hemoglobin: 14.1 g/dL (ref 13.0–17.0)
MCH: 29 pg (ref 26.0–34.0)
MCHC: 33 g/dL (ref 30.0–36.0)
MCV: 87.9 fL (ref 78.0–100.0)
PLATELETS: 241 10*3/uL (ref 150–400)
RBC: 4.86 MIL/uL (ref 4.22–5.81)
RDW: 12.8 % (ref 11.5–15.5)
WBC: 13.1 10*3/uL — ABNORMAL HIGH (ref 4.0–10.5)

## 2016-04-23 LAB — BASIC METABOLIC PANEL
Anion gap: 14 (ref 5–15)
Anion gap: 7 (ref 5–15)
BUN: 17 mg/dL (ref 6–20)
BUN: 14 mg/dL (ref 6–20)
CALCIUM: 8.6 mg/dL — AB (ref 8.9–10.3)
CALCIUM: 8.3 mg/dL — AB (ref 8.9–10.3)
CHLORIDE: 110 mmol/L (ref 101–111)
CHLORIDE: 110 mmol/L (ref 101–111)
CO2: 13 mmol/L — AB (ref 22–32)
CO2: 18 mmol/L — AB (ref 22–32)
CREATININE: 1.2 mg/dL (ref 0.61–1.24)
CREATININE: 0.94 mg/dL (ref 0.61–1.24)
GFR calc non Af Amer: >60 mL/min (ref >60)
GFR calc non Af Amer: >60 mL/min (ref >60)
Glucose, Bld: 192 mg/dL — ABNORMAL HIGH (ref 65–99)
Glucose, Bld: 138 mg/dL — ABNORMAL HIGH (ref 65–99)
Potassium: 4.6 mmol/L (ref 3.5–5.1)
Potassium: 4.6 mmol/L (ref 3.5–5.1)
SODIUM: 137 mmol/L (ref 135–145)
Sodium: 135 mmol/L (ref 135–145)

## 2016-04-23 LAB — CBG MONITORING, ED
GLUCOSE-CAPILLARY: 207 mg/dL — AB (ref 65–99)
GLUCOSE-CAPILLARY: 163 mg/dL — AB (ref 65–99)
GLUCOSE-CAPILLARY: 140 mg/dL — AB (ref 65–99)
GLUCOSE-CAPILLARY: 148 mg/dL — AB (ref 65–99)
GLUCOSE-CAPILLARY: 134 mg/dL — AB (ref 65–99)
GLUCOSE-CAPILLARY: 173 mg/dL — AB (ref 65–99)
Glucose-Capillary: 102 mg/dL — ABNORMAL HIGH (ref 65–99)
Glucose-Capillary: 134 mg/dL — ABNORMAL HIGH (ref 65–99)
Glucose-Capillary: 140 mg/dL — ABNORMAL HIGH (ref 65–99)
Glucose-Capillary: 148 mg/dL — ABNORMAL HIGH (ref 65–99)
Glucose-Capillary: 205 mg/dL — ABNORMAL HIGH (ref 65–99)
Glucose-Capillary: 242 mg/dL — ABNORMAL HIGH (ref 65–99)

## 2016-04-23 LAB — TROPONIN I
TROPONIN I: 0.03 ng/mL — AB (ref <0.03)
Troponin I: 0.04 ng/mL (ref <0.03)
Troponin I: 0.04 ng/mL (ref <0.03)

## 2016-04-23 LAB — ECHOCARDIOGRAM COMPLETE

## 2016-04-23 LAB — GLUCOSE, CAPILLARY
Glucose-Capillary: 168 mg/dL — ABNORMAL HIGH (ref 65–99)
Glucose-Capillary: 132 mg/dL — ABNORMAL HIGH (ref 65–99)

## 2016-04-23 LAB — MRSA PCR SCREENING: MRSA BY PCR: NEGATIVE

## 2016-04-23 MED ORDER — DEXTROSE-NACL 5-0.45 % IV SOLN
INTRAVENOUS | Status: DC
  Administered 2016-04-23: 125 mL/h via INTRAVENOUS

## 2016-04-23 MED ORDER — POTASSIUM CHLORIDE 10 MEQ/100ML IV SOLN
10.0000 meq | INTRAVENOUS | Status: AC
  Administered 2016-04-23 (×4): 10 meq via INTRAVENOUS
  Filled 2016-04-23 (×4): qty 100

## 2016-04-23 MED ORDER — INSULIN ASPART 100 UNIT/ML ~~LOC~~ SOLN: 0.0000 [IU] | Freq: Every day | SUBCUTANEOUS | Status: DC

## 2016-04-23 MED ORDER — METOCLOPRAMIDE HCL 5 MG/ML IJ SOLN
10.0000 mg | Freq: Once | INTRAMUSCULAR | Status: AC
  Administered 2016-04-23: 10 mg via INTRAVENOUS
  Filled 2016-04-23: qty 2

## 2016-04-23 MED ORDER — DIPHENHYDRAMINE HCL 50 MG/ML IJ SOLN
25.0000 mg | Freq: Once | INTRAMUSCULAR | Status: AC
  Administered 2016-04-23: 25 mg via INTRAVENOUS
  Filled 2016-04-23: qty 1

## 2016-04-23 MED ORDER — INSULIN ASPART PROT & ASPART (70-30 MIX) 100 UNIT/ML ~~LOC~~ SUSP
45.0000 [IU] | Freq: Two times a day (BID) | SUBCUTANEOUS | Status: DC
  Administered 2016-04-23 (×2): 45 [IU] via SUBCUTANEOUS
  Filled 2016-04-23: qty 10

## 2016-04-23 MED ORDER — SODIUM CHLORIDE 0.9 % IV SOLN
INTRAVENOUS | Status: DC
  Administered 2016-04-23 – 2016-04-24 (×3): via INTRAVENOUS

## 2016-04-23 MED ORDER — FAMOTIDINE 20 MG PO TABS
20.0000 mg | ORAL_TABLET | Freq: Two times a day (BID) | ORAL | Status: DC
  Administered 2016-04-23 – 2016-04-24 (×3): 20 mg via ORAL
  Filled 2016-04-23 (×3): qty 1

## 2016-04-23 MED ORDER — FAMOTIDINE IN NACL 20-0.9 MG/50ML-% IV SOLN
20.0000 mg | Freq: Once | INTRAVENOUS | Status: AC
  Administered 2016-04-23: 20 mg via INTRAVENOUS
  Filled 2016-04-23: qty 50

## 2016-04-23 MED ORDER — ASPIRIN EC 81 MG PO TBEC
81.0000 mg | DELAYED_RELEASE_TABLET | Freq: Every day | ORAL | Status: DC
  Administered 2016-04-23 – 2016-04-24 (×2): 81 mg via ORAL
  Filled 2016-04-23 (×2): qty 1

## 2016-04-23 MED ORDER — ACETAMINOPHEN 325 MG PO TABS
650.0000 mg | ORAL_TABLET | Freq: Four times a day (QID) | ORAL | Status: DC | PRN
  Administered 2016-04-23 (×2): 650 mg via ORAL
  Filled 2016-04-23 (×2): qty 2

## 2016-04-23 MED ORDER — ENOXAPARIN SODIUM 40 MG/0.4ML ~~LOC~~ SOLN
40.0000 mg | Freq: Every day | SUBCUTANEOUS | Status: DC
  Administered 2016-04-23 – 2016-04-24 (×2): 40 mg via SUBCUTANEOUS
  Filled 2016-04-23 (×3): qty 0.4

## 2016-04-23 MED ORDER — KETOROLAC TROMETHAMINE 30 MG/ML IJ SOLN
30.0000 mg | Freq: Once | INTRAMUSCULAR | Status: AC
  Administered 2016-04-23: 30 mg via INTRAVENOUS
  Filled 2016-04-23: qty 1

## 2016-04-23 MED ORDER — SODIUM CHLORIDE 0.9 % IV SOLN
INTRAVENOUS | Status: DC
  Administered 2016-04-23: 3.1 [IU]/h via INTRAVENOUS
  Administered 2016-04-23: 2.6 [IU]/h via INTRAVENOUS

## 2016-04-23 MED ORDER — INSULIN ASPART 100 UNIT/ML ~~LOC~~ SOLN
0.0000 [IU] | Freq: Three times a day (TID) | SUBCUTANEOUS | Status: DC
  Administered 2016-04-23: 2 [IU] via SUBCUTANEOUS
  Administered 2016-04-24: 5 [IU] via SUBCUTANEOUS
  Administered 2016-04-24: 3 [IU] via SUBCUTANEOUS

## 2016-04-23 MED ORDER — SODIUM CHLORIDE 0.9 % IV SOLN
Freq: Once | INTRAVENOUS | Status: DC
  Administered 2016-04-23: 03:00:00 via INTRAVENOUS

## 2016-04-23 MED ORDER — SODIUM CHLORIDE 0.9 % IV SOLN
INTRAVENOUS | Status: DC
  Administered 2016-04-23: 125 mL/h via INTRAVENOUS

## 2016-04-23 MED ORDER — FAMOTIDINE IN NACL 20-0.9 MG/50ML-% IV SOLN: 20.0000 mg | Freq: Two times a day (BID) | INTRAVENOUS | Status: DC

## 2016-04-23 MED ORDER — SODIUM CHLORIDE 0.9 % IV BOLUS (SEPSIS)
1000.0000 mL | Freq: Once | INTRAVENOUS | Status: AC
  Administered 2016-04-23: 1000 mL via INTRAVENOUS

## 2016-04-23 NOTE — ED Notes (Signed)
Attempted to Call Diabetes Services, but unable to get. Spoke with Alvis Lemmingsawn, RN on [id number] for clarification of DKA Protocol with help from DKA Sidebar

## 2016-04-23 NOTE — ED Notes (Signed)
Paged Admitting about patient's care. Pt to have a long-acting Insulin and then continue insulin for two hours before D/C. Waiting for orders.

## 2016-04-23 NOTE — ED Notes (Signed)
Echo at the bedside °

## 2016-04-23 NOTE — Progress Notes (Signed)
Patients troponin level 0.04. Dr. Allena KatzPatel aware.

## 2016-04-23 NOTE — H&P (Signed)
History and Physical    Rickey HooseWilliam S Tiu ONG:[id number] DOB: 01/14/1966 DOA: 04/22/2016  PCP: No PCP Per Patient  Patient coming from: Home.  Chief Complaint: Unresponsive episode.  HPI: Rickey Smith is a 50 y.o. male with diabetes mellitus type 2, polysubstance abuse was brought to the ER after patient was found to be unresponsive. Patient states he has been having nausea vomiting for last 3-4 days and had gone to visit his mother's house yesterday. Patient was found to be unresponsive and EMS was called and brought to the ER. Patient responded to Narcan and presently is well-oriented. No focal deficits. Patient's lab work show patient is in DKA and was started on IV fluids and IV insulin infusion. Patient states over the last 3-4 days has been having persistent nausea vomiting. Denies any diarrhea. Also has been having epigastric pain. Patient stays because of his nausea vomiting he had missed his insulin dosage.  ED Course: CT head was unremarkable. Urine tox screen is positive for cocaine. Labs show patient is in DKA and was started on IV insulin and IV fluids.  Review of Systems: As per HPI, rest all negative.   Past Medical History:  Diagnosis Date  . Diabetes mellitus without complication (HCC)   . Migraine     Past Surgical History:  Procedure Laterality Date  . TRANSURETHRAL RESECTION OF PROSTATE N/A 08/16/2015   Procedure: TRANSURETHRAL RESECTION DRAINAGE OF PROSTATE ABCESS;  Surgeon: Barron Alvineavid Grapey, MD;  Location: [facility];  Service: Urology;  Laterality: N/A;     reports that he has been smoking Cigarettes.  He has a 10.00 pack-year smoking history. He has never used smokeless tobacco. He reports that he uses drugs, including Cocaine and Other-see comments. He reports that he does not drink alcohol.  Allergies  Allergen Reactions  . Codeine Itching  . Gabapentin Diarrhea and Nausea And Vomiting    Family History  Problem Relation Age of Onset  . Heart attack    .  Prostate cancer    . Hypertension    . Hypertension Mother   . Cancer Father     Prior to Admission medications   Medication Sig Start Date End Date Taking? Authorizing Provider  insulin aspart protamine- aspart (NOVOLOG MIX 70/30) (70-30) 100 UNIT/ML injection Inject 0.45 mLs (45 Units total) into the skin 2 (two) times daily with a meal. 08/17/15  Yes Joseph ArtJessica U Vann, DO  amitriptyline (ELAVIL) 75 MG tablet Take 1 tablet (75 mg total) by mouth at bedtime. Patient not taking: Reported on 04/22/2016 08/10/15   Jaclyn ShaggyEnobong Amao, MD  atorvastatin (LIPITOR) 40 MG tablet Take 1 tablet (40 mg total) by mouth daily at 6 PM. Patient not taking: Reported on 04/22/2016 10/26/14   Henderson CloudEstela Y Hernandez Acosta, MD  famotidine (PEPCID) 20 MG tablet Take 1 tablet (20 mg total) by mouth 2 (two) times daily. Patient not taking: Reported on 04/22/2016 03/14/16 06/12/16  Alm BustardMatthew O'Sullivan, MD  lisinopril (PRINIVIL,ZESTRIL) 20 MG tablet Take 1 tablet (20 mg total) by mouth daily. Patient not taking: Reported on 04/22/2016 03/15/16 07/13/16  Alm BustardMatthew O'Sullivan, MD  ondansetron (ZOFRAN) 4 MG tablet Take 1 tablet (4 mg total) by mouth every 6 (six) hours. Patient not taking: Reported on 04/22/2016 11/28/15   Jacalyn LefevreJulie Haviland, MD  pregabalin (LYRICA) 100 MG capsule Take 1 capsule (100 mg total) by mouth 2 (two) times daily. Patient not taking: Reported on 04/22/2016 09/13/15   Pete Glatterawn T Langeland, MD    Physical Exam: Vitals:  04/23/16 0030 04/23/16 0045 04/23/16 0100 04/23/16 0115  BP: 155/84 146/96 146/96 119/86  Pulse: 116 (!) 121 116 (!) 122  Resp: 23 21 21 18   Temp:      TempSrc:      SpO2: 95% 96% 94% 95%      Constitutional: Moderately built and nourished. Vitals:   04/23/16 0030 04/23/16 0045 04/23/16 0100 04/23/16 0115  BP: 155/84 146/96 146/96 119/86  Pulse: 116 (!) 121 116 (!) 122  Resp: 23 21 21 18   Temp:      TempSrc:      SpO2: 95% 96% 94% 95%   Eyes: Anicteric. No pallor. ENMT: No discharge from  the ears eyes nose or mouth. Neck: No neck rigidity no mass felt. Respiratory: No rhonchi or crepitations. Cardiovascular: S1 and S2 heard. No murmurs appreciated. Abdomen: Soft nontender bowel sounds present. Musculoskeletal: No edema. No joint effusion. Skin: No rash.Skin appears warm. Neurologic: Alert awake oriented to time place and person. Moves all extremities. Psychiatric: Appears normal. Normal affect.   Labs on Admission: I have personally reviewed following labs and imaging studies  CBC:  Recent Labs Lab 04/22/16 2115  WBC 9.0  NEUTROABS 7.3  HGB 15.9  HCT 48.6  MCV 89.3  PLT 267   Basic Metabolic Panel:  Recent Labs Lab 04/22/16 2115  NA 133*  K 5.9*  CL 99*  CO2 12*  GLUCOSE 320*  BUN 18  CREATININE 1.28*  CALCIUM 9.4   GFR: CrCl cannot be calculated (Unknown ideal weight.). Liver Function Tests:  Recent Labs Lab 04/22/16 2115  AST 17  ALT 30  ALKPHOS 86  BILITOT 1.4*  PROT 8.1  ALBUMIN 4.8   No results for input(s): LIPASE, AMYLASE in the last 168 hours. No results for input(s): AMMONIA in the last 168 hours. Coagulation Profile: No results for input(s): INR, PROTIME in the last 168 hours. Cardiac Enzymes: No results for input(s): CKTOTAL, CKMB, CKMBINDEX, TROPONINI in the last 168 hours. BNP (last 3 results) No results for input(s): PROBNP in the last 8760 hours. HbA1C: No results for input(s): HGBA1C in the last 72 hours. CBG:  Recent Labs Lab 04/22/16 2312 04/23/16 0021 04/23/16 0126  GLUCAP 325* 242* 205*   Lipid Profile: No results for input(s): CHOL, HDL, LDLCALC, TRIG, CHOLHDL, LDLDIRECT in the last 72 hours. Thyroid Function Tests: No results for input(s): TSH, T4TOTAL, FREET4, T3FREE, THYROIDAB in the last 72 hours. Anemia Panel: No results for input(s): VITAMINB12, FOLATE, FERRITIN, TIBC, IRON, RETICCTPCT in the last 72 hours. Urine analysis:    Component Value Date/Time   COLORURINE YELLOW 04/22/2016 2130    APPEARANCEUR CLEAR 04/22/2016 2130   LABSPEC 1.028 04/22/2016 2130   PHURINE 5.5 04/22/2016 2130   GLUCOSEU >1000 (A) 04/22/2016 2130   HGBUR NEGATIVE 04/22/2016 2130   BILIRUBINUR NEGATIVE 04/22/2016 2130   BILIRUBINUR neg 08/10/2015 1041   KETONESUR >80 (A) 04/22/2016 2130   PROTEINUR NEGATIVE 04/22/2016 2130   UROBILINOGEN 2.0 08/10/2015 1041   UROBILINOGEN 0.2 10/23/2014 0817   NITRITE NEGATIVE 04/22/2016 2130   LEUKOCYTESUR NEGATIVE 04/22/2016 2130   Sepsis Labs: @LABRCNTIP (procalcitonin:4,lacticidven:4) )No results found for this or any previous visit (from the past 240 hour(s)).   Radiological Exams on Admission: Ct Head Wo Contrast  Result Date: 04/22/2016 CLINICAL DATA:  50 y/o M; altered mental status with fever and sepsis. EXAM: CT HEAD WITHOUT CONTRAST TECHNIQUE: Contiguous axial images were obtained from the base of the skull through the vertex without intravenous contrast. COMPARISON:  04/16/2015  CT head. FINDINGS: Brain: No evidence of acute infarction, hemorrhage, hydrocephalus, extra-axial collection or mass lesion/mass effect. Vascular: No hyperdense vessel or unexpected calcification. Skull: Normal. Negative for fracture or focal lesion. Sinuses/Orbits: Visualized paranasal sinuses and mastoid air cells are normally aerated. Orbits are unremarkable. Left external auditory canal opacification is probably cerumen. Other: None. IMPRESSION: No acute intracranial abnormality is identified. Unremarkable CT of the head for age. Electronically Signed   By: Mitzi HansenLance  Furusawa-Stratton M.D.   On: 04/22/2016 23:20    EKG: Independently reviewed. Sinus tachycardia.  Assessment/Plan Principal Problem:   DKA (diabetic ketoacidoses) (HCC) Active Problems:   ARF (acute renal failure) (HCC)   Unresponsive episode   DKA, type 2 (HCC)    1. Diabetic ketoacidosis - likely secondary to noncompliance. Patient has been placed on IV fluid bolus and infusion and started on insulin  infusion. Closely follow metabolic panel and change to subcutaneous insulin once patient's anionic gap is corrected. 2. Epigastric pain with nausea vomiting - could be from gastritis and possible gastroparesis. Acute abdominal series is pending. Patient has been placed on Pepcid. 3. Unresponsive episode - could be related to patient's drug abuse. Patient responded to Narcan. Closely follow telemetry. Check 2-D echo. Check cardiac markers. 4. Polysubstance abuse - advised to quit using cocaine.   DVT prophylaxis: Lovenox. Code Status: Full code.  Family Communication: Discussed with patient.  Disposition Plan: Home.  Consults called: None.  Admission status: Inpatient. Likely stay 2 days.    Eduard ClosKAKRAKANDY,Taleia Sadowski N. MD [facility] Pager [phone].  If 7PM-7AM, please contact night-coverage [email or web address] Password [email or web address]  04/23/2016, 2:26 AM

## 2016-04-23 NOTE — ED Notes (Signed)
Pt's CBG result was 102. Informed Hannah - RN.

## 2016-04-23 NOTE — Progress Notes (Signed)
  Echocardiogram 2D Echocardiogram has been performed.  Janalyn HarderWest, Mayzee Reichenbach R 04/23/2016, 11:57 AM

## 2016-04-23 NOTE — ED Notes (Signed)
Paged MD Verified Insulin Novolog to be Patient's basil dose.

## 2016-04-23 NOTE — ED Notes (Signed)
Dr Toniann FailKakrakandy stated to wait for next BMP to determine what to do with the insulin drip and lantus and sliding scale

## 2016-04-23 NOTE — ED Notes (Signed)
Paged Admitting x2 about concerns for taking patient off of the Insulin Drip.

## 2016-04-23 NOTE — Progress Notes (Signed)
Inpatient Diabetes Program Recommendations  AACE/ADA: New Consensus Statement on Inpatient Glycemic Control (2015)  Target Ranges:  Prepandial:   less than 140 mg/dL      Peak postprandial:   less than 180 mg/dL (1-2 hours)      Critically ill patients:  140 - 180 mg/dL   Results for Rickey Smith, Rickey Smith (MRN [id number]) as of 04/23/2016 14:46  Ref. Range 04/22/2016 21:15  Sodium Latest Ref Range: 135 - 145 mmol/L 133 (L)  Potassium Latest Ref Range: 3.5 - 5.1 mmol/L 5.9 (H)  Chloride Latest Ref Range: 101 - 111 mmol/L 99 (L)  CO2 Latest Ref Range: 22 - 32 mmol/L 12 (L)  BUN Latest Ref Range: 6 - 20 mg/dL 18  Creatinine Latest Ref Range: 0.61 - 1.24 mg/dL 1.28 (H)  Calcium Latest Ref Range: 8.9 - 10.3 mg/dL 9.4  EGFR (Non-African Amer.) Latest Ref Range: >60 mL/min >60  EGFR (African American) Latest Ref Range: >60 mL/min >60  Glucose Latest Ref Range: 65 - 99 mg/dL 320 (H)  Anion gap Latest Ref Range: 5 - 15  22 (H)    Admit with: DKA  History: DM, Polysubstance Abuse  Home DM Meds: 70/30 Insulin- 45 units BIDWC  Current Insulin Orders: 70/30 Insulin- 45 units BIDWC      Novolog Moderate Correction Scale/ SSI (0-15 units) TID AC + HS       -Spoke with pt today about his admission for DKA.  Discussed with patient diagnosis of DKA (pathophysiology), treatment of DKA, lab results, and transition plan to SQ insulin regimen.  -Note patient screened positive for cocaine this admission.  Use of illegal substances will likely make it hard for pt to take care of his diabetes properly at home.  Patient stated he goes to the [facility] here at [facility].  Uses the pharmacy at the clinic as well.  -Patient stated to me he is checking his CBGs 4-5 times per day but somehow I doubt this information is accurate.  Nonetheless, I encouraged pt to continue to check his CBGs frequently at home and I strongly encouraged him to take his insulin as prescribed.  Also encouraged pt  to follow up at the [facility] clinic after d/c for further DM management.     --Will follow patient during hospitalization--  Wyn Quaker RN, MSN, CDE Diabetes Coordinator Inpatient Glycemic Control Team Team Pager: [phone] (8a-5p)

## 2016-04-23 NOTE — ED Notes (Signed)
[place]ed [address]. Updated about patient's care. Verbalized understanding

## 2016-04-23 NOTE — Progress Notes (Signed)
[facility] PLAN OF CARE NOTE Patient: Rickey HooseWilliam S Smith ZOX:[id number]   PCP: No PCP Per Patient DOB: 09/22/1965   DOA: 04/22/2016   DOS: 04/23/2016    Patient was admitted by my colleague Dr. Toniann Smith  earlier on 04/23/2016. I have reviewed the H&P as well as assessment and plan and agree with the same. Important changes in the plan are listed below.  Plan of care: Principal Problem:   DKA (diabetic ketoacidoses) (HCC) Active Problems:   ARF (acute renal failure) (HCC)   Unresponsive episode   DKA, type 2 (HCC) DKA currently resolved. Transition to subcutaneous insulin and advancing diet. Transferring to telemetry unit.  Echocardiogram unremarkable, troponin remains fluctuating no evidence of ACS.  Remains orthostatically hypotensive. Continue IV hydration.  Author: Lynden OxfordPranav Aisley Whan, MD [facility] Pager: [phone] 04/23/2016 5:54 PM   If 7PM-7AM, please contact night-coverage at [email or web address], password [email or web address]

## 2016-04-24 DIAGNOSIS — E0810 Diabetes mellitus due to underlying condition with ketoacidosis without coma: Secondary | ICD-10-CM

## 2016-04-24 LAB — GLUCOSE, CAPILLARY
GLUCOSE-CAPILLARY: 110 mg/dL — AB (ref 65–99)
GLUCOSE-CAPILLARY: 222 mg/dL — AB (ref 65–99)
GLUCOSE-CAPILLARY: 180 mg/dL — AB (ref 65–99)
Glucose-Capillary: 162 mg/dL — ABNORMAL HIGH (ref 65–99)
Glucose-Capillary: 43 mg/dL — CL (ref 65–99)
Glucose-Capillary: 66 mg/dL (ref 65–99)
Glucose-Capillary: 74 mg/dL (ref 65–99)

## 2016-04-24 LAB — BASIC METABOLIC PANEL
Anion gap: 7 (ref 5–15)
BUN: 5 mg/dL — ABNORMAL LOW (ref 6–20)
CALCIUM: 8 mg/dL — AB (ref 8.9–10.3)
CO2: 21 mmol/L — AB (ref 22–32)
CREATININE: 0.66 mg/dL (ref 0.61–1.24)
Chloride: 110 mmol/L (ref 101–111)
GFR calc non Af Amer: >60 mL/min (ref >60)
Glucose, Bld: 176 mg/dL — ABNORMAL HIGH (ref 65–99)
Potassium: 3 mmol/L — ABNORMAL LOW (ref 3.5–5.1)
SODIUM: 138 mmol/L (ref 135–145)

## 2016-04-24 MED ORDER — INSULIN ASPART PROT & ASPART (70-30 MIX) 100 UNIT/ML ~~LOC~~ SUSP
40.0000 [IU] | Freq: Two times a day (BID) | SUBCUTANEOUS | Status: DC
  Administered 2016-04-24: 40 [IU] via SUBCUTANEOUS

## 2016-04-24 MED ORDER — POTASSIUM CHLORIDE CRYS ER 20 MEQ PO TBCR
40.0000 meq | EXTENDED_RELEASE_TABLET | Freq: Once | ORAL | Status: AC
  Administered 2016-04-24: 40 meq via ORAL
  Filled 2016-04-24: qty 2

## 2016-04-24 MED ORDER — DEXTROSE 50 % IV SOLN
INTRAVENOUS | Status: AC
  Administered 2016-04-24: 50 mL
  Filled 2016-04-24: qty 50

## 2016-04-24 MED ORDER — INSULIN ASPART PROT & ASPART (70-30 MIX) 100 UNIT/ML ~~LOC~~ SUSP: 40.0000 [IU] | Freq: Two times a day (BID) | SUBCUTANEOUS | 11 refills | Status: DC

## 2016-04-24 NOTE — Care Management Note (Signed)
Case Management Note  Patient Details  Name: Rickey Smith MRN: [id number] Date of Birth: 11-30-1965  Subjective/Objective:     CM following for progression and d/c planning.                Action/Plan: 04/24/2016 Met with pt who again states that he is able to get his insulin at the [facility], his PCP is at the [facility]. We are unable to schedule a followup appointment until Monday , April 29, 2016.   Expected Discharge Date:    04/24/2016              Expected Discharge Plan:  Home/Self Care  In-House Referral:  NA  Discharge planning Services  CM Consult  Post Acute Care Choice:  NA Choice offered to:  NA  DME Arranged:   NA DME Agency:   NA  HH Arranged:   NA HH Agency:   NA  Status of Service:  Completed, signed off  If discussed at [place] of Stay Meetings, dates discussed:    Additional Comments:  Adron Bene, RN 04/24/2016, 2:35 PM

## 2016-04-24 NOTE — Progress Notes (Signed)
Hypoglycemic Event  CBG: 43  Treatment: D50 IV 50 mL  Symptoms: Sweaty   Follow-up CBG: Time:0515 CBG Result:162  Possible Reasons for Event: Inadequate meal intake  Comments/MD notified: Schorr    Rickey Smith I

## 2016-04-24 NOTE — Progress Notes (Signed)
Hypoglycemic Event  CBG: 66  Treatment: 15 GM carbohydrate snack  Symptoms: Sweaty  Follow-up CBG: Time:2159 CBG Result:110  Possible Reasons for Event: Inadequate meal intake  Comments/MD notified:Schorr    Randie Tallarico I

## 2016-04-24 NOTE — Discharge Summary (Signed)
[facility]  Physician Discharge Summary   Patient ID: Rickey Smith MRN: [id number] DOB/AGE: [birth year] 50 y.o.  Admit date: 04/22/2016 Discharge date: 04/24/2016  PCP: No PCP Per Patient  DISCHARGE DIAGNOSES:  Principal Problem:   DKA (diabetic ketoacidoses) (HCC) Active Problems:   ARF (acute renal failure) (HCC)   Unresponsive episode   DKA, type 2 (HCC)   RECOMMENDATIONS FOR OUTPATIENT FOLLOW UP: 1. Patient instructed to follow-up with his outpatient providers.   DISCHARGE CONDITION: fair  Diet recommendation: Modified carbohydrate  Filed Weights   04/23/16 1400 04/23/16 2059  Weight: 72.6 kg (160 lb) 65.8 kg (145 lb)    INITIAL HISTORY: Rickey Smith is a 50 y.o. male with diabetes mellitus type 2, polysubstance abuse was brought to the ER after patient was found to be unresponsive. Patient states he has been having nausea vomiting for last 3-4 days and had gone to visit his mother's house yesterday. Patient was found to be unresponsive and EMS was called and brought to the ER. Patient responded to Narcan. Patient's lab work show patient is in DKA. Patient was hospitalized for management  Consultations:  None  Procedures: Transthoracic echocardiogram Study Conclusions  - Left ventricle: The cavity size was normal. Wall thickness was   normal. Systolic function was vigorous. The estimated ejection   fraction was in the range of 65% to 70%. Wall motion was normal;   there were no regional wall motion abnormalities. Left   ventricular diastolic function parameters were normal. - Aorta: Aortic root dimension: 40 mm (ED). - Ascending aorta: The ascending aorta was mildly dilated. - Mitral valve: Calcified annulus. - Tricuspid valve: There was trivial regurgitation.  Impressions:  - Compared to the prior study, there has been no significant   interval change.  HOSPITAL COURSE:   Diabetic ketoacidosis in the setting of known type 1  diabetes This was likely secondary to noncompliance. Patient was started on treatment for DKA. His acidosis corrected quickly. Anion gap closed. He was transitioned to subcutaneous insulin. He tells me that he takes his insulin on a regular basis. However, his compliance is questionable. He did have a couple of episodes of low blood sugars in the hospital. Insulin dose has been adjusted. Last HbA1c was 10.1 in September.  Epigastric pain with nausea vomiting Most likely due to diabetic ketoacidosis. Symptoms have resolved. He is tolerating his diet without any difficulties.   Unresponsive episode Could be related to patient's drug abuse. Patient responded to Narcan. He did not have any focal neurological deficits. Able to ambulate without any difficulty.   Orthostatic hypotension  Likely due to hypovolemia. Resolved with IV fluids.   Mildly elevated troponin Likely secondary to acute issues. Echocardiogram without any wall motion abnormalities.  Polysubstance abuse Advised to quit using cocaine.  Overall improved. Patient wishes to go home today. Okay for discharge.  PERTINENT LABS:  The results of significant diagnostics from this hospitalization (including imaging, microbiology, ancillary and laboratory) are listed below for reference.    Microbiology: Recent Results (from the past 240 hour(s))  MRSA PCR Screening     Status: None   Collection Time: 04/23/16  3:02 PM  Result Value Ref Range Status   MRSA by PCR NEGATIVE NEGATIVE Final    Comment:        The GeneXpert MRSA Assay (FDA approved for NASAL specimens only), is one component of a comprehensive MRSA colonization surveillance program. It is not intended to diagnose MRSA infection nor to guide or monitor  treatment for MRSA infections.      Labs: Basic Metabolic Panel:  Recent Labs Lab 04/22/16 2115 04/23/16 0247 04/23/16 0606 04/24/16 0636  NA 133* 137 135 138  K 5.9* 4.6 4.6 3.0*  CL 99* 110 110 110   CO2 12* 13* 18* 21*  GLUCOSE 320* 192* 138* 176*  BUN 18 17 14  5*  CREATININE 1.28* 1.20 0.94 0.66  CALCIUM 9.4 8.6* 8.3* 8.0*   Liver Function Tests:  Recent Labs Lab 04/22/16 2115  AST 17  ALT 30  ALKPHOS 86  BILITOT 1.4*  PROT 8.1  ALBUMIN 4.8   CBC:  Recent Labs Lab 04/22/16 2115 04/23/16 0247  WBC 9.0 13.1*  NEUTROABS 7.3  --   HGB 15.9 14.1  HCT 48.6 42.7  MCV 89.3 87.9  PLT 267 241   Cardiac Enzymes:  Recent Labs Lab 04/23/16 0247 04/23/16 0606 04/23/16 1445  TROPONINI 0.03* 0.04* 0.04*    CBG:  Recent Labs Lab 04/24/16 0033 04/24/16 0436 04/24/16 0515 04/24/16 0752 04/24/16 1129  GLUCAP 74 43* 162* 222* 180*     IMAGING STUDIES Ct Head Wo Contrast  Result Date: 04/22/2016 CLINICAL DATA:  50 y/o M; altered mental status with fever and sepsis. EXAM: CT HEAD WITHOUT CONTRAST TECHNIQUE: Contiguous axial images were obtained from the base of the skull through the vertex without intravenous contrast. COMPARISON:  04/16/2015 CT head. FINDINGS: Brain: No evidence of acute infarction, hemorrhage, hydrocephalus, extra-axial collection or mass lesion/mass effect. Vascular: No hyperdense vessel or unexpected calcification. Skull: Normal. Negative for fracture or focal lesion. Sinuses/Orbits: Visualized paranasal sinuses and mastoid air cells are normally aerated. Orbits are unremarkable. Left external auditory canal opacification is probably cerumen. Other: None. IMPRESSION: No acute intracranial abnormality is identified. Unremarkable CT of the head for age. Electronically Signed   By: Mitzi HansenLance  Furusawa-Stratton M.D.   On: 04/22/2016 23:20   Dg Abd Acute W/chest  Result Date: 04/23/2016 CLINICAL DATA:  Acute onset of mid abdominal pain, nausea and vomiting. Initial encounter. EXAM: DG ABDOMEN ACUTE W/ 1V CHEST COMPARISON:  Chest radiograph performed 03/12/2016 FINDINGS: The lungs are well-aerated. Mild peribronchial thickening is noted. There is no evidence  of focal opacification, pleural effusion or pneumothorax. The cardiomediastinal silhouette is within normal limits. The visualized bowel gas pattern is unremarkable. Scattered stool and air are seen within the colon; there is no evidence of small bowel dilatation to suggest obstruction. No free intra-abdominal air is identified on the provided upright view. The stomach is partially filled with air. No acute osseous abnormalities are seen; the sacroiliac joints are unremarkable in appearance. IMPRESSION: 1. Unremarkable bowel gas pattern; no free intra-abdominal air seen. Moderate amount of stool noted in the colon. 2. Mild peribronchial thickening noted. Lungs otherwise grossly clear. Electronically Signed   By: Roanna RaiderJeffery  Chang M.D.   On: 04/23/2016 03:31    DISCHARGE EXAMINATION: Vitals:   04/23/16 1655 04/23/16 2059 04/24/16 0433 04/24/16 0800  BP: 127/75 124/72 133/76 120/82  Pulse: 84 81 71 62  Resp: 19 18 18 16   Temp: 98.6 F (37 C) 98.7 F (37.1 C) 97.6 F (36.4 C)   TempSrc: Oral Oral    SpO2: 99% 99% 98% 96%  Weight:  65.8 kg (145 lb)    Height:  6\' 1"  (1.854 m)     General appearance: alert, cooperative, appears stated age and no distress Resp: clear to auscultation bilaterally Cardio: regular rate and rhythm, S1, S2 normal, no murmur, click, rub or gallop GI: soft, non-tender;  bowel sounds normal; no masses,  no organomegaly Extremities: extremities normal, atraumatic, no cyanosis or edema   DISPOSITION: Home  Discharge Instructions    Call MD for:  extreme fatigue    Complete by:  As directed    Call MD for:  persistant dizziness or light-headedness    Complete by:  As directed    Call MD for:  persistant nausea and vomiting    Complete by:  As directed    Call MD for:  severe uncontrolled pain    Complete by:  As directed    Call MD for:  temperature >100.4    Complete by:  As directed    Diet Carb Modified    Complete by:  As directed    Discharge instructions     Complete by:  As directed    Please follow-up with the outpatient providers. Please take your medications as prescribed. Monitor blood glucose levels at home.  You were cared for by a hospitalist during your hospital stay. If you have any questions about your discharge medications or the care you received while you were in the hospital after you are discharged, you can call the unit and asked to speak with the hospitalist on call if the hospitalist that took care of you is not available. Once you are discharged, your primary care physician will handle any further medical issues. Please note that NO REFILLS for any discharge medications will be authorized once you are discharged, as it is imperative that you return to your primary care physician (or establish a relationship with a primary care physician if you do not have one) for your aftercare needs so that they can reassess your need for medications and monitor your lab values. If you do not have a primary care physician, you can call [phone] for a physician referral.   Increase activity slowly    Complete by:  As directed       ALLERGIES:  Allergies  Allergen Reactions  . Codeine Itching  . Gabapentin Diarrhea and Nausea And Vomiting     Current Discharge Medication List    CONTINUE these medications which have CHANGED   Details  insulin aspart protamine- aspart (NOVOLOG MIX 70/30) (70-30) 100 UNIT/ML injection Inject 0.4 mLs (40 Units total) into the skin 2 (two) times daily with a meal. Qty: 10 mL, Refills: 11      CONTINUE these medications which have NOT CHANGED   Details  amitriptyline (ELAVIL) 75 MG tablet Take 1 tablet (75 mg total) by mouth at bedtime. Qty: 30 tablet, Refills: 2    atorvastatin (LIPITOR) 40 MG tablet Take 1 tablet (40 mg total) by mouth daily at 6 PM. Qty: 30 tablet, Refills: 1    famotidine (PEPCID) 20 MG tablet Take 1 tablet (20 mg total) by mouth 2 (two) times daily. Qty: 60 tablet, Refills: 2     lisinopril (PRINIVIL,ZESTRIL) 20 MG tablet Take 1 tablet (20 mg total) by mouth daily. Qty: 30 tablet, Refills: 3    ondansetron (ZOFRAN) 4 MG tablet Take 1 tablet (4 mg total) by mouth every 6 (six) hours. Qty: 12 tablet, Refills: 0    pregabalin (LYRICA) 100 MG capsule Take 1 capsule (100 mg total) by mouth 2 (two) times daily. Qty: 14 capsule, Refills: 0         Follow-up Information    [facility] .   Why:  Please call on Monday April 29, 2016 to schedule a hospital followup  appointment.  Contact information: [address] [place] [address] [phone]          TOTAL DISCHARGE TIME: 35 minutes  [facility]  [facility] Pager [phone]  04/24/2016, 2:39 PM

## 2016-04-24 NOTE — Progress Notes (Signed)
Patient discharged to home with family. Discharge instructions reviewed. Followup appt reviewed. IV removed. Telemetry removed. Script in hand. Belongings with the patient. Patient left unit in stable condition in wheelchair.  Avelina LaineKimberly Avianna Moynahan RN

## 2016-04-24 NOTE — Discharge Instructions (Signed)
Diabetic Ketoacidosis °Diabetic ketoacidosis is a life-threatening complication of diabetes. If it is not treated, it can cause severe dehydration and organ damage and can lead to a coma or death. °CAUSES °This condition develops when there is not enough of the hormone insulin in the body. Insulin helps the body to break down sugar for energy. Without insulin, the body cannot break down sugar, so it breaks down fats instead. This leads to the production of acids that are called ketones. Ketones are poisonous at high levels. °This condition can be triggered by: °· Stress on the body that is brought on by an illness. °· Medicines that raise blood glucose levels. °· Not taking diabetes medicine. °SYMPTOMS °Symptoms of this condition include: °· Fatigue. °· Weight loss. °· Excessive thirst. °· Light-headedness. °· Fruity or sweet-smelling breath. °· Excessive urination. °· Vision changes. °· Confusion or irritability. °· Nausea. °· Vomiting. °· Rapid breathing. °· Abdominal pain. °· Feeling flushed. °DIAGNOSIS °This condition is diagnosed based on a medical history, a physical exam, and blood tests. You may also have a urine test that checks for ketones. °TREATMENT °This condition may be treated with: °· Fluid replacement. This may be done to correct dehydration. °· Insulin injections. These may be given through the skin or through an IV tube. °· Electrolyte replacement. Electrolytes, such as potassium and sodium, may be given in pill form or through an IV tube. °· Antibiotic medicines. These may be prescribed if your condition was caused by an infection. °HOME CARE INSTRUCTIONS °Eating and Drinking °· Drink enough fluids to keep your urine clear or pale yellow. °· If you cannot eat, alternate between drinking fluids with sugar (such as juice) and salty fluids (such as broth or bouillon). °· If you can eat, follow your usual diet and drink sugar-free liquids, such as water. °Other Instructions °· Take insulin as  directed by your health care provider. Do not skip insulin injections. Do not use expired insulin. °· If your blood sugar is over 240 mg/dL, monitor your urine ketones every 4-6 hours. °· If you were prescribed an antibiotic medicine, finish all of it even if you start to feel better. °· Rest and exercise only as directed by your health care provider. °· If you get sick, call your health care provider and begin treatment quickly. Your body often needs extra insulin to fight an illness. °· Check your blood glucose levels regularly. If your blood glucose is high, drink plenty of fluids. This helps to flush out ketones. °SEEK MEDICAL CARE IF: °· Your blood glucose level is too high or too low. °· You have ketones in your urine. °· You have a fever. °· You cannot eat. °· You cannot tolerate fluids. °· You have been vomiting for more than 2 hours. °· You continue to have symptoms of this condition. °· You develop new symptoms. °SEEK IMMEDIATE MEDICAL CARE IF: °· Your blood glucose levels continue to be high (elevated). °· Your monitor reads "high" even when you are taking insulin. °· You faint. °· You have chest pain. °· You have trouble breathing. °· You have a sudden, severe headache. °· You have sudden weakness in one arm or one leg. °· You have sudden trouble speaking or swallowing. °· You have vomiting or diarrhea that gets worse after 3 hours. °· You feel severely fatigued. °· You have trouble thinking. °· You have abdominal pain. °· You are severely dehydrated. Symptoms of severe dehydration include: °¨ Extreme thirst. °¨ Dry mouth. °¨ Blue lips. °¨   Cold hands and feet. °¨ Rapid breathing. °  °This information is not intended to replace advice given to you by your health care provider. Make sure you discuss any questions you have with your health care provider. °  °Document Released: 06/07/2000 Document Revised: 10/25/2014 Document Reviewed: 05/18/2014 °Elsevier Interactive Patient Education ©2016 [employer] ° °

## 2016-04-30 ENCOUNTER — Encounter (HOSPITAL_COMMUNITY): Payer: Self-pay | Admitting: *Deleted

## 2016-04-30 ENCOUNTER — Emergency Department (HOSPITAL_COMMUNITY): Payer: Self-pay

## 2016-04-30 ENCOUNTER — Observation Stay (HOSPITAL_COMMUNITY)
Admission: EM | Admit: 2016-04-30 | Discharge: 2016-05-02 | Disposition: A | Discharge status: Home / Self Care | Payer: Self-pay | Attending: Internal Medicine | Admitting: Internal Medicine

## 2016-04-30 DIAGNOSIS — Z794 Long term (current) use of insulin: Secondary | ICD-10-CM | POA: Insufficient documentation

## 2016-04-30 DIAGNOSIS — E860 Dehydration: Secondary | ICD-10-CM

## 2016-04-30 DIAGNOSIS — K1379 Other lesions of oral mucosa: Secondary | ICD-10-CM

## 2016-04-30 DIAGNOSIS — Z79899 Other long term (current) drug therapy: Secondary | ICD-10-CM | POA: Insufficient documentation

## 2016-04-30 DIAGNOSIS — K056 Periodontal disease, unspecified: Secondary | ICD-10-CM

## 2016-04-30 DIAGNOSIS — E1040 Type 1 diabetes mellitus with diabetic neuropathy, unspecified: Secondary | ICD-10-CM | POA: Insufficient documentation

## 2016-04-30 DIAGNOSIS — E875 Hyperkalemia: Secondary | ICD-10-CM

## 2016-04-30 DIAGNOSIS — N179 Acute kidney failure, unspecified: Secondary | ICD-10-CM | POA: Diagnosis present

## 2016-04-30 DIAGNOSIS — Z720 Tobacco use: Secondary | ICD-10-CM | POA: Diagnosis present

## 2016-04-30 DIAGNOSIS — E1110 Type 2 diabetes mellitus with ketoacidosis without coma: Secondary | ICD-10-CM | POA: Diagnosis present

## 2016-04-30 DIAGNOSIS — K219 Gastro-esophageal reflux disease without esophagitis: Secondary | ICD-10-CM | POA: Insufficient documentation

## 2016-04-30 DIAGNOSIS — R739 Hyperglycemia, unspecified: Secondary | ICD-10-CM | POA: Diagnosis present

## 2016-04-30 DIAGNOSIS — F17210 Nicotine dependence, cigarettes, uncomplicated: Secondary | ICD-10-CM | POA: Insufficient documentation

## 2016-04-30 DIAGNOSIS — E1065 Type 1 diabetes mellitus with hyperglycemia: Principal | ICD-10-CM | POA: Insufficient documentation

## 2016-04-30 DIAGNOSIS — F1910 Other psychoactive substance abuse, uncomplicated: Secondary | ICD-10-CM | POA: Diagnosis present

## 2016-04-30 LAB — URINALYSIS, ROUTINE W REFLEX MICROSCOPIC
Bilirubin Urine: NEGATIVE
Hgb urine dipstick: NEGATIVE
Ketones, ur: NEGATIVE mg/dL
Leukocytes, UA: NEGATIVE
NITRITE: NEGATIVE
PROTEIN: NEGATIVE mg/dL
SPECIFIC GRAVITY, URINE: 1.03 (ref 1.005–1.030)
pH: 6 (ref 5.0–8.0)

## 2016-04-30 LAB — BASIC METABOLIC PANEL
Anion gap: 8 (ref 5–15)
BUN: 17 mg/dL (ref 6–20)
CALCIUM: 8.8 mg/dL — AB (ref 8.9–10.3)
CHLORIDE: 95 mmol/L — AB (ref 101–111)
CO2: 23 mmol/L (ref 22–32)
CREATININE: 1.01 mg/dL (ref 0.61–1.24)
GFR calc Af Amer: >60 mL/min (ref >60)
GFR calc non Af Amer: >60 mL/min (ref >60)
GLUCOSE: 951 mg/dL — AB (ref 65–99)
Potassium: 5.5 mmol/L — ABNORMAL HIGH (ref 3.5–5.1)
Sodium: 126 mmol/L — ABNORMAL LOW (ref 135–145)

## 2016-04-30 LAB — CBC
HCT: 38.5 % — ABNORMAL LOW (ref 39.0–52.0)
HEMOGLOBIN: 13.4 g/dL (ref 13.0–17.0)
MCH: 29.3 pg (ref 26.0–34.0)
MCHC: 34.8 g/dL (ref 30.0–36.0)
MCV: 84.1 fL (ref 78.0–100.0)
Platelets: 175 10*3/uL (ref 150–400)
RBC: 4.58 MIL/uL (ref 4.22–5.81)
WBC: 8.4 10*3/uL (ref 4.0–10.5)

## 2016-04-30 LAB — RAPID URINE DRUG SCREEN, HOSP PERFORMED
AMPHETAMINES: NOT DETECTED
BARBITURATES: NOT DETECTED
BENZODIAZEPINES: NOT DETECTED
Cocaine: NOT DETECTED
Opiates: NOT DETECTED
Tetrahydrocannabinol: NOT DETECTED

## 2016-04-30 LAB — CBG MONITORING, ED
Glucose-Capillary: 529 mg/dL (ref 65–99)
Glucose-Capillary: 292 mg/dL — ABNORMAL HIGH (ref 65–99)

## 2016-04-30 LAB — URINE MICROSCOPIC-ADD ON: RBC / HPF: NONE SEEN RBC/hpf (ref 0–5)

## 2016-04-30 LAB — BLOOD GAS, VENOUS
ACID-BASE EXCESS: 0.1 mmol/L (ref 0.0–2.0)
BICARBONATE: 24.8 mmol/L (ref 20.0–28.0)
Drawn by: 295031
FIO2: 21
O2 Saturation: 81.7 %
PCO2 VEN: 42.6 mmHg — AB (ref 44.0–60.0)
PH VEN: 7.382 (ref 7.250–7.430)
Patient temperature: 98.6
pO2, Ven: 50.1 mmHg — ABNORMAL HIGH (ref 32.0–45.0)

## 2016-04-30 LAB — GLUCOSE, CAPILLARY
GLUCOSE-CAPILLARY: 270 mg/dL — AB (ref 65–99)
GLUCOSE-CAPILLARY: 206 mg/dL — AB (ref 65–99)
Glucose-Capillary: 200 mg/dL — ABNORMAL HIGH (ref 65–99)

## 2016-04-30 MED ORDER — DEXTROSE-NACL 5-0.45 % IV SOLN: INTRAVENOUS | Status: DC

## 2016-04-30 MED ORDER — ACETAMINOPHEN 650 MG RE SUPP: 650.0000 mg | Freq: Four times a day (QID) | RECTAL | Status: DC | PRN

## 2016-04-30 MED ORDER — SODIUM CHLORIDE 0.9 % IV BOLUS (SEPSIS)
1000.0000 mL | Freq: Once | INTRAVENOUS | Status: AC
  Administered 2016-04-30: 1000 mL via INTRAVENOUS

## 2016-04-30 MED ORDER — DEXTROSE 50 % IV SOLN: 25.0000 mL | INTRAVENOUS | Status: DC | PRN

## 2016-04-30 MED ORDER — INSULIN ASPART 100 UNIT/ML ~~LOC~~ SOLN
6.0000 [IU] | Freq: Once | SUBCUTANEOUS | Status: AC
  Administered 2016-04-30: 6 [IU] via INTRAVENOUS
  Filled 2016-04-30: qty 1

## 2016-04-30 MED ORDER — ACETAMINOPHEN 325 MG PO TABS
650.0000 mg | ORAL_TABLET | Freq: Four times a day (QID) | ORAL | Status: DC | PRN
  Administered 2016-04-30 – 2016-05-01 (×3): 650 mg via ORAL
  Filled 2016-04-30 (×3): qty 2

## 2016-04-30 MED ORDER — ENOXAPARIN SODIUM 40 MG/0.4ML ~~LOC~~ SOLN
40.0000 mg | SUBCUTANEOUS | Status: DC
  Administered 2016-05-01: 40 mg via SUBCUTANEOUS
  Filled 2016-04-30: qty 0.4

## 2016-04-30 MED ORDER — METOCLOPRAMIDE HCL 5 MG/ML IJ SOLN
10.0000 mg | Freq: Once | INTRAMUSCULAR | Status: AC
  Administered 2016-04-30: 10 mg via INTRAVENOUS
  Filled 2016-04-30: qty 2

## 2016-04-30 MED ORDER — ONDANSETRON HCL 4 MG PO TABS: 4.0000 mg | ORAL_TABLET | Freq: Four times a day (QID) | ORAL | Status: DC | PRN

## 2016-04-30 MED ORDER — SODIUM CHLORIDE 0.9% FLUSH
3.0000 mL | Freq: Two times a day (BID) | INTRAVENOUS | Status: DC
  Administered 2016-04-30 – 2016-05-02 (×4): 3 mL via INTRAVENOUS

## 2016-04-30 MED ORDER — ONDANSETRON HCL 4 MG/2ML IJ SOLN: 4.0000 mg | Freq: Four times a day (QID) | INTRAMUSCULAR | Status: DC | PRN

## 2016-04-30 MED ORDER — SODIUM CHLORIDE 0.9 % IV SOLN
INTRAVENOUS | Status: DC
  Administered 2016-04-30: 2.1 [IU]/h via INTRAVENOUS
  Filled 2016-04-30: qty 2.5

## 2016-04-30 MED ORDER — INSULIN REGULAR BOLUS VIA INFUSION
0.0000 [IU] | Freq: Three times a day (TID) | INTRAVENOUS | Status: DC
  Filled 2016-04-30: qty 10

## 2016-04-30 MED ORDER — DIPHENHYDRAMINE HCL 50 MG/ML IJ SOLN
25.0000 mg | Freq: Once | INTRAMUSCULAR | Status: AC
  Administered 2016-04-30: 25 mg via INTRAVENOUS
  Filled 2016-04-30: qty 1

## 2016-04-30 MED ORDER — SODIUM CHLORIDE 0.9 % IV SOLN
INTRAVENOUS | Status: DC
  Administered 2016-04-30: 21:00:00 via INTRAVENOUS

## 2016-04-30 NOTE — ED Notes (Signed)
ICU still unable to accept patient due to dirty bed status. Will follow up.

## 2016-04-30 NOTE — ED Notes (Signed)
Nurse is going to in the room to collect labs

## 2016-04-30 NOTE — ED Provider Notes (Signed)
[facility]-EMERGENCY DEPT Provider Note   CSN: [id number] Arrival date & time: 04/30/16  1215     History   Chief Complaint Chief Complaint  Patient presents with  . Hyperglycemia    HPI Rickey Smith is a 50 y.o. male.  [id number] year old male with past medical history of poorly controlled type 2 diabetes who presents with a one-day history of general fatigue, nausea, and mild cough. Patient was just hospitalized recently for DKA and states he has been adherent with his diabetes regimen. He states that he awoke today and felt generally unwell. Throughout the course the day, he has had progressively worsening general fatigue, nausea, and lack of energy. He has also had a mild, dry cough.denies any fevers or chills. He states that he has felt similar during his previous episodes of DKA and subsequent presents for evaluation. Denies any chest pain or shortness of breath. He endorses a mild, generalized headache that is similar to his usual migraine headache. Denies any recent head trauma.  Past Medical History:  Diagnosis Date  . Diabetes mellitus without complication (HCC)   . Migraine     Patient Active Problem List   Diagnosis Date Noted  . ARF (acute renal failure) (HCC) 04/23/2016  . Unresponsive episode 04/23/2016  . DKA, type 2 (HCC) 04/23/2016  . Sepsis (HCC) 08/12/2015  . Prostate abscess 08/11/2015  . Abscess 08/11/2015  . UTI (lower urinary tract infection) 08/11/2015  . Diabetic neuropathy (HCC) 08/10/2015  . Arterial hypotension   . Tachycardia 08/02/2015  . SIRS (systemic inflammatory response syndrome) (HCC) 08/02/2015  . GERD (gastroesophageal reflux disease) 10/26/2014  . Leukocytosis 10/23/2014  . Hyperkalemia 10/23/2014  . DKA, type 1 (HCC) 10/23/2014  . Hyperglycemia 07/13/2014  . Diabetic hyperosmolar non-ketotic state (HCC) 07/13/2014  . Diabetes mellitus with nonketotic hyperosmolarity (HCC) 07/13/2014  . Type 1 diabetes mellitus (HCC)   . DKA (diabetic  ketoacidoses) (HCC) 04/26/2014  . Chest pain, ROMI, DDX pericarditis 04/26/2014  . Ex-smoker 04/26/2014  . Hyponatremia 04/26/2014  . Metabolic acidosis 04/26/2014    Past Surgical History:  Procedure Laterality Date  . TRANSURETHRAL RESECTION OF PROSTATE N/A 08/16/2015   Procedure: TRANSURETHRAL RESECTION DRAINAGE OF PROSTATE ABCESS;  Surgeon: Barron Alvineavid Grapey, MD;  Location: [facility];  Service: Urology;  Laterality: N/A;       Home Medications    Prior to Admission medications   Medication Sig Start Date End Date Taking? Authorizing Provider  insulin aspart protamine- aspart (NOVOLOG MIX 70/30) (70-30) 100 UNIT/ML injection Inject 0.4 mLs (40 Units total) into the skin 2 (two) times daily with a meal. 04/24/16  Yes Osvaldo ShipperGokul Krishnan, MD  amitriptyline (ELAVIL) 75 MG tablet Take 1 tablet (75 mg total) by mouth at bedtime. Patient not taking: Reported on 04/30/2016 08/10/15   Jaclyn ShaggyEnobong Amao, MD  atorvastatin (LIPITOR) 40 MG tablet Take 1 tablet (40 mg total) by mouth daily at 6 PM. Patient not taking: Reported on 04/30/2016 10/26/14   Henderson CloudEstela Y Hernandez Acosta, MD  famotidine (PEPCID) 20 MG tablet Take 1 tablet (20 mg total) by mouth 2 (two) times daily. Patient not taking: Reported on 04/30/2016 03/14/16 06/12/16  Alm BustardMatthew O'Sullivan, MD  lisinopril (PRINIVIL,ZESTRIL) 20 MG tablet Take 1 tablet (20 mg total) by mouth daily. Patient not taking: Reported on 04/30/2016 03/15/16 07/13/16  Alm BustardMatthew O'Sullivan, MD  ondansetron (ZOFRAN) 4 MG tablet Take 1 tablet (4 mg total) by mouth every 6 (six) hours. Patient not taking: Reported on 04/30/2016 11/28/15   Jacalyn LefevreJulie Haviland, MD  pregabalin (  LYRICA) 100 MG capsule Take 1 capsule (100 mg total) by mouth 2 (two) times daily. Patient not taking: Reported on 04/30/2016 09/13/15   Pete Glatter, MD    Family History Family History  Problem Relation Age of Onset  . Heart attack    . Prostate cancer    . Hypertension    . Hypertension Mother   . Cancer Father      Social History Social History  Substance Use Topics  . Smoking status: Current Some Day Smoker    Packs/day: 0.50    Years: 20.00    Types: Cigarettes  . Smokeless tobacco: Never Used  . Alcohol use No     Allergies   Codeine and Gabapentin   Review of Systems Review of Systems  Constitutional: Positive for chills and fatigue. Negative for fever.  HENT: Negative for congestion and rhinorrhea.   Eyes: Negative for visual disturbance.  Respiratory: Negative for cough, shortness of breath and wheezing.   Cardiovascular: Negative for chest pain and leg swelling.  Gastrointestinal: Positive for nausea. Negative for abdominal pain, diarrhea and vomiting.  Endocrine: Positive for polydipsia, polyphagia and polyuria.  Genitourinary: Negative for dysuria and flank pain.  Musculoskeletal: Negative for neck pain and neck stiffness.  Skin: Negative for rash and wound.  Allergic/Immunologic: Negative for immunocompromised state.  Neurological: Positive for headaches. Negative for syncope and weakness.  All other systems reviewed and are negative.    Physical Exam Updated Vital Signs BP 150/95   Pulse 95   Temp 97.7 F (36.5 C)   Resp 18   SpO2 100%   Physical Exam  Constitutional: He is oriented to person, place, and time. He appears well-developed and well-nourished.  Disheveled, appears older than stated age  HENT:  Head: Normocephalic and atraumatic.  Markedly dry mucous membranes  Eyes: Conjunctivae are normal.  Neck: Neck supple.  Cardiovascular: Normal rate, regular rhythm and normal heart sounds.  Exam reveals no friction rub.   No murmur heard. Pulmonary/Chest: Effort normal and breath sounds normal. No respiratory distress. He has no wheezes. He has no rales.  Abdominal: He exhibits no distension.  Musculoskeletal: He exhibits no edema.  Neurological: He is alert and oriented to person, place, and time. He exhibits normal muscle tone.  Skin: Skin is warm.  Capillary refill takes less than 2 seconds.  Psychiatric: He has a normal mood and affect.  Nursing note and vitals reviewed.    ED Treatments / Results  Labs (all labs ordered are listed, but only abnormal results are displayed) Labs Reviewed  BASIC METABOLIC PANEL - Abnormal; Notable for the following:       Result Value   Sodium 126 (*)    Potassium 5.5 (*)    Chloride 95 (*)    Glucose, Bld 951 (*)    Calcium 8.8 (*)    All other components within normal limits  CBC - Abnormal; Notable for the following:    HCT 38.5 (*)    All other components within normal limits  URINALYSIS, ROUTINE W REFLEX MICROSCOPIC (NOT AT [facility]) - Abnormal; Notable for the following:    Glucose, UA >1000 (*)    All other components within normal limits  URINE MICROSCOPIC-ADD ON - Abnormal; Notable for the following:    Squamous Epithelial / LPF 0-5 (*)    Bacteria, UA RARE (*)    All other components within normal limits  BLOOD GAS, VENOUS - Abnormal; Notable for the following:    pCO2, Peter Minium  42.6 (*)    pO2, Ven 50.1 (*)    All other components within normal limits  CBG MONITORING, ED - Abnormal; Notable for the following:    Glucose-Capillary >600 (*)    All other components within normal limits  CBG MONITORING, ED - Abnormal; Notable for the following:    Glucose-Capillary 529 (*)    All other components within normal limits  RAPID URINE DRUG SCREEN, HOSP PERFORMED  CBG MONITORING, ED    EKG  EKG Interpretation  Date/Time:  Tuesday April 30 2016 14:28:02 EST Ventricular Rate:  107 PR Interval:    QRS Duration: 83 QT Interval:  327 QTC Calculation: 437 R Axis:   71 Text Interpretation:  Sinus tachycardia Paired ventricular premature complexes Biatrial enlargement No significant change since last tracing Confirmed by Sage Hammill MD, Sheria LangAMERON [phone]([id number]) on 04/30/2016 7:54:59 PM       Radiology Dg Chest 2 View  Result Date: 04/30/2016 CLINICAL DATA:  Recent hospitalization for DKA, nausea  and vomiting EXAM: CHEST  2 VIEW COMPARISON:  04/23/2016 FINDINGS: The heart size and mediastinal contours are within normal limits. Both lungs are clear. Probable old left mid clavicular fracture deformity. IMPRESSION: No active cardiopulmonary disease. Electronically Signed   By: Jasmine PangKim  Fujinaga M.D.   On: 04/30/2016 14:55    Procedures Procedures (including critical care time)  Medications Ordered in ED Medications  sodium chloride 0.9 % bolus 1,000 mL (1,000 mLs Intravenous New Bag/Given 04/30/16 1519)  sodium chloride 0.9 % bolus 1,000 mL (0 mLs Intravenous Stopped 04/30/16 1509)  sodium chloride 0.9 % bolus 1,000 mL (0 mLs Intravenous Stopped 04/30/16 1510)  insulin aspart (novoLOG) injection 6 Units (6 Units Intravenous Given 04/30/16 1519)  metoCLOPramide (REGLAN) injection 10 mg (10 mg Intravenous Given 04/30/16 1533)  diphenhydrAMINE (BENADRYL) injection 25 mg (25 mg Intravenous Given 04/30/16 1534)     Initial Impression / Assessment and Plan / ED Course  I have reviewed the triage vital signs and the nursing notes.  Pertinent labs & imaging results that were available during my care of the patient were reviewed by me and considered in my medical decision making (see chart for details).  Clinical Course     50 year old male with past medical history of poorly controlled diabetes and recurrent DKA who presents with generalized nausea and fatigue. On arrival, vital signs are stable and without hypotension. Patient dehydrated and mildly ill appearing on exam. Lab work confirms severe hyperglycemia with glucose 951. Sodium 126, likely pseudohyponatremia. Of note, pH is normal, bicarbonate is 23, and anion Is 8 without evidence of DKA. Suspect symptomatic hyperglycemia secondary to likely medication nonadherence given history. No signs of infectious process. Chest x-ray is clear. No evidence of UTI. No ketones in the urine. EKG is nonischemic. Will admit for IV fluids, insulin, and close  monitoring.  Final Clinical Impressions(s) / ED Diagnoses   Final diagnoses:  Hyperglycemia  Dehydration    New Prescriptions New Prescriptions   No medications on file     Shaune Pollackameron Anastacio Bua, MD 04/30/16 1956

## 2016-04-30 NOTE — Progress Notes (Addendum)
CM spoke with pt and he confirms not having a pcp Pt informs CM [place]  Pt provided uninsured resources for [place] and [place] to include written information to assist pt with determining choice for uninsured accepting pcps, discussed the importance of pcp vs EDP services for f/u care, www.needymeds.org, www.goodrx.com, discounted pharmacies and other [place] resources such as [facility] , [facility], affordable care act, financial assistance, uninsured dental services, [place] med assist, DSS and  health department  Reviewed resources for [place] uninsured accepting pcps like [facility], family medicine at [address], [facility], [facility], local urgent care centers, [facility], [facility], general medical clinics, family services of the [facility], [facility] care plus others, medication resources, [facility] out patient pharmacies and housing Pt voiced understanding and appreciation of resources provided   Provided [facility] contact information pt not eligible for [facility] - [place] per [place] and visits [place]   Pt was [facility] to [facility] but he did not comply

## 2016-04-30 NOTE — H&P (Signed)
History and Physical  Rickey HooseWilliam S Sofia ZOX:[id number] DOB: 09/08/1965 DOA: 04/30/2016  Referring physician:  Shaune Pollackameron Isaacs, ER physician PCP: No PCP Per Patient Reather Littler/he is given referral, but has not yet established with the community [facility]  Patient coming from: Home & is able to ambulate without assistance, although he does have issues because of his neuropathy  Chief Complaint: Weakness and high sugar   HPI: Rickey Smith is a 50 y.o. male with medical history significant of poorly controlled diabetes mellitus type 1 with secondary neuropathy as well as polysubstance abuse who was just discharged from the hospitalist service a week ago for DKA. Patient states he's been in his good state of health since then. He states that he is not use drugs in almost a month. He states he was found fine until today when all was said he felt very lightheaded and weak and checked his sugar and found to be high. He came into the emergency room where blood sugar was found to be elevated at 987. ED Course: In the emergency room, lab work was done which noted a slight elevation in his creatinine, no anion gap (8), and normal white count. An ABG was within normal range. Patient was felt to be in nonketotic hyperglycemia. Potassium was slightly elevated at 5.5 but an EKG noted no peaked T waves. Patient was started on IV fluids and an insulin drip and hospitalist were called for admission. Follow-up sugar a few hours later was down to 500  Review of Systems: Patient seen in the emergency room . Pt complains of feeling very weak, hungry as well as chronic lower extremity numbness and pain from his neuropathy . He stated he had some blurry vision earlier today but it has since improved somewhat. He said that he had one episode of loose stool earlier today  Pt denies any headaches, dysphagia, chest pain, palpitations, shortness breath, wheeze, cough, abdominal pain, hematuria, dysuria, constipation, focal  extremity weakness..  Review of systems are otherwise negative  Past Medical History:  Diagnosis Date  . Diabetes mellitus without complication (HCC)   . Migraine    Past Surgical History:  Procedure Laterality Date  . TRANSURETHRAL RESECTION OF PROSTATE N/A 08/16/2015   Procedure: TRANSURETHRAL RESECTION DRAINAGE OF PROSTATE ABCESS;  Surgeon: Barron Alvineavid Grapey, MD;  Location: [facility];  Service: Urology;  Laterality: N/A;    Social History:  reports that he has been smoking Cigarettes.  He has a 10.00 pack-year smoking history. He has never used smokeless tobacco. He reports that he uses drugs, including Cocaine and Other-see comments. He reports that he does not drink alcohol.  Patient states he has not used drugs in over a month. He lives at home by himself Allergies  Allergen Reactions  . Codeine Itching  . Gabapentin Diarrhea and Nausea And Vomiting    Family History  Problem Relation Age of Onset  . Heart attack    . Prostate cancer    . Hypertension    . Hypertension Mother   . Cancer Father       Prior to Admission medications   Medication Sig Start Date End Date Taking? Authorizing Provider  insulin aspart protamine- aspart (NOVOLOG MIX 70/30) (70-30) 100 UNIT/ML injection Inject 0.4 mLs (40 Units total) into the skin 2 (two) times daily with a meal. 04/24/16  Yes Osvaldo ShipperGokul Fe Okubo, MD  amitriptyline (ELAVIL) 75 MG tablet Take 1 tablet (75 mg total) by mouth at bedtime. Patient not taking: Reported on 04/30/2016 08/10/15  Jaclyn Shaggy, MD  atorvastatin (LIPITOR) 40 MG tablet Take 1 tablet (40 mg total) by mouth daily at 6 PM. Patient not taking: Reported on 04/30/2016 10/26/14   Henderson Cloud, MD  famotidine (PEPCID) 20 MG tablet Take 1 tablet (20 mg total) by mouth 2 (two) times daily. Patient not taking: Reported on 04/30/2016 03/14/16 06/12/16  Alm Bustard, MD  lisinopril (PRINIVIL,ZESTRIL) 20 MG tablet Take 1 tablet (20 mg total) by mouth daily. Patient not  taking: Reported on 04/30/2016 03/15/16 07/13/16  Alm Bustard, MD  ondansetron (ZOFRAN) 4 MG tablet Take 1 tablet (4 mg total) by mouth every 6 (six) hours. Patient not taking: Reported on 04/30/2016 11/28/15   Jacalyn Lefevre, MD  pregabalin (LYRICA) 100 MG capsule Take 1 capsule (100 mg total) by mouth 2 (two) times daily. Patient not taking: Reported on 04/30/2016 09/13/15   Pete Glatter, MD    Physical Exam: BP 120/75   Pulse 95   Temp 97.7 F (36.5 C)   Resp 16   SpO2 99%   General:  Alert and oriented 3, no acute distress  Eyes: Sclera nonicteric a much ocular movements are intact  ENT: Normocephalic, atraumatic, mucous membranes are dry Neck: No carotid bruits  Cardiovascular: Regular rate and rhythm, S1-S2  Respiratory: Clear to auscultation bilaterally  Abdomen: Soft, nontender, nondistended, positive bowel sounds  Skin: No skin breaks, tears or lesions, tattoos on his arms  Musculoskeletal: No clubbing or cyanosis or edema  Psychiatric: Patient is appropriate, no evidence of psychoses  Neurologic: No focal deficits, he does have smooth skin on his lower extremities consistent with diabetic neuropathy and decreased sensation           Labs on Admission:  Basic Metabolic Panel:  Recent Labs Lab 04/24/16 0636 04/30/16 1244  NA 138 126*  K 3.0* 5.5*  CL 110 95*  CO2 21* 23  GLUCOSE 176* 951*  BUN 5* 17  CREATININE 0.66 1.01  CALCIUM 8.0* 8.8*   Liver Function Tests: No results for input(s): AST, ALT, ALKPHOS, BILITOT, PROT, ALBUMIN in the last 168 hours. No results for input(s): LIPASE, AMYLASE in the last 168 hours. No results for input(s): AMMONIA in the last 168 hours. CBC:  Recent Labs Lab 04/30/16 1244  WBC 8.4  HGB 13.4  HCT 38.5*  MCV 84.1  PLT 175   Cardiac Enzymes: No results for input(s): CKTOTAL, CKMB, CKMBINDEX, TROPONINI in the last 168 hours.  BNP (last 3 results)  Recent Labs  08/02/15 1306  BNP 30.1    ProBNP (last 3  results) No results for input(s): PROBNP in the last 8760 hours.  CBG:  Recent Labs Lab 04/24/16 0752 04/24/16 1129 04/30/16 1242 04/30/16 1507 04/30/16 1719  GLUCAP 222* 180* >600* 529* 292*    Radiological Exams on Admission: Dg Chest 2 View  Result Date: 04/30/2016 CLINICAL DATA:  Recent hospitalization for DKA, nausea and vomiting EXAM: CHEST  2 VIEW COMPARISON:  04/23/2016 FINDINGS: The heart size and mediastinal contours are within normal limits. Both lungs are clear. Probable old left mid clavicular fracture deformity. IMPRESSION: No active cardiopulmonary disease. Electronically Signed   By: Jasmine Pang M.D.   On: 04/30/2016 14:55    EKG: Sinus tachy, few pvc's   Assessment/Plan Present on Admission: . Hyperglycemia without ketosis in patient with Type 1 diabetes, uncontrolled, with neuropathy (HCC): Unclear etiology. I do not think that actually this is from drugs given normal drug screen. No signs of acute infection. Patient's  worsening is being compliant. Regardless, anticipate quick turnaround. Nothing by mouth and fluids plus insulin drip. When sugars normalized, change over to by mouth and resume normal insulin . ARF (acute renal failure) (HCC): Mild, quick change within 24 hours. Secondary to hyperglycemia. . Tobacco abuse: Patient says he's cutting back down to one pack of cigarettes a week. Declined nicotine patch . Polysubstance abuse: Patient states he does not use drugs in over a month. His urine drug screen checked at this time as compared to week ago was normal. Hyperkalemia: Secondary to fluid shifts. With treating hyperglycemia, this will reverse. Hyponatremia: This is actually pseudohyponatremia in the setting of hyperglycemia  Principal Problem:   Hyperglycemia without ketosis Active Problems:   Type 1 diabetes, uncontrolled, with neuropathy (HCC)   ARF (acute renal failure) (HCC)   Tobacco abuse   Polysubstance abuse   DVT prophylaxis: lovenox    Code Status: full   Family Communication: no family present, pt said he didn't have anyone for me to call   Disposition Plan: Stepdown unit given close monitoring, however quick turnaround and anticipate discharge home tomorrow   Consults called: None   Admission status: Given anticipation for discharge on tomorrow, placed in observation     Hollice EspyKRISHNAN,Jakin Pavao K MD [facility] Pager [phone]- [phone]  If 7PM-7AM, please contact night-coverage [email or web address] Password [email or web address]  04/30/2016, 5:56 PM

## 2016-04-30 NOTE — ED Notes (Signed)
Patient transported to X-ray 

## 2016-04-30 NOTE — ED Notes (Signed)
Attempted to start timer to bring pt to floor. Unable to do so at this time per charge nurse. Will call back in 30 minutes as instructed.

## 2016-04-30 NOTE — ED Notes (Signed)
Patient states his blood glucose was "fine" this morning - @120 .  He felt "weird" around mid-morning and had his blood glucose checked.  It was too high to read on a monitor.  Patient denies N/V, but does endorse increased frequency of stool.  Patient also c/o headache.  Patient appears chronically ill, but is in no distress.

## 2016-04-30 NOTE — ED Triage Notes (Signed)
Per EMS pt checked his CBG this am it was 120, went to his mom, had something to eat and was feeling bad. He stopped at fire station and EMS was called, his cbg at this time was too high to read. Pt was recently hospitalized for DKA. He reports n/v and headache, PIV established PTA, 4 mg zofran and NS bolus given en route

## 2016-04-30 NOTE — Progress Notes (Signed)
entered in d/cinstructions Please use the resources provided to you in emergency room by case manager to assist you're your choice of doctor for follow [place] in [place]        Next Steps: Schedule an appointment as soon as possible for a visit

## 2016-05-01 ENCOUNTER — Observation Stay (HOSPITAL_COMMUNITY): Payer: Self-pay

## 2016-05-01 DIAGNOSIS — N179 Acute kidney failure, unspecified: Secondary | ICD-10-CM

## 2016-05-01 DIAGNOSIS — R739 Hyperglycemia, unspecified: Secondary | ICD-10-CM

## 2016-05-01 DIAGNOSIS — E1065 Type 1 diabetes mellitus with hyperglycemia: Secondary | ICD-10-CM

## 2016-05-01 DIAGNOSIS — E1040 Type 1 diabetes mellitus with diabetic neuropathy, unspecified: Secondary | ICD-10-CM

## 2016-05-01 LAB — BASIC METABOLIC PANEL
ANION GAP: 7 (ref 5–15)
BUN: 10 mg/dL (ref 6–20)
CALCIUM: 8.2 mg/dL — AB (ref 8.9–10.3)
CO2: 26 mmol/L (ref 22–32)
Chloride: 103 mmol/L (ref 101–111)
Creatinine, Ser: 0.61 mg/dL (ref 0.61–1.24)
Glucose, Bld: 134 mg/dL — ABNORMAL HIGH (ref 65–99)
POTASSIUM: 3.1 mmol/L — AB (ref 3.5–5.1)
Sodium: 136 mmol/L (ref 135–145)

## 2016-05-01 LAB — GLUCOSE, CAPILLARY
GLUCOSE-CAPILLARY: 156 mg/dL — AB (ref 65–99)
GLUCOSE-CAPILLARY: 109 mg/dL — AB (ref 65–99)
GLUCOSE-CAPILLARY: 379 mg/dL — AB (ref 65–99)
GLUCOSE-CAPILLARY: 145 mg/dL — AB (ref 65–99)
GLUCOSE-CAPILLARY: 139 mg/dL — AB (ref 65–99)
Glucose-Capillary: 118 mg/dL — ABNORMAL HIGH (ref 65–99)
Glucose-Capillary: 132 mg/dL — ABNORMAL HIGH (ref 65–99)
Glucose-Capillary: 142 mg/dL — ABNORMAL HIGH (ref 65–99)
Glucose-Capillary: 144 mg/dL — ABNORMAL HIGH (ref 65–99)
Glucose-Capillary: 170 mg/dL — ABNORMAL HIGH (ref 65–99)

## 2016-05-01 LAB — CBC
HCT: 35.3 % — ABNORMAL LOW (ref 39.0–52.0)
HEMOGLOBIN: 12 g/dL — AB (ref 13.0–17.0)
MCH: 28.8 pg (ref 26.0–34.0)
MCHC: 34 g/dL (ref 30.0–36.0)
MCV: 84.9 fL (ref 78.0–100.0)
Platelets: 176 10*3/uL (ref 150–400)
RBC: 4.16 MIL/uL — AB (ref 4.22–5.81)
RDW: 12.9 % (ref 11.5–15.5)
WBC: 6.7 10*3/uL (ref 4.0–10.5)

## 2016-05-01 MED ORDER — POTASSIUM CHLORIDE CRYS ER 10 MEQ PO TBCR
30.0000 meq | EXTENDED_RELEASE_TABLET | Freq: Once | ORAL | Status: AC
  Administered 2016-05-01: 30 meq via ORAL
  Filled 2016-05-01: qty 1

## 2016-05-01 MED ORDER — IOPAMIDOL (ISOVUE-300) INJECTION 61%
75.0000 mL | Freq: Once | INTRAVENOUS | Status: AC | PRN
  Administered 2016-05-01: 75 mL via INTRAVENOUS

## 2016-05-01 MED ORDER — INSULIN ASPART PROT & ASPART (70-30 MIX) 100 UNIT/ML ~~LOC~~ SUSP
40.0000 [IU] | Freq: Two times a day (BID) | SUBCUTANEOUS | Status: DC
  Administered 2016-05-01 – 2016-05-02 (×3): 40 [IU] via SUBCUTANEOUS
  Filled 2016-05-01 (×2): qty 10

## 2016-05-01 MED ORDER — BUTALBITAL-APAP-CAFFEINE 50-325-40 MG PO TABS
2.0000 | ORAL_TABLET | Freq: Once | ORAL | Status: AC
  Administered 2016-05-01: 2 via ORAL
  Filled 2016-05-01: qty 2

## 2016-05-01 MED ORDER — INSULIN ASPART 100 UNIT/ML ~~LOC~~ SOLN
0.0000 [IU] | Freq: Three times a day (TID) | SUBCUTANEOUS | Status: DC
  Administered 2016-05-01 (×2): 3 [IU] via SUBCUTANEOUS
  Administered 2016-05-01: 15 [IU] via SUBCUTANEOUS
  Administered 2016-05-02: 2 [IU] via SUBCUTANEOUS
  Administered 2016-05-02: 3 [IU] via SUBCUTANEOUS

## 2016-05-01 MED ORDER — LIVING WELL WITH DIABETES BOOK
Freq: Once | Status: AC
  Administered 2016-05-01: 12:00:00
  Filled 2016-05-01: qty 1

## 2016-05-01 MED ORDER — POTASSIUM CHLORIDE CRYS ER 20 MEQ PO TBCR
30.0000 meq | EXTENDED_RELEASE_TABLET | Freq: Once | ORAL | Status: AC
  Administered 2016-05-01: 30 meq via ORAL
  Filled 2016-05-01: qty 1

## 2016-05-01 NOTE — Care Management Note (Signed)
Case Management Note  Patient Details  Name: KAI CALICO MRN: [id number] Date of Birth: [birth year]  Subjective/Objective:                  hyperglycemia Action/Plan: Discharge planning Expected Discharge Date:  05/02/16               Expected Discharge Plan:     In-House Referral:     Discharge planning Services     Post Acute Care Choice:    Choice offered to:     DME Arranged:    DME Agency:     HH Arranged:    [place] Agency:     Status of Service:     If discussed at [employer] of Stay Meetings, dates discussed:    Additional Comments: CM met with pt who missed his [facility] appt 04/29/16; according to the clinic administrator Venetia Night this pt was a no-show.  This CM spoke with pt about the importance of GOING to his appts and he agrees he will make the next appt.  At this time Venetia Night is requesting the pt call on Monday November 13 to secure an appt.  This CM has placed this directive on pt's AVS.  Pt states he gets his insurlin from [facility].  CM has given pt [employer] [place] resource handout to call for a FREE navigator appt to secure insurance.  No other CM needs were communicated. Dellie Catholic, RN 05/01/2016, 2:03 PM

## 2016-05-01 NOTE — Progress Notes (Signed)
[facility] PROGRESS NOTE    Progress Note  Rickey HooseWilliam S Satchell  OZD:[id number] DOB: 03/03/1966 DOA: 04/30/2016 PCP: No PCP Per Patient     Brief Narrative:   Rickey Smith is an 50 y.o. male past medical history of poorly controlled diabetes mellitus type 1 peripheral neuropathy polysubstance abuse discharge from the hospitalist service about a week ago for DKA comes into the hospital with hyperglycemia.  Assessment/Plan:   Hyperglycemia without ketosis/Type 1 diabetes, uncontrolled, with neuropathy (HCC) Unclear etiology, he has poor oral hygiene. We'll check an orthopantogram. As now he has  mouth pain. Insulin drip is off and now on 70/30 continue check CBGs before meals and at bedtime. That he was taking his insulin regularly, he thinks the 1 that he has at home might have expired.  ARF (acute renal failure) (HCC) (ON etiology resolved with IV fluid hydration.  Tobacco abuse Counseling.  Polysubstance abuse This was negative.   DVT prophylaxis: lovenox Family Communication:none Disposition Plan/Barrier to D/C: Hopefully in am Code Status:     Code Status Orders        Start     Ordered   04/30/16 2018  Full code  Continuous     04/30/16 2017    Code Status History    Date Active Date Inactive Code Status Order ID Comments User Context   04/23/2016  2:24 AM 04/24/2016  6:35 PM Full Code [id number]  Eduard ClosArshad N Kakrakandy, MD ED   03/11/2016  4:02 PM 03/14/2016  5:12 PM Full Code [id number]  Servando SnareAlexa R Burns, MD Inpatient   08/11/2015  3:13 PM 08/17/2015  4:13 PM Full Code [id number]  Gwenyth BenderKaren M Black, NP ED   08/02/2015  7:12 PM 08/04/2015  4:43 PM Full Code [id number]  Zannie CovePreetha Joseph, MD Inpatient   10/23/2014 12:32 PM 10/26/2014  6:31 PM Full Code [id number]  Marinda ElkAbraham Feliz Ortiz, MD Inpatient   07/13/2014  9:28 AM 07/14/2014  5:05 PM Full Code [id number]  Jerald KiefStephen K Chiu, MD Inpatient   04/26/2014 12:51 PM 04/28/2014  7:04 PM Full Code [id number]  Rhetta MuraJai-Gurmukh Samtani, MD Inpatient         IV Access:    Peripheral IV   Procedures and diagnostic studies:   Dg Chest 2 View  Result Date: 04/30/2016 CLINICAL DATA:  Recent hospitalization for DKA, nausea and vomiting EXAM: CHEST  2 VIEW COMPARISON:  04/23/2016 FINDINGS: The heart size and mediastinal contours are within normal limits. Both lungs are clear. Probable old left mid clavicular fracture deformity. IMPRESSION: No active cardiopulmonary disease. Electronically Signed   By: Jasmine PangKim  Fujinaga M.D.   On: 04/30/2016 14:55     Medical Consultants:    None.  Anti-Infectives:   NONE  Subjective:    Rickey HooseWilliam S Friend he relates mouth pain  Objective:    Vitals:   05/01/16 0300 05/01/16 0400 05/01/16 0500 05/01/16 0600  BP: (!) 113/51 136/86 (!) 140/91 (!) 118/54  Pulse: 78 79 78 81  Resp: 16 19 17 17   Temp:  98.5 F (36.9 C)    TempSrc:  Oral    SpO2: 98% 97% 97% 99%  Weight:   64.1 kg (141 lb 5 oz)   Height:   6\' 1"  (1.854 m)     Intake/Output Summary (Last 24 hours) at 05/01/16 1122 Last data filed at 05/01/16 0830  Gross per 24 hour  Intake          1924.38 ml  Output  2375 ml  Net          -450.62 ml   Filed Weights   05/01/16 0500  Weight: 64.1 kg (141 lb 5 oz)    Exam: General exam: In no acute distress. Respiratory system: Good air movement and clear to auscultation. Cardiovascular system: S1 & S2 heard, RRR.  Gastrointestinal system: Abdomen is nondistended, soft and nontender.  Central nervous system: Alert and oriented. No focal neurological deficits. Extremities: No pedal edema. Skin: No rashes, lesions or ulcers Psychiatry: Judgement and insight appear normal. Mood & affect appropriate.    Data Reviewed:    Labs: Basic Metabolic Panel:  Recent Labs Lab 04/30/16 1244 05/01/16 0303  NA 126* 136  K 5.5* 3.1*  CL 95* 103  CO2 23 26  GLUCOSE 951* 134*  BUN 17 10  CREATININE 1.01 0.61  CALCIUM 8.8* 8.2*   GFR Estimated Creatinine Clearance: 101.3  mL/min (by C-G formula based on SCr of 0.61 mg/dL). Liver Function Tests: No results for input(s): AST, ALT, ALKPHOS, BILITOT, PROT, ALBUMIN in the last 168 hours. No results for input(s): LIPASE, AMYLASE in the last 168 hours. No results for input(s): AMMONIA in the last 168 hours. Coagulation profile No results for input(s): INR, PROTIME in the last 168 hours.  CBC:  Recent Labs Lab 04/30/16 1244 05/01/16 0303  WBC 8.4 6.7  HGB 13.4 12.0*  HCT 38.5* 35.3*  MCV 84.1 84.9  PLT 175 176   Cardiac Enzymes: No results for input(s): CKTOTAL, CKMB, CKMBINDEX, TROPONINI in the last 168 hours. BNP (last 3 results) No results for input(s): PROBNP in the last 8760 hours. CBG:  Recent Labs Lab 05/01/16 0141 05/01/16 0245 05/01/16 0353 05/01/16 0455 05/01/16 0739  GLUCAP 118* 144* 156* 109* 379*   D-Dimer: No results for input(s): DDIMER in the last 72 hours. Hgb A1c: No results for input(s): HGBA1C in the last 72 hours. Lipid Profile: No results for input(s): CHOL, HDL, LDLCALC, TRIG, CHOLHDL, LDLDIRECT in the last 72 hours. Thyroid function studies: No results for input(s): TSH, T4TOTAL, T3FREE, THYROIDAB in the last 72 hours.  Invalid input(s): FREET3 Anemia work up: No results for input(s): VITAMINB12, FOLATE, FERRITIN, TIBC, IRON, RETICCTPCT in the last 72 hours. Sepsis Labs:  Recent Labs Lab 04/30/16 1244 05/01/16 0303  WBC 8.4 6.7   Microbiology Recent Results (from the past 240 hour(s))  MRSA PCR Screening     Status: None   Collection Time: 04/23/16  3:02 PM  Result Value Ref Range Status   MRSA by PCR NEGATIVE NEGATIVE Final    Comment:        The GeneXpert MRSA Assay (FDA approved for NASAL specimens only), is one component of a comprehensive MRSA colonization surveillance program. It is not intended to diagnose MRSA infection nor to guide or monitor treatment for MRSA infections.      Medications:   . enoxaparin (LOVENOX) injection  40 mg  Subcutaneous Q24H  . insulin aspart  0-15 Units Subcutaneous TID WC  . insulin aspart protamine- aspart  40 Units Subcutaneous BID WC  . living well with diabetes book   Does not apply Once  . potassium chloride  30 mEq Oral Once  . sodium chloride flush  3 mL Intravenous Q12H   Continuous Infusions:  Time spent: 25 min   LOS: 0 days   Marinda Elk  [facility] Pager [phone]  *Please refer to [email or web address], password [email or web address] to get updated schedule on who will round on this patient,  as hospitalists switch teams weekly. If 7PM-7AM, please contact night-coverage at [email or web address], password [email or web address] for any overnight needs.  05/01/2016, 11:22 AM

## 2016-05-02 DIAGNOSIS — K056 Periodontal disease, unspecified: Secondary | ICD-10-CM

## 2016-05-02 LAB — GLUCOSE, CAPILLARY
GLUCOSE-CAPILLARY: 140 mg/dL — AB (ref 65–99)
GLUCOSE-CAPILLARY: 156 mg/dL — AB (ref 65–99)

## 2016-05-02 NOTE — Discharge Summary (Addendum)
Physician Discharge Summary  Rickey HooseWilliam S Smith YNW:[id number] DOB: 04/30/1966 DOA: 04/30/2016  PCP: No PCP Per Patient  Admit date: 04/30/2016 Discharge date: 05/13/2016  Admitted From: home Disposition:  Home  Recommendations for Outpatient Follow-up:  1. Follow up with [facility] in 1-2 weeks   Home Health:no Equipment/Devices:Home  Discharge Condition:stable CODE STATUS:full Diet recommendation:Carb Modified  Brief/Interim Summary: 50 y.o. male past medical history of poorly controlled diabetes mellitus type 1 peripheral neuropathy polysubstance abuse discharge from the hospitalist service about a week ago for DKA comes into the hospital with hyperglycemia  Discharge Diagnoses:  Principal Problem:   Hyperglycemia without ketosis Active Problems:   ARF (acute renal failure) (HCC)   DKA, type 2 (HCC)   Tobacco abuse   Polysubstance abuse   Hyperglycemia   Periodontal disease  Hyperglycemia without ketosis/Type 1 diabetes, uncontrolled, with neuropathy (HCC) Unclear etiology, he has poor oral hygiene. Ct maxillofacial showed mutiple periodontal lucencecies Insulin drip is off and now on 70/30, BG better control.  ARF (acute renal failure) (HCC) Pre-renal etiology resolved with IV fluid hydration.  Tobacco abuse Counseling.  Polysubstance abuse This was negative.  Discharge Instructions  Discharge Instructions    Diet - low sodium heart healthy    Complete by:  As directed    Increase activity slowly    Complete by:  As directed        Medication List    TAKE these medications   amitriptyline 75 MG tablet Commonly known as:  ELAVIL Take 1 tablet (75 mg total) by mouth at bedtime.   atorvastatin 40 MG tablet Commonly known as:  LIPITOR Take 1 tablet (40 mg total) by mouth daily at 6 PM.   famotidine 20 MG tablet Commonly known as:  PEPCID Take 1 tablet (20 mg total) by mouth 2 (two) times daily.   insulin aspart protamine- aspart (70-30) 100 UNIT/ML  injection Commonly known as:  NOVOLOG MIX 70/30 Inject 0.4 mLs (40 Units total) into the skin 2 (two) times daily with a meal.   lisinopril 20 MG tablet Commonly known as:  PRINIVIL,ZESTRIL Take 1 tablet (20 mg total) by mouth daily.   ondansetron 4 MG tablet Commonly known as:  ZOFRAN Take 1 tablet (4 mg total) by mouth every 6 (six) hours.   pregabalin 100 MG capsule Commonly known as:  LYRICA Take 1 capsule (100 mg total) by mouth 2 (two) times daily.      Follow-up Information    Please use the resources provided to you in emergency room by case manager to assist you're your choice of doctor for follow up in either [place] or [place]. Schedule an appointment as soon as possible for a visit.        [employer] [place] Follow up.   Why:  Open enrollment for 2018 coverage runs through Dec. 15, 2017. Have questions? Need help? Don't delay! Call [phone] (toll-free) or use the Get Covered Connector below to schedule your free, local, in-person navigator appointment today! Contact information: [phone]       [facility] Follow up.   Why:  Because you were a NO SHOW for your last appointment, please call this clinic on Monday November 13 to get another follow up from hospital appointment. Contact information: [address] [place] [phone]         Allergies  Allergen Reactions  . Codeine Itching  . Gabapentin Diarrhea and Nausea And Vomiting  Consultations:  none   Procedures/Studies: Dg Chest 2 View  Result Date: 04/30/2016 CLINICAL DATA:  Recent hospitalization for DKA, nausea and vomiting EXAM: CHEST  2 VIEW COMPARISON:  04/23/2016 FINDINGS: The heart size and mediastinal contours are within normal limits. Both lungs are clear. Probable old left mid clavicular fracture deformity. IMPRESSION: No active cardiopulmonary disease. Electronically Signed   By: Jasmine Pang M.D.   On: 04/30/2016 14:55   Ct Head Wo Contrast  Result Date: 04/22/2016 CLINICAL DATA:  50 y/o M; altered mental status with fever and sepsis. EXAM: CT HEAD WITHOUT CONTRAST TECHNIQUE: Contiguous axial images were obtained from the base of the skull through the vertex without intravenous contrast. COMPARISON:  04/16/2015 CT head. FINDINGS: Brain: No evidence of acute infarction, hemorrhage, hydrocephalus, extra-axial collection or mass lesion/mass effect. Vascular: No hyperdense vessel or unexpected calcification. Skull: Normal. Negative for fracture or focal lesion. Sinuses/Orbits: Visualized paranasal sinuses and mastoid air cells are normally aerated. Orbits are unremarkable. Left external auditory canal opacification is probably cerumen. Other: None. IMPRESSION: No acute intracranial abnormality is identified. Unremarkable CT of the head for age. Electronically Signed   By: Mitzi Hansen M.D.   On: 04/22/2016 23:20   Ct Maxillofacial W Contrast  Result Date: 05/01/2016 CLINICAL DATA:  Right lower jaw pain with multiple teeth missing EXAM: CT MAXILLOFACIAL WITH CONTRAST TECHNIQUE: Multidetector CT imaging of the maxillofacial structures was performed with intravenous contrast. Multiplanar CT image reconstructions were also generated. A small metallic BB was placed on the right temple in order to reliably differentiate right from left. CONTRAST:  75mL ISOVUE-300 IOPAMIDOL (ISOVUE-300) INJECTION 61% COMPARISON:  04/22/2016 FINDINGS: Osseous: Mastoid air cells are clear. Mandibular heads appear normally positioned. Zygomatic arches and pterygoid plates are intact. Minimal nasal bone deformity likely old fracture. There is no evidence for mandibular fracture. There are multiple missing teeth from the upper jaw, including the incisors. Lucency around the roots of remaining right and left premolar teeth suggest periodontal disease. Possible fractured left upper molar teeth. Mild lucency  around the roots around the right second and left first molar teeth consistent with periodontal disease. No periostitis or bone destruction to suggest osteomyelitis. Orbits: Orbital walls appear grossly intact. No intra or extraconal soft tissue abnormality. Sinuses: Sphenoid sinuses clear. Mild mucosal thickening in the ethmoid sinuses. Maxillary sinuses appear clear. Soft tissues: No evidence for rim enhancing fluid collection within the soft tissues to suggest soft tissue abscess. Normal epiglottis. Imaged salivary glands are within normal limits. Limited intracranial: Unremarkable IMPRESSION: 1. No evidence for soft tissue abscess. No bony changes to suggest osteomyelitis. 2. Poor dentition with multiple missing teeth from the mandible and maxilla. There are multifocal lucencies around the roots of several teeth as described above consistent with periodontal disease. Electronically Signed   By: Jasmine Pang M.D.   On: 05/01/2016 16:15   Dg Abd Acute W/chest  Result Date: 04/23/2016 CLINICAL DATA:  Acute onset of mid abdominal pain, nausea and vomiting. Initial encounter. EXAM: DG ABDOMEN ACUTE W/ 1V CHEST COMPARISON:  Chest radiograph performed 03/12/2016 FINDINGS: The lungs are well-aerated. Mild peribronchial thickening is noted. There is no evidence of focal opacification, pleural effusion or pneumothorax. The cardiomediastinal silhouette is within normal limits. The visualized bowel gas pattern is unremarkable. Scattered stool and air are seen within the colon; there is no evidence of small bowel dilatation to suggest obstruction. No free intra-abdominal air is identified on the provided upright view. The stomach is partially filled with air. No  acute osseous abnormalities are seen; the sacroiliac joints are unremarkable in appearance. IMPRESSION: 1. Unremarkable bowel gas pattern; no free intra-abdominal air seen. Moderate amount of stool noted in the colon. 2. Mild peribronchial thickening noted.  Lungs otherwise grossly clear. Electronically Signed   By: Roanna RaiderJeffery  Chang M.D.   On: 04/23/2016 03:31      Subjective: No complains  Discharge Exam: Vitals:   05/02/16 0915 05/02/16 1300  BP: 112/70 111/66  Pulse: 87 93  Resp: 16 16  Temp: 97.8 F (36.6 C) 98.8 F (37.1 C)   Vitals:   05/01/16 2129 05/02/16 0544 05/02/16 0915 05/02/16 1300  BP: 116/69 113/69 112/70 111/66  Pulse: 87 80 87 93  Resp: 14 14 16 16   Temp: 98 F (36.7 C) 97.8 F (36.6 C) 97.8 F (36.6 C) 98.8 F (37.1 C)  TempSrc: Oral Oral Oral Oral  SpO2: 99% 97% 100% 100%  Weight:      Height:        General: Pt is alert, awake, not in acute distress Cardiovascular: RRR, S1/S2 +, no rubs, no gallops Respiratory: CTA bilaterally, no wheezing, no rhonchi Abdominal: Soft, NT, ND, bowel sounds + Extremities: no edema, no cyanosis    The results of significant diagnostics from this hospitalization (including imaging, microbiology, ancillary and laboratory) are listed below for reference.     Microbiology: No results found for this or any previous visit (from the past 240 hour(s)).   Labs: BNP (last 3 results)  Recent Labs  08/02/15 1306  BNP 30.1   Basic Metabolic Panel: No results for input(s): NA, K, CL, CO2, GLUCOSE, BUN, CREATININE, CALCIUM, MG, PHOS in the last 168 hours. Liver Function Tests: No results for input(s): AST, ALT, ALKPHOS, BILITOT, PROT, ALBUMIN in the last 168 hours. No results for input(s): LIPASE, AMYLASE in the last 168 hours. No results for input(s): AMMONIA in the last 168 hours. CBC: No results for input(s): WBC, NEUTROABS, HGB, HCT, MCV, PLT in the last 168 hours. Cardiac Enzymes: No results for input(s): CKTOTAL, CKMB, CKMBINDEX, TROPONINI in the last 168 hours. BNP: Invalid input(s): POCBNP CBG: No results for input(s): GLUCAP in the last 168 hours. D-Dimer No results for input(s): DDIMER in the last 72 hours. Hgb A1c No results for input(s): HGBA1C in the  last 72 hours. Lipid Profile No results for input(s): CHOL, HDL, LDLCALC, TRIG, CHOLHDL, LDLDIRECT in the last 72 hours. Thyroid function studies No results for input(s): TSH, T4TOTAL, T3FREE, THYROIDAB in the last 72 hours.  Invalid input(s): FREET3 Anemia work up No results for input(s): VITAMINB12, FOLATE, FERRITIN, TIBC, IRON, RETICCTPCT in the last 72 hours. Urinalysis    Component Value Date/Time   COLORURINE YELLOW 04/30/2016 1244   APPEARANCEUR CLEAR 04/30/2016 1244   LABSPEC 1.030 04/30/2016 1244   PHURINE 6.0 04/30/2016 1244   GLUCOSEU >1000 (A) 04/30/2016 1244   HGBUR NEGATIVE 04/30/2016 1244   BILIRUBINUR NEGATIVE 04/30/2016 1244   BILIRUBINUR neg 08/10/2015 1041   KETONESUR NEGATIVE 04/30/2016 1244   PROTEINUR NEGATIVE 04/30/2016 1244   UROBILINOGEN 2.0 08/10/2015 1041   UROBILINOGEN 0.2 10/23/2014 0817   NITRITE NEGATIVE 04/30/2016 1244   LEUKOCYTESUR NEGATIVE 04/30/2016 1244   Sepsis Labs Invalid input(s): PROCALCITONIN,  WBC,  LACTICIDVEN Microbiology No results found for this or any previous visit (from the past 240 hour(s)).   Time coordinating discharge: Over 30 minutes  SIGNED:   Marinda ElkFELIZ ORTIZ, ABRAHAM, MD  [facility] 05/13/2016, 2:28 PM Pager   If 7PM-7AM, please contact night-coverage  [email or web address] Password [email or web address]

## 2016-05-13 ENCOUNTER — Encounter (HOSPITAL_COMMUNITY): Payer: Self-pay | Admitting: Nurse Practitioner

## 2016-05-13 ENCOUNTER — Emergency Department (HOSPITAL_COMMUNITY): Payer: Self-pay

## 2016-05-13 ENCOUNTER — Emergency Department (HOSPITAL_COMMUNITY)
Admission: EM | Admit: 2016-05-13 | Discharge: 2016-05-13 | Disposition: A | Discharge status: Home / Self Care | Payer: Self-pay | Attending: Emergency Medicine | Admitting: Emergency Medicine

## 2016-05-13 DIAGNOSIS — Z79899 Other long term (current) drug therapy: Secondary | ICD-10-CM | POA: Insufficient documentation

## 2016-05-13 DIAGNOSIS — K047 Periapical abscess without sinus: Secondary | ICD-10-CM | POA: Insufficient documentation

## 2016-05-13 DIAGNOSIS — E1140 Type 2 diabetes mellitus with diabetic neuropathy, unspecified: Secondary | ICD-10-CM | POA: Insufficient documentation

## 2016-05-13 DIAGNOSIS — E1110 Type 2 diabetes mellitus with ketoacidosis without coma: Secondary | ICD-10-CM | POA: Insufficient documentation

## 2016-05-13 DIAGNOSIS — Z794 Long term (current) use of insulin: Secondary | ICD-10-CM | POA: Insufficient documentation

## 2016-05-13 DIAGNOSIS — F17210 Nicotine dependence, cigarettes, uncomplicated: Secondary | ICD-10-CM | POA: Insufficient documentation

## 2016-05-13 LAB — CBC WITH DIFFERENTIAL/PLATELET
BASOS ABS: 0 10*3/uL (ref 0.0–0.1)
BASOS PCT: 0 %
Eosinophils Absolute: 0.2 10*3/uL (ref 0.0–0.7)
Eosinophils Relative: 3 %
HEMATOCRIT: 39.5 % (ref 39.0–52.0)
HEMOGLOBIN: 12.9 g/dL — AB (ref 13.0–17.0)
Lymphocytes Relative: 20 %
Lymphs Abs: 1.5 10*3/uL (ref 0.7–4.0)
MCH: 29 pg (ref 26.0–34.0)
MCHC: 32.7 g/dL (ref 30.0–36.0)
MCV: 88.8 fL (ref 78.0–100.0)
MONOS PCT: 8 %
Monocytes Absolute: 0.6 10*3/uL (ref 0.1–1.0)
NEUTROS ABS: 5.4 10*3/uL (ref 1.7–7.7)
NEUTROS PCT: 69 %
Platelets: 210 10*3/uL (ref 150–400)
RBC: 4.45 MIL/uL (ref 4.22–5.81)
RDW: 13.4 % (ref 11.5–15.5)
WBC: 7.9 10*3/uL (ref 4.0–10.5)

## 2016-05-13 LAB — BASIC METABOLIC PANEL
ANION GAP: 7 (ref 5–15)
BUN: 8 mg/dL (ref 6–20)
CHLORIDE: 105 mmol/L (ref 101–111)
CO2: 26 mmol/L (ref 22–32)
Calcium: 8.8 mg/dL — ABNORMAL LOW (ref 8.9–10.3)
Creatinine, Ser: 0.78 mg/dL (ref 0.61–1.24)
GFR calc non Af Amer: >60 mL/min (ref >60)
Glucose, Bld: 149 mg/dL — ABNORMAL HIGH (ref 65–99)
POTASSIUM: 4.5 mmol/L (ref 3.5–5.1)
Sodium: 138 mmol/L (ref 135–145)

## 2016-05-13 LAB — CBG MONITORING, ED: Glucose-Capillary: 187 mg/dL — ABNORMAL HIGH (ref 65–99)

## 2016-05-13 MED ORDER — IOPAMIDOL (ISOVUE-300) INJECTION 61%
INTRAVENOUS | Status: AC
  Administered 2016-05-13: 75 mL
  Filled 2016-05-13: qty 75

## 2016-05-13 MED ORDER — CLINDAMYCIN HCL 150 MG PO CAPS: 300.0000 mg | ORAL_CAPSULE | Freq: Four times a day (QID) | ORAL | 0 refills | Status: DC

## 2016-05-13 MED ORDER — KETOROLAC TROMETHAMINE 15 MG/ML IJ SOLN
15.0000 mg | Freq: Once | INTRAMUSCULAR | Status: AC
  Administered 2016-05-13: 15 mg via INTRAVENOUS
  Filled 2016-05-13: qty 1

## 2016-05-13 MED ORDER — CLINDAMYCIN PHOSPHATE 600 MG/50ML IV SOLN
600.0000 mg | Freq: Once | INTRAVENOUS | Status: AC
  Administered 2016-05-13: 600 mg via INTRAVENOUS
  Filled 2016-05-13: qty 50

## 2016-05-13 NOTE — Discharge Instructions (Signed)
Motrin 3 times daily for pain Clindamycin 300mg  3 times daily for infection Stop taking Penicillin

## 2016-05-13 NOTE — ED Triage Notes (Addendum)
Pt presents with c/o facial swelling and pain. He noticed the swelling when he woke this morning. He has been taking penicillin since last week for a dental infection. Today the dental pain is worse. He denies trouble swallowing or breathing. He also feels that his hands are swollen

## 2016-05-13 NOTE — ED Notes (Signed)
Pt to CT

## 2016-05-13 NOTE — ED Provider Notes (Signed)
[facility]-EMERGENCY DEPT Provider Note   CSN: [id number] Arrival date & time: 05/13/16  1255     History   Chief Complaint Chief Complaint  Patient presents with  . Oral Swelling    HPI Rickey Smith is a 50 y.o. male.  HPI  The patient is a 50 year old male, he is known to be a poorly controlled diabetic, he also is known to have poor dental disease and was recently seen at a clinic in [place] where he was seen by a dentist, was placed on penicillin because of his poor dentition and the need to remove teeth, he denies that he actually had a dental abscess. He reports that over the last 24 hours she has developed an acute worsening of the swelling of his right jaw, it is located in the right anterior and the right lateral jaw. He denies any fevers, denies swallowing, denies changes in voice or speech or the ability to breathe. He endorses having some swelling in his bilateral upper extremities as well. He has been taking the penicillin as prescribed and has a follow-up appointment in several days for the dental extraction that is required. The symptoms are persistent, not associated with nausea vomiting or diarrhea or any other complaints, no chest pain or shortness of breath.  Past Medical History:  Diagnosis Date  . Diabetes mellitus without complication (HCC)   . Migraine     Patient Active Problem List   Diagnosis Date Noted  . Periodontal disease 05/02/2016  . Tobacco abuse 04/30/2016  . Polysubstance abuse 04/30/2016  . Hyperglycemia 04/30/2016  . ARF (acute renal failure) (HCC) 04/23/2016  . Unresponsive episode 04/23/2016  . DKA, type 2 (HCC) 04/23/2016  . Sepsis (HCC) 08/12/2015  . Prostate abscess 08/11/2015  . Abscess 08/11/2015  . UTI (lower urinary tract infection) 08/11/2015  . Diabetic neuropathy (HCC) 08/10/2015  . Arterial hypotension   . Tachycardia 08/02/2015  . SIRS (systemic inflammatory response syndrome) (HCC) 08/02/2015  . GERD (gastroesophageal  reflux disease) 10/26/2014  . Leukocytosis 10/23/2014  . Hyperkalemia 10/23/2014  . Hyperglycemia without ketosis 07/13/2014  . Diabetic hyperosmolar non-ketotic state (HCC) 07/13/2014  . Diabetes mellitus with nonketotic hyperosmolarity (HCC) 07/13/2014  . DKA (diabetic ketoacidoses) (HCC) 04/26/2014  . Chest pain, ROMI, DDX pericarditis 04/26/2014  . Ex-smoker 04/26/2014  . Hyponatremia 04/26/2014  . Metabolic acidosis 04/26/2014    Past Surgical History:  Procedure Laterality Date  . TRANSURETHRAL RESECTION OF PROSTATE N/A 08/16/2015   Procedure: TRANSURETHRAL RESECTION DRAINAGE OF PROSTATE ABCESS;  Surgeon: Barron Alvineavid Grapey, MD;  Location: [facility];  Service: Urology;  Laterality: N/A;       Home Medications    Prior to Admission medications   Medication Sig Start Date End Date Taking? Authorizing Provider  amitriptyline (ELAVIL) 75 MG tablet Take 1 tablet (75 mg total) by mouth at bedtime. Patient not taking: Reported on 04/30/2016 08/10/15   Jaclyn ShaggyEnobong Amao, MD  atorvastatin (LIPITOR) 40 MG tablet Take 1 tablet (40 mg total) by mouth daily at 6 PM. Patient not taking: Reported on 04/30/2016 10/26/14   Henderson CloudEstela Y Hernandez Acosta, MD  famotidine (PEPCID) 20 MG tablet Take 1 tablet (20 mg total) by mouth 2 (two) times daily. Patient not taking: Reported on 04/30/2016 03/14/16 06/12/16  Alm BustardMatthew O'Sullivan, MD  insulin aspart protamine- aspart (NOVOLOG MIX 70/30) (70-30) 100 UNIT/ML injection Inject 0.4 mLs (40 Units total) into the skin 2 (two) times daily with a meal. 04/24/16   Osvaldo ShipperGokul Krishnan, MD  lisinopril (PRINIVIL,ZESTRIL) 20 MG  tablet Take 1 tablet (20 mg total) by mouth daily. Patient not taking: Reported on 04/30/2016 03/15/16 07/13/16  Alm Bustard, MD  ondansetron (ZOFRAN) 4 MG tablet Take 1 tablet (4 mg total) by mouth every 6 (six) hours. Patient not taking: Reported on 04/30/2016 11/28/15   Jacalyn Lefevre, MD  pregabalin (LYRICA) 100 MG capsule Take 1 capsule (100 mg total) by mouth  2 (two) times daily. Patient not taking: Reported on 04/30/2016 09/13/15   Pete Glatter, MD    Family History Family History  Problem Relation Age of Onset  . Heart attack    . Prostate cancer    . Hypertension    . Hypertension Mother   . Cancer Father     Social History Social History  Substance Use Topics  . Smoking status: Current Some Day Smoker    Packs/day: 0.50    Years: 20.00    Types: Cigarettes  . Smokeless tobacco: Never Used  . Alcohol use No     Allergies   Codeine and Gabapentin   Review of Systems Review of Systems  All other systems reviewed and are negative.    Physical Exam Updated Vital Signs BP 156/95 (BP Location: Left Arm)   Pulse 97   Temp 98.3 F (36.8 C) (Oral)   Resp 19   SpO2 96%   Physical Exam  Constitutional: He appears well-developed and well-nourished. No distress.  HENT:  Head: Normocephalic and atraumatic.  Mouth/Throat: Oropharynx is clear and moist. No oropharyngeal exudate.  There is poor dentition however his uvula is midline, the tonsillar pillars appear normal, there is no swelling or asymmetry, phonation is normal, there are multiple missing and severely degraded teeth, there is no tenderness under the tongue, he has normal tongue protrusion, he has asymmetry of the jaw with right anterior lower jaw tenderness and swelling along the gumline on the buccal surface. There is no palpable fluctuant abscess. He has no trismus or torticollis  Eyes: Conjunctivae and EOM are normal. Pupils are equal, round, and reactive to light. Right eye exhibits no discharge. Left eye exhibits no discharge. No scleral icterus.  Neck: Normal range of motion. Neck supple. No JVD present. No thyromegaly present.  Very supple neck, no lymphadenopathy  Cardiovascular: Normal rate, regular rhythm, normal heart sounds and intact distal pulses.  Exam reveals no gallop and no friction rub.   No murmur heard. Pulmonary/Chest: Effort normal and breath  sounds normal. No respiratory distress. He has no wheezes. He has no rales.  Abdominal: Soft. Bowel sounds are normal. He exhibits no distension and no mass. There is no tenderness.  Musculoskeletal: Normal range of motion. He exhibits no edema or tenderness.  The patient complains of swelling of his upper extremities however I do not see any asymmetry or obvious edema of his arms or legs. He has normal range of motion of all major joints of the upper extremities, he is able to make normal grips with normal strength  Lymphadenopathy:    He has no cervical adenopathy.  Neurological: He is alert. Coordination normal.  Skin: Skin is warm and dry. No rash noted. No erythema.  Psychiatric: He has a normal mood and affect. His behavior is normal.  Nursing note and vitals reviewed.    ED Treatments / Results  Labs (all labs ordered are listed, but only abnormal results are displayed) Labs Reviewed  CBC WITH DIFFERENTIAL/PLATELET  BASIC METABOLIC PANEL  CBG MONITORING, ED    Radiology No results found.  Procedures  Procedures (including critical care time)  Medications Ordered in ED Medications  clindamycin (CLEOCIN) IVPB 600 mg (not administered)     Initial Impression / Assessment and Plan / ED Course  I have reviewed the triage vital signs and the nursing notes.  Pertinent labs & imaging results that were available during my care of the patient were reviewed by me and considered in my medical decision making (see chart for details).  Clinical Course     The patient's blood sugar was checked and it was 187, he does not appear to be septic, he does appear to have what appears to be a dental abscess. I do not think that this is an allergic reaction to the medications but rather an infection of his gums and jaw on that right side. He will need a CT scan to further evaluate the source and extent of this infection though I suspect it is a periodontal abscess. At this time the patient  will get IV clindamycin, labs to rule out worsening complications of kidney function or hyperglycemia.  Labs unremarkable CT shows cellulitis with tiny subperiosteal abscess -  Has f/u with dentist clinda given prior to d/c  Final Clinical Impressions(s) / ED Diagnoses   Final diagnoses:  Dental abscess    New Prescriptions New Prescriptions   CLINDAMYCIN (CLEOCIN) 150 MG CAPSULE    Take 2 capsules (300 mg total) by mouth every 6 (six) hours.     Eber HongBrian Meriam Chojnowski, MD 05/13/16 [phone]

## 2016-06-27 ENCOUNTER — Encounter (HOSPITAL_COMMUNITY): Payer: Self-pay | Admitting: Emergency Medicine

## 2016-06-27 ENCOUNTER — Inpatient Hospital Stay (HOSPITAL_COMMUNITY)
Admission: EM | Admit: 2016-06-27 | Discharge: 2016-06-29 | DRG: 638 | Disposition: A | Discharge status: Home / Self Care | Payer: Self-pay | Attending: Internal Medicine | Admitting: Internal Medicine

## 2016-06-27 DIAGNOSIS — Z8042 Family history of malignant neoplasm of prostate: Secondary | ICD-10-CM

## 2016-06-27 DIAGNOSIS — F1410 Cocaine abuse, uncomplicated: Secondary | ICD-10-CM | POA: Diagnosis present

## 2016-06-27 DIAGNOSIS — Z87891 Personal history of nicotine dependence: Secondary | ICD-10-CM

## 2016-06-27 DIAGNOSIS — E1065 Type 1 diabetes mellitus with hyperglycemia: Principal | ICD-10-CM | POA: Diagnosis present

## 2016-06-27 DIAGNOSIS — R7989 Other specified abnormal findings of blood chemistry: Secondary | ICD-10-CM | POA: Diagnosis present

## 2016-06-27 DIAGNOSIS — E1140 Type 2 diabetes mellitus with diabetic neuropathy, unspecified: Secondary | ICD-10-CM | POA: Diagnosis present

## 2016-06-27 DIAGNOSIS — K219 Gastro-esophageal reflux disease without esophagitis: Secondary | ICD-10-CM | POA: Diagnosis present

## 2016-06-27 DIAGNOSIS — Z8249 Family history of ischemic heart disease and other diseases of the circulatory system: Secondary | ICD-10-CM

## 2016-06-27 DIAGNOSIS — Z794 Long term (current) use of insulin: Secondary | ICD-10-CM

## 2016-06-27 DIAGNOSIS — R739 Hyperglycemia, unspecified: Secondary | ICD-10-CM | POA: Diagnosis present

## 2016-06-27 DIAGNOSIS — E1040 Type 1 diabetes mellitus with diabetic neuropathy, unspecified: Secondary | ICD-10-CM | POA: Diagnosis present

## 2016-06-27 DIAGNOSIS — Z9119 Patient's noncompliance with other medical treatment and regimen: Secondary | ICD-10-CM

## 2016-06-27 DIAGNOSIS — E871 Hypo-osmolality and hyponatremia: Secondary | ICD-10-CM | POA: Diagnosis present

## 2016-06-27 DIAGNOSIS — F17210 Nicotine dependence, cigarettes, uncomplicated: Secondary | ICD-10-CM | POA: Diagnosis present

## 2016-06-27 DIAGNOSIS — E0800 Diabetes mellitus due to underlying condition with hyperosmolarity without nonketotic hyperglycemic-hyperosmolar coma (NKHHC): Secondary | ICD-10-CM | POA: Diagnosis present

## 2016-06-27 LAB — BASIC METABOLIC PANEL
ANION GAP: 9 (ref 5–15)
BUN: 31 mg/dL — ABNORMAL HIGH (ref 6–20)
CHLORIDE: 84 mmol/L — AB (ref 101–111)
CO2: 24 mmol/L (ref 22–32)
CREATININE: 1.19 mg/dL (ref 0.61–1.24)
Calcium: 8.4 mg/dL — ABNORMAL LOW (ref 8.9–10.3)
GFR calc non Af Amer: >60 mL/min (ref >60)
GLUCOSE: 1058 mg/dL — AB (ref 65–99)
Potassium: 5 mmol/L (ref 3.5–5.1)
Sodium: 117 mmol/L — CL (ref 135–145)

## 2016-06-27 LAB — CBC
HCT: 35.8 % — ABNORMAL LOW (ref 39.0–52.0)
HEMOGLOBIN: 12.9 g/dL — AB (ref 13.0–17.0)
MCH: 29.2 pg (ref 26.0–34.0)
MCHC: 36 g/dL (ref 30.0–36.0)
MCV: 81 fL (ref 78.0–100.0)
Platelets: 241 10*3/uL (ref 150–400)
RBC: 4.42 MIL/uL (ref 4.22–5.81)
WBC: 7.8 10*3/uL (ref 4.0–10.5)

## 2016-06-27 LAB — URINALYSIS, ROUTINE W REFLEX MICROSCOPIC
Bacteria, UA: NONE SEEN
Bilirubin Urine: NEGATIVE
Glucose, UA: >=500 mg/dL — AB
Hgb urine dipstick: NEGATIVE
Ketones, ur: 5 mg/dL — AB
Leukocytes, UA: NEGATIVE
Nitrite: NEGATIVE
PROTEIN: NEGATIVE mg/dL
RBC / HPF: NONE SEEN RBC/hpf (ref 0–5)
SQUAMOUS EPITHELIAL / LPF: NONE SEEN
Specific Gravity, Urine: 1.02 (ref 1.005–1.030)
pH: 7 (ref 5.0–8.0)

## 2016-06-27 LAB — CBG MONITORING, ED: Glucose-Capillary: >600 mg/dL (ref 65–99)

## 2016-06-27 MED ORDER — SODIUM CHLORIDE 0.9 % IV SOLN
INTRAVENOUS | Status: DC
  Administered 2016-06-27: 5.4 [IU]/h via INTRAVENOUS
  Filled 2016-06-27: qty 2.5

## 2016-06-27 MED ORDER — SODIUM CHLORIDE 0.9% FLUSH
3.0000 mL | Freq: Two times a day (BID) | INTRAVENOUS | Status: DC
  Administered 2016-06-28 – 2016-06-29 (×2): 3 mL via INTRAVENOUS

## 2016-06-27 MED ORDER — SODIUM CHLORIDE 0.9 % IV SOLN
INTRAVENOUS | Status: DC
  Administered 2016-06-27: via INTRAVENOUS

## 2016-06-27 MED ORDER — ENOXAPARIN SODIUM 40 MG/0.4ML ~~LOC~~ SOLN
40.0000 mg | SUBCUTANEOUS | Status: DC
  Administered 2016-06-28 – 2016-06-29 (×2): 40 mg via SUBCUTANEOUS
  Filled 2016-06-27 (×2): qty 0.4

## 2016-06-27 MED ORDER — DEXTROSE 50 % IV SOLN: 25.0000 mL | INTRAVENOUS | Status: DC | PRN

## 2016-06-27 MED ORDER — DEXTROSE-NACL 5-0.45 % IV SOLN
INTRAVENOUS | Status: DC
  Administered 2016-06-28: 02:00:00 via INTRAVENOUS

## 2016-06-27 MED ORDER — METOCLOPRAMIDE HCL 5 MG/ML IJ SOLN
10.0000 mg | Freq: Once | INTRAMUSCULAR | Status: AC
  Administered 2016-06-27: 10 mg via INTRAVENOUS
  Filled 2016-06-27: qty 2

## 2016-06-27 MED ORDER — DIPHENHYDRAMINE HCL 50 MG/ML IJ SOLN
12.5000 mg | Freq: Once | INTRAMUSCULAR | Status: AC
  Administered 2016-06-27: via INTRAVENOUS
  Filled 2016-06-27: qty 1

## 2016-06-27 MED ORDER — ONDANSETRON HCL 4 MG PO TABS: 4.0000 mg | ORAL_TABLET | Freq: Four times a day (QID) | ORAL | Status: DC | PRN

## 2016-06-27 MED ORDER — ONDANSETRON HCL 4 MG/2ML IJ SOLN: 4.0000 mg | Freq: Four times a day (QID) | INTRAMUSCULAR | Status: DC | PRN

## 2016-06-27 NOTE — ED Provider Notes (Signed)
Presents with diarrhea for the past 3 days. Denies abdominal pain. Denies blood per rectum. Also complains of generalized weakness since this morning and polydipsia and polyuria. He denies noncompliance with his medications. Patient is alert nontoxic-appearing and appears in no distress presently   Doug SouSam Travian Kerner, MD 06/27/16 2340

## 2016-06-27 NOTE — ED Provider Notes (Signed)
[facility]-EMERGENCY DEPT Provider Note   CSN: [id number] Arrival date & time: 06/27/16  2103  By signing my name below, I, Rickey Smith, attest that this documentation has been prepared under the direction and in the presence of Earley Favor, NP-C. Electronically Signed: Orpah Smith , ED Scribe. 06/27/16. 11:23 PM.   History   Chief Complaint Chief Complaint  Patient presents with  . Hyperglycemia    HPI  HPI Comments: Rickey Smith is a 51 y.o. male with hx of Type 2 diabetes mellitus (2007)  who presents to the Emergency Department [facility] complaining of hyperglycemia with sudden onset x3 hours. Pt states that his glucose has been normal until this evening. He took his insulin at [facility], ate at 6:30PM and started to feel ill at 8PM. Pt reports associated nausea, vomiting, thirst, frequency and diarrhea x2 days. He states that the diarrhea has eased up in the past few hours.    The history is provided by the patient. No language interpreter was used.    Past Medical History:  Diagnosis Date  . Diabetes mellitus without complication (HCC)   . Migraine     Patient Active Problem List   Diagnosis Date Noted  . Periodontal disease 05/02/2016  . Tobacco abuse 04/30/2016  . Polysubstance abuse 04/30/2016  . Hyperglycemia 04/30/2016  . ARF (acute renal failure) (HCC) 04/23/2016  . Unresponsive episode 04/23/2016  . DKA, type 2 (HCC) 04/23/2016  . Sepsis (HCC) 08/12/2015  . Prostate abscess 08/11/2015  . Abscess 08/11/2015  . UTI (lower urinary tract infection) 08/11/2015  . Diabetic neuropathy (HCC) 08/10/2015  . Arterial hypotension   . Tachycardia 08/02/2015  . SIRS (systemic inflammatory response syndrome) (HCC) 08/02/2015  . GERD (gastroesophageal reflux disease) 10/26/2014  . Leukocytosis 10/23/2014  . Hyperkalemia 10/23/2014  . Hyperglycemia without ketosis 07/13/2014  . Diabetic hyperosmolar non-ketotic state (HCC) 07/13/2014  . Diabetes mellitus with nonketotic  hyperosmolarity (HCC) 07/13/2014  . DKA (diabetic ketoacidoses) (HCC) 04/26/2014  . Chest pain, ROMI, DDX pericarditis 04/26/2014  . Ex-smoker 04/26/2014  . Hyponatremia 04/26/2014  . Metabolic acidosis 04/26/2014    Past Surgical History:  Procedure Laterality Date  . TRANSURETHRAL RESECTION OF PROSTATE N/A 08/16/2015   Procedure: TRANSURETHRAL RESECTION DRAINAGE OF PROSTATE ABCESS;  Surgeon: Barron Alvine, MD;  Location: [facility];  Service: Urology;  Laterality: N/A;       Home Medications    Prior to Admission medications   Medication Sig Start Date End Date Taking? Authorizing Provider  insulin aspart protamine- aspart (NOVOLOG MIX 70/30) (70-30) 100 UNIT/ML injection Inject 0.4 mLs (40 Units total) into the skin 2 (two) times daily with a meal. 04/24/16  Yes Osvaldo Shipper, MD  amitriptyline (ELAVIL) 75 MG tablet Take 1 tablet (75 mg total) by mouth at bedtime. Patient not taking: Reported on 06/27/2016 08/10/15   Jaclyn Shaggy, MD  atorvastatin (LIPITOR) 40 MG tablet Take 1 tablet (40 mg total) by mouth daily at 6 PM. Patient not taking: Reported on 06/27/2016 10/26/14   Henderson Cloud, MD  clindamycin (CLEOCIN) 150 MG capsule Take 2 capsules (300 mg total) by mouth every 6 (six) hours. Patient not taking: Reported on 06/27/2016 05/13/16   Eber Hong, MD  famotidine (PEPCID) 20 MG tablet Take 1 tablet (20 mg total) by mouth 2 (two) times daily. Patient not taking: Reported on 04/30/2016 03/14/16 06/12/16  Alm Bustard, MD  lisinopril (PRINIVIL,ZESTRIL) 20 MG tablet Take 1 tablet (20 mg total) by mouth daily. Patient not taking: Reported on  06/27/2016 03/15/16 07/13/16  Alm Bustard, MD  ondansetron (ZOFRAN) 4 MG tablet Take 1 tablet (4 mg total) by mouth every 6 (six) hours. Patient not taking: Reported on 06/27/2016 11/28/15   Jacalyn Lefevre, MD  pregabalin (LYRICA) 100 MG capsule Take 1 capsule (100 mg total) by mouth 2 (two) times daily. Patient not taking: Reported on  06/27/2016 09/13/15   Pete Glatter, MD    Family History Family History  Problem Relation Age of Onset  . Heart attack    . Prostate cancer    . Hypertension    . Hypertension Mother   . Cancer Father     Social History Social History  Substance Use Topics  . Smoking status: Current Some Day Smoker    Packs/day: 0.50    Years: 20.00    Types: Cigarettes  . Smokeless tobacco: Never Used  . Alcohol use No     Allergies   Codeine and Gabapentin   Review of Systems Review of Systems  Constitutional: Negative for chills and fever.  HENT: Negative for ear pain and sore throat.   Eyes: Negative for pain and visual disturbance.  Respiratory: Negative for cough and shortness of breath.   Cardiovascular: Negative for chest pain and palpitations.  Gastrointestinal: Positive for diarrhea, nausea and vomiting. Negative for abdominal pain.  Genitourinary: Positive for frequency. Negative for dysuria and hematuria.  Musculoskeletal: Negative for arthralgias and back pain.  Skin: Negative for color change and rash.  Neurological: Negative for seizures and syncope.  All other systems reviewed and are negative.    Physical Exam Updated Vital Signs BP 142/82 (BP Location: Right Arm)   Pulse 101   Temp 97.9 F (36.6 C) (Oral)   Resp 22   Ht 6\' 1"  (1.854 m)   Wt 77.1 kg   SpO2 93%   BMI 22.43 kg/m   Physical Exam  Constitutional: He appears well-developed and well-nourished.  HENT:  Head: Normocephalic and atraumatic.  Eyes: Conjunctivae are normal.  Neck: Neck supple.  Cardiovascular: Tachycardia present.   No murmur heard. Pulmonary/Chest: Effort normal and breath sounds normal. No respiratory distress.  Abdominal: Soft. Bowel sounds are normal. He exhibits no distension. There is no tenderness.  Musculoskeletal: He exhibits no edema.  Neurological: He is alert.  Skin: Skin is warm and dry.  Psychiatric: He has a normal mood and affect.  Nursing note and vitals  reviewed.    ED Treatments / Results   DIAGNOSTIC STUDIES: Oxygen Saturation is 93% on RA, inadequate by my interpretation.   COORDINATION OF CARE: 11:23 PM-Discussed next steps with pt. Pt verbalized understanding and is agreeable with the plan.    Labs (all labs ordered are listed, but only abnormal results are displayed) Labs Reviewed  BASIC METABOLIC PANEL - Abnormal; Notable for the following:       Result Value   Sodium 117 (*)    Chloride 84 (*)    Glucose, Bld 1,058 (*)    BUN 31 (*)    Calcium 8.4 (*)    All other components within normal limits  CBC - Abnormal; Notable for the following:    Hemoglobin 12.9 (*)    HCT 35.8 (*)    All other components within normal limits  URINALYSIS, ROUTINE W REFLEX MICROSCOPIC - Abnormal; Notable for the following:    Color, Urine COLORLESS (*)    Glucose, UA >=500 (*)    Ketones, ur 5 (*)    All other components within normal limits  CBG MONITORING, ED - Abnormal; Notable for the following:    Glucose-Capillary >600 (*)    All other components within normal limits    EKG  EKG Interpretation None       Radiology No results found.  Procedures Procedures (including critical care time)  Medications Ordered in ED Medications  dextrose 5 %-0.45 % sodium chloride infusion ( Intravenous Hold 06/27/16 2251)  insulin regular (NOVOLIN R,HUMULIN R) 250 Units in sodium chloride 0.9 % 250 mL (1 Units/mL) infusion (5.4 Units/hr Intravenous New Bag/Given 06/27/16 2308)  dextrose 50 % solution 25 mL (not administered)  0.9 %  sodium chloride infusion (not administered)  metoCLOPramide (REGLAN) injection 10 mg (not administered)  diphenhydrAMINE (BENADRYL) injection 12.5 mg (not administered)     Initial Impression / Assessment and Plan / ED Course  I have reviewed the triage vital signs and the nursing notes.  Pertinent labs & imaging results that were available during my care of the patient were reviewed by me and considered  in my medical decision making (see chart for details).  Clinical Course      Will start Glucomander, will hydrate   Final Clinical Impressions(s) / ED Diagnoses   Final diagnoses:  Hyperglycemia    New Prescriptions New Prescriptions   No medications on file  I personally performed the services described in this documentation, which was scribed in my presence. The recorded information has been reviewed and is accurate.    Earley FavorGail Athalie Newhard, NP 06/27/16 2313    Earley FavorGail Anterio Scheel, NP 06/27/16 [id number]    Doug SouSam Jacubowitz, MD 06/28/16 [id number]

## 2016-06-27 NOTE — H&P (Signed)
[facility] History and Physical  Rickey Smith ZOX:[id number] DOB: Oct 17, 1965 DOA: 06/27/2016  Referring physician: ED PCP: No PCP Per Patient , per pt went to [facility] in past  (I last saw in 09/13/15 in clinic)  Chief Complaint: diarrhea and hyperglycemia  HPI: Rickey Smith is a 51 y.o. male medical history significant for poorly controlled diabetes mellitus type 1 secondary to with secondary neuropathy, polysubstance abuse, frequent hospitalizations and ER visits per notes.  He denies alcohol, tobacco or current drug use to me. He notes about a month ago he had a tooth abscess, infection requiring antibiotics about 2 weeks ago. Since then, he was getting over a cough. About 2 days ago, started having severe diarrhea, finally improving in last few hours. Blood sugars today around dinner time per pt was 98, what and he just took his insulin regularly. He is on NPH 70/30, 40 units 2 times a day and sliding scale insulin. Of note, labs to the ED had no anion gap was 9, however, see severe hyperglycemia felt nonketotic hyperglycemia range.  Of note, patient [facility] actually did last see him in clinic back in 09/13/2015 and had not seen him since.  Hecurrently denies shortness of breath, chest pain, palpitations, no diarrhea  Currently, denies any nausea or vomiting. Denies any abdominal pains, dysuria, hematuria, hematochezia.    Not homeless, lives alone, but goes to his mother's sometimes as well.  Review of Systems:  Per history of present illness, otherwise all systems reviewed, essentially unremarkable, except for above  Past Medical History:  Diagnosis Date  . Diabetes mellitus without complication (HCC)   . Migraine    Past Surgical History:  Procedure Laterality Date  . TRANSURETHRAL RESECTION OF PROSTATE N/A 08/16/2015   Procedure: TRANSURETHRAL RESECTION DRAINAGE OF PROSTATE ABCESS;  Surgeon: Barron Alvine, MD;  Location: [facility]  [facility];  Service: Urology;  Laterality: N/A;   Social History:  reports that he has been smoking Cigarettes.  He has a 10.00 pack-year smoking history. He has never used smokeless tobacco. He reports that he uses drugs, including Cocaine and Other-see comments. He reports that he does not drink alcohol.  Allergies  Allergen Reactions  . Codeine Itching  . Gabapentin Diarrhea and Nausea And Vomiting    Family History  Problem Relation Age of Onset  . Heart attack    . Prostate cancer    . Hypertension    . Hypertension Mother   . Cancer Father      Prior to Admission medications   Medication Sig Start Date End Date Taking? Authorizing Provider  insulin aspart protamine- aspart (NOVOLOG MIX 70/30) (70-30) 100 UNIT/ML injection Inject 0.4 mLs (40 Units total) into the skin 2 (two) times daily with a meal. 04/24/16  Yes Osvaldo Shipper, MD  amitriptyline (ELAVIL) 75 MG tablet Take 1 tablet (75 mg total) by mouth at bedtime. Patient not taking: Reported on 06/27/2016 08/10/15   Jaclyn Shaggy, MD  atorvastatin (LIPITOR) 40 MG tablet Take 1 tablet (40 mg total) by mouth daily at 6 PM. Patient not taking: Reported on 06/27/2016 10/26/14   Henderson Cloud, MD  clindamycin (CLEOCIN) 150 MG capsule Take 2 capsules (300 mg total) by mouth every 6 (six) hours. Patient not taking: Reported on 06/27/2016 05/13/16   Eber Hong, MD  famotidine (PEPCID) 20 MG tablet Take 1 tablet (20 mg total) by mouth 2 (two) times daily. Patient not taking: Reported on 04/30/2016 03/14/16  06/12/16  Alm Bustard, MD  lisinopril (PRINIVIL,ZESTRIL) 20 MG tablet Take 1 tablet (20 mg total) by mouth daily. Patient not taking: Reported on 06/27/2016 03/15/16 07/13/16  Alm Bustard, MD  ondansetron (ZOFRAN) 4 MG tablet Take 1 tablet (4 mg total) by mouth every 6 (six) hours. Patient not taking: Reported on 06/27/2016 11/28/15   Jacalyn Lefevre, MD  pregabalin (LYRICA) 100 MG capsule Take 1 capsule (100 mg total) by mouth 2  (two) times daily. Patient not taking: Reported on 06/27/2016 09/13/15   Pete Glatter, MD   Physical Exam: Vitals:   06/27/16 2114 06/27/16 2338  BP: 142/82 101/82  Pulse: 101 95  Resp: 22 18  Temp: 97.9 F (36.6 C)   TempSrc: Oral   SpO2: 93% 96%  Weight: 77.1 kg (170 lb)   Height: 6\' 1"  (1.854 m)     Wt Readings from Last 3 Encounters:  06/27/16 77.1 kg (170 lb)  05/01/16 64.1 kg (141 lb 5 oz)  04/23/16 65.8 kg (145 lb)    General:  Appears calm and comfortable,  Thin appearing male, pleasant, NAD, AAOx3 Eyes: PERRL, normal lids, irises & conjunctiva ENT: grossly normal hearing, lips & tongue, dry mmm Neck: no LAD, no jvd Cardiovascular: RRR, no m/r/g. No LE edema. Telemetry: SR, no arrhythmias  Respiratory: CTA bilaterally, no w/r/r. Normal respiratory effort. Abdomen: soft, ntnd, no g/r Skin: no rash or induration seen on limited exam, numerous body tattoos, Musculoskeletal: grossly normal tone BUE/BLE Psychiatric: grossly normal mood and affect, speech fluent and appropriate Neurologic: grossly non-focal.  smooth skin bilat led c/w dm neuropathy and decrease sensation           Labs on Admission:  Basic Metabolic Panel:  Recent Labs Lab 06/27/16 2150  NA 117*  K 5.0  CL 84*  CO2 24  GLUCOSE 1,058*  BUN 31*  CREATININE 1.19  CALCIUM 8.4*   Liver Function Tests: No results for input(s): AST, ALT, ALKPHOS, BILITOT, PROT, ALBUMIN in the last 168 hours. No results for input(s): LIPASE, AMYLASE in the last 168 hours. No results for input(s): AMMONIA in the last 168 hours. CBC:  Recent Labs Lab 06/27/16 2150  WBC 7.8  HGB 12.9*  HCT 35.8*  MCV 81.0  PLT 241   Cardiac Enzymes: No results for input(s): CKTOTAL, CKMB, CKMBINDEX, TROPONINI in the last 168 hours.  BNP (last 3 results)  Recent Labs  08/02/15 1306  BNP 30.1    ProBNP (last 3 results) No results for input(s): PROBNP in the last 8760 hours.  CBG:  Recent Labs Lab  06/27/16 2118  GLUCAP >600*    Radiological Exams on Admission: No results found.  EKG: none done  EKG Interpretation  Date/Time:    Ventricular Rate:    PR Interval:    QRS Duration:   QT Interval:    QTC Calculation:   R Axis:     Text Interpretation:          Assessment/Plan Principal Problem:   Diabetes mellitus due to underlying condition with hyperosmolarity without nonketotic hyperglycemic-hyperosmolar coma [facility]) (HCC) Active Problems:   Hyponatremia   Ex-smoker   Diabetic neuropathy (HCC)   Hyperglycemia   1. Uncontrolled diabetes type 1  with hyperosmolarity, nonketotic hyperglycemic with hyponatremia and neuropathy - History of numerous admissions in the past has not followed up with primary care doctor since March of this past year - No signs of acute infection, although he did state he had diarrhea. He does not have  a leukocytosis to warrant checking C. difficile at this time and his diarrhea has stopped - inpt stepdown, on glucometer / insulin gtt/ ivf per protocol - npo for now, ice chips ok - bmp at mn  2. Hyponatremia, suspect pseudohypsecondary  Due to Hyperglycemia - corrected na 140. - IV fluids as ordered. An insulin drip ordered   3. Polysubstance abuse - Patient has a history of polysubstance abuse with history of cocaine use as well. He denies all drug use to me currently and says he hasn't drank or smoked in a long time, which check a urine drug screen  4. Azotemia - ivf.  5. Noncompliance - [facility], and have not seen him since - gave him my card to f/u after dc.  Code Status:  full DVT Prophylaxis:  lovenox 40 Family Communication: pt, no family at bedside Disposition Plan: 1-2 days  Time spent: 40mins  Pete Glatterawn T Fredick Schlosser MD., MBA/MHA [facility] Pager [phone]
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
## 2016-06-27 NOTE — ED Notes (Signed)
Bed: [id number] Expected date:  Expected time:  Means of arrival:  Comments: 51 yr old, dizziness, hyperglycemia

## 2016-06-27 NOTE — ED Notes (Signed)
RN and NP made aware of lab critical values of NA and glucose

## 2016-06-28 LAB — CBG MONITORING, ED
Glucose-Capillary: 320 mg/dL — ABNORMAL HIGH (ref 65–99)
Glucose-Capillary: 589 mg/dL (ref 65–99)

## 2016-06-28 LAB — GLUCOSE, CAPILLARY
GLUCOSE-CAPILLARY: 167 mg/dL — AB (ref 65–99)
GLUCOSE-CAPILLARY: 106 mg/dL — AB (ref 65–99)
GLUCOSE-CAPILLARY: 114 mg/dL — AB (ref 65–99)
GLUCOSE-CAPILLARY: 382 mg/dL — AB (ref 65–99)
Glucose-Capillary: 86 mg/dL (ref 65–99)
Glucose-Capillary: 87 mg/dL (ref 65–99)
Glucose-Capillary: 164 mg/dL — ABNORMAL HIGH (ref 65–99)
Glucose-Capillary: 69 mg/dL (ref 65–99)
Glucose-Capillary: 231 mg/dL — ABNORMAL HIGH (ref 65–99)

## 2016-06-28 LAB — CBC
HEMATOCRIT: 38.8 % — AB (ref 39.0–52.0)
Hemoglobin: 13.8 g/dL (ref 13.0–17.0)
MCH: 29.2 pg (ref 26.0–34.0)
MCHC: 35.6 g/dL (ref 30.0–36.0)
MCV: 82.2 fL (ref 78.0–100.0)
PLATELETS: 292 10*3/uL (ref 150–400)
RBC: 4.72 MIL/uL (ref 4.22–5.81)
RDW: 12.6 % (ref 11.5–15.5)
WBC: 9.8 10*3/uL (ref 4.0–10.5)

## 2016-06-28 LAB — COMPREHENSIVE METABOLIC PANEL
ALT: 25 U/L (ref 17–63)
AST: 26 U/L (ref 15–41)
Albumin: 3.8 g/dL (ref 3.5–5.0)
Alkaline Phosphatase: 86 U/L (ref 38–126)
Anion gap: 11 (ref 5–15)
BILIRUBIN TOTAL: 0.5 mg/dL (ref 0.3–1.2)
BUN: 21 mg/dL — AB (ref 6–20)
CO2: 25 mmol/L (ref 22–32)
Calcium: 9.4 mg/dL (ref 8.9–10.3)
Chloride: 103 mmol/L (ref 101–111)
Creatinine, Ser: 0.77 mg/dL (ref 0.61–1.24)
Glucose, Bld: 101 mg/dL — ABNORMAL HIGH (ref 65–99)
POTASSIUM: 3.6 mmol/L (ref 3.5–5.1)
Sodium: 139 mmol/L (ref 135–145)
TOTAL PROTEIN: 7 g/dL (ref 6.5–8.1)

## 2016-06-28 LAB — MRSA PCR SCREENING: MRSA BY PCR: NEGATIVE

## 2016-06-28 LAB — RAPID URINE DRUG SCREEN, HOSP PERFORMED
AMPHETAMINES: NOT DETECTED
Barbiturates: NOT DETECTED
Benzodiazepines: NOT DETECTED
Cocaine: POSITIVE — AB
OPIATES: NOT DETECTED
Tetrahydrocannabinol: NOT DETECTED

## 2016-06-28 MED ORDER — INSULIN ASPART PROT & ASPART (70-30 MIX) 100 UNIT/ML ~~LOC~~ SUSP
20.0000 [IU] | Freq: Two times a day (BID) | SUBCUTANEOUS | Status: DC
  Filled 2016-06-28: qty 10

## 2016-06-28 MED ORDER — INSULIN ASPART 100 UNIT/ML ~~LOC~~ SOLN
0.0000 [IU] | Freq: Three times a day (TID) | SUBCUTANEOUS | Status: DC
  Administered 2016-06-28: 2 [IU] via SUBCUTANEOUS
  Administered 2016-06-28: 9 [IU] via SUBCUTANEOUS
  Administered 2016-06-29 (×2): 3 [IU] via SUBCUTANEOUS

## 2016-06-28 MED ORDER — INSULIN ASPART PROT & ASPART (70-30 MIX) 100 UNIT/ML ~~LOC~~ SUSP
40.0000 [IU] | Freq: Two times a day (BID) | SUBCUTANEOUS | Status: DC
  Administered 2016-06-28 – 2016-06-29 (×2): 40 [IU] via SUBCUTANEOUS
  Filled 2016-06-28: qty 10

## 2016-06-28 MED ORDER — SODIUM CHLORIDE 0.9 % IV BOLUS (SEPSIS)
500.0000 mL | Freq: Once | INTRAVENOUS | Status: AC
  Administered 2016-06-28: 500 mL via INTRAVENOUS

## 2016-06-28 NOTE — Progress Notes (Signed)
Pt had CBG of 69. Food was given to pt. Repeat CBG was 231. Notified M. Lynch NP. She stated to monitor CBG.

## 2016-06-28 NOTE — Progress Notes (Signed)
CBG monitor reads patient glucose level 87. Patient states sugar level is low for him. Patient seems lethargic upon assessment. Patient received orange juice with a graham cracker and peanut butter. Patient states he is feeling better.  Will continue to monitor patient. Milon DikesKristina D Amberle Lyter, RN

## 2016-06-28 NOTE — Progress Notes (Signed)
PROGRESS NOTE    Rickey Smith  ZOX:[id number] DOB: 13-Dec-1965 DOA: 06/27/2016 PCP: No PCP Per Patient   Brief Narrative:  Patient admitted after being to have found elevated Bs. He was in [facility]. Improved overnight with the insulin drip. Will transition over to home regimen.    Assessment & Plan:   Principal Problem:   Diabetes mellitus due to underlying condition with hyperosmolarity without nonketotic hyperglycemic-hyperosmolar coma [facility]) (HCC) Active Problems:   Ex-smoker   Hyponatremia   Diabetic neuropathy (HCC)   Hyperglycemia  1.Uncontrolled diabetes type 1  with hyperosmolarity, nonketotic hyperglycemic with hyponatremia and neuropathy - History of numerous admissions in the past has not followed up with primary care doctor since March of this past year - No signs of acute infection, although he did state he had diarrhea. He does not have a leukocytosis to warrant checking C. difficile at this time and his diarrhea has stopped - labs and BS have improved at this point. Will transition him over to home regimen - adequate po intake per the nurse. Will cont to monitor him.  - transfer him to tele  - diabetic education given  - patient states he has plenty of insulin supply at home which he has been getting for [place].   2. Hyponatremia, suspect pseudohypsecondary  Due to Hyperglycemia - resolved with ivf    3. Polysubstance abuse - Patient has a history of polysubstance abuse with history of cocaine use as well.  -positive for cocaine again this admission   4. Azotemia - ivf.  5. Noncompliance - insisted and educated on importance of medical compliace. He is unable to understand and repeat it back to me.   Code Status:  full DVT Prophylaxis:  lovenox 40 Family Communication: pt, no family at bedside Disposition Plan: likely tom if BS remains stable     Consultants:   None   Procedures:   None  Antimicrobials:   None    Subjective: No  complaints this morning. States he is was able to tolerate po intake earlier today.   Objective: Vitals:   06/28/16 1100 06/28/16 1200 06/28/16 1300 06/28/16 1400  BP:  105/64  106/74  Pulse:      Resp: (!) 21 18 (!) 22 (!) 21  Temp:  98.4 F (36.9 C)    TempSrc:  Axillary    SpO2: 96% 97% 96% 97%  Weight:      Height:        Intake/Output Summary (Last 24 hours) at 06/28/16 1625 Last data filed at 06/28/16 0527  Gross per 24 hour  Intake            763.3 ml  Output             2450 ml  Net          -1686.7 ml   Filed Weights   06/27/16 2114 06/28/16 0222  Weight: 77.1 kg (170 lb) 63.9 kg (140 lb 14 oz)    Examination:  General exam: Appears calm and comfortable  Respiratory system: Clear to auscultation. Respiratory effort normal. Cardiovascular system: S1 & S2 heard, RRR. No JVD, murmurs, rubs, gallops or clicks. No pedal edema. Gastrointestinal system: Abdomen is nondistended, soft and nontender. No organomegaly or masses felt. Normal bowel sounds heard. Central nervous system: Alert and oriented. No focal neurological deficits. Extremities: Symmetric 5 x 5 power. Skin: No rashes, lesions or ulcers Psychiatry: Judgement and insight appear normal. Mood & affect appropriate.     Data  Reviewed:   CBC:  Recent Labs Lab 06/27/16 2150 06/28/16 0346  WBC 7.8 9.8  HGB 12.9* 13.8  HCT 35.8* 38.8*  MCV 81.0 82.2  PLT 241 292   Basic Metabolic Panel:  Recent Labs Lab 06/27/16 2150 06/28/16 0346  NA 117* 139  K 5.0 3.6  CL 84* 103  CO2 24 25  GLUCOSE 1,058* 101*  BUN 31* 21*  CREATININE 1.19 0.77  CALCIUM 8.4* 9.4   GFR: Estimated Creatinine Clearance: 101 mL/min (by C-G formula based on SCr of 0.77 mg/dL). Liver Function Tests:  Recent Labs Lab 06/28/16 0346  AST 26  ALT 25  ALKPHOS 86  BILITOT 0.5  PROT 7.0  ALBUMIN 3.8   No results for input(s): LIPASE, AMYLASE in the last 168 hours. No results for input(s): AMMONIA in the last 168  hours. Coagulation Profile: No results for input(s): INR, PROTIME in the last 168 hours. Cardiac Enzymes: No results for input(s): CKTOTAL, CKMB, CKMBINDEX, TROPONINI in the last 168 hours. BNP (last 3 results) No results for input(s): PROBNP in the last 8760 hours. HbA1C: No results for input(s): HGBA1C in the last 72 hours. CBG:  Recent Labs Lab 06/28/16 0306 06/28/16 0405 06/28/16 0429 06/28/16 0736 06/28/16 1200  GLUCAP 106* 87 86 114* 382*   Lipid Profile: No results for input(s): CHOL, HDL, LDLCALC, TRIG, CHOLHDL, LDLDIRECT in the last 72 hours. Thyroid Function Tests: No results for input(s): TSH, T4TOTAL, FREET4, T3FREE, THYROIDAB in the last 72 hours. Anemia Panel: No results for input(s): VITAMINB12, FOLATE, FERRITIN, TIBC, IRON, RETICCTPCT in the last 72 hours. Sepsis Labs: No results for input(s): PROCALCITON, LATICACIDVEN in the last 168 hours.  Recent Results (from the past 240 hour(s))  MRSA PCR Screening     Status: None   Collection Time: 06/28/16  1:45 AM  Result Value Ref Range Status   MRSA by PCR NEGATIVE NEGATIVE Final    Comment:        The GeneXpert MRSA Assay (FDA approved for NASAL specimens only), is one component of a comprehensive MRSA colonization surveillance program. It is not intended to diagnose MRSA infection nor to guide or monitor treatment for MRSA infections.          Radiology Studies: No results found.      Scheduled Meds: . enoxaparin (LOVENOX) injection  40 mg Subcutaneous Q24H  . insulin aspart  0-9 Units Subcutaneous TID WC  . insulin aspart protamine- aspart  20 Units Subcutaneous BID WC  . sodium chloride flush  3 mL Intravenous Q12H   Continuous Infusions:   LOS: 1 day    Time spent: 35 mins     Madhuri Vacca Joline Maxcyhirag Josanne Boerema, MD [facility] Pager [phone]   If 7PM-7AM, please contact night-coverage [email or web address] Password [email or web address] 06/28/2016, 4:25 PM

## 2016-06-29 LAB — HEMOGLOBIN A1C
HEMOGLOBIN A1C: 12.7 % — AB (ref 4.8–5.6)
MEAN PLASMA GLUCOSE: 318 mg/dL

## 2016-06-29 LAB — GLUCOSE, CAPILLARY
GLUCOSE-CAPILLARY: 223 mg/dL — AB (ref 65–99)
Glucose-Capillary: 217 mg/dL — ABNORMAL HIGH (ref 65–99)

## 2016-06-29 MED ORDER — INSULIN ASPART PROT & ASPART (70-30 MIX) 100 UNIT/ML ~~LOC~~ SUSP
35.0000 [IU] | Freq: Two times a day (BID) | SUBCUTANEOUS | Status: DC
  Filled 2016-06-29: qty 10

## 2016-06-29 MED ORDER — INSULIN ASPART 100 UNIT/ML ~~LOC~~ SOLN: 0.0000 [IU] | Freq: Three times a day (TID) | SUBCUTANEOUS | 11 refills | Status: DC

## 2016-06-29 NOTE — Progress Notes (Signed)
Inpatient Diabetes Program Recommendations  AACE/ADA: New Consensus Statement on Inpatient Glycemic Control (2015)  Target Ranges:  Prepandial:   less than 140 mg/dL      Peak postprandial:   less than 180 mg/dL (1-2 hours)      Critically ill patients:  140 - 180 mg/dL   Lab Results  Component Value Date   GLUCAP 223 (H) 06/29/2016   HGBA1C 12.7 (H) 06/28/2016    Review of Glycemic Control:  Results for Dolores HooseLEMONS, [place] S (MRN [id number]) as of 06/29/2016 10:50  Ref. Range 06/28/2016 07:36 06/28/2016 12:00 06/28/2016 17:06 06/28/2016 21:46 06/28/2016 23:29 06/29/2016 07:32  Glucose-Capillary Latest Ref Range: 65 - 99 mg/dL 629114 (H) 528382 (H) 413164 (H) 69 231 (H) 223 (H)   Diabetes history: Type 2 diabetes Outpatient Diabetes medications: 70/30 40 units bid Current orders for Inpatient glycemic control:  70/30 40 units bid, Novolog sensitive tid with meals and HS  Inpatient Diabetes Program Recommendations:    Note that patient had mild low last PM.  May consider further reduction of 70/30 to 35 units bid.  Called patient by phone to discuss A1C results and potential causes of DKA/hyperglycemia.  He states that he checks blood sugars 5 times a day and takes insulin consistently.  He has been struggling with his teeth and dental abscesses lately and wonders if this may have made his blood sugars more unpredictable.  Discussed prevention of low blood sugars and eating snack prior to activity.  ? Whether polysubstance abuse may also be a factor in uncontrolled blood sugars.  Patient needs to follow-up with PCP or clinic.  [place] [facility] [place] [facility]  Patient appreciative of call.  Explained that blood sugar control is worse then last A1C.  Needs PCP!!  Thanks, Beryl MeagerJenny Clariece Roesler, RN, BC-ADM Inpatient Diabetes Coordinator Pager [phone] (8a-5p)

## 2016-06-29 NOTE — Progress Notes (Signed)
Discharged from floor via w/c for transport home by car. Belongings with pt. No changes in assessment. Rickey Smith  

## 2016-06-29 NOTE — Discharge Summary (Signed)
Physician Discharge Summary  Rickey HooseWilliam S Isais ZOX:[id number] DOB: 12/15/1965 DOA: 06/27/2016  PCP: No PCP Per Patient  Admit date: 06/27/2016 Discharge date: 06/29/2016  Admitted From: Home Disposition:  Home  Recommendations for Outpatient Follow-up:  [facility] 1. Information to [facility] provided  2. Please keep log of Blood sugars at home    Home Health:No Equipment/Devices:None  Discharge Condition:Stable CODE STATUS:FULL Diet recommendation: Carb Modified  Brief/Interim Summary: Rickey Smith is a 51 y.o. male medical history significant for poorly controlled diabetes mellitus type 1 secondary to with secondary neuropathy, polysubstance abuse, frequent hospitalizations and ER visits per notes.  He denies alcohol, tobacco or current drug use to me. He notes about a month ago he had a tooth abscess, infection requiring antibiotics about 2 weeks ago. Since then, he was getting over a cough. About 2 days ago, started having severe diarrhea, finally improving in last few hours. Blood sugars today around dinner time per pt was 98, what and he just took his insulin regularly. He is on NPH 70/30, 40 units 2 times a day and sliding scale insulin. Of note, labs to the ED had no anion gap was 9, however, see severe hyperglycemia felt nonketotic hyperglycemia range.  Of note, patient states she goes to [facility] actually did last see him in clinic back in 09/13/2015 and had not seen him since.  Hecurrently denies shortness of breath, chest pain, palpitations, no diarrhea  Currently, denies any nausea or vomiting. Denies any abdominal pains, dysuria, hematuria, hematochezia.    Not homeless, lives alone, but goes to his mother's sometimes as well.  He was in the Stepdown unit for a day on an insulin drip which was transitioned to home regimen of insulin 70/30 40 units  bid. His A1c was noted to be elevated and BS remained in the range of 200s. I have advised him to cont taking his current dose of insulin and not to lower his blood glucose too quickly in do it over the course of a week to avoid symptoms of relative hypoglycemia. He states he has all the medications and necessary equipments at home and wants to be discharged today. He has reached his maximum benefit from his inpatient stay. Will Discharge him today with instruction to [facility] in a week.   Discharge Diagnoses:  Principal Problem:   Diabetes mellitus due to underlying condition with hyperosmolarity without nonketotic hyperglycemic-hyperosmolar coma [facility]) (HCC) Active Problems:   Ex-smoker   Hyponatremia   Diabetic neuropathy (HCC)   Hyperglycemia  1.Uncontrolled diabetes type 1 with hyperosmolarity,nonketotic hyperglycemic - resolved - History of numerous admissions in the past has not followed up with primary care doctor since March of this past year - No signs of acute infection, although he did state he had diarrhea. He does not have a leukocytosis to warrant checking C. difficile at this time and his diarrhea has stopped - labs and BS have improved at this point.  - Insulin 70/30 40 bid at this time - adequate po intake per the nurse. Will cont to monitor him.  - diabetic education given  - patient states he has plenty of insulin supply at home which he has been getting for [place].  - he states he has all the supplied at home, therefore will transfer him. I have asked him to keep track of his  blood sugars at home and follow up with [facility] clinic within the next week.   2. Hyponatremia- resolved - resolved with ivf   3. Polysubstance abuse - Patient has a history of polysubstance abuse with history of cocaine use as well.  -positive for cocaine again this admission   4. Azotemia - improved  - ivf.  5. Noncompliance -  insisted and educated on importance of medical compliace. He is unable to understand and repeat it back to me.   Discharge Instructions   Allergies as of 06/29/2016      Reactions   Codeine Itching   Gabapentin Diarrhea, Nausea And Vomiting      Medication List    TAKE these medications   amitriptyline 75 MG tablet Commonly known as:  ELAVIL Take 1 tablet (75 mg total) by mouth at bedtime.   atorvastatin 40 MG tablet Commonly known as:  LIPITOR Take 1 tablet (40 mg total) by mouth daily at 6 PM.   clindamycin 150 MG capsule Commonly known as:  CLEOCIN Take 2 capsules (300 mg total) by mouth every 6 (six) hours.   famotidine 20 MG tablet Commonly known as:  PEPCID Take 1 tablet (20 mg total) by mouth 2 (two) times daily.   insulin aspart 100 UNIT/ML injection Commonly known as:  novoLOG Inject 0-9 Units into the skin 3 (three) times daily with meals.   insulin aspart protamine- aspart (70-30) 100 UNIT/ML injection Commonly known as:  NOVOLOG MIX 70/30 Inject 0.4 mLs (40 Units total) into the skin 2 (two) times daily with a meal.   lisinopril 20 MG tablet Commonly known as:  PRINIVIL,ZESTRIL Take 1 tablet (20 mg total) by mouth daily.   ondansetron 4 MG tablet Commonly known as:  ZOFRAN Take 1 tablet (4 mg total) by mouth every 6 (six) hours.   pregabalin 100 MG capsule Commonly known as:  LYRICA Take 1 capsule (100 mg total) by mouth 2 (two) times daily.      Follow-up Information    [facility]. Schedule an appointment as soon as possible for a visit in 1 week(s).   Contact information: [address] [place], [place] [address]  [phone]         Allergies  Allergen Reactions  . Codeine Itching  . Gabapentin Diarrhea and Nausea And Vomiting    Consultations:  None   Procedures/Studies:  No results found.    Subjective:   Discharge Exam: Vitals:   06/28/16 2135 06/29/16 0510  BP: 118/74 111/81  Pulse: 83 73   Resp: 16 16  Temp: 98.2 F (36.8 C) 98 F (36.7 C)   Vitals:   06/28/16 1400 06/28/16 1814 06/28/16 2135 06/29/16 0510  BP: 106/74 110/72 118/74 111/81  Pulse:  92 83 73  Resp: (!) 21 19 16 16   Temp:  98.2 F (36.8 C) 98.2 F (36.8 C) 98 F (36.7 C)  TempSrc:  Oral Oral Oral  SpO2: 97% 98% 99% 98%  Weight:      Height:        General: Pt is alert, awake, not in acute distress Cardiovascular: RRR, S1/S2 +, no rubs, no gallops Respiratory: CTA bilaterally, no wheezing, no rhonchi Abdominal: Soft, NT, ND, bowel sounds + Extremities: no edema, no cyanosis    The results of significant diagnostics from this hospitalization (including imaging, microbiology, ancillary and laboratory) are listed below for reference.     Microbiology: Recent Results (from the past 240 hour(s))  MRSA PCR Screening  Status: None   Collection Time: 06/28/16  1:45 AM  Result Value Ref Range Status   MRSA by PCR NEGATIVE NEGATIVE Final    Comment:        The GeneXpert MRSA Assay (FDA approved for NASAL specimens only), is one component of a comprehensive MRSA colonization surveillance program. It is not intended to diagnose MRSA infection nor to guide or monitor treatment for MRSA infections.      Labs: BNP (last 3 results)  Recent Labs  08/02/15 1306  BNP 30.1   Basic Metabolic Panel:  Recent Labs Lab 06/27/16 2150 06/28/16 0346  NA 117* 139  K 5.0 3.6  CL 84* 103  CO2 24 25  GLUCOSE 1,058* 101*  BUN 31* 21*  CREATININE 1.19 0.77  CALCIUM 8.4* 9.4   Liver Function Tests:  Recent Labs Lab 06/28/16 0346  AST 26  ALT 25  ALKPHOS 86  BILITOT 0.5  PROT 7.0  ALBUMIN 3.8   No results for input(s): LIPASE, AMYLASE in the last 168 hours. No results for input(s): AMMONIA in the last 168 hours. CBC:  Recent Labs Lab 06/27/16 2150 06/28/16 0346  WBC 7.8 9.8  HGB 12.9* 13.8  HCT 35.8* 38.8*  MCV 81.0 82.2  PLT 241 292   Cardiac Enzymes: No results for  input(s): CKTOTAL, CKMB, CKMBINDEX, TROPONINI in the last 168 hours. BNP: Invalid input(s): POCBNP CBG:  Recent Labs Lab 06/28/16 1706 06/28/16 2146 06/28/16 2329 06/29/16 0732 06/29/16 1212  GLUCAP 164* 69 231* 223* 217*   D-Dimer No results for input(s): DDIMER in the last 72 hours. Hgb A1c  Recent Labs  06/28/16 1430  HGBA1C 12.7*   Lipid Profile No results for input(s): CHOL, HDL, LDLCALC, TRIG, CHOLHDL, LDLDIRECT in the last 72 hours. Thyroid function studies No results for input(s): TSH, T4TOTAL, T3FREE, THYROIDAB in the last 72 hours.  Invalid input(s): FREET3 Anemia work up No results for input(s): VITAMINB12, FOLATE, FERRITIN, TIBC, IRON, RETICCTPCT in the last 72 hours. Urinalysis    Component Value Date/Time   COLORURINE COLORLESS (A) 06/27/2016 2144   APPEARANCEUR CLEAR 06/27/2016 2144   LABSPEC 1.020 06/27/2016 2144   PHURINE 7.0 06/27/2016 2144   GLUCOSEU >=500 (A) 06/27/2016 2144   HGBUR NEGATIVE 06/27/2016 2144   BILIRUBINUR NEGATIVE 06/27/2016 2144   BILIRUBINUR neg 08/10/2015 1041   KETONESUR 5 (A) 06/27/2016 2144   PROTEINUR NEGATIVE 06/27/2016 2144   UROBILINOGEN 2.0 08/10/2015 1041   UROBILINOGEN 0.2 10/23/2014 0817   NITRITE NEGATIVE 06/27/2016 2144   LEUKOCYTESUR NEGATIVE 06/27/2016 2144   Sepsis Labs Invalid input(s): PROCALCITONIN,  WBC,  LACTICIDVEN Microbiology Recent Results (from the past 240 hour(s))  MRSA PCR Screening     Status: None   Collection Time: 06/28/16  1:45 AM  Result Value Ref Range Status   MRSA by PCR NEGATIVE NEGATIVE Final    Comment:        The GeneXpert MRSA Assay (FDA approved for NASAL specimens only), is one component of a comprehensive MRSA colonization surveillance program. It is not intended to diagnose MRSA infection nor to guide or monitor treatment for MRSA infections.      Time coordinating discharge: Over 30 minutes  SIGNED:   Dimple Nanas, MD  [facility] 06/29/2016,  2:12 PM Pager   If 7PM-7AM, please contact night-coverage [email or web address] Password [email or web address]

## 2016-06-29 NOTE — Progress Notes (Signed)
PROGRESS NOTE    Rickey Smith  [id number] DOB: 02/09/1966 DOA: 06/27/2016 PCP: No PCP Per Patient   Brief Narrative:  Patient admitted after being to have found elevated Bs. [facility]. Tolerating po diet well. His BS are slightly elevated with home regimen at this time but occasionally drops down therefore will cont at this time.    Assessment & Plan:   Principal Problem:   Diabetes mellitus due to underlying condition with hyperosmolarity without nonketotic hyperglycemic-hyperosmolar coma [facility]) (HCC) Active Problems:   Ex-smoker   Hyponatremia   Diabetic neuropathy (HCC)   Hyperglycemia  1.Uncontrolled diabetes type 1  with hyperosmolarity, nonketotic hyperglycemic with hyponatremia and neuropathy - History of numerous admissions in the past has not followed up with primary care doctor since March of this past year - No signs of acute infection, although he did state he had diarrhea. He does not have a leukocytosis to warrant checking C. difficile at this time and his diarrhea has stopped - labs and BS have improved at this point.  - Insulin 70/30 40 bid at this time - adequate po intake per the nurse. Will cont to monitor him.  - diabetic education given  - patient states he has plenty of insulin supply at home which he has been getting for [place].  - he states he has all the supplied at home, therefore will transfer him. I have asked him to keep track of his blood sugars at home and follow up with [facility] within the next week.   2. Hyponatremia- resolved - resolved with ivf    3. Polysubstance abuse - Patient has a history of polysubstance abuse with history of cocaine use as well.  -positive for cocaine again this admission   4. Azotemia - improved  - ivf.  5. Noncompliance - insisted and educated on importance of medical compliace. He is unable to understand and repeat it back to me.   Code Status:  full DVT Prophylaxis:  lovenox 40 Family  Communication: pt, no family at bedside Disposition Plan: Discharge today     Consultants:   None   Procedures:   None  Antimicrobials:   None    Subjective: No complaints this morning. Patient desires to be discharged.  He did have one episode of slightly low BS but he tells me he didn't eat properly prior to it. But that he is eating his BS are around 200s range. I have asked him to slowly bring it down to a normal range over the course of next week or so otherwise he is going to start feeling symptoms of relative hypoglycemia. Provided him with the information to follow up at the clinic since he is uninsured.   Objective: Vitals:   06/28/16 1400 06/28/16 1814 06/28/16 2135 06/29/16 0510  BP: 106/74 110/72 118/74 111/81  Pulse:  92 83 73  Resp: (!) 21 19 16 16   Temp:  98.2 F (36.8 C) 98.2 F (36.8 C) 98 F (36.7 C)  TempSrc:  Oral Oral Oral  SpO2: 97% 98% 99% 98%  Weight:      Height:        Intake/Output Summary (Last 24 hours) at 06/29/16 1404 Last data filed at 06/29/16 0900  Gross per 24 hour  Intake              820 ml  Output                0 ml  Net  820 ml   Filed Weights   06/27/16 2114 06/28/16 0222  Weight: 77.1 kg (170 lb) 63.9 kg (140 lb 14 oz)    Examination:  General exam: Appears calm and comfortable  Respiratory system: Clear to auscultation. Respiratory effort normal. Cardiovascular system: S1 & S2 heard, RRR. No JVD, murmurs, rubs, gallops or clicks. No pedal edema. Gastrointestinal system: Abdomen is nondistended, soft and nontender. No organomegaly or masses felt. Normal bowel sounds heard. Central nervous system: Alert and oriented. No focal neurological deficits. Extremities: Symmetric 5 x 5 power. Skin: No rashes, lesions or ulcers Psychiatry: Judgement and insight appear normal. Mood & affect appropriate.     Data Reviewed:   CBC:  Recent Labs Lab 06/27/16 2150 06/28/16 0346  WBC 7.8 9.8  HGB 12.9* 13.8    HCT 35.8* 38.8*  MCV 81.0 82.2  PLT 241 292   Basic Metabolic Panel:  Recent Labs Lab 06/27/16 2150 06/28/16 0346  NA 117* 139  K 5.0 3.6  CL 84* 103  CO2 24 25  GLUCOSE 1,058* 101*  BUN 31* 21*  CREATININE 1.19 0.77  CALCIUM 8.4* 9.4   GFR: Estimated Creatinine Clearance: 101 mL/min (by C-G formula based on SCr of 0.77 mg/dL). Liver Function Tests:  Recent Labs Lab 06/28/16 0346  AST 26  ALT 25  ALKPHOS 86  BILITOT 0.5  PROT 7.0  ALBUMIN 3.8   No results for input(s): LIPASE, AMYLASE in the last 168 hours. No results for input(s): AMMONIA in the last 168 hours. Coagulation Profile: No results for input(s): INR, PROTIME in the last 168 hours. Cardiac Enzymes: No results for input(s): CKTOTAL, CKMB, CKMBINDEX, TROPONINI in the last 168 hours. BNP (last 3 results) No results for input(s): PROBNP in the last 8760 hours. HbA1C:  Recent Labs  06/28/16 1430  HGBA1C 12.7*   CBG:  Recent Labs Lab 06/28/16 1706 06/28/16 2146 06/28/16 2329 06/29/16 0732 06/29/16 1212  GLUCAP 164* 69 231* 223* 217*   Lipid Profile: No results for input(s): CHOL, HDL, LDLCALC, TRIG, CHOLHDL, LDLDIRECT in the last 72 hours. Thyroid Function Tests: No results for input(s): TSH, T4TOTAL, FREET4, T3FREE, THYROIDAB in the last 72 hours. Anemia Panel: No results for input(s): VITAMINB12, FOLATE, FERRITIN, TIBC, IRON, RETICCTPCT in the last 72 hours. Sepsis Labs: No results for input(s): PROCALCITON, LATICACIDVEN in the last 168 hours.  Recent Results (from the past 240 hour(s))  MRSA PCR Screening     Status: None   Collection Time: 06/28/16  1:45 AM  Result Value Ref Range Status   MRSA by PCR NEGATIVE NEGATIVE Final    Comment:        The GeneXpert MRSA Assay (FDA approved for NASAL specimens only), is one component of a comprehensive MRSA colonization surveillance program. It is not intended to diagnose MRSA infection nor to guide or monitor treatment for MRSA  infections.          Radiology Studies: No results found.      Scheduled Meds: . enoxaparin (LOVENOX) injection  40 mg Subcutaneous Q24H  . insulin aspart  0-9 Units Subcutaneous TID WC  . insulin aspart protamine- aspart  35 Units Subcutaneous BID WC  . sodium chloride flush  3 mL Intravenous Q12H   Continuous Infusions:   LOS: 2 days    Time spent: 35 mins     Ankit Joline Maxcy, MD [facility] Pager [phone]   If 7PM-7AM, please contact night-coverage [email or web address] Password [email or web address] 06/29/2016, 2:04 PM

## 2016-06-29 NOTE — Discharge Instructions (Signed)
Hyperglycemia Hyperglycemia is when the sugar (glucose) level in your blood is too high. It may not cause symptoms. If you do have symptoms, they may include warning signs, such as:  Feeling more thirsty than normal.  Hunger.  Feeling tired.  Needing to pee (urinate) more than normal.  Blurry eyesight (vision). You may get other symptoms as it gets worse, such as:  Dry mouth.  Not being hungry (loss of appetite).  Fruity-smelling breath.  Weakness.  Weight gain or loss that is not planned. Weight loss may be fast.  A tingling or numb feeling in your hands or feet.  Headache.  Skin that does not bounce back quickly when it is lightly pinched and released (poor skin turgor).  Pain in your belly (abdomen).  Cuts or bruises that heal slowly. High blood sugar can happen to people who do or do not have diabetes. High blood sugar can happen slowly or quickly, and it can be an emergency. Follow these instructions at home: General instructions  Take over-the-counter and prescription medicines only as told by your doctor.  Do not use products that contain nicotine or tobacco, such as cigarettes and e-cigarettes. If you need help quitting, ask your doctor.  Limit alcohol intake to no more than 1 drink per day for nonpregnant women and 2 drinks per day for men. One drink equals 12 oz of beer, 5 oz of wine, or 1 oz of hard liquor.  Manage stress. If you need help with this, ask your doctor.  Keep all follow-up visits as told by your doctor. This is important. Eating and drinking  Stay at a healthy weight.  Exercise regularly, as told by your doctor.  Drink enough fluid, especially when you:  Exercise.  Get sick.  Are in hot temperatures.  Eat healthy foods, such as:  Low-fat (lean) proteins.  Complex carbs (complex carbohydrates), such as whole wheat bread or brown rice.  Fresh fruits and vegetables.  Low-fat dairy products.  Healthy fats.  Drink enough  fluid to keep your pee (urine) clear or pale yellow. If you have diabetes:  Make sure you know the symptoms of hyperglycemia.  Follow your diabetes management plan, as told by your doctor. Make sure you:  Take insulin and medicines as told.  Follow your exercise plan.  Follow your meal plan. Eat on time. Do not skip meals.  Check your blood sugar as often as told. Make sure to check before and after exercise. If you exercise longer or in a different way than you normally do, check your blood sugar more often.  Follow your sick day plan whenever you cannot eat or drink normally. Make this plan ahead of time with your doctor.  Share your diabetes management plan with people in your workplace, school, and household.  Check your urine for ketones when you are ill and as told by your doctor.  Carry a card or wear jewelry that says that you have diabetes. Contact a doctor if:  Your blood sugar level is higher than 240 mg/dL (13.3 mmol/L) for 2 days in a row.  You have problems keeping your blood sugar in your target range.  High blood sugar happens often for you. Get help right away if:  You have trouble breathing.  You have a change in how you think, feel, or act (mental status).  You feel sick to your stomach (nauseous), and that feeling does not go away.  You cannot stop throwing up (vomiting). These symptoms may be an   emergency. Do not wait to see if the symptoms will go away. Get medical help right away. Call your local emergency services (911 in the [place] Do not drive yourself to the hospital.  Summary  Hyperglycemia is when the sugar (glucose) level in your blood is too high.  High blood sugar can happen to people who do or do not have diabetes.  Make sure you drink enough fluids, eat healthy foods, and exercise regularly.  Contact your doctor if you have problems keeping your blood sugar in your target range. This information is not intended to replace advice given  to you by your health care provider. Make sure you discuss any questions you have with your health care provider. Document Released: 04/07/2009 Document Revised: 02/26/2016 Document Reviewed: 02/26/2016 Elsevier Interactive Patient Education  2017 [employer]  

## 2016-08-23 ENCOUNTER — Other Ambulatory Visit: Payer: Self-pay | Admitting: Family Medicine

## 2016-12-27 ENCOUNTER — Encounter (HOSPITAL_COMMUNITY): Payer: Self-pay | Admitting: Emergency Medicine

## 2016-12-27 ENCOUNTER — Inpatient Hospital Stay (HOSPITAL_COMMUNITY)
Admission: EM | Admit: 2016-12-27 | Discharge: 2016-12-29 | DRG: 638 | Disposition: A | Discharge status: Home / Self Care | Payer: Self-pay | Attending: Internal Medicine | Admitting: Internal Medicine

## 2016-12-27 ENCOUNTER — Emergency Department (HOSPITAL_COMMUNITY): Payer: Self-pay

## 2016-12-27 DIAGNOSIS — N179 Acute kidney failure, unspecified: Secondary | ICD-10-CM | POA: Diagnosis present

## 2016-12-27 DIAGNOSIS — Z794 Long term (current) use of insulin: Secondary | ICD-10-CM

## 2016-12-27 DIAGNOSIS — F1910 Other psychoactive substance abuse, uncomplicated: Secondary | ICD-10-CM | POA: Diagnosis present

## 2016-12-27 DIAGNOSIS — I248 Other forms of acute ischemic heart disease: Secondary | ICD-10-CM | POA: Diagnosis present

## 2016-12-27 DIAGNOSIS — E1110 Type 2 diabetes mellitus with ketoacidosis without coma: Secondary | ICD-10-CM | POA: Diagnosis present

## 2016-12-27 DIAGNOSIS — R112 Nausea with vomiting, unspecified: Secondary | ICD-10-CM | POA: Diagnosis present

## 2016-12-27 DIAGNOSIS — Z9119 Patient's noncompliance with other medical treatment and regimen: Secondary | ICD-10-CM

## 2016-12-27 DIAGNOSIS — E1165 Type 2 diabetes mellitus with hyperglycemia: Secondary | ICD-10-CM

## 2016-12-27 DIAGNOSIS — R739 Hyperglycemia, unspecified: Secondary | ICD-10-CM

## 2016-12-27 DIAGNOSIS — E876 Hypokalemia: Secondary | ICD-10-CM | POA: Diagnosis present

## 2016-12-27 DIAGNOSIS — E1140 Type 2 diabetes mellitus with diabetic neuropathy, unspecified: Secondary | ICD-10-CM | POA: Diagnosis present

## 2016-12-27 DIAGNOSIS — E0810 Diabetes mellitus due to underlying condition with ketoacidosis without coma: Secondary | ICD-10-CM

## 2016-12-27 DIAGNOSIS — E1010 Type 1 diabetes mellitus with ketoacidosis without coma: Principal | ICD-10-CM | POA: Diagnosis present

## 2016-12-27 DIAGNOSIS — F1490 Cocaine use, unspecified, uncomplicated: Secondary | ICD-10-CM

## 2016-12-27 DIAGNOSIS — Z8249 Family history of ischemic heart disease and other diseases of the circulatory system: Secondary | ICD-10-CM

## 2016-12-27 DIAGNOSIS — F1410 Cocaine abuse, uncomplicated: Secondary | ICD-10-CM | POA: Diagnosis present

## 2016-12-27 DIAGNOSIS — R109 Unspecified abdominal pain: Secondary | ICD-10-CM | POA: Diagnosis present

## 2016-12-27 DIAGNOSIS — E869 Volume depletion, unspecified: Secondary | ICD-10-CM | POA: Diagnosis present

## 2016-12-27 LAB — RAPID URINE DRUG SCREEN, HOSP PERFORMED
AMPHETAMINES: NOT DETECTED
BENZODIAZEPINES: NOT DETECTED
Barbiturates: NOT DETECTED
Cocaine: POSITIVE — AB
OPIATES: NOT DETECTED
Tetrahydrocannabinol: NOT DETECTED

## 2016-12-27 LAB — CBC WITH DIFFERENTIAL/PLATELET
BASOS PCT: 0 %
Basophils Absolute: 0 10*3/uL (ref 0.0–0.1)
EOS ABS: 0 10*3/uL (ref 0.0–0.7)
Eosinophils Relative: 0 %
HEMATOCRIT: 46.9 % (ref 39.0–52.0)
HEMOGLOBIN: 16.6 g/dL (ref 13.0–17.0)
LYMPHS ABS: 1.4 10*3/uL (ref 0.7–4.0)
Lymphocytes Relative: 8 %
MCH: 29.4 pg (ref 26.0–34.0)
MCHC: 35.4 g/dL (ref 30.0–36.0)
MCV: 83 fL (ref 78.0–100.0)
MONO ABS: 1.5 10*3/uL — AB (ref 0.1–1.0)
MONOS PCT: 9 %
Neutro Abs: 14.9 10*3/uL — ABNORMAL HIGH (ref 1.7–7.7)
Neutrophils Relative %: 83 %
Platelets: 332 10*3/uL (ref 150–400)
RBC: 5.65 MIL/uL (ref 4.22–5.81)
RDW: 12.8 % (ref 11.5–15.5)
WBC: 17.9 10*3/uL — ABNORMAL HIGH (ref 4.0–10.5)

## 2016-12-27 LAB — URINALYSIS, ROUTINE W REFLEX MICROSCOPIC
BACTERIA UA: NONE SEEN
Bilirubin Urine: NEGATIVE
Glucose, UA: >=500 mg/dL — AB
HGB URINE DIPSTICK: NEGATIVE
Ketones, ur: 80 mg/dL — AB
Leukocytes, UA: NEGATIVE
NITRITE: NEGATIVE
Protein, ur: NEGATIVE mg/dL
SPECIFIC GRAVITY, URINE: 1.027 (ref 1.005–1.030)
SQUAMOUS EPITHELIAL / LPF: NONE SEEN
pH: 5 (ref 5.0–8.0)

## 2016-12-27 LAB — COMPREHENSIVE METABOLIC PANEL
ALBUMIN: 4.8 g/dL (ref 3.5–5.0)
ALK PHOS: 90 U/L (ref 38–126)
ALT: 16 U/L — ABNORMAL LOW (ref 17–63)
ANION GAP: 26 — AB (ref 5–15)
AST: 14 U/L — ABNORMAL LOW (ref 15–41)
BUN: 30 mg/dL — ABNORMAL HIGH (ref 6–20)
CALCIUM: 11.7 mg/dL — AB (ref 8.9–10.3)
CO2: 23 mmol/L (ref 22–32)
Chloride: 83 mmol/L — ABNORMAL LOW (ref 101–111)
Creatinine, Ser: 1.38 mg/dL — ABNORMAL HIGH (ref 0.61–1.24)
GFR calc non Af Amer: 58 mL/min — ABNORMAL LOW (ref >60)
GLUCOSE: 461 mg/dL — AB (ref 65–99)
POTASSIUM: 4.5 mmol/L (ref 3.5–5.1)
SODIUM: 132 mmol/L — AB (ref 135–145)
TOTAL PROTEIN: 8.6 g/dL — AB (ref 6.5–8.1)
Total Bilirubin: 1.8 mg/dL — ABNORMAL HIGH (ref 0.3–1.2)

## 2016-12-27 LAB — CBG MONITORING, ED
GLUCOSE-CAPILLARY: 376 mg/dL — AB (ref 65–99)
Glucose-Capillary: 447 mg/dL — ABNORMAL HIGH (ref 65–99)
Glucose-Capillary: 450 mg/dL — ABNORMAL HIGH (ref 65–99)
Glucose-Capillary: 283 mg/dL — ABNORMAL HIGH (ref 65–99)

## 2016-12-27 LAB — BLOOD GAS, VENOUS
Acid-base deficit: 0.2 mmol/L (ref 0.0–2.0)
Bicarbonate: 23.6 mmol/L (ref 20.0–28.0)
FIO2: 21
O2 Saturation: 51.6 %
PCO2 VEN: 34.2 mmHg — AB (ref 44.0–60.0)
PH VEN: 7.449 — AB (ref 7.250–7.430)
Patient temperature: 37

## 2016-12-27 LAB — GLUCOSE, CAPILLARY
GLUCOSE-CAPILLARY: 281 mg/dL — AB (ref 65–99)
Glucose-Capillary: 239 mg/dL — ABNORMAL HIGH (ref 65–99)

## 2016-12-27 LAB — LACTIC ACID, PLASMA
LACTIC ACID, VENOUS: 2 mmol/L — AB (ref 0.5–1.9)
Lactic Acid, Venous: 2.1 mmol/L (ref 0.5–1.9)

## 2016-12-27 LAB — SALICYLATE LEVEL: Salicylate Lvl: <7 mg/dL (ref 2.8–30.0)

## 2016-12-27 LAB — TROPONIN I
TROPONIN I: 0.18 ng/mL — AB (ref <0.03)
Troponin I: 0.23 ng/mL (ref <0.03)

## 2016-12-27 LAB — LIPASE, BLOOD: LIPASE: 17 U/L (ref 11–51)

## 2016-12-27 LAB — ETHANOL

## 2016-12-27 MED ORDER — SODIUM CHLORIDE 0.9 % IV SOLN
INTRAVENOUS | Status: DC
  Administered 2016-12-27: 19:00:00 via INTRAVENOUS
  Filled 2016-12-27 (×2): qty 1

## 2016-12-27 MED ORDER — PANTOPRAZOLE SODIUM 40 MG IV SOLR
40.0000 mg | Freq: Once | INTRAVENOUS | Status: AC
  Administered 2016-12-27: 40 mg via INTRAVENOUS
  Filled 2016-12-27: qty 40

## 2016-12-27 MED ORDER — DEXTROSE-NACL 5-0.45 % IV SOLN
INTRAVENOUS | Status: DC
  Administered 2016-12-27: 23:00:00 via INTRAVENOUS

## 2016-12-27 MED ORDER — SODIUM CHLORIDE 0.9 % IV SOLN
INTRAVENOUS | Status: DC
  Administered 2016-12-27: 23:00:00 via INTRAVENOUS

## 2016-12-27 MED ORDER — SODIUM CHLORIDE 0.9 % IV SOLN
8.0000 mg | Freq: Once | INTRAVENOUS | Status: AC
  Administered 2016-12-27: 8 mg via INTRAVENOUS
  Filled 2016-12-27: qty 4

## 2016-12-27 MED ORDER — SODIUM CHLORIDE 0.9 % IV BOLUS (SEPSIS)
1000.0000 mL | Freq: Once | INTRAVENOUS | Status: AC
  Administered 2016-12-27: 1000 mL via INTRAVENOUS

## 2016-12-27 MED ORDER — POTASSIUM CHLORIDE 10 MEQ/100ML IV SOLN
10.0000 meq | INTRAVENOUS | Status: AC
  Administered 2016-12-27 – 2016-12-28 (×2): 10 meq via INTRAVENOUS
  Filled 2016-12-27 (×2): qty 100

## 2016-12-27 MED ORDER — ONDANSETRON HCL 4 MG/2ML IJ SOLN
INTRAMUSCULAR | Status: AC
  Filled 2016-12-27: qty 2

## 2016-12-27 MED ORDER — ONDANSETRON 4 MG PO TBDP
ORAL_TABLET | ORAL | Status: AC
  Filled 2016-12-27: qty 1

## 2016-12-27 MED ORDER — GI COCKTAIL ~~LOC~~
30.0000 mL | Freq: Once | ORAL | Status: AC
Start: 2016-12-27 — End: 2016-12-27
  Administered 2016-12-27: 30 mL via ORAL
  Filled 2016-12-27: qty 30

## 2016-12-27 MED ORDER — SODIUM CHLORIDE 0.9 % IV SOLN
INTRAVENOUS | Status: DC
  Filled 2016-12-27 (×2): qty 1

## 2016-12-27 MED ORDER — ONDANSETRON HCL 4 MG/2ML IJ SOLN
4.0000 mg | Freq: Four times a day (QID) | INTRAMUSCULAR | Status: DC | PRN
  Administered 2016-12-28: 4 mg via INTRAVENOUS
  Filled 2016-12-27: qty 2

## 2016-12-27 MED ORDER — SODIUM CHLORIDE 0.9 % IV SOLN: Freq: Once | INTRAVENOUS | Status: DC

## 2016-12-27 MED ORDER — DEXTROSE-NACL 5-0.45 % IV SOLN: INTRAVENOUS | Status: DC

## 2016-12-27 NOTE — ED Provider Notes (Addendum)
[facility]Y DEPT Provider Note   CSN: [id number] Arrival date & time: 12/27/16  1646     History   Chief Complaint Chief Complaint  Patient presents with  . Emesis    HPI Rickey Smith is a 51 y.o. male. Chief complaint is vomiting, diabetic  HPI:  [age]-year-old male. Type 1 diabetic. Presents for evaluation of vomiting and high blood sugar at home.  Patient states that he does not work and lives alone. He states he called his mom today because he felt confused. He thought it was Wednesday. He has difficulty accounting for Wednesday and Thursday this week. States he does not recall the last time used insulin but thinks it was yesterday. Has cocaine in his urine here, states that he last used on Sunday.  Has been vomiting. He has some abdominal pain. No chest pain. He is breathing hard and fast but denies feeling shortness of breath.  No fever or cough. No diarrhea. Denies alcohol or volatile alcohol ingestion.  Past Medical History:  Diagnosis Date  . Diabetes mellitus without complication (HCC)   . Migraine     Patient Active Problem List   Diagnosis Date Noted  . Diabetes mellitus due to underlying condition with hyperosmolarity without nonketotic hyperglycemic-hyperosmolar coma [facility] (HCC) 06/27/2016  . Periodontal disease 05/02/2016  . Tobacco abuse 04/30/2016  . Polysubstance abuse 04/30/2016  . Hyperglycemia 04/30/2016  . ARF (acute renal failure) (HCC) 04/23/2016  . Unresponsive episode 04/23/2016  . DKA, type 2 (HCC) 04/23/2016  . Sepsis (HCC) 08/12/2015  . Prostate abscess 08/11/2015  . Abscess 08/11/2015  . UTI (lower urinary tract infection) 08/11/2015  . Diabetic neuropathy (HCC) 08/10/2015  . Arterial hypotension   . Tachycardia 08/02/2015  . SIRS (systemic inflammatory response syndrome) (HCC) 08/02/2015  . GERD (gastroesophageal reflux disease) 10/26/2014  . Leukocytosis 10/23/2014  . Hyperkalemia 10/23/2014  . Hyperglycemia without ketosis  07/13/2014  . Diabetic hyperosmolar non-ketotic state (HCC) 07/13/2014  . Diabetes mellitus with nonketotic hyperosmolarity (HCC) 07/13/2014  . DKA (diabetic ketoacidoses) (HCC) 04/26/2014  . Chest pain, ROMI, DDX pericarditis 04/26/2014  . Ex-smoker 04/26/2014  . Hyponatremia 04/26/2014  . Metabolic acidosis 04/26/2014    Past Surgical History:  Procedure Laterality Date  . TRANSURETHRAL RESECTION OF PROSTATE N/A 08/16/2015   Procedure: TRANSURETHRAL RESECTION DRAINAGE OF PROSTATE ABCESS;  Surgeon: Barron Alvineavid Grapey, MD;  Location: [facility];  Service: Urology;  Laterality: N/A;       Home Medications    Prior to Admission medications   Medication Sig Start Date End Date Taking? Authorizing Provider  amitriptyline (ELAVIL) 75 MG tablet Take 1 tablet (75 mg total) by mouth at bedtime. 08/10/15  Yes Jaclyn ShaggyAmao, Enobong, MD  insulin aspart (NOVOLOG) 100 UNIT/ML injection Inject into the skin 3 (three) times daily before meals.   Yes [provider]  insulin aspart protamine- aspart (NOVOLOG MIX 70/30) (70-30) 100 UNIT/ML injection Inject 0.4 mLs (40 Units total) into the skin 2 (two) times daily with a meal. 04/24/16  Yes Osvaldo ShipperKrishnan, Gokul, MD    Family History Family History  Problem Relation Age of Onset  . Heart attack Unknown   . Prostate cancer Unknown   . Hypertension Unknown   . Hypertension Mother   . Cancer Father     Social History Social History  Substance Use Topics  . Smoking status: Current Some Day Smoker    Packs/day: 0.50    Years: 20.00    Types: Cigarettes  . Smokeless tobacco: Never Used  .  Alcohol use No     Allergies   Codeine and Gabapentin   Review of Systems Review of Systems  Constitutional: Negative for appetite change, chills, diaphoresis, fatigue and fever.  HENT: Negative for mouth sores, sore throat and trouble swallowing.   Eyes: Negative for visual disturbance.  Respiratory: Negative for cough, chest tightness and wheezing.         Tachypnea  Cardiovascular: Negative for chest pain.  Gastrointestinal: Positive for abdominal pain, nausea and vomiting. Negative for abdominal distention and diarrhea.  Endocrine: Negative for polydipsia, polyphagia and polyuria.  Genitourinary: Negative for dysuria, frequency and hematuria.  Musculoskeletal: Negative for gait problem.  Skin: Negative for color change, pallor and rash.  Neurological: Negative for dizziness, syncope, light-headedness and headaches.  Hematological: Does not bruise/bleed easily.  Psychiatric/Behavioral: Positive for confusion. Negative for behavioral problems.     Physical Exam Updated Vital Signs BP (!) 162/105 (BP Location: Left Arm)   Pulse (!) 122   Temp 97.6 F (36.4 C) (Oral)   Resp 14   Ht 6\' 1"  (1.854 m)   Wt 72.6 kg (160 lb)   SpO2 96%   BMI 21.11 kg/m   Physical Exam  Constitutional: He is oriented to person, place, and time. He has a sickly appearance. He appears ill. He appears distressed.  Appears acutely ill. Tachycardic and tachypneic. He is oriented. He answers questions appropriately  HENT:  Head: Normocephalic.  Membranes dry. No scleral icterus. Conjunctiva not pale.  Eyes: Conjunctivae are normal. Pupils are equal, round, and reactive to light. No scleral icterus.  Neck: Normal range of motion. Neck supple. No thyromegaly present.  Cardiovascular: Normal rate and regular rhythm.  Exam reveals no gallop and no friction rub.   No murmur heard. Sinus tachycardia on the monitor.  Pulmonary/Chest: Effort normal and breath sounds normal. Tachypnea noted. No respiratory distress. He has no wheezes. He has no rales.  Tachypneic. No increased work of breathing. Clear lungs.  Abdominal: Soft. Bowel sounds are normal. He exhibits no distension. There is no tenderness. There is no rebound.  Soft abdomen. He complains of no tenderness. He complains of some nausea.  Musculoskeletal: Normal range of motion.  Neurological: He is alert and  oriented to person, place, and time.  Mentating well  Skin: Skin is warm and dry. No rash noted.  Psychiatric: He has a normal mood and affect. His behavior is normal.     ED Treatments / Results  Labs (all labs ordered are listed, but only abnormal results are displayed) Labs Reviewed  CBC WITH DIFFERENTIAL/PLATELET - Abnormal; Notable for the following:       Result Value   WBC 17.9 (*)    Neutro Abs 14.9 (*)    Monocytes Absolute 1.5 (*)    All other components within normal limits  COMPREHENSIVE METABOLIC PANEL - Abnormal; Notable for the following:    Sodium 132 (*)    Chloride 83 (*)    Glucose, Bld 461 (*)    BUN 30 (*)    Creatinine, Ser 1.38 (*)    Calcium 11.7 (*)    Total Protein 8.6 (*)    AST 14 (*)    ALT 16 (*)    Total Bilirubin 1.8 (*)    GFR calc non Af Amer 58 (*)    Anion gap 26 (*)    All other components within normal limits  TROPONIN I - Abnormal; Notable for the following:    Troponin I 0.23 (*)  All other components within normal limits  URINALYSIS, ROUTINE W REFLEX MICROSCOPIC - Abnormal; Notable for the following:    Color, Urine STRAW (*)    Glucose, UA >=500 (*)    Ketones, ur 80 (*)    All other components within normal limits  BLOOD GAS, VENOUS - Abnormal; Notable for the following:    pH, Ven 7.449 (*)    pCO2, Ven 34.2 (*)    All other components within normal limits  RAPID URINE DRUG SCREEN, HOSP PERFORMED - Abnormal; Notable for the following:    Cocaine POSITIVE (*)    All other components within normal limits  CBG MONITORING, ED - Abnormal; Notable for the following:    Glucose-Capillary 447 (*)    All other components within normal limits  CBG MONITORING, ED - Abnormal; Notable for the following:    Glucose-Capillary 450 (*)    All other components within normal limits  LIPASE, BLOOD  ETHANOL  OSMOLALITY  SALICYLATE LEVEL  LACTIC ACID, PLASMA  LACTIC ACID, PLASMA  I-STAT TROPOININ, ED    EKG  EKG  Interpretation  Date/Time:  Friday December 27 2016 18:17:42 EDT Ventricular Rate:  130 PR Interval:    QRS Duration: 114 QT Interval:  432 QTC Calculation: 636 R Axis:   78 Text Interpretation:  Sinus tachycardia LAE, consider biatrial enlargement Borderline intraventricular conduction delay Nonspecific repol abnormality, diffuse leads Prolonged QT interval Confirmed by Rolland Porter ([id number]) on 12/27/2016 6:47:11 PM       Radiology No results found.  Procedures Procedures (including critical care time)  Medications Ordered in ED Medications  0.9 %  sodium chloride infusion (not administered)  dextrose 5 %-0.45 % sodium chloride infusion (not administered)  insulin regular (NOVOLIN R,HUMULIN R) 100 Units in sodium chloride 0.9 % 100 mL (1 Units/mL) infusion ( Intravenous New Bag/Given 12/27/16 1903)  ondansetron (ZOFRAN-ODT) 4 MG disintegrating tablet (not administered)  ondansetron (ZOFRAN) 4 MG/2ML injection (not administered)  sodium chloride 0.9 % bolus 1,000 mL (not administered)  sodium chloride 0.9 % bolus 1,000 mL (1,000 mLs Intravenous New Bag/Given 12/27/16 1800)  ondansetron (ZOFRAN) 8 mg in sodium chloride 0.9 % 50 mL IVPB (0 mg Intravenous Stopped 12/27/16 1927)  gi cocktail (Maalox,Lidocaine,Donnatal) (30 mLs Oral Given 12/27/16 1910)  pantoprazole (PROTONIX) injection 40 mg (40 mg Intravenous Given 12/27/16 1910)     Initial Impression / Assessment and Plan / ED Course  I have reviewed the triage vital signs and the nursing notes.  Pertinent labs & imaging results that were available during my care of the patient were reviewed by me and considered in my medical decision making (see chart for details).   blood sugar 449. With history of no insulin, tachypnea and tachycardia with vomiting initial presumptive diagnosis of diabetic ketoacidosis. Given fluid bolus. Labs obtained. Started on glucose stabilizer.  19:45: Confusing metabolic picture. He has normal pH and minimally low  PCO2. His bicarbonate is normal. He is hyperglycemic. But has elevated anion gap. Denies all little alcohol ingestion. Has cocaine in his urine. No ethanol. Does not have pain out of proportion that I would expect ischemic bowel from cocaine. Has slightly elevated troponin. His EKG has T-wave inversions but no ST changes. This may be NSTEMI, vs demand ischemia from tachycardia. Is getting additional fluid boluses. I requested osmolality to rule out bowel alcohol, salicylate level, and lactate. He is afebrile on recheck temperature. Plan continuing fluid resuscitation, insulin.   CRITICAL CARE Performed by: Claudean Kinds  Total critical care time: 60 minutes  Critical care time was exclusive of separately billable procedures and treating other patients.  Critical care was necessary to treat or prevent imminent or life-threatening deterioration.  Critical care was time spent personally by me on the following activities: development of treatment plan with patient and/or surrogate as well as nursing, discussions with consultants, evaluation of patient's response to treatment, examination of patient, obtaining history from patient or surrogate, ordering and performing treatments and interventions, ordering and review of laboratory studies, ordering and review of radiographic studies, pulse oximetry and re-evaluation of patient's condition.   20:55:  Recheck temp 97.8.  Recheck troponin 0.18. Lactate 2.1. Nursing placing second IV as fluids infusing slowly accompanying insulin drip. Has had 1500 mL. 3 L total ordered.  Final Clinical Impressions(s) / ED Diagnoses   Final diagnoses:  Hyperglycemia    New Prescriptions New Prescriptions   No medications on file     Rolland Porter, MD 12/27/16 Sable Feil    Rolland Porter, MD 12/27/16 Rushie Goltz    Rolland Porter, MD 12/27/16 0454    Rolland Porter, MD 12/27/16 2100

## 2016-12-27 NOTE — H&P (Signed)
History and Physical    Rickey Smith [id number] DOB: 05/14/1966 DOA: 12/27/2016  PCP: Patient, No Pcp Per  Patient coming from:  home  Chief Complaint:  vomiting  HPI: Rickey Smith is a 51 y.o. male with medical history significant of DM , polysubstance abuse comes in with overa  Day of vomiting.  Pt reports using cocaine on earlier in the week.  Has not taken his insulin it at least 3 days.  Comes in not feeling well with vomiting.  No fevers.  No diarrhea.  Pt referred for admission for possible early DKA with mild elevation of his trop.  He denies any chest pain.    Review of Systems: As per HPI otherwise 10 point review of systems negative.   Past Medical History:  Diagnosis Date  . Diabetes mellitus without complication (HCC)   . Migraine     Past Surgical History:  Procedure Laterality Date  . TRANSURETHRAL RESECTION OF PROSTATE N/A 08/16/2015   Procedure: TRANSURETHRAL RESECTION DRAINAGE OF PROSTATE ABCESS;  Surgeon: Barron Alvineavid Grapey, MD;  Location: [facility];  Service: Urology;  Laterality: N/A;     reports that he has been smoking Cigarettes.  He has a 10.00 pack-year smoking history. He has never used smokeless tobacco. He reports that he uses drugs, including Cocaine and Other-see comments. He reports that he does not drink alcohol.  Allergies  Allergen Reactions  . Codeine Itching  . Gabapentin Diarrhea and Nausea And Vomiting    Family History  Problem Relation Age of Onset  . Heart attack Unknown   . Prostate cancer Unknown   . Hypertension Unknown   . Hypertension Mother   . Cancer Father     Prior to Admission medications   Medication Sig Start Date End Date Taking? Authorizing Provider  amitriptyline (ELAVIL) 75 MG tablet Take 1 tablet (75 mg total) by mouth at bedtime. 08/10/15  Yes Jaclyn ShaggyAmao, Enobong, MD  insulin aspart (NOVOLOG) 100 UNIT/ML injection Inject into the skin 3 (three) times daily before meals.   Yes [provider]  insulin aspart  protamine- aspart (NOVOLOG MIX 70/30) (70-30) 100 UNIT/ML injection Inject 0.4 mLs (40 Units total) into the skin 2 (two) times daily with a meal. 04/24/16  Yes Osvaldo ShipperKrishnan, Gokul, MD    Physical Exam: Vitals:   12/27/16 2021 12/27/16 2116 12/27/16 2118 12/27/16 2220  BP: (!) 117/93  137/90   Pulse: (!) 131  (!) 104   Resp: 20  15   Temp:  98.3 F (36.8 C)  97.7 F (36.5 C)  TempSrc:  Oral  Oral  SpO2: 98%  97%   Weight:    64.9 kg (143 lb 1.3 oz)  Height:    6\' 1"  (1.854 m)    Constitutional: NAD, calm, comfortable Vitals:   12/27/16 2021 12/27/16 2116 12/27/16 2118 12/27/16 2220  BP: (!) 117/93  137/90   Pulse: (!) 131  (!) 104   Resp: 20  15   Temp:  98.3 F (36.8 C)  97.7 F (36.5 C)  TempSrc:  Oral  Oral  SpO2: 98%  97%   Weight:    64.9 kg (143 lb 1.3 oz)  Height:    6\' 1"  (1.854 m)   Eyes: PERRL, lids and conjunctivae normal ENMT: Mucous membranes are moist. Posterior pharynx clear of any exudate or lesions.Normal dentition.  Neck: normal, supple, no masses, no thyromegaly Respiratory: clear to auscultation bilaterally, no wheezing, no crackles. Normal respiratory effort. No accessory muscle use.  Cardiovascular: Regular rate and rhythm, no murmurs / rubs / gallops. No extremity edema. 2+ pedal pulses. No carotid bruits.  Abdomen: no tenderness, no masses palpated. No hepatosplenomegaly. Bowel sounds positive.  Musculoskeletal: no clubbing / cyanosis. No joint deformity upper and lower extremities. Good ROM, no contractures. Normal muscle tone.  Skin: no rashes, lesions, ulcers. No induration Neurologic: CN 2-12 grossly intact. Sensation intact, DTR normal. Strength 5/5 in all 4.  Psychiatric: Normal judgment and insight. Alert and oriented x 3. Normal mood.    Labs on Admission: I have personally reviewed following labs and imaging studies  CBC:  Recent Labs Lab 12/27/16 1755  WBC 17.9*  NEUTROABS 14.9*  HGB 16.6  HCT 46.9  MCV 83.0  PLT 332   Basic  Metabolic Panel:  Recent Labs Lab 12/27/16 1755  NA 132*  K 4.5  CL 83*  CO2 23  GLUCOSE 461*  BUN 30*  CREATININE 1.38*  CALCIUM 11.7*   GFR: Estimated Creatinine Clearance: 58.8 mL/min (A) (by C-G formula based on SCr of 1.38 mg/dL (H)). Liver Function Tests:  Recent Labs Lab 12/27/16 1755  AST 14*  ALT 16*  ALKPHOS 90  BILITOT 1.8*  PROT 8.6*  ALBUMIN 4.8    Recent Labs Lab 12/27/16 1755  LIPASE 17   Cardiac Enzymes:  Recent Labs Lab 12/27/16 1755 12/27/16 2010  TROPONINI 0.23* 0.18*   CBG:  Recent Labs Lab 12/27/16 1704 12/27/16 1851 12/27/16 2013 12/27/16 2120 12/27/16 2217  GLUCAP 447* 450* 376* 283* 281*    Urine analysis:    Component Value Date/Time   COLORURINE STRAW (A) 12/27/2016 1734   APPEARANCEUR CLEAR 12/27/2016 1734   LABSPEC 1.027 12/27/2016 1734   PHURINE 5.0 12/27/2016 1734   GLUCOSEU >=500 (A) 12/27/2016 1734   HGBUR NEGATIVE 12/27/2016 1734   BILIRUBINUR NEGATIVE 12/27/2016 1734   BILIRUBINUR neg 08/10/2015 1041   KETONESUR 80 (A) 12/27/2016 1734   PROTEINUR NEGATIVE 12/27/2016 1734   UROBILINOGEN 2.0 08/10/2015 1041   UROBILINOGEN 0.2 10/23/2014 0817   NITRITE NEGATIVE 12/27/2016 1734   LEUKOCYTESUR NEGATIVE 12/27/2016 1734   Radiological Exams on Admission: Dg Chest Port 1 View  Result Date: 12/27/2016 CLINICAL DATA:  Tachycardia. EXAM: PORTABLE CHEST 1 VIEW COMPARISON:  04/30/2016 and prior chest radiographs FINDINGS: The cardiomediastinal silhouette is unremarkable. There is no evidence of focal airspace disease, pulmonary edema, suspicious pulmonary nodule/mass, pleural effusion, or pneumothorax. No acute bony abnormalities are identified. IMPRESSION: No active disease. Electronically Signed   By: Harmon Pier M.D.   On: 12/27/2016 20:10    EKG: Independently reviewed. Sinus tach   Assessment/Plan 51 yo male with longstanding h/o noncompliance and cocaine abuse comes in with n/v and possible early  DKA  Principal Problem:   DKA (diabetic ketoacidoses) (HCC)- mild and early if any, ketones in urine, co2 is normal.  Gap is 23.  Insulin drip.  Repeat bmp now.  ivf.  Active Problems:   Polysubstance abuse- cocaine positive again   Positive trop - trending down, likely demand ischemia.  Will need outpt stress testing at some point.   Pt has been advised he will not be getting any controlled substances while here, provide prn zofran for his vomiting.  He has also been educated about the importance of using his insulin and the dangers of cocaine abuse.   DVT prophylaxis: scds  Code Status:  full Family Communication:  none  Disposition Plan:  Per day team Consults called:  none Admission status:  observation  Merin Borjon A MD [facility]  If 7PM-7AM, please contact night-coverage [email or web address] Password [email or web address]  12/27/2016, 10:41 PM

## 2016-12-27 NOTE — ED Notes (Signed)
Date and time results received: 12/27/16   Test: lactic acid Critical Value: 2.1  Name of Provider Notified: Fayrene Fearingjames  Orders Received? None

## 2016-12-27 NOTE — ED Triage Notes (Signed)
Patient complaining of abdominal pain x vomiting x "days." States "I think I might be in DKA and I don't even remember what today is."

## 2016-12-27 NOTE — ED Notes (Signed)
Dr Fayrene FearingJames apprised of [facility]

## 2016-12-27 NOTE — ED Notes (Signed)
Venous blood gas result  Ph 7.449 PCO2  34.2 PO2 less than reportable rangw HCO3 23.6 SO2 51.6

## 2016-12-28 DIAGNOSIS — E1110 Type 2 diabetes mellitus with ketoacidosis without coma: Secondary | ICD-10-CM

## 2016-12-28 DIAGNOSIS — E1141 Type 2 diabetes mellitus with diabetic mononeuropathy: Secondary | ICD-10-CM

## 2016-12-28 DIAGNOSIS — F1490 Cocaine use, unspecified, uncomplicated: Secondary | ICD-10-CM

## 2016-12-28 DIAGNOSIS — E1165 Type 2 diabetes mellitus with hyperglycemia: Secondary | ICD-10-CM

## 2016-12-28 DIAGNOSIS — Z794 Long term (current) use of insulin: Secondary | ICD-10-CM

## 2016-12-28 LAB — BASIC METABOLIC PANEL
ANION GAP: 15 (ref 5–15)
Anion gap: 11 (ref 5–15)
BUN: 25 mg/dL — ABNORMAL HIGH (ref 6–20)
BUN: 20 mg/dL (ref 6–20)
CHLORIDE: 97 mmol/L — AB (ref 101–111)
CHLORIDE: 101 mmol/L (ref 101–111)
CO2: 23 mmol/L (ref 22–32)
CO2: 25 mmol/L (ref 22–32)
CREATININE: 0.92 mg/dL (ref 0.61–1.24)
Calcium: 9.4 mg/dL (ref 8.9–10.3)
Calcium: 9 mg/dL (ref 8.9–10.3)
Creatinine, Ser: 1.06 mg/dL (ref 0.61–1.24)
GFR calc Af Amer: >60 mL/min (ref >60)
GFR calc non Af Amer: >60 mL/min (ref >60)
GFR calc non Af Amer: >60 mL/min (ref >60)
GLUCOSE: 150 mg/dL — AB (ref 65–99)
Glucose, Bld: 248 mg/dL — ABNORMAL HIGH (ref 65–99)
POTASSIUM: 3.7 mmol/L (ref 3.5–5.1)
POTASSIUM: 3.5 mmol/L (ref 3.5–5.1)
Sodium: 135 mmol/L (ref 135–145)
Sodium: 137 mmol/L (ref 135–145)

## 2016-12-28 LAB — LIPID PANEL
CHOL/HDL RATIO: 5.1 ratio
Cholesterol: 239 mg/dL — ABNORMAL HIGH (ref 0–200)
HDL: 47 mg/dL (ref >40)
LDL Cholesterol: 170 mg/dL — ABNORMAL HIGH (ref 0–99)
Triglycerides: 110 mg/dL (ref <150)
VLDL: 22 mg/dL (ref 0–40)

## 2016-12-28 LAB — GLUCOSE, CAPILLARY
GLUCOSE-CAPILLARY: 151 mg/dL — AB (ref 65–99)
GLUCOSE-CAPILLARY: 128 mg/dL — AB (ref 65–99)
GLUCOSE-CAPILLARY: 119 mg/dL — AB (ref 65–99)
GLUCOSE-CAPILLARY: 126 mg/dL — AB (ref 65–99)
GLUCOSE-CAPILLARY: 135 mg/dL — AB (ref 65–99)
GLUCOSE-CAPILLARY: 247 mg/dL — AB (ref 65–99)
Glucose-Capillary: 146 mg/dL — ABNORMAL HIGH (ref 65–99)
Glucose-Capillary: 155 mg/dL — ABNORMAL HIGH (ref 65–99)
Glucose-Capillary: 193 mg/dL — ABNORMAL HIGH (ref 65–99)
Glucose-Capillary: 212 mg/dL — ABNORMAL HIGH (ref 65–99)
Glucose-Capillary: 237 mg/dL — ABNORMAL HIGH (ref 65–99)
Glucose-Capillary: 287 mg/dL — ABNORMAL HIGH (ref 65–99)

## 2016-12-28 LAB — TROPONIN I
TROPONIN I: 0.21 ng/mL — AB (ref <0.03)
TROPONIN I: 0.18 ng/mL — AB (ref <0.03)
TROPONIN I: 0.14 ng/mL — AB (ref <0.03)
TROPONIN I: 0.12 ng/mL — AB (ref <0.03)

## 2016-12-28 LAB — CBC
HCT: 35.5 % — ABNORMAL LOW (ref 39.0–52.0)
Hemoglobin: 12.3 g/dL — ABNORMAL LOW (ref 13.0–17.0)
MCH: 29.3 pg (ref 26.0–34.0)
MCHC: 34.6 g/dL (ref 30.0–36.0)
MCV: 84.5 fL (ref 78.0–100.0)
PLATELETS: 180 10*3/uL (ref 150–400)
RBC: 4.2 MIL/uL — ABNORMAL LOW (ref 4.22–5.81)
RDW: 13.1 % (ref 11.5–15.5)
WBC: 11.4 10*3/uL — AB (ref 4.0–10.5)

## 2016-12-28 LAB — OSMOLALITY: Osmolality: 321 mOsm/kg (ref 275–295)

## 2016-12-28 LAB — MRSA PCR SCREENING: MRSA BY PCR: NEGATIVE

## 2016-12-28 LAB — LACTIC ACID, PLASMA: LACTIC ACID, VENOUS: 2.1 mmol/L — AB (ref 0.5–1.9)

## 2016-12-28 MED ORDER — ENOXAPARIN SODIUM 40 MG/0.4ML ~~LOC~~ SOLN
40.0000 mg | SUBCUTANEOUS | Status: DC
  Administered 2016-12-28 – 2016-12-29 (×2): 40 mg via SUBCUTANEOUS
  Filled 2016-12-28 (×2): qty 0.4

## 2016-12-28 MED ORDER — SENNA 8.6 MG PO TABS
2.0000 | ORAL_TABLET | Freq: Every day | ORAL | Status: DC
  Administered 2016-12-28 – 2016-12-29 (×2): 17.2 mg via ORAL
  Filled 2016-12-28 (×2): qty 2

## 2016-12-28 MED ORDER — DOCUSATE SODIUM 100 MG PO CAPS
100.0000 mg | ORAL_CAPSULE | Freq: Two times a day (BID) | ORAL | Status: DC
  Administered 2016-12-28 – 2016-12-29 (×3): 100 mg via ORAL
  Filled 2016-12-28 (×3): qty 1

## 2016-12-28 MED ORDER — DIPHENHYDRAMINE HCL 50 MG/ML IJ SOLN
12.5000 mg | Freq: Once | INTRAMUSCULAR | Status: AC
  Administered 2016-12-28: 12.5 mg via INTRAVENOUS
  Filled 2016-12-28: qty 1

## 2016-12-28 MED ORDER — ACETAMINOPHEN 500 MG PO TABS
500.0000 mg | ORAL_TABLET | Freq: Once | ORAL | Status: AC
  Administered 2016-12-28: 500 mg via ORAL
  Filled 2016-12-28: qty 1

## 2016-12-28 MED ORDER — POTASSIUM CHLORIDE IN NACL 20-0.9 MEQ/L-% IV SOLN
INTRAVENOUS | Status: DC
  Administered 2016-12-28 – 2016-12-29 (×4): via INTRAVENOUS

## 2016-12-28 MED ORDER — ADULT MULTIVITAMIN W/MINERALS CH
1.0000 | ORAL_TABLET | Freq: Every day | ORAL | Status: DC
  Administered 2016-12-28 – 2016-12-29 (×2): 1 via ORAL
  Filled 2016-12-28 (×2): qty 1

## 2016-12-28 MED ORDER — PROCHLORPERAZINE EDISYLATE 5 MG/ML IJ SOLN
5.0000 mg | Freq: Once | INTRAMUSCULAR | Status: AC
  Administered 2016-12-28: 5 mg via INTRAVENOUS
  Filled 2016-12-28: qty 2

## 2016-12-28 MED ORDER — INSULIN ASPART 100 UNIT/ML ~~LOC~~ SOLN: 0.0000 [IU] | Freq: Three times a day (TID) | SUBCUTANEOUS | Status: DC

## 2016-12-28 MED ORDER — INSULIN ASPART PROT & ASPART (70-30 MIX) 100 UNIT/ML ~~LOC~~ SUSP
40.0000 [IU] | Freq: Two times a day (BID) | SUBCUTANEOUS | Status: DC
  Filled 2016-12-28: qty 10

## 2016-12-28 MED ORDER — LIVING WELL WITH DIABETES BOOK
Freq: Once | Status: DC
Start: 2016-12-28 — End: 2016-12-29
  Filled 2016-12-28: qty 1

## 2016-12-28 MED ORDER — GLUCERNA SHAKE PO LIQD
237.0000 mL | Freq: Two times a day (BID) | ORAL | Status: DC
  Administered 2016-12-28 – 2016-12-29 (×3): 237 mL via ORAL

## 2016-12-28 MED ORDER — INSULIN ASPART PROT & ASPART (70-30 MIX) 100 UNIT/ML ~~LOC~~ SUSP
20.0000 [IU] | Freq: Two times a day (BID) | SUBCUTANEOUS | Status: DC
  Administered 2016-12-28 – 2016-12-29 (×3): 20 [IU] via SUBCUTANEOUS
  Filled 2016-12-28: qty 10

## 2016-12-28 NOTE — Progress Notes (Signed)
CRITICAL VALUE ALERT  Critical Value:  Serum osmolality 321  Date & Time Notied:  12/28/16 1133  Provider Notified: Dr. Arbutus Leasat, hospitalist  Orders Received/Actions taken: MD on unit, made aware, new orders placed.

## 2016-12-28 NOTE — Progress Notes (Signed)
Initial Nutrition Assessment  DOCUMENTATION CODES:  Not applicable (Would likely meet some form malnutrition with physical exam)  INTERVENTION:  Glucerna Shake po BID, each supplement provides 220 kcal and 10 grams of protein  MVI with minerals   NUTRITION DIAGNOSIS:  Predicted suboptimal nutrient intake related to social / environmental circumstances (Cocaine abuse) as evidenced by Example of patient not taking insulin for 3 days and experiencing nausea/abdominal pain as result of drug use.  GOAL:  Patient will meet greater than or equal to 90% of their needs  MONITOR:  PO intake, Supplement acceptance, Diet advancement, Labs  REASON FOR ASSESSMENT:  Malnutrition Screening Tool    ASSESSMENT:  51 y/o male PMHx DM and polysubstance abuse. Presented with over a day of vomiting. Admitted to using cocaine earlier this week and had not taken insulin x3 days. Worked up for early DKA and admitted for management.   RD is operating remotely. Pt reviewed due to positive MST with report of decreased appetite and weight loss. Per chart review, the patient had been very confused and could not remember what day it was. He had vomiting and abdominal pain. Suspect this is all related to his drug abuse and not taking insulin. His PO intake should improve with management of BG.   In regards to his weight, he appears to have been admitted at weight of 143 lbs. He was weighed at ~141 lbs in January and 140-145 the latter part of last year. His weights greater than 160 appear to have been reported/estimated. Highly suspect his weight is stable, though he is certainly below his IBW for height.   Will order glucerna and MVI to help get patient as much nutrition as can while admitted.   He would likely meet criteria for atleast moderate malnutrition with physical exam, though unable to perform right now.   Physical Exam: Unable to assess  Labs: BG now 120-155 Meds: KCL, IVF, Zofran   Recent  Labs Lab 12/27/16 1755 12/27/16 2327 12/28/16 0821  NA 132* 135 137  K 4.5 3.7 3.5  CL 83* 97* 101  CO2 23 23 25   BUN 30* 25* 20  CREATININE 1.38* 1.06 0.92  CALCIUM 11.7* 9.4 9.0  GLUCOSE 461* 248* 150*   Diet Order:  Diet Carb Modified Fluid consistency: Thin; Room service appropriate? Yes  Skin:  Reviewed, no issues  Last BM:  7/6   Height:  Ht Readings from Last 1 Encounters:  12/27/16 6\' 1"  (1.854 m)   Weight:  Wt Readings from Last 1 Encounters:  12/28/16 149 lb 7.6 oz (67.8 kg)   Wt Readings from Last 10 Encounters:  12/28/16 149 lb 7.6 oz (67.8 kg)  06/28/16 140 lb 14 oz (63.9 kg)  05/01/16 141 lb 5 oz (64.1 kg)  04/23/16 145 lb (65.8 kg)  03/13/16 143 lb 12.8 oz (65.2 kg)  02/11/16 170 lb (77.1 kg)  11/28/15 165 lb (74.8 kg)  10/24/15 165 lb (74.8 kg)  09/13/15 161 lb (73 kg)  08/22/15 155 lb 3.2 oz (70.4 kg)   Ideal Body Weight:  83.64 kg  BMI:  Body mass index is 19.72 kg/m.  Estimated Nutritional Needs:  Kcal:  2000-2200 kcals (31-34 kcal/kg bw) Protein:  78-91g Pro (1.2-1.4 g/kg bw) Fluid:  >2.3 L (35 ml/kg bw)  EDUCATION NEEDS:  No education needs identified at this time  Christophe LouisNathan Queenie Aufiero RD, LDN, CNSC Clinical Nutrition Pager: [phone] 12/28/2016 9:24 AM

## 2016-12-28 NOTE — Progress Notes (Signed)
PROGRESS NOTE  Rickey Smith ZOX:[id number] DOB: 1966/01/16 DOA: 12/27/2016 PCP: Patient, No Pcp Per  Brief History:  51 year old male with a history of diabetes mellitus type 2 and polysubstance abuse presenting with one-day history of abdominal pain with associated nausea and vomiting. The patient states that he last used his insulin on 12/24/2016. He endorses occasional noncompliance. He states that he snorted some cocaine on 12/24/2016 and went to take a nap after taking his insulin. The next thing he remembers, he was waking up on the morning of 12/27/2017 with abdominal pain with associated nausea and vomiting. He denies any fevers, chills, headache, chest pain, shortness breath, dysuria, hematuria, hematochezia, melena.  In the emergency department, the patient was found to have some glucose of 461 with anion gap of 26 and ketonuria. He was started on an insulin drip and IV fluids.  Assessment/Plan: DKA in type 2 DM -insulin drip turned off at 0615 -BMP at 2330 still showed anion gap -restart IVF--pt remains clinically volume depleted -check HbA1C -remain npo for now -transition to [place] insulin once gap is closed  DM type 2 -although listed as type 2, suspect he may be a LADA -check HbA1C -check LDL  Cocaine Abuse -cessation discussed -check HIV -hep B surface antigen -hep C antibody  Elevated troponin -personally reviewed EKG--no ST-T change -due to demand ischemia -no chest pain  AKI -secondary to volume depletion -improving with IVF -baseline creatinine 0.7-1.0 -presenting creatinine 1.38    Disposition Plan:   Home in 1-2 days  Family Communication:  No Family at bedside  Consultants:  none  Code Status:  FULL   DVT Prophylaxis:  [place] Lovenox   Procedures: As Listed in Progress Note Above  Antibiotics: None    Subjective: Patient denies fevers, chills, headache, chest pain, dyspnea, nausea, vomiting, diarrhea, abdominal pain, dysuria,  hematuria, hematochezia, and melena.   Objective: Vitals:   12/28/16 0600 12/28/16 0615 12/28/16 0630 12/28/16 0722  BP: 134/75 130/79    Pulse: 85 90 97 91  Resp: 19 20 (!) 21 18  Temp:    98.8 F (37.1 C)  TempSrc:    Oral  SpO2: 95% 97% 96% 96%  Weight:      Height:        Intake/Output Summary (Last 24 hours) at 12/28/16 0746 Last data filed at 12/28/16 0615  Gross per 24 hour  Intake           1205.4 ml  Output              400 ml  Net            805.4 ml   Weight change:  Exam:   General:  Pt is alert, follows commands appropriately, not in acute distress  HEENT: No icterus, No thrush, No neck mass, [place]/AT  Cardiovascular: RRR, S1/S2, no rubs, no gallops  Respiratory: CTA bilaterally, no wheezing, no crackles, no rhonchi  Abdomen: Soft/+BS, non tender, non distended, no guarding  Extremities: No edema, No lymphangitis, No petechiae, No rashes, no synovitis   Data Reviewed: I have personally reviewed following labs and imaging studies Basic Metabolic Panel:  Recent Labs Lab 12/27/16 1755 12/27/16 2327  NA 132* 135  K 4.5 3.7  CL 83* 97*  CO2 23 23  GLUCOSE 461* 248*  BUN 30* 25*  CREATININE 1.38* 1.06  CALCIUM 11.7* 9.4   Liver Function Tests:  Recent Labs Lab 12/27/16 1755  AST 14*  ALT 16*  ALKPHOS 90  BILITOT 1.8*  PROT 8.6*  ALBUMIN 4.8    Recent Labs Lab 12/27/16 1755  LIPASE 17   No results for input(s): AMMONIA in the last 168 hours. Coagulation Profile: No results for input(s): INR, PROTIME in the last 168 hours. CBC:  Recent Labs Lab 12/27/16 1755  WBC 17.9*  NEUTROABS 14.9*  HGB 16.6  HCT 46.9  MCV 83.0  PLT 332   Cardiac Enzymes:  Recent Labs Lab 12/27/16 1755 12/27/16 2010 12/27/16 2327 12/28/16 0417  TROPONINI 0.23* 0.18* 0.21* 0.18*   BNP: Invalid input(s): POCBNP CBG:  Recent Labs Lab 12/28/16 0322 12/28/16 0423 12/28/16 0521 12/28/16 0620 12/28/16 0715  GLUCAP 151* 155* 128* 119* 126*     HbA1C: No results for input(s): HGBA1C in the last 72 hours. Urine analysis:    Component Value Date/Time   COLORURINE STRAW (A) 12/27/2016 1734   APPEARANCEUR CLEAR 12/27/2016 1734   LABSPEC 1.027 12/27/2016 1734   PHURINE 5.0 12/27/2016 1734   GLUCOSEU >=500 (A) 12/27/2016 1734   HGBUR NEGATIVE 12/27/2016 1734   BILIRUBINUR NEGATIVE 12/27/2016 1734   BILIRUBINUR neg 08/10/2015 1041   KETONESUR 80 (A) 12/27/2016 1734   PROTEINUR NEGATIVE 12/27/2016 1734   UROBILINOGEN 2.0 08/10/2015 1041   UROBILINOGEN 0.2 10/23/2014 0817   NITRITE NEGATIVE 12/27/2016 1734   LEUKOCYTESUR NEGATIVE 12/27/2016 1734   Sepsis Labs: @LABRCNTIP (procalcitonin:4,lacticidven:4) ) Recent Results (from the past 240 hour(s))  MRSA PCR Screening     Status: None   Collection Time: 12/27/16 11:06 PM  Result Value Ref Range Status   MRSA by PCR NEGATIVE NEGATIVE Final    Comment:        The GeneXpert MRSA Assay (FDA approved for NASAL specimens only), is one component of a comprehensive MRSA colonization surveillance program. It is not intended to diagnose MRSA infection nor to guide or monitor treatment for MRSA infections.      Scheduled Meds: . living well with diabetes book   Does not apply Once   Continuous Infusions: . 0.9 % NaCl with KCl 20 mEq / L      Procedures/Studies: Dg Chest Port 1 View  Result Date: 12/27/2016 CLINICAL DATA:  Tachycardia. EXAM: PORTABLE CHEST 1 VIEW COMPARISON:  04/30/2016 and prior chest radiographs FINDINGS: The cardiomediastinal silhouette is unremarkable. There is no evidence of focal airspace disease, pulmonary edema, suspicious pulmonary nodule/mass, pleural effusion, or pneumothorax. No acute bony abnormalities are identified. IMPRESSION: No active disease. Electronically Signed   By: Harmon PierJeffrey  Hu M.D.   On: 12/27/2016 20:10    Andie Mungin, DO  [facility] Pager [phone]  If 7PM-7AM, please contact night-coverage [email or web address] Password  [email or web address] 12/28/2016, 7:46 AM   LOS: 0 days

## 2016-12-28 NOTE — Plan of Care (Signed)
Problem: Safety: Goal: Ability to remain free from injury will improve Outcome: Progressing Side rails up, bed in low position, call bell and needed items within reach.

## 2016-12-28 NOTE — Progress Notes (Signed)
CRITICAL VALUE ALERT  Critical Value:  Troponin 0.21 Date & Time Notied:  12/28/16  0105 Provider Notified: Onalee Huaavid Orders Received/Actions taken:no new orders

## 2016-12-29 DIAGNOSIS — E0941 Drug or chemical induced diabetes mellitus with neurological complications with diabetic mononeuropathy: Secondary | ICD-10-CM

## 2016-12-29 LAB — CBC
HCT: 36.4 % — ABNORMAL LOW (ref 39.0–52.0)
HEMOGLOBIN: 12.3 g/dL — AB (ref 13.0–17.0)
MCH: 28.7 pg (ref 26.0–34.0)
MCHC: 33.8 g/dL (ref 30.0–36.0)
MCV: 84.8 fL (ref 78.0–100.0)
Platelets: 146 10*3/uL — ABNORMAL LOW (ref 150–400)
RBC: 4.29 MIL/uL (ref 4.22–5.81)
RDW: 12.9 % (ref 11.5–15.5)
WBC: 6.6 10*3/uL (ref 4.0–10.5)

## 2016-12-29 LAB — GLUCOSE, CAPILLARY
GLUCOSE-CAPILLARY: 185 mg/dL — AB (ref 65–99)
Glucose-Capillary: 185 mg/dL — ABNORMAL HIGH (ref 65–99)

## 2016-12-29 LAB — BASIC METABOLIC PANEL
ANION GAP: 6 (ref 5–15)
BUN: 9 mg/dL (ref 6–20)
CO2: 26 mmol/L (ref 22–32)
Calcium: 8 mg/dL — ABNORMAL LOW (ref 8.9–10.3)
Chloride: 101 mmol/L (ref 101–111)
Creatinine, Ser: 0.56 mg/dL — ABNORMAL LOW (ref 0.61–1.24)
GFR calc Af Amer: >60 mL/min (ref >60)
GLUCOSE: 206 mg/dL — AB (ref 65–99)
POTASSIUM: 3.4 mmol/L — AB (ref 3.5–5.1)
Sodium: 133 mmol/L — ABNORMAL LOW (ref 135–145)

## 2016-12-29 LAB — MAGNESIUM: MAGNESIUM: 1.9 mg/dL (ref 1.7–2.4)

## 2016-12-29 LAB — HEMOGLOBIN A1C
HEMOGLOBIN A1C: 12.2 % — AB (ref 4.8–5.6)
MEAN PLASMA GLUCOSE: 303 mg/dL

## 2016-12-29 LAB — HIV ANTIBODY (ROUTINE TESTING W REFLEX): HIV Screen 4th Generation wRfx: NONREACTIVE

## 2016-12-29 MED ORDER — POTASSIUM CHLORIDE CRYS ER 20 MEQ PO TBCR
20.0000 meq | EXTENDED_RELEASE_TABLET | Freq: Once | ORAL | Status: AC
  Administered 2016-12-29: 20 meq via ORAL
  Filled 2016-12-29: qty 1

## 2016-12-29 MED ORDER — INSULIN ASPART 100 UNIT/ML ~~LOC~~ SOLN: 0.0000 [IU] | Freq: Every day | SUBCUTANEOUS | Status: DC

## 2016-12-29 MED ORDER — INSULIN ASPART PROT & ASPART (70-30 MIX) 100 UNIT/ML ~~LOC~~ SUSP
30.0000 [IU] | Freq: Two times a day (BID) | SUBCUTANEOUS | Status: DC
  Filled 2016-12-29: qty 10

## 2016-12-29 MED ORDER — INSULIN ASPART 100 UNIT/ML ~~LOC~~ SOLN
0.0000 [IU] | Freq: Three times a day (TID) | SUBCUTANEOUS | Status: DC
  Administered 2016-12-29: 3 [IU] via SUBCUTANEOUS

## 2016-12-29 NOTE — Progress Notes (Signed)
Patient is to be discharged home and in stable condition. Patient given discharge instructions and verbalized understanding. Patient awaiting transportation at this time.

## 2016-12-29 NOTE — Discharge Summary (Signed)
Physician Discharge Summary  Rickey HooseWilliam S Smith RUE:[id number] DOB: 10/23/1965 DOA: 12/27/2016  PCP: Patient, No Pcp Per  Admit date: 12/27/2016 Discharge date: 12/29/2016  Admitted From: Home Disposition:  Home  Recommendations for Outpatient Follow-up:  1. Follow up with PCP in 1-2 weeks 2. Please obtain BMP/CBC in one week  Discharge Condition: Stable CODE STATUS: FULL Diet recommendation: Heart Healthy / Carb Modified    Brief/Interim Summary: 51 year old male with a history of diabetes mellitus type 2 and polysubstance abuse presenting with one-day history of abdominal pain with associated nausea and vomiting. The patient states that he last used his insulin on 12/24/2016. He endorses occasional noncompliance. He states that he snorted some cocaine on 12/24/2016 and went to take a nap after taking his insulin. The next thing he remembers, he was waking up on the morning of 12/27/2017 with abdominal pain with associated nausea and vomiting. He denies any fevers, chills, headache, chest pain, shortness breath, dysuria, hematuria, hematochezia, melena.  In the emergency department, the patient was found to have some glucose of 461 with anion gap of 26 and ketonuria. He was started on an insulin drip and IV fluids. The patient improved clinically. He was transitioned to subcutaneous insulin.  Discharge Diagnoses:   DKA in type 2 DM -restart IVF--pt remains clinically volume depleted-->improved -check HbA1C--pending at time of discharge -diet advanced which pt tolerated -transitioned to [facility] insulin 70/30 -home with previous home dose 40 units bid  DM type 2 -although listed as type 2, suspect he may be a LADA -check HbA1C--pending at time of discharge -check LDL--170--lifestyle modification for now and follow up with PCP  Cocaine Abuse -cessation discussed -check HIV--pending -hep B surface antigen--pending -hep C antibody--pending  Elevated troponin -personally reviewed EKG--no  ST-T change -due to demand ischemia -no chest pain  AKI -secondary to volume depletion -improving with IVF -baseline creatinine 0.6-1.0 -presenting creatinine 1.38 -serum creatinine 0.56 at time of dicharge  Hypokalemia -replete  Discharge Instructions  Discharge Instructions    Diet - low sodium heart healthy    Complete by:  As directed    Increase activity slowly    Complete by:  As directed      Allergies as of 12/29/2016      Reactions   Codeine Itching   Gabapentin Diarrhea, Nausea And Vomiting      Medication List    TAKE these medications   amitriptyline 75 MG tablet Commonly known as:  ELAVIL Take 1 tablet (75 mg total) by mouth at bedtime.   insulin aspart 100 UNIT/ML injection Commonly known as:  novoLOG Inject into the skin 3 (three) times daily before meals.   insulin aspart protamine- aspart (70-30) 100 UNIT/ML injection Commonly known as:  NOVOLOG MIX 70/30 Inject 0.4 mLs (40 Units total) into the skin 2 (two) times daily with a meal.       Allergies  Allergen Reactions  . Codeine Itching  . Gabapentin Diarrhea and Nausea And Vomiting    Consultations:  none   Procedures/Studies: Dg Chest Port 1 View  Result Date: 12/27/2016 CLINICAL DATA:  Tachycardia. EXAM: PORTABLE CHEST 1 VIEW COMPARISON:  04/30/2016 and prior chest radiographs FINDINGS: The cardiomediastinal silhouette is unremarkable. There is no evidence of focal airspace disease, pulmonary edema, suspicious pulmonary nodule/mass, pleural effusion, or pneumothorax. No acute bony abnormalities are identified. IMPRESSION: No active disease. Electronically Signed   By: Harmon PierJeffrey  Hu M.D.   On: 12/27/2016 20:10        Discharge Exam: Vitals:  12/29/16 0000 12/29/16 0646  BP: 138/72 (!) 132/98  Pulse: 81 77  Resp: 16 16  Temp: 98.5 F (36.9 C) 98.4 F (36.9 C)   Vitals:   12/28/16 1605 12/28/16 2217 12/29/16 0000 12/29/16 0646  BP:  (!) 142/79 138/72 (!) 132/98  Pulse: 93  87 81 77  Resp: 18 18 16 16   Temp: 98.3 F (36.8 C) 98.7 F (37.1 C) 98.5 F (36.9 C) 98.4 F (36.9 C)  TempSrc: Oral Oral Oral Oral  SpO2: 96% 100% 98% 97%  Weight:      Height:        General: Pt is alert, awake, not in acute distress Cardiovascular: RRR, S1/S2 +, no rubs, no gallops Respiratory: CTA bilaterally, no wheezing, no rhonchi Abdominal: Soft, NT, ND, bowel sounds + Extremities: no edema, no cyanosis   The results of significant diagnostics from this hospitalization (including imaging, microbiology, ancillary and laboratory) are listed below for reference.    Significant Diagnostic Studies: Dg Chest Port 1 View  Result Date: 12/27/2016 CLINICAL DATA:  Tachycardia. EXAM: PORTABLE CHEST 1 VIEW COMPARISON:  04/30/2016 and prior chest radiographs FINDINGS: The cardiomediastinal silhouette is unremarkable. There is no evidence of focal airspace disease, pulmonary edema, suspicious pulmonary nodule/mass, pleural effusion, or pneumothorax. No acute bony abnormalities are identified. IMPRESSION: No active disease. Electronically Signed   By: Harmon Pier M.D.   On: 12/27/2016 20:10     Microbiology: Recent Results (from the past 240 hour(s))  MRSA PCR Screening     Status: None   Collection Time: 12/27/16 11:06 PM  Result Value Ref Range Status   MRSA by PCR NEGATIVE NEGATIVE Final    Comment:        The GeneXpert MRSA Assay (FDA approved for NASAL specimens only), is one component of a comprehensive MRSA colonization surveillance program. It is not intended to diagnose MRSA infection nor to guide or monitor treatment for MRSA infections.      Labs: Basic Metabolic Panel:  Recent Labs Lab 12/27/16 1755 12/27/16 2327 12/28/16 0821 12/29/16 0640  NA 132* 135 137 133*  K 4.5 3.7 3.5 3.4*  CL 83* 97* 101 101  CO2 23 23 25 26   GLUCOSE 461* 248* 150* 206*  BUN 30* 25* 20 9  CREATININE 1.38* 1.06 0.92 0.56*  CALCIUM 11.7* 9.4 9.0 8.0*  MG  --   --   --  1.9    Liver Function Tests:  Recent Labs Lab 12/27/16 1755  AST 14*  ALT 16*  ALKPHOS 90  BILITOT 1.8*  PROT 8.6*  ALBUMIN 4.8    Recent Labs Lab 12/27/16 1755  LIPASE 17   No results for input(s): AMMONIA in the last 168 hours. CBC:  Recent Labs Lab 12/27/16 1755 12/28/16 1136 12/29/16 0640  WBC 17.9* 11.4* 6.6  NEUTROABS 14.9*  --   --   HGB 16.6 12.3* 12.3*  HCT 46.9 35.5* 36.4*  MCV 83.0 84.5 84.8  PLT 332 180 146*   Cardiac Enzymes:  Recent Labs Lab 12/27/16 2010 12/27/16 2327 12/28/16 0417 12/28/16 1136 12/28/16 1649  TROPONINI 0.18* 0.21* 0.18* 0.14* 0.12*   BNP: Invalid input(s): POCBNP CBG:  Recent Labs Lab 12/28/16 0934 12/28/16 1121 12/28/16 1604 12/28/16 2107 12/29/16 0721  GLUCAP 135* 247* 237* 287* 185*    Time coordinating discharge:  Greater than 30 minutes  Signed:  Shakirra Buehler, DO [facility] Pager: [phone] 12/29/2016, 10:00 AM

## 2016-12-30 LAB — HEPATITIS B SURFACE ANTIGEN: Hepatitis B Surface Ag: NEGATIVE

## 2016-12-30 LAB — HEPATITIS C ANTIBODY

## 2017-04-01 ENCOUNTER — Encounter (HOSPITAL_COMMUNITY): Payer: Self-pay | Admitting: Emergency Medicine

## 2017-04-01 ENCOUNTER — Inpatient Hospital Stay (HOSPITAL_COMMUNITY)
Admission: EM | Admit: 2017-04-01 | Discharge: 2017-04-03 | DRG: 639 | Disposition: A | Discharge status: Home / Self Care | Payer: Self-pay | Attending: Oncology | Admitting: Oncology

## 2017-04-01 DIAGNOSIS — Z794 Long term (current) use of insulin: Secondary | ICD-10-CM

## 2017-04-01 DIAGNOSIS — Z79899 Other long term (current) drug therapy: Secondary | ICD-10-CM

## 2017-04-01 DIAGNOSIS — Z91128 Patient's intentional underdosing of medication regimen for other reason: Secondary | ICD-10-CM

## 2017-04-01 DIAGNOSIS — R112 Nausea with vomiting, unspecified: Secondary | ICD-10-CM

## 2017-04-01 DIAGNOSIS — Z888 Allergy status to other drugs, medicaments and biological substances status: Secondary | ICD-10-CM

## 2017-04-01 DIAGNOSIS — Z23 Encounter for immunization: Secondary | ICD-10-CM

## 2017-04-01 DIAGNOSIS — X58XXXA Exposure to other specified factors, initial encounter: Secondary | ICD-10-CM | POA: Diagnosis present

## 2017-04-01 DIAGNOSIS — R110 Nausea: Secondary | ICD-10-CM | POA: Diagnosis present

## 2017-04-01 DIAGNOSIS — G43909 Migraine, unspecified, not intractable, without status migrainosus: Secondary | ICD-10-CM | POA: Diagnosis present

## 2017-04-01 DIAGNOSIS — F17210 Nicotine dependence, cigarettes, uncomplicated: Secondary | ICD-10-CM | POA: Diagnosis present

## 2017-04-01 DIAGNOSIS — K2970 Gastritis, unspecified, without bleeding: Secondary | ICD-10-CM | POA: Diagnosis present

## 2017-04-01 DIAGNOSIS — T383X6A Underdosing of insulin and oral hypoglycemic [antidiabetic] drugs, initial encounter: Secondary | ICD-10-CM | POA: Diagnosis present

## 2017-04-01 DIAGNOSIS — E1010 Type 1 diabetes mellitus with ketoacidosis without coma: Principal | ICD-10-CM | POA: Diagnosis present

## 2017-04-01 DIAGNOSIS — Z885 Allergy status to narcotic agent status: Secondary | ICD-10-CM

## 2017-04-01 DIAGNOSIS — E1110 Type 2 diabetes mellitus with ketoacidosis without coma: Secondary | ICD-10-CM | POA: Diagnosis present

## 2017-04-01 DIAGNOSIS — I10 Essential (primary) hypertension: Secondary | ICD-10-CM | POA: Diagnosis present

## 2017-04-01 LAB — COMPREHENSIVE METABOLIC PANEL
ALBUMIN: 4.7 g/dL (ref 3.5–5.0)
ALT: 18 U/L (ref 17–63)
AST: 13 U/L — AB (ref 15–41)
Alkaline Phosphatase: 87 U/L (ref 38–126)
Anion gap: 27 — ABNORMAL HIGH (ref 5–15)
BILIRUBIN TOTAL: 1.5 mg/dL — AB (ref 0.3–1.2)
BUN: 19 mg/dL (ref 6–20)
CO2: 9 mmol/L — ABNORMAL LOW (ref 22–32)
CREATININE: 1.31 mg/dL — AB (ref 0.61–1.24)
Calcium: 10.1 mg/dL (ref 8.9–10.3)
Chloride: 93 mmol/L — ABNORMAL LOW (ref 101–111)
GFR calc Af Amer: >60 mL/min (ref >60)
GLUCOSE: 453 mg/dL — AB (ref 65–99)
POTASSIUM: 5.4 mmol/L — AB (ref 3.5–5.1)
Sodium: 129 mmol/L — ABNORMAL LOW (ref 135–145)
TOTAL PROTEIN: 8.3 g/dL — AB (ref 6.5–8.1)

## 2017-04-01 LAB — BASIC METABOLIC PANEL
ANION GAP: 14 (ref 5–15)
BUN: 20 mg/dL (ref 6–20)
CALCIUM: 8.4 mg/dL — AB (ref 8.9–10.3)
CO2: 13 mmol/L — ABNORMAL LOW (ref 22–32)
Chloride: 109 mmol/L (ref 101–111)
Creatinine, Ser: 1.26 mg/dL — ABNORMAL HIGH (ref 0.61–1.24)
GFR calc Af Amer: >60 mL/min (ref >60)
GLUCOSE: 196 mg/dL — AB (ref 65–99)
POTASSIUM: 4.9 mmol/L (ref 3.5–5.1)
SODIUM: 136 mmol/L (ref 135–145)

## 2017-04-01 LAB — MRSA PCR SCREENING: MRSA BY PCR: NEGATIVE

## 2017-04-01 LAB — URINALYSIS, ROUTINE W REFLEX MICROSCOPIC
Bacteria, UA: NONE SEEN
Bilirubin Urine: NEGATIVE
Glucose, UA: >=500 mg/dL — AB
Hgb urine dipstick: NEGATIVE
Ketones, ur: 80 mg/dL — AB
Leukocytes, UA: NEGATIVE
Nitrite: NEGATIVE
PH: 5 (ref 5.0–8.0)
Protein, ur: 30 mg/dL — AB
RBC / HPF: NONE SEEN RBC/hpf (ref 0–5)
SPECIFIC GRAVITY, URINE: 1.024 (ref 1.005–1.030)
Squamous Epithelial / LPF: NONE SEEN

## 2017-04-01 LAB — GLUCOSE, CAPILLARY
GLUCOSE-CAPILLARY: 293 mg/dL — AB (ref 65–99)
Glucose-Capillary: 173 mg/dL — ABNORMAL HIGH (ref 65–99)
Glucose-Capillary: 178 mg/dL — ABNORMAL HIGH (ref 65–99)
Glucose-Capillary: 197 mg/dL — ABNORMAL HIGH (ref 65–99)
Glucose-Capillary: 208 mg/dL — ABNORMAL HIGH (ref 65–99)
Glucose-Capillary: 217 mg/dL — ABNORMAL HIGH (ref 65–99)

## 2017-04-01 LAB — BLOOD GAS, ARTERIAL
ACID-BASE DEFICIT: 22.3 mmol/L — AB (ref 0.0–2.0)
BICARBONATE: 5.1 mmol/L — AB (ref 20.0–28.0)
Drawn by: 518061
FIO2: 21
O2 Saturation: 97.3 %
PATIENT TEMPERATURE: 98.6
PH ART: 7.162 — AB (ref 7.350–7.450)
PO2 ART: 133 mmHg — AB (ref 83.0–108.0)

## 2017-04-01 LAB — RAPID URINE DRUG SCREEN, HOSP PERFORMED
AMPHETAMINES: NOT DETECTED
BARBITURATES: NOT DETECTED
Benzodiazepines: NOT DETECTED
Cocaine: NOT DETECTED
Opiates: POSITIVE — AB
Tetrahydrocannabinol: NOT DETECTED

## 2017-04-01 LAB — LIPASE, BLOOD: Lipase: 18 U/L (ref 11–51)

## 2017-04-01 LAB — CBC
HEMATOCRIT: 48.9 % (ref 39.0–52.0)
Hemoglobin: 16.4 g/dL (ref 13.0–17.0)
MCH: 29.1 pg (ref 26.0–34.0)
MCHC: 33.5 g/dL (ref 30.0–36.0)
MCV: 86.9 fL (ref 78.0–100.0)
PLATELETS: 306 10*3/uL (ref 150–400)
RBC: 5.63 MIL/uL (ref 4.22–5.81)
RDW: 12.7 % (ref 11.5–15.5)
WBC: 13.4 10*3/uL — AB (ref 4.0–10.5)

## 2017-04-01 LAB — CBG MONITORING, ED
GLUCOSE-CAPILLARY: 473 mg/dL — AB (ref 65–99)
Glucose-Capillary: 466 mg/dL — ABNORMAL HIGH (ref 65–99)
Glucose-Capillary: 434 mg/dL — ABNORMAL HIGH (ref 65–99)
Glucose-Capillary: 300 mg/dL — ABNORMAL HIGH (ref 65–99)

## 2017-04-01 LAB — TROPONIN I

## 2017-04-01 LAB — INFLUENZA PANEL BY PCR (TYPE A & B)
INFLAPCR: NEGATIVE
INFLBPCR: NEGATIVE

## 2017-04-01 MED ORDER — INSULIN ASPART 100 UNIT/ML ~~LOC~~ SOLN: 0.0000 [IU] | Freq: Three times a day (TID) | SUBCUTANEOUS | Status: DC

## 2017-04-01 MED ORDER — DEXTROSE-NACL 5-0.45 % IV SOLN
INTRAVENOUS | Status: DC
Start: 2017-04-01 — End: 2017-04-03
  Administered 2017-04-01 – 2017-04-02 (×2): via INTRAVENOUS

## 2017-04-01 MED ORDER — SODIUM CHLORIDE 0.9 % IV BOLUS (SEPSIS)
1000.0000 mL | Freq: Once | INTRAVENOUS | Status: AC
  Administered 2017-04-01: 1000 mL via INTRAVENOUS

## 2017-04-01 MED ORDER — INSULIN DETEMIR 100 UNIT/ML ~~LOC~~ SOLN
20.0000 [IU] | SUBCUTANEOUS | Status: DC
  Filled 2017-04-01: qty 0.2

## 2017-04-01 MED ORDER — ACETAMINOPHEN 325 MG PO TABS
650.0000 mg | ORAL_TABLET | Freq: Four times a day (QID) | ORAL | Status: DC | PRN
  Administered 2017-04-01 – 2017-04-03 (×4): 650 mg via ORAL
  Filled 2017-04-01 (×4): qty 2

## 2017-04-01 MED ORDER — ENOXAPARIN SODIUM 40 MG/0.4ML ~~LOC~~ SOLN
40.0000 mg | SUBCUTANEOUS | Status: DC
  Administered 2017-04-01 – 2017-04-02 (×2): 40 mg via SUBCUTANEOUS
  Filled 2017-04-01 (×2): qty 0.4

## 2017-04-01 MED ORDER — KETOROLAC TROMETHAMINE 15 MG/ML IJ SOLN
15.0000 mg | Freq: Once | INTRAMUSCULAR | Status: AC
  Administered 2017-04-01: 15 mg via INTRAVENOUS
  Filled 2017-04-01: qty 1

## 2017-04-01 MED ORDER — SODIUM CHLORIDE 0.9 % IV SOLN
INTRAVENOUS | Status: DC
  Administered 2017-04-01: 18:00:00 via INTRAVENOUS

## 2017-04-01 MED ORDER — SODIUM CHLORIDE 0.9 % IV SOLN
INTRAVENOUS | Status: DC
  Administered 2017-04-01: 4.1 [IU]/h via INTRAVENOUS
  Filled 2017-04-01: qty 1

## 2017-04-01 MED ORDER — PROMETHAZINE HCL 25 MG/ML IJ SOLN
25.0000 mg | Freq: Once | INTRAMUSCULAR | Status: AC
Start: 2017-04-01 — End: 2017-04-01
  Administered 2017-04-01: 25 mg via INTRAVENOUS
  Filled 2017-04-01: qty 1

## 2017-04-01 MED ORDER — ONDANSETRON 4 MG PO TBDP
ORAL_TABLET | ORAL | Status: AC
  Administered 2017-04-01: 4 mg
  Filled 2017-04-01: qty 1

## 2017-04-01 MED ORDER — ONDANSETRON 4 MG PO TBDP: 4.0000 mg | ORAL_TABLET | Freq: Once | ORAL | Status: DC | PRN

## 2017-04-01 MED ORDER — INFLUENZA VAC SPLIT QUAD 0.5 ML IM SUSY
0.5000 mL | PREFILLED_SYRINGE | INTRAMUSCULAR | Status: AC
  Administered 2017-04-03: 0.5 mL via INTRAMUSCULAR
  Filled 2017-04-01: qty 0.5

## 2017-04-01 MED ORDER — SODIUM CHLORIDE 0.9 % IV SOLN: INTRAVENOUS | Status: DC

## 2017-04-01 MED ORDER — LABETALOL HCL 5 MG/ML IV SOLN
5.0000 mg | Freq: Once | INTRAVENOUS | Status: AC
  Administered 2017-04-01: 5 mg via INTRAVENOUS
  Filled 2017-04-01: qty 4

## 2017-04-01 MED ORDER — SODIUM CHLORIDE 0.9 % IV BOLUS (SEPSIS): 1000.0000 mL | Freq: Once | INTRAVENOUS | Status: DC

## 2017-04-01 MED ORDER — DEXTROSE-NACL 5-0.45 % IV SOLN: INTRAVENOUS | Status: DC

## 2017-04-01 MED ORDER — PANTOPRAZOLE SODIUM 40 MG IV SOLR
40.0000 mg | Freq: Once | INTRAVENOUS | Status: AC
  Administered 2017-04-01: 40 mg via INTRAVENOUS
  Filled 2017-04-01: qty 40

## 2017-04-01 MED ORDER — ONDANSETRON HCL 4 MG/2ML IJ SOLN
4.0000 mg | Freq: Once | INTRAMUSCULAR | Status: AC
  Administered 2017-04-01: 4 mg via INTRAVENOUS
  Filled 2017-04-01: qty 2

## 2017-04-01 NOTE — ED Notes (Signed)
This RN attempted for second IV access twice without success, Iv team consult ordered

## 2017-04-01 NOTE — Progress Notes (Signed)
MD notified of HR 120s and elevated BP 162/100. One time dose  IV labetalol ordered and administrated. Will continue to monitor.

## 2017-04-01 NOTE — ED Notes (Signed)
Iv team at bedside  

## 2017-04-01 NOTE — ED Notes (Signed)
Admitting provider at bedside.

## 2017-04-01 NOTE — ED Provider Notes (Signed)
[facility]-EMERGENCY DEPT Provider Note   CSN: [id number] Arrival date & time: 04/01/17  1121     History   Chief Complaint Chief Complaint  Patient presents with  . Emesis    HPI Rickey Smith is a 51 y.o. male.  Rickey Smith is a 51 y.o. Male who presents to the ED complaining of abdominal pain, nausea, vomiting for the past 4 days. He reports his symptoms first began with nausea and then he had vomiting. No diarrhea. He reports midabdominal pain. He has not been able to keep anything down. He reports he last had insulin last night. He has a history of medication non compliance.  He denies history of previous abdominal surgeries. He does have a history of DKA in the past. No treatments attempted prior to arrival. He denies fevers, hematemesis, hematochezia, diarrhea, chest pain, urinary symptoms, shortness of breath or rashes.   The history is provided by the patient, medical records and a parent. No language interpreter was used.  Emesis   Associated symptoms include abdominal pain. Pertinent negatives include no chills, no cough, no diarrhea, no fever and no headaches.    Past Medical History:  Diagnosis Date  . Diabetes mellitus without complication (HCC)   . Migraine     Patient Active Problem List   Diagnosis Date Noted  . DKA, type 2, not at goal [facility]) 12/28/2016  . Uncontrolled type 2 diabetes mellitus with hyperglycemia, with long-term current use of insulin (HCC) 12/28/2016  . Cocaine use 12/28/2016  . Diabetes mellitus due to underlying condition with hyperosmolarity without nonketotic hyperglycemic-hyperosmolar coma [facility]) (HCC) 06/27/2016  . Periodontal disease 05/02/2016  . Tobacco abuse 04/30/2016  . Polysubstance abuse (HCC) 04/30/2016  . Hyperglycemia 04/30/2016  . ARF (acute renal failure) (HCC) 04/23/2016  . Unresponsive episode 04/23/2016  . DKA, type 2 (HCC) 04/23/2016  . Sepsis (HCC) 08/12/2015  . Prostate abscess 08/11/2015  . Abscess  08/11/2015  . UTI (lower urinary tract infection) 08/11/2015  . Diabetic neuropathy (HCC) 08/10/2015  . Arterial hypotension   . Tachycardia 08/02/2015  . SIRS (systemic inflammatory response syndrome) (HCC) 08/02/2015  . GERD (gastroesophageal reflux disease) 10/26/2014  . Leukocytosis 10/23/2014  . Hyperkalemia 10/23/2014  . Hyperglycemia without ketosis 07/13/2014  . Diabetic hyperosmolar non-ketotic state (HCC) 07/13/2014  . Diabetes mellitus with nonketotic hyperosmolarity (HCC) 07/13/2014  . DKA (diabetic ketoacidoses) (HCC) 04/26/2014  . Chest pain, ROMI, DDX pericarditis 04/26/2014  . Ex-smoker 04/26/2014  . Hyponatremia 04/26/2014  . Metabolic acidosis 04/26/2014    Past Surgical History:  Procedure Laterality Date  . TRANSURETHRAL RESECTION OF PROSTATE N/A 08/16/2015   Procedure: TRANSURETHRAL RESECTION DRAINAGE OF PROSTATE ABCESS;  Surgeon: Barron Alvine, MD;  Location: [facility];  Service: Urology;  Laterality: N/A;       Home Medications    Prior to Admission medications   Medication Sig Start Date End Date Taking? Authorizing Provider  acetaminophen (TYLENOL) 325 MG tablet Take 650 mg by mouth every 6 (six) hours as needed for mild pain.   Yes [provider]  insulin aspart protamine- aspart (NOVOLOG MIX 70/30) (70-30) 100 UNIT/ML injection Inject 0.4 mLs (40 Units total) into the skin 2 (two) times daily with a meal. Patient taking differently: Inject 45 Units into the skin 2 (two) times daily with a meal.  04/24/16  Yes Osvaldo Shipper, MD    Family History Family History  Problem Relation Age of Onset  . Heart attack Unknown   . Prostate cancer Unknown   .  Hypertension Unknown   . Hypertension Mother   . Cancer Father     Social History Social History  Substance Use Topics  . Smoking status: Current Some Day Smoker    Packs/day: 0.50    Years: 20.00    Types: Cigarettes  . Smokeless tobacco: Never Used  . Alcohol use No     Allergies     Codeine and Gabapentin   Review of Systems Review of Systems  Constitutional: Negative for chills and fever.  HENT: Negative for congestion and sore throat.   Eyes: Negative for visual disturbance.  Respiratory: Negative for cough and shortness of breath.   Cardiovascular: Negative for chest pain.  Gastrointestinal: Positive for abdominal pain, nausea and vomiting. Negative for blood in stool and diarrhea.  Genitourinary: Negative for difficulty urinating and dysuria.  Musculoskeletal: Negative for back pain and neck pain.  Skin: Negative for rash.  Neurological: Negative for headaches.     Physical Exam Updated Vital Signs BP (!) 173/105   Pulse (!) 133   Temp 97.8 F (36.6 C) (Oral)   Resp (!) 29   Ht  (1.854 m)   Wt 72.6 kg (160 lb)   SpO2 99%   BMI 21.11 kg/m   Physical Exam  Constitutional: He is oriented to person, place, and time. He appears well-developed and well-nourished. No distress.  Appears uncomfortable.  HENT:  Head: Normocephalic and atraumatic.  Mouth/Throat: Oropharynx is clear and moist.  Eyes: Pupils are equal, round, and reactive to light. Conjunctivae are normal. Right eye exhibits no discharge. Left eye exhibits no discharge.  Neck: Neck supple.  Cardiovascular: Regular rhythm, normal heart sounds and intact distal pulses.  Exam reveals no gallop and no friction rub.   No murmur heard. Heart rate is 136 on my exam.  Pulmonary/Chest: Effort normal and breath sounds normal. No respiratory distress. He has no wheezes. He has no rales.  Lungs are clear to ascultation bilaterally. Symmetric chest expansion bilaterally. No increased work of breathing. No rales or rhonchi.  Slight tachypnea with respirations of 28.   Abdominal: Soft. Bowel sounds are normal. He exhibits no distension and no mass. There is no tenderness. There is no rebound and no guarding.  Abdomen is soft and nontender to palpation. Bowel sounds are present. No peritoneal signs.   Musculoskeletal: He exhibits no edema.  Lymphadenopathy:    He has no cervical adenopathy.  Neurological: He is alert and oriented to person, place, and time. Coordination normal.  Skin: Skin is warm and dry. Capillary refill takes less than 2 seconds. No rash noted. He is not diaphoretic. No erythema. No pallor.  Psychiatric: He has a normal mood and affect. His behavior is normal.  Nursing note and vitals reviewed.    ED Treatments / Results  Labs (all labs ordered are listed, but only abnormal results are displayed) Labs Reviewed  COMPREHENSIVE METABOLIC PANEL - Abnormal; Notable for the following:       Result Value   Sodium 129 (*)    Potassium 5.4 (*)    Chloride 93 (*)    CO2 9 (*)    Glucose, Bld 453 (*)    Creatinine, Ser 1.31 (*)    Total Protein 8.3 (*)    AST 13 (*)    Total Bilirubin 1.5 (*)    Anion gap 27 (*)    All other components within normal limits  CBC - Abnormal; Notable for the following:    WBC 13.4 (*)  All other components within normal limits  URINALYSIS, ROUTINE W REFLEX MICROSCOPIC - Abnormal; Notable for the following:    Color, Urine STRAW (*)    Glucose, UA >=500 (*)    Ketones, ur 80 (*)    Protein, ur 30 (*)    All other components within normal limits  BLOOD GAS, ARTERIAL - Abnormal; Notable for the following:    pH, Arterial 7.162 (*)    pO2, Arterial 133 (*)    Bicarbonate 5.1 (*)    Acid-base deficit 22.3 (*)    All other components within normal limits  RAPID URINE DRUG SCREEN, HOSP PERFORMED - Abnormal; Notable for the following:    Opiates POSITIVE (*)    All other components within normal limits  GLUCOSE, CAPILLARY - Abnormal; Notable for the following:    Glucose-Capillary 293 (*)    All other components within normal limits  GLUCOSE, CAPILLARY - Abnormal; Notable for the following:    Glucose-Capillary 217 (*)    All other components within normal limits  CBG MONITORING, ED - Abnormal; Notable for the following:     Glucose-Capillary 466 (*)    All other components within normal limits  CBG MONITORING, ED - Abnormal; Notable for the following:    Glucose-Capillary 473 (*)    All other components within normal limits  CBG MONITORING, ED - Abnormal; Notable for the following:    Glucose-Capillary 434 (*)    All other components within normal limits  CBG MONITORING, ED - Abnormal; Notable for the following:    Glucose-Capillary 300 (*)    All other components within normal limits  MRSA PCR SCREENING  LIPASE, BLOOD  INFLUENZA PANEL BY PCR (TYPE A & B)  CBC  TROPONIN I  TROPONIN I  TROPONIN I  BASIC METABOLIC PANEL  BASIC METABOLIC PANEL  BASIC METABOLIC PANEL  BASIC METABOLIC PANEL    EKG  EKG Interpretation  Date/Time:  Tuesday April 01 2017 14:26:39 EDT Ventricular Rate:  139 PR Interval:    QRS Duration: 93 QT Interval:  278 QTC Calculation: 423 R Axis:   84 Text Interpretation:  Sinus tachycardia Multiple premature complexes, vent & supraven Aberrant complex LAE, consider biatrial enlargement Minimal ST depression, inferior leads Confirmed by Tilden Fossa [phone]) on 04/01/2017 2:35:10 PM       Radiology No results found.  Procedures Procedures (including critical care time)  CRITICAL CARE Performed by: Lawana Chambers   Total critical care time: 45 minutes  Critical care time was exclusive of separately billable procedures and treating other patients.  Critical care was necessary to treat or prevent imminent or life-threatening deterioration.  Critical care was time spent personally by me on the following activities: development of treatment plan with patient and/or surrogate as well as nursing, discussions with consultants, evaluation of patient's response to treatment, examination of patient, obtaining history from patient or surrogate, ordering and performing treatments and interventions, ordering and review of laboratory studies, ordering and review of  radiographic studies, pulse oximetry and re-evaluation of patient's condition.   Medications Ordered in ED Medications  insulin regular (NOVOLIN R,HUMULIN R) 100 Units in sodium chloride 0.9 % 100 mL (1 Units/mL) infusion (4.7 Units/hr Intravenous Rate/Dose Change 04/01/17 1929)  sodium chloride 0.9 % bolus 1,000 mL (0 mLs Intravenous Stopped 04/01/17 1601)    And  sodium chloride 0.9 % bolus 1,000 mL (0 mLs Intravenous Stopped 04/01/17 1726)    And  0.9 %  sodium chloride infusion ( Intravenous New Bag/Given 04/01/17  1730)  enoxaparin (LOVENOX) injection 40 mg (not administered)  dextrose 5 %-0.45 % sodium chloride infusion (not administered)  0.9 %  sodium chloride infusion (not administered)  Influenza vac split quadrivalent PF (FLUARIX) injection 0.5 mL (not administered)  ondansetron (ZOFRAN-ODT) 4 MG disintegrating tablet (4 mg  Given 04/01/17 1246)  ondansetron (ZOFRAN) injection 4 mg (4 mg Intravenous Given 04/01/17 1504)  promethazine (PHENERGAN) injection 25 mg (25 mg Intravenous Given 04/01/17 1601)  sodium chloride 0.9 % bolus 1,000 mL (0 mLs Intravenous Stopped 04/01/17 1727)  pantoprazole (PROTONIX) injection 40 mg (40 mg Intravenous Given 04/01/17 1836)  ketorolac (TORADOL) 15 MG/ML injection 15 mg (15 mg Intravenous Given 04/01/17 1836)     Initial Impression / Assessment and Plan / ED Course  I have reviewed the triage vital signs and the nursing notes.  Pertinent labs & imaging results that were available during my care of the patient were reviewed by me and considered in my medical decision making (see chart for details).     This is a 51 y.o. Male who presents to the ED complaining of abdominal pain, nausea, vomiting for the past 4 days. He reports his symptoms first began with nausea and then he had vomiting. No diarrhea. He reports midabdominal pain. He has not been able to keep anything down. He reports he last had insulin last night. He has a history of medication non  compliance.  He denies history of previous abdominal surgeries. On exam patient's abdomen is soft and nontender to palpation. He is retching in the room. Fingerstick glucose is 466. He is tachycardic with a HR of 136.  CMP reveals an acute kidney injury with a creatinine of 1.31. Sodium of 129, potassium of 5.4, glucose of 453, and an anion gap of 27. Lipase is normal. Patient immediately started on glucose stabilizer and provided with fluid bolus.  ABG reveals an arterial pH of 7.162. Bicarbonate is 5.1. ABG was obtained after almost two liter fluid bolus.  At reevaluation patient is no longer vomiting. Plan for admission. Patient agrees with plan.   I consulted with Internal medicine teaching service who accepted the patient for admission.   This patient was discussed with and evaluated by Dr. Madilyn Hook who agrees with assessment and plan.   Final Clinical Impressions(s) / ED Diagnoses   Final diagnoses:  Non-intractable vomiting with nausea, unspecified vomiting type  Diabetic ketoacidosis without coma associated with type 2 diabetes mellitus [facility])    New Prescriptions Current Discharge Medication List       Ikey, Omary, Cordelia Poche 04/01/17 1934    Tilden Fossa, MD 04/02/17 [phone]

## 2017-04-01 NOTE — H&P (Signed)
Date: 04/01/2017               Patient Name:  Rickey Smith MRN: [id number]  DOB: [birth year] Age / Sex: 51 y.o., male   PCP: Patient, No Pcp Per              Medical Service: Internal Medicine Teaching Service              Attending Physician: Dr. Levert Feinstein, MD    First Contact: Nida Boatman, MS 4 Pager: [phone]  Second Contact: Dr. Samuella Cota Pager: [phone]            After Hours (After 5p/  First Contact Pager: [phone]  weekends / holidays): Second Contact Pager: [phone]   Chief Complaint: abdominal pain/vomiting  History of Present Illness: Pt is a 51 yo M with a history of T1DM, largely noncompliant with insulin therapy, who presents with five days of abdominal pain, nausea, and vomiting.  Pt was in his normal state of health prior to Friday when he developed abdominal pain, nausea, and vomiting that has persisted for the past 5 days.  Pt describes the abdominal pain as a "stabbing, burning pain".  He has vomited multiple times per day, stating he's vomited over a dozen times in the past 24 hours.  Emesis is nonbloody.  He is unable to keep any liquids or solids down due to his nausea.  He is also complaining of a headache that began yesterday and has gotten worse in the past day, which he now describes as a "migraine".  He endorses photophobia but is no phonophobia.  He took Tylenol without relief.  Pt denies any recent illness or sick contacts.  He did have a tooth extraction a week and a half ago, but denies any dental pain since that procedure.  No new tattoos.  He does endorse polyuria, a sore throat, and chills. He denies CP, SOB, cough, neck stiffness, dysuria, diarrhea or constipation.  Last BM was Sunday and nonbloody.       Pt states he is compliant with Novolog and takes 45 units in the morning and 40 units and night.  He doesn't believe he is using expired Novolog but can't be sure of it.  He says he checks his blood sugars every morning and they run from 110-130.   Yesterday morning his fasting glucose was 140 at home.  He does not have a PCP and can't remember the last time he saw an OP provider for diabetes management.  He lives at home alone.  He denies drinking or drug use, stating he last used cocaine 6-8 months ago.   Meds: Current Facility-Administered Medications  Medication Dose Route Frequency Provider Last Rate Last Dose  . 0.9 %  sodium chloride infusion   Intravenous Continuous Blaize, Epple, PA-C 125 mL/hr at 04/01/17 1730    . dextrose 5 %-0.45 % sodium chloride infusion   Intravenous Continuous Everlene Farrier, PA-C      . insulin regular (NOVOLIN R,HUMULIN R) 100 Units in sodium chloride 0.9 % 100 mL (1 Units/mL) infusion   Intravenous Continuous Everlene Farrier, PA-C 4.8 mL/hr at 04/01/17 1726 4.8 Units/hr at 04/01/17 1726  . ketorolac (TORADOL) 15 MG/ML injection 15 mg  15 mg Intravenous Once Nyra Market, MD      . pantoprazole (PROTONIX) injection 40 mg  40 mg Intravenous Once Nyra Market, MD       Current Outpatient Prescriptions  Medication Sig Dispense Refill  . acetaminophen (TYLENOL) 325  MG tablet Take 650 mg by mouth every 6 (six) hours as needed for mild pain.    Marland Kitchen insulin aspart protamine- aspart (NOVOLOG MIX 70/30) (70-30) 100 UNIT/ML injection Inject 0.4 mLs (40 Units total) into the skin 2 (two) times daily with a meal. (Patient taking differently: Inject 45 Units into the skin 2 (two) times daily with a meal. ) 10 mL 11  . amitriptyline (ELAVIL) 75 MG tablet Take 1 tablet (75 mg total) by mouth at bedtime. (Patient not taking: Reported on 04/01/2017) 30 tablet 2    Allergies: Allergies as of 04/01/2017 - Review Complete 04/01/2017  Allergen Reaction Noted  . Codeine Itching 01/24/2013  . Gabapentin Diarrhea and Nausea And Vomiting 07/31/2014   Past Medical History:  Diagnosis Date  . Diabetes mellitus without complication (HCC)   . Migraine    Past Surgical History:  Procedure Laterality Date  .  TRANSURETHRAL RESECTION OF PROSTATE N/A 08/16/2015   Procedure: TRANSURETHRAL RESECTION DRAINAGE OF PROSTATE ABCESS;  Surgeon: Barron Alvine, MD;  Location: [facility];  Service: Urology;  Laterality: N/A;   Family History  Problem Relation Age of Onset  . Heart attack Unknown   . Prostate cancer Unknown   . Hypertension Unknown   . Hypertension Mother   . Cancer Father    Social History   Social History  . Marital status: Single    Spouse name: N/A  . Number of children: N/A  . Years of education: N/A   Occupational History  . Not on file.   Social History Main Topics  . Smoking status: Current Some Day Smoker    Packs/day: 0.50    Years: 20.00    Types: Cigarettes  . Smokeless tobacco: Never Used  . Alcohol use No  . Drug use: Yes    Types: Cocaine, Other-see comments     Comment: patient denies 12/27/2016  . Sexual activity: Not on file   Other Topics Concern  . 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    Review of Systems:  Pertinent items are noted in HPI.  Physical Exam: Blood pressure (!) 173/105, pulse (!) 133, temperature 97.8 F (36.6 C), temperature source Oral, resp. rate (!) 29, height  (1.854 m), weight 72.6 kg (160 lb), SpO2 99 %. General: Thin man lying uncomfortably in bed.   HEENT: Head atraumatic.  PERRLA.  No tonsillar erythema or exudates.  No sign of dental abscesses or infection; largely edentulous.  No neck stiffness or lymphadenopathy.   Respiratory: Increased WOB.  Lungs CTAB - no wheezing or rhonchi.  CV: Tachycardic.  Regular rhythm.  No murmurs appreciated. 2+ radial and DP pulses.  Abdomen: Normoactive bowel sounds. Soft, NTND.  No masses appreciated.  Extremities: Warm, dry.  No lower extremity edema.  2+ radial and DP pulses. Onychomycosis bilaterally. Skin: Tattoos  on all four extremities.  No rashes or erythema. Neuro: AOx3.  CN II-XII intact.  Normal strength and sensation in upper and lower extremities bilaterally.   Lab results: CMP Latest Ref Rng & Units 04/01/2017  Glucose 65 - 99 mg/dL 045(W)  BUN 6 - 20 mg/dL 19  Creatinine 0.98 - 1.19 mg/dL 1.47(W)  Sodium 295 - 621 mmol/L 129(L)  Potassium 3.5 - 5.1 mmol/L 5.4(H)  Chloride 101 -  111 mmol/L 93(L)  CO2 22 - 32 mmol/L 9(L)  Calcium 8.9 - 10.3 mg/dL 16.1  Total Protein 6.5 - 8.1 g/dL 8.3(H)  Total Bilirubin 0.3 - 1.2 mg/dL 0.9(U)  Alkaline Phos 38 - 126 U/L 87  AST 15 - 41 U/L 13(L)  ALT 17 - 63 U/L 18   Arterial Blood Gas result:  pO2 133; pCO2 >120 (likely inaccurate); pH 7.162;  HCO3 5.1, %O2 Sat 97.3.  CBC    Component Value Date/Time   WBC 13.4 (H) 04/01/2017 1130   RBC 5.63 04/01/2017 1130   HGB 16.4 04/01/2017 1130   HCT 48.9 04/01/2017 1130   PLT 306 04/01/2017 1130   MCV 86.9 04/01/2017 1130   MCH 29.1 04/01/2017 1130   MCHC 33.5 04/01/2017 1130   RDW 12.7 04/01/2017 1130   LYMPHSABS 1.4 12/27/2016 1755   MONOABS 1.5 (H) 12/27/2016 1755   EOSABS 0.0 12/27/2016 1755   BASOSABS 0.0 12/27/2016 1755   UA: glucose > 500; ketones 80; protein 30; no leukocytes, nitrites or WBCs  CBG (last 3)   Recent Labs  04/01/17 1511 04/01/17 1613 04/01/17 1725  GLUCAP 473* 434* 300*    Imaging results:  None  Other results: EKG - personally reviewed by Dr. Samuella Cota - sinus tachycardia, no apparent q waves or ST/T wave ischemic changes  Assessment & Plan by Problem: Active Problems:   DKA (diabetic ketoacidoses) (HCC)  Diabetic ketoacidosis: Pt with T1DM who presents to ED with five days of nausea, vomiting, and abdominal pain and found to have CBG of 466 in ED.  BMP with AG of 27, blood pH 7.162, UA with ketones and large amount of glucose.  Pt has a history of multiple admissions for DKA and has questionable compliance with Novolog at home.  Pt denies any recent illnesses  or sick contacts although he did have a tooth extraction a week in a half ago and is now complaining of a sore throat and chills.  He did have a mild leukocytosis of 13.4 so will get influenza panel and repeat CBC.  No signs or symptoms of pneumonia so will hold off on CXR for now.  Will also order and trend troponins.  UDS ordered given history of drug use, although pt denies any recent use.  Pt received 3L boluses of NS in the ED and is currently on an insulin drip with NS running at 125 mL/hr.  Will transition to D51/2NS and subQ insulin once CBG <250.  BMP ordered every 4 hours - will trend Na, K and replete K as necessary.  Pt's last corrected Na was 136.  His Cr is 1.3, up from his baseline of 0.8-1.  Pt will be admitted to stepdown unit with cardiac monitoring.  Will provide labetalol if pt remains hypertensive after receiving toradol for headache. -cardiac monitoring on stepdown floor -Insulin gtt until CBG < 250, then transition to SubQ Levemir 7units (based on pt's weight).   -NS running @ 125 mL/hr.  When CBG <250, can switch to D5 1/2NS at 141mL/hr  -BMP q4h.  Follow AG and trend Na, K. Replete K PRN.  -CBC this evening and tomorrow AM   -F/u and trend troponin   -F/u influenza panel -Toradol for headache  -Labetalol it pt remains hypertensive after toradol -Protonix, Phenergan for nausea      T1DM: Patient with no f/u for his T1DM with frequent admissions for DKA. He obtains his insulin OTC at [facility]. Will try to set him up with  f/u for better management of his diabetes.  Diet: NPO  PPX: Lovenox  Code: Full  Dispo: Possible discharge 1-2 days.  This is a Psychologist, occupational Note.  The care of the patient was discussed with Dr. Samuella Cota and the assessment and plan was formulated with their assistance.  Please see their note for official documentation of the patient encounter.   Signed: Nathaneil Canary, Medical Student 04/01/2017, 6:12 PM   Attestation for Student Documentation:  I  personally was present and performed or re-performed the history, physical exam and medical decision-making activities of this service and have verified that the service and findings are accurately documented in the student's note.  Nyra Market, MD 04/01/2017, 8:02 PM

## 2017-04-01 NOTE — ED Notes (Signed)
CBG 466; RN notified

## 2017-04-01 NOTE — ED Notes (Signed)
Pt just now brought back to room E39

## 2017-04-01 NOTE — ED Triage Notes (Signed)
N/v/abd pain x 4 days

## 2017-04-02 ENCOUNTER — Encounter (HOSPITAL_COMMUNITY): Payer: Self-pay | Admitting: *Deleted

## 2017-04-02 LAB — BASIC METABOLIC PANEL
Anion gap: 6 (ref 5–15)
Anion gap: 9 (ref 5–15)
Anion gap: 11 (ref 5–15)
BUN: 19 mg/dL (ref 6–20)
BUN: 19 mg/dL (ref 6–20)
BUN: 13 mg/dL (ref 6–20)
CALCIUM: 8.2 mg/dL — AB (ref 8.9–10.3)
CALCIUM: 8.2 mg/dL — AB (ref 8.9–10.3)
CHLORIDE: 111 mmol/L (ref 101–111)
CO2: 17 mmol/L — ABNORMAL LOW (ref 22–32)
CO2: 19 mmol/L — ABNORMAL LOW (ref 22–32)
CO2: 16 mmol/L — ABNORMAL LOW (ref 22–32)
CREATININE: 0.9 mg/dL (ref 0.61–1.24)
CREATININE: 0.9 mg/dL (ref 0.61–1.24)
CREATININE: 0.73 mg/dL (ref 0.61–1.24)
Calcium: 8.3 mg/dL — ABNORMAL LOW (ref 8.9–10.3)
Chloride: 107 mmol/L (ref 101–111)
Chloride: 104 mmol/L (ref 101–111)
GFR calc Af Amer: >60 mL/min (ref >60)
GFR calc non Af Amer: >60 mL/min (ref >60)
GFR calc non Af Amer: >60 mL/min (ref >60)
Glucose, Bld: 159 mg/dL — ABNORMAL HIGH (ref 65–99)
Glucose, Bld: 133 mg/dL — ABNORMAL HIGH (ref 65–99)
Glucose, Bld: 229 mg/dL — ABNORMAL HIGH (ref 65–99)
Potassium: 4 mmol/L (ref 3.5–5.1)
Potassium: 3.9 mmol/L (ref 3.5–5.1)
Potassium: 3.9 mmol/L (ref 3.5–5.1)
SODIUM: 134 mmol/L — AB (ref 135–145)
SODIUM: 135 mmol/L (ref 135–145)
SODIUM: 131 mmol/L — AB (ref 135–145)

## 2017-04-02 LAB — GLUCOSE, CAPILLARY
GLUCOSE-CAPILLARY: 136 mg/dL — AB (ref 65–99)
GLUCOSE-CAPILLARY: 140 mg/dL — AB (ref 65–99)
GLUCOSE-CAPILLARY: 158 mg/dL — AB (ref 65–99)
GLUCOSE-CAPILLARY: 189 mg/dL — AB (ref 65–99)
GLUCOSE-CAPILLARY: 212 mg/dL — AB (ref 65–99)
GLUCOSE-CAPILLARY: 284 mg/dL — AB (ref 65–99)
Glucose-Capillary: 157 mg/dL — ABNORMAL HIGH (ref 65–99)
Glucose-Capillary: 198 mg/dL — ABNORMAL HIGH (ref 65–99)
Glucose-Capillary: 233 mg/dL — ABNORMAL HIGH (ref 65–99)

## 2017-04-02 LAB — CBC
HEMATOCRIT: 35.7 % — AB (ref 39.0–52.0)
Hemoglobin: 12.5 g/dL — ABNORMAL LOW (ref 13.0–17.0)
MCH: 29.3 pg (ref 26.0–34.0)
MCHC: 35 g/dL (ref 30.0–36.0)
MCV: 83.8 fL (ref 78.0–100.0)
PLATELETS: 243 10*3/uL (ref 150–400)
RBC: 4.26 MIL/uL (ref 4.22–5.81)
RDW: 13.1 % (ref 11.5–15.5)
WBC: 18.3 10*3/uL — ABNORMAL HIGH (ref 4.0–10.5)

## 2017-04-02 LAB — TROPONIN I: Troponin I: <0.03 ng/mL (ref <0.03)

## 2017-04-02 MED ORDER — LISINOPRIL 10 MG PO TABS
10.0000 mg | ORAL_TABLET | Freq: Every day | ORAL | Status: DC
  Administered 2017-04-02 – 2017-04-03 (×2): 10 mg via ORAL
  Filled 2017-04-02 (×2): qty 1

## 2017-04-02 MED ORDER — INSULIN DETEMIR 100 UNIT/ML ~~LOC~~ SOLN
20.0000 [IU] | SUBCUTANEOUS | Status: DC
  Administered 2017-04-02 (×2): 20 [IU] via SUBCUTANEOUS
  Filled 2017-04-02 (×3): qty 0.2

## 2017-04-02 MED ORDER — KETOROLAC TROMETHAMINE 15 MG/ML IJ SOLN
15.0000 mg | Freq: Once | INTRAMUSCULAR | Status: AC
  Administered 2017-04-02: 15 mg via INTRAVENOUS
  Filled 2017-04-02: qty 1

## 2017-04-02 MED ORDER — ONDANSETRON 4 MG PO TBDP
4.0000 mg | ORAL_TABLET | Freq: Three times a day (TID) | ORAL | Status: DC | PRN
  Administered 2017-04-02: 4 mg via ORAL
  Filled 2017-04-02: qty 1

## 2017-04-02 MED ORDER — PANTOPRAZOLE SODIUM 40 MG PO TBEC
40.0000 mg | DELAYED_RELEASE_TABLET | Freq: Every day | ORAL | Status: DC
  Administered 2017-04-02 – 2017-04-03 (×2): 40 mg via ORAL
  Filled 2017-04-02 (×2): qty 1

## 2017-04-02 MED ORDER — INSULIN ASPART 100 UNIT/ML ~~LOC~~ SOLN
0.0000 [IU] | Freq: Three times a day (TID) | SUBCUTANEOUS | Status: DC
  Administered 2017-04-02: 5 [IU] via SUBCUTANEOUS
  Administered 2017-04-02: 3 [IU] via SUBCUTANEOUS
  Administered 2017-04-02: 5 [IU] via SUBCUTANEOUS
  Administered 2017-04-03: 8 [IU] via SUBCUTANEOUS
  Administered 2017-04-03: 5 [IU] via SUBCUTANEOUS

## 2017-04-02 MED ORDER — GI COCKTAIL ~~LOC~~
30.0000 mL | Freq: Two times a day (BID) | ORAL | Status: DC
  Administered 2017-04-02 – 2017-04-03 (×2): 30 mL via ORAL
  Filled 2017-04-02 (×3): qty 30

## 2017-04-02 NOTE — Discharge Summary (Signed)
Name: Rickey Smith MRN: [id number] DOB: [birth year] 51 y.o. PCP: Patient, No Pcp Rickey  Date of Admission: 04/01/2017  1:46 PM Date of Discharge: 04/03/2017 Attending Physician: Levert Feinstein, MD  Discharge Diagnosis: 1. DKA, now resolved 2. Type 1 DM 3. HTN   Discharge Medications: Allergies as of 04/03/2017      Reactions   Codeine Itching   Gabapentin Diarrhea, Nausea And Vomiting      Medication List    TAKE these medications   acetaminophen 325 MG tablet Commonly known as:  TYLENOL Take 650 mg by mouth every 6 (six) hours as needed for mild pain.   insulin aspart protamine- aspart (70-30) 100 UNIT/ML injection Commonly known as:  NOVOLOG MIX 70/30 Inject 0.4 mLs (40 Units total) into the skin 2 (two) times daily with a meal. What changed:  how much to take   lisinopril 5 MG tablet Commonly known as:  PRINIVIL,ZESTRIL Take 2 tablets (10 mg total) by mouth daily.   ondansetron 4 MG disintegrating tablet Commonly known as:  ZOFRAN-ODT Take 1 tablet (4 mg total) by mouth every 8 (eight) hours as needed for nausea or vomiting.   pantoprazole 40 MG tablet Commonly known as:  PROTONIX Take 1 tablet (40 mg total) by mouth daily.       Disposition and follow-up:   Mr.Rickey Smith was discharged from [facility] in Stable condition.  At the hospital follow up visit please address:  Uncontrolled T1DM, DKA:   -Novolog 70/30 40u BID, assess for need for further adjustment  -Check blood glucose at night and before meals -Repeat HbA1c  HTN:  -lisinopril  daily -assess need for further medication titration  2.  Labs / imaging needed at time of follow-up: HbA1c  3.  Pending labs/ test needing follow-up:  None   Follow-up Appointments:  [facility] booked through next week.  He should call [facility] [phone]) on Monday morning to schedule an appointment.   Hospital Course by problem list:  DKA, T1DM:  Pt is a  known T1DM with a history of multiple admissions for DKA who presented to the ED with five days of nausea, vomiting, and abdominal pain and found to have CBG of 466 in ED.  BMP with AG of 27, blood pH 7.162, UA with ketones and large amount of glucose.  Pt denied any recent illnesses or sick contacts although he did have a tooth extraction a week and a half ago and complained of a sore throat and chills.  He did have a mild leukocytosis of 13.4 in the ED but influenza panel and MRSA screen were negative.  Troponins negative.  UDS positive for opioids, for which pt admits to taking a friend's Percocet for tooth pain.  Pt received toradol for a headache and labetalol  one time for hypertensive.  For DKA management pt was started on insulin gtt with NS in the ED and transitioned to subq lantus once his anion gap had adequately closed x2.  He was unable to fully tolerate meals until hospital day 2 due to abdominal pain; his gap never reopened. He was discharged on pantoprazole for his abdominal pain which seemed helpful while inpatient. He was discharged on home Novolog 70/30 40u BID and would benefit from further diabetes education as an outpatient.     Hypertension: Patient hypertensive on admission in setting of DKA and headache; after adequate management of his DKA, he still remained mildly hypertensive in 150s/100 so  was started on low dose lisinopril  daily with improvement of his blood pressure at discharge. He did not have ischemic symptoms, EKG was unremarkable for ischemic changes, and troponins remained negative.  Discharge Vitals:   BP 131/76 (BP Location: Left Arm)   Pulse 93   Temp 98.6 F (37 C) (Oral)   Resp 20   Ht  (1.854 m)   Wt 160 lb (72.6 kg)   SpO2 96%   BMI 21.11 kg/m   Pertinent Labs, Studies, and Procedures:  Admission labs CMP Latest Ref Rng & Units 04/01/2017  Glucose 65 - 99 mg/dL 914(N)  BUN 6 - 20 mg/dL 19  Creatinine 8.29 - 5.62 mg/dL 1.30(Q)  Sodium 657 -  145 mmol/L 129(L)  Potassium 3.5 - 5.1 mmol/L 5.4(H)  Chloride 101 - 111 mmol/L 93(L)  CO2 22 - 32 mmol/L 9(L)  Calcium 8.9 - 10.3 mg/dL 84.6  Total Protein 6.5 - 8.1 g/dL 8.3(H)  Total Bilirubin 0.3 - 1.2 mg/dL 9.6(E)  Alkaline Phos 38 - 126 U/L 87  AST 15 - 41 U/L 13(L)  ALT 17 - 63 U/L 18  Arterial Blood Gas result:  pH 7.162; UA: glucose > 500; ketones 80; protein 30; no leukocytes, nitrites or WBCs Troponin: negative Flu panel: negative UDS: +opioids  BMP Latest Ref Rng & Units 04/03/2017 04/02/2017 04/02/2017  Glucose 65 - 99 mg/dL 952(W) 413(K) 440(N)  BUN 6 - 20 mg/dL Creatinine 0.61 - 1.24 mg/dL 0.27(O) 5.36 6.44  Sodium 135 - 145 mmol/L 130(L) 131(L) 135  Potassium 3.5 - 5.1 mmol/L 3.7 3.9 3.9  Chloride 101 - 111 mmol/L 100(L) 104 107  CO2 22 - 32 mmol/L 22 16(L) 19(L)  Calcium 8.9 - 10.3 mg/dL 8.2(L) 8.2(L) 8.3(L)   CBC Latest Ref Rng & Units 04/02/2017 04/01/2017  WBC 4.0 - 10.5 K/uL 18.3(H) 13.4(H)  Hemoglobin 13.0 - 17.0 g/dL 12.5(L) 16.4  Hematocrit 39.0 - 52.0 % 35.7(L) 48.9  Platelets 150 - 400 K/uL 243 306      Discharge Instructions:   You were seen in the hospital after an episode of very high blood sugar (also known as diabetic ketoacidosis or DKA) that was associated with nausea and vomiting.  It is important that you continue to take your insulin as prescribed and follow-up with the [facility].  Please continue taking the following medications at discharge: -Novolog 70/30 45units in the morning and at night  Check your blood sugar every morning and with meals, and keep a record of the readings to bring to your doctor.  Please call the [facility] Monday morning (10/15) to schedule a follow-up appointment for your diabetes.  Their phone number is [phone] Return to the hospital if your blood sugar is ever greater than 450, if you develop any new fevers, chest pain, or shortness of breath, or your nausea and  vomiting returns.   Discharge Instructions    Call MD for:  difficulty breathing, headache or visual disturbances    Complete by:  As directed    Call MD for:  extreme fatigue    Complete by:  As directed    Call MD for:  hives    Complete by:  As directed    Call MD for:  persistant dizziness or light-headedness    Complete by:  As directed    Call MD for:  persistant nausea and vomiting    Complete by:  As directed  Call MD for:  redness, tenderness, or signs of infection (pain, swelling, redness, odor or green/yellow discharge around incision site)    Complete by:  As directed    Call MD for:  severe uncontrolled pain    Complete by:  As directed    Call MD for:  temperature >100.4    Complete by:  As directed    Diet - low sodium heart healthy    Complete by:  As directed    Discharge instructions    Complete by:  As directed    For your diabetes, I have re-prescribed your insulin which you can pick up at [facility] today.  For your abd pain and nausea, I have prescribed zofran under the tongue that you can use every 8 hours if needed; I have also prescribed pantoprazole (an acid reducer pill), that you can take 30 minutes before your first meal of the day while your stomach is recovering.   Your blood pressure was elevated during this hospitalization, so we have started a medicine called lisinopril, that you'll take  (one pill), once a day.   Please follow up with [facility] so they can recheck and adjust your diabetes medicines, and recheck your blood pressure control. Please call them on Monday at 8:30-9 am to schedule an appointment to be seen in 1-2 weeks.   Increase activity slowly    Complete by:  As directed       Signed:  Nyra Market, MD 04/03/2017, 4:26 PM

## 2017-04-02 NOTE — Progress Notes (Signed)
Subjective: Pt was interviewed while lying comfortably in bed this AM.  He continues to complain of a sore throat, but says his headache, nausea, vomiting, and abdominal pain have resolved.  Denies any SOB or CP.  No fevers or chills.  Denies any dental pain, although he does reiterate that he had a tooth pulled a week ago and these symptoms began a few days following the dental procedure.  He did have some localized swelling following the procedure, but no signs or symptoms of infection.  He states that there are no problems at home, and when asked about UDS positive for opioids, he says he took a friend's Percocet several days ago to help with dental pain.  He says he previously was seen at a PCP [facility].  He is currently off the insulin drip and will trial PO intake with breakfast this morning.    Objective: Vital signs in last 24 hours: Vitals:   04/02/17 0300 04/02/17 0400 04/02/17 0700 04/02/17 0835  BP: (!) 121/56 134/76 (!) 105/52   Pulse: 93 85 87   Resp: Temp: 98.4 F (36.9 C)   99.1 F (37.3 C)  TempSrc: Oral   Oral  SpO2: 98% 99% 97%   Weight:      Height:       General: Thin man lying comfortably in bed.  Calm and cooperative.  HEENT: PERRLA.  EOM intact.  No tonsillar erythema or exudates.  No sign of dental abscesses or infection. Edentulous.   Respiratory: Normal WOB.  Lungs CTAB - no wheezing or rhonchi.  CV: RRR.  No murmurs appreciated. 2+ radial  pulses.  Abdomen: Soft, NTND.   Extremities: Warm, dry.  No lower extremity edema. Onychomycosis bilaterally. Skin: Tattoos on all four extremities.  No rashes or erythema. Neuro: AOx3. Moves all 4 extremities spontaneously.    Assessment/Plan: Active Problems:   DKA (diabetic ketoacidoses) (HCC)  Diabetic ketoacidosis, resolved: Pt presented to ED with five days of nausea, vomiting, and abdominal pain and found to have CBG of 466, pH 7.16, BMP with AG of 27, and UA with ketones and  glucose.  No systemic signs of infection, although he had a mild leukocytosis of 13.4.  Pt was started on insulin gtt with NS in the ED.  Overnight his AG closed and CBG <250 so insulin gtt was stopped and he was started on subQ insulin, Levemir 20u.  NS was switched to D51/2NS @ 100 mL/hr.  BMP today within normal limits, with CBC demonstrating increasing WBC count of 18.3.  Flu panel and  MRSA screen negative.  Troponins negative.  UDS positive for opioids.  We encouraged pt to trial PO intake today and will have DM educators see him in the hospital.  He will need close follow-up at discharge.  He did require labetalol  overnight for hypertension with good response; pt's blood pressure has since been well-controlled.       -cardiac monitoring on stepdown floor  -Will continue D51/2NS until pt able to tolerate PO intake -Levemir 20u qHS with SSI-m with meals -Diabetes educator consult placed  -CBC and BMP tomorrow AM   -Protonix, Phenergan for nausea        T1DM: Patient with no f/u for his T1DM with frequent admissions for DKA. He obtains his insulin OTC at [facility].  He had [facility] with [facility] - we will try to get him follow-up there.  -  DM education while in hospital -[facility] appointment with [facility]     Diet: Carb modified   PPX: Lovenox  Code: Full  Dispo: Likely discharge tomorrow.  This is a Psychologist, occupational Note.  The care of the patient was discussed with Dr. Samuella Cota and the assessment and plan formulated with their assistance.  Please see their attached note for official documentation of the daily encounter.    LOS: 1 day   Nathaneil Canary, Medical Student 04/02/2017, 10:05 AM  Attestation for Student Documentation:  I personally was present and performed or re-performed the history, physical exam and medical decision-making activities of this service and have verified that the service and findings are accurately  documented in the student's note.  Nyra Market, MD 04/02/2017, 10:46 AM

## 2017-04-02 NOTE — Progress Notes (Signed)
Inpatient Diabetes Program Recommendations  AACE/ADA: New Consensus Statement on Inpatient Glycemic Control (2015)  Target Ranges:  Prepandial:   less than 140 mg/dL      Peak postprandial:   less than 180 mg/dL (1-2 hours)      Critically ill patients:  140 - 180 mg/dL   Lab Results  Component Value Date   GLUCAP 189 (H) 04/02/2017   HGBA1C 12.2 (H) 12/28/2016    Review of Glycemic ControlResults for Rickey Smith, Rickey Smith (MRN [id number]) as of 04/02/2017 10:29  Ref. Range 04/02/2017 00:41 04/02/2017 01:42 04/02/2017 02:45 04/02/2017 03:47 04/02/2017 08:38  Glucose-Capillary Latest Ref Range: 65 - 99 mg/dL 409 (H) 811 (H) 914 (H) 136 (H) 189 (H)   Diabetes history: DM with DKA Outpatient Diabetes medications: Novolin 70/30 45 units bid Current orders for Inpatient glycemic control:  Novolog moderate tid with meals, Levemir 20 units q 24 hours  Inpatient Diabetes Program Recommendations:    Spoke with patient regarding DKA admit.  He states that he thinks that the DKA is because of his recent jaw/tooth removal.  He is taking Novolin 70/30 from [place] and also gets his strips from [place].  He states that he was taking his insulin prior to admit.  Last visit to [facility] was over a year ago.  I encouraged patient to follow-up at [facility].  He states that when he monitors, his blood sugars range in the low 100's?   -Please order A1C to determine glycemic control for past 2-3 months.  -Upon d/c patient will need to resume 70/30 regimen due to cost.  Thanks, Beryl Meager, RN, BC-ADM Inpatient Diabetes Coordinator Pager [phone] (8a-5p)

## 2017-04-02 NOTE — Progress Notes (Signed)
Nutrition Brief Note  Patient identified on the Malnutrition Screening Tool (MST) Report  Wt Readings from Last 15 Encounters:  04/01/17 160 lb (72.6 kg)  12/28/16 149 lb 7.6 oz (67.8 kg)  06/28/16 140 lb 14 oz (63.9 kg)  05/01/16 141 lb 5 oz (64.1 kg)  04/23/16 145 lb (65.8 kg)  03/13/16 143 lb 12.8 oz (65.2 kg)  02/11/16 170 lb (77.1 kg)  11/28/15 165 lb (74.8 kg)  10/24/15 165 lb (74.8 kg)  09/13/15 161 lb (73 kg)  08/22/15 155 lb 3.2 oz (70.4 kg)  08/15/15 155 lb 3.3 oz (70.4 kg)  08/10/15 157 lb (71.2 kg)  08/02/15 162 lb 9.6 oz (73.8 kg)  05/23/15 175 lb (79.4 kg)   Pt is a 51 yo M with a history of T1DM, largely noncompliant with insulin therapy, who presents with five days of abdominal pain, nausea, and vomiting.  Pt was in his normal state of health prior to Friday when he developed abdominal pain, nausea, and vomiting that has persisted for the past 5 days.  Pt somnolent at time of visit.   Pt with hx of DM noncompliance; he is uninsured purchases test strips and insulin at [facility] (home regimen 45 units novolin 70/30 45 units BID). Per DM coordinator note, pt reports being compliant with his insulin and attributes DKA to recent dental procedure.   Labs reviewed: CBGS: 136-189 (current orders for glycemic control 0-15 units insulin aspart TID with meals and 20 units insulin determir daily).   Nutrition-Focused physical exam completed. Findings are no fat depletion, no muscle depletion, and no edema. Pt appears thin statured.   Wt hx reviewed; UBW around 155#. No weight loss > 1 year.   Body mass index is 21.11 kg/m. Patient meets criteria for normal weight range based on current BMI.   Current diet order is carb modified, patient is consuming approximately n/a% of meals at this time. Labs and medications reviewed.   No nutrition interventions warranted at this time. If nutrition issues arise, please consult RD.   Rickey Smith A. Mayford Knife, RD, LDN, CDE Pager:  [phone] After hours Pager: [phone]

## 2017-04-02 NOTE — Progress Notes (Signed)
Paged IMTS for blood pressure parameters and PRN med. Awaiting reply.  Received response that PRN med will be ordered. Will continue to monitor.

## 2017-04-03 LAB — GLUCOSE, CAPILLARY
GLUCOSE-CAPILLARY: 229 mg/dL — AB (ref 65–99)
GLUCOSE-CAPILLARY: 293 mg/dL — AB (ref 65–99)

## 2017-04-03 LAB — CBC WITH DIFFERENTIAL/PLATELET
BASOS ABS: 0 10*3/uL (ref 0.0–0.1)
Basophils Relative: 0 %
EOS ABS: 0 10*3/uL (ref 0.0–0.7)
EOS PCT: 0 %
LYMPHS PCT: 20 %
Lymphs Abs: 1 10*3/uL (ref 0.7–4.0)
MONO ABS: 0.6 10*3/uL (ref 0.1–1.0)
Monocytes Relative: 11 %
Neutro Abs: 3.5 10*3/uL (ref 1.7–7.7)
Neutrophils Relative %: 69 %

## 2017-04-03 LAB — CBC
HCT: 35.8 % — ABNORMAL LOW (ref 39.0–52.0)
HCT: 36.2 % — ABNORMAL LOW (ref 39.0–52.0)
HEMOGLOBIN: 12.2 g/dL — AB (ref 13.0–17.0)
Hemoglobin: 12.3 g/dL — ABNORMAL LOW (ref 13.0–17.0)
MCH: 28.6 pg (ref 26.0–34.0)
MCH: 28.4 pg (ref 26.0–34.0)
MCHC: 34.1 g/dL (ref 30.0–36.0)
MCHC: 34 g/dL (ref 30.0–36.0)
MCV: 83.8 fL (ref 78.0–100.0)
MCV: 83.6 fL (ref 78.0–100.0)
Platelets: 44 10*3/uL — ABNORMAL LOW (ref 150–400)
RBC: 4.27 MIL/uL (ref 4.22–5.81)
RBC: 4.33 MIL/uL (ref 4.22–5.81)
RDW: 12.8 % (ref 11.5–15.5)
RDW: 12.6 % (ref 11.5–15.5)
WBC: 6.7 10*3/uL (ref 4.0–10.5)
WBC: 5.8 10*3/uL (ref 4.0–10.5)

## 2017-04-03 LAB — BASIC METABOLIC PANEL
ANION GAP: 8 (ref 5–15)
BUN: 7 mg/dL (ref 6–20)
CALCIUM: 8.2 mg/dL — AB (ref 8.9–10.3)
CO2: 22 mmol/L (ref 22–32)
Chloride: 100 mmol/L — ABNORMAL LOW (ref 101–111)
Creatinine, Ser: 0.57 mg/dL — ABNORMAL LOW (ref 0.61–1.24)
GLUCOSE: 286 mg/dL — AB (ref 65–99)
Potassium: 3.7 mmol/L (ref 3.5–5.1)
SODIUM: 130 mmol/L — AB (ref 135–145)

## 2017-04-03 LAB — SAVE SMEAR

## 2017-04-03 MED ORDER — LISINOPRIL 5 MG PO TABS: 10.0000 mg | ORAL_TABLET | Freq: Every day | ORAL | 2 refills | Status: DC

## 2017-04-03 MED ORDER — PANTOPRAZOLE SODIUM 40 MG PO TBEC
40.0000 mg | DELAYED_RELEASE_TABLET | Freq: Every day | ORAL | 0 refills | Status: DC
Start: 2017-04-04 — End: 2017-04-16

## 2017-04-03 MED ORDER — ONDANSETRON 4 MG PO TBDP: 4.0000 mg | ORAL_TABLET | Freq: Three times a day (TID) | ORAL | 0 refills | Status: DC | PRN

## 2017-04-03 MED ORDER — INSULIN ASPART PROT & ASPART (70-30 MIX) 100 UNIT/ML ~~LOC~~ SUSP: 40.0000 [IU] | Freq: Two times a day (BID) | SUBCUTANEOUS | 11 refills | Status: DC

## 2017-04-03 NOTE — Care Management Note (Signed)
Case Management Note  Patient Details  Name: Rickey Smith MRN: [id number] Date of Birth: 1965/09/11  Subjective/Objective:   Pt  Admitted with DKA               Action/Plan:  PTA from home independent.  Pt has [facility] appt for 10/24 at 10:30am   Expected Discharge Date:                  Expected Discharge Plan:  Home/Self Care  In-House Referral:     Discharge planning Services  CM Consult, [facility]igent [facility]  Post Acute Care Choice:    Choice offered to:     DME Arranged:    DME Agency:     HH Arranged:    HH Agency:     Status of Service:     If discussed at [employer], dates discussed:    Additional Comments: Pt will discharge home today - possibly after [facility] pharmacy closing.  CM provided MATCH letter to pt and informed of [facility] appt Cherylann Parr, RN 04/03/2017, 3:52 PM

## 2017-04-03 NOTE — Progress Notes (Addendum)
   Subjective:  Patient sleeping on evaluation but wakes up easily. He states that his abdomen feels a lot better this morning; he attempted eating yesterday, however found that the more food he ate the better his stomach felt. He also had some more headache yesterday, that is resolved today. He is hopeful he will be able to eat his breakfast this morning.  Objective:  Vital signs in last 24 hours: Vitals:   04/03/17 0300 04/03/17 0341 04/03/17 0400 04/03/17 0700  BP: 136/80  138/89 120/73  Pulse: 84  87 90  Resp: Temp:  98.4 F (36.9 C) 98.7 F (37.1 C)   TempSrc:  Oral Oral   SpO2: 96%  98% 98%  Weight:      Height:       Constitutional: NAD, lying in bed comfortably CV: RRR, no murmur, rubs or gallops; pulses intact, no LE edema Resp: CTAB on anterior lung fields Abd: soft, NDNT, +BS  Assessment/Plan:  Active Problems:   DKA (diabetic ketoacidoses) (HCC)  DKA -  Resolved, Gap remains closed this AM. Patient still not been able to eat meal; will attempt this AM with breakfast.  Abd Pain: Patient still having burning abd pain with normal exam. Likely 2/2 gastritis from multiple days of vomiting. -GI cocktail -PPI -will attempt eating breakfast this AM  T1DM -DM education while in hospital -Lantus 20u qhs -will discharge patient on novolog mix 70/30 as this is most affordable option for him at this time; will hopefully get re-established with [facility].  Low platelets on CBC: Patient found to have Plt drop to 44 from 240 yesterday. Some of his medications may be contributing, but this is likely lab error or platelet clumping. --repeat CBC, save smear for review  ADDENDUM: Repeated CBC in citrate tube - lab readout platelets 150; smear review with Dr. Cyndie Chime confirms average platelet count of 150. Will discharge today.  Dispo: Anticipated discharge in approximately 0-1 day(s).   Nyra Market, MD 04/03/2017, 7:50 AM Pager [phone]

## 2017-04-03 NOTE — Progress Notes (Signed)
Patient discharged to home with family, with all belongings and personal items. IVs removed and wheeled to front entrance for pick-up.

## 2017-04-03 NOTE — Progress Notes (Signed)
Medicine attending discharge note: I personally examined this patient on the day of discharge and I attest to the accuracy of the discharge evaluation and plan as recorded by resident physician Dr. Nyra Market.  51 year old man with type 1 diabetes admitted with a 3-4-day history of nausea, vomiting, and abdominal pain typical of previous symptoms when his sugars get out of control.  He admitted to not taking insulin for 24 hours.  He was found to be in DKA with blood sugar 466, serum bicarbonate 9, anion gap 27, arterial blood gas pH 7.1, and positive ketonuria.  No signs or symptoms of infection.  Recent dental extraction but no fever.  Now edentulous.  No sign of gum infection at site of recent dental extraction. Initial white count 13,000.  Repeat 18,000.  In the absence of any obvious infection we elected observation alone.  White count on day of discharge was 6700.  There was an unexpected fall in his platelet count to 44,000.  Only medications given in the hospital were insulin, Protonix, Zestril, Toradol, and prophylactic dose Lovenox.  Time course of following platelet count does not fit with heparin induced thrombocytopenia. CBC will be repeated.  Lab error was suspected.  We had a difficult time getting a good blood sample.  Blood clotted on multiple attempts.  Review of the blood film suggested in vitro platelet clumping.  We had a sample drawn in citrate anticoagulation.  I personally reviewed the slide made from this sample together with resident physician Dr. Nyra Market.  There are approximately 10 platelets per high-power field which correlates with the machine count on this sample of 150,000. Spurious thrombocytopenia confirmed.  Patient otherwise stable for discharge at this time.  Disposition: Condition stable at time of discharge Follow-up with community health and wellness There were no complications

## 2017-04-03 NOTE — Progress Notes (Signed)
Inpatient Diabetes Program Recommendations  AACE/ADA: New Consensus Statement on Inpatient Glycemic Control (2015)  Target Ranges:  Prepandial:   less than 140 mg/dL      Peak postprandial:   less than 180 mg/dL (1-2 hours)      Critically ill patients:  140 - 180 mg/dL   Lab Results  Component Value Date   GLUCAP 229 (H) 04/03/2017   HGBA1C 12.2 (H) 12/28/2016    Review of Glycemic ControlResults for NESHAWN, AIRD (MRN [id number]) as of 04/03/2017 08:54  Ref. Range 04/02/2017 11:55 04/02/2017 14:23 04/02/2017 17:05 04/02/2017 21:42 04/03/2017 07:51  Glucose-Capillary Latest Ref Range: 65 - 99 mg/dL 782 (H) 956 (H) 213 (H) 284 (H) 229 (H)   Diabetes history: DM with DKA Outpatient Diabetes medications: Novolin 70/30 45 units bid Current orders for Inpatient glycemic control:  Novolog moderate tid with meals, Levemir 20 units q 24 hours  Inpatient Diabetes Program Recommendations:    If appropriate, consider d/c of Levemir and restart Novolog 70/30 25 units bid.   Thanks, Beryl Meager, RN, BC-ADM Inpatient Diabetes Coordinator Pager [phone] (8a-5p)

## 2017-04-03 NOTE — Discharge Instructions (Signed)
Diabetic Ketoacidosis Diabetic ketoacidosis is a life-threatening complication of diabetes. If it is not treated, it can cause severe dehydration and organ damage and can lead to a coma or death. What are the causes? This condition develops when there is not enough of the hormone insulin in the body. Insulin helps the body to break down sugar for energy. Without insulin, the body cannot break down sugar, so it breaks down fats instead. This leads to the production of acids that are called ketones. Ketones are poisonous at high levels. This condition can be triggered by:  Stress on the body that is brought on by an illness.  Medicines that raise blood glucose levels.  Not taking diabetes medicine.  What are the signs or symptoms? Symptoms of this condition include:  Fatigue.  Weight loss.  Excessive thirst.  Light-headedness.  Fruity or sweet-smelling breath.  Excessive urination.  Vision changes.  Confusion or irritability.  Nausea.  Vomiting.  Rapid breathing.  Abdominal pain.  Feeling flushed.  How is this diagnosed? This condition is diagnosed based on a medical history, a physical exam, and blood tests. You may also have a urine test that checks for ketones. How is this treated? This condition may be treated with:  Fluid replacement. This may be done to correct dehydration.  Insulin injections. These may be given through the skin or through an IV tube.  Electrolyte replacement. Electrolytes, such as potassium and sodium, may be given in pill form or through an IV tube.  Antibiotic medicines. These may be prescribed if your condition was caused by an infection.  Follow these instructions at home: Eating and drinking  Drink enough fluids to keep your urine clear or pale yellow.  If you cannot eat, alternate between drinking fluids with sugar (such as juice) and salty fluids (such as broth or bouillon).  If you can eat, follow your usual diet and  drink sugar-free liquids, such as water. Other Instructions   Take insulin as directed by your health care provider. Do not skip insulin injections. Do not use expired insulin.  If your blood sugar is over 240 mg/dL, monitor your urine ketones every 4-6 hours.  If you were prescribed an antibiotic medicine, finish all of it even if you start to feel better.  Rest and exercise only as directed by your health care provider.  If you get sick, call your health care provider and begin treatment quickly. Your body often needs extra insulin to fight an illness.  Check your blood glucose levels regularly. If your blood glucose is high, drink plenty of fluids. This helps to flush out ketones. Contact a health care provider if:  Your blood glucose level is too high or too low.  You have ketones in your urine.  You have a fever.  You cannot eat.  You cannot tolerate fluids.  You have been vomiting for more than 2 hours.  You continue to have symptoms of this condition.  You develop new symptoms. Get help right away if:  Your blood glucose levels continue to be high (elevated).  Your monitor reads high even when you are taking insulin.  You faint.  You have chest pain.  You have trouble breathing.  You have a sudden, severe headache.  You have sudden weakness in one arm or one leg.  You have sudden trouble speaking or swallowing.  You have vomiting or diarrhea that gets worse after 3 hours.  You feel severely fatigued.  You have trouble thinking.  You have abdominal pain.  You are severely dehydrated. Symptoms of severe dehydration include: ? Extreme thirst. ? Dry mouth. ? Blue lips. ? Cold hands and feet. ? Rapid breathing. This information is not intended to replace advice given to you by your health care provider. Make sure you discuss any questions you have with your health care provider. Document Released: 06/07/2000 Document Revised: 11/16/2015 Document  Reviewed: 05/18/2014 Elsevier Interactive Patient Education  2017 [employer] You were seen in the hospital after an episode of very high blood sugar (also known as diabetic ketoacidosis or DKA) that was associated with nausea and vomiting.  It is important that you continue to take your insulin as prescribed and follow-up with the [facility].  Please continue taking the following medications at discharge: -Novolog 70/30 45units in the morning and at night  Check your blood sugar every morning and with meals, and keep a record of the readings to bring to your doctor.  Please call the [facility] Monday morning (10/15) to schedule a follow-up appointment for your diabetes.  Their phone number is [phone] Return to the hospital if your blood sugar is ever greater than 450, if you develop any new fevers, chest pain, or shortness of breath, or your nausea and vomiting returns.      Blood Glucose Monitoring, Adult Monitoring your blood sugar (glucose) helps you manage your diabetes. It also helps you and your health care provider determine how well your diabetes management plan is working. Blood glucose monitoring involves checking your blood glucose as often as directed, and keeping a record (log) of your results over time. Why should I monitor my blood glucose? Checking your blood glucose regularly can:  Help you understand how food, exercise, illnesses, and medicines affect your blood glucose.  Let you know what your blood glucose is at any time. You can quickly tell if you are having low blood glucose (hypoglycemia) or high blood glucose (hyperglycemia).  Help you and your health care provider adjust your medicines as needed.  When should I check my blood glucose? Follow instructions from your health care provider about how often to check your blood glucose. This may depend on:  The type of diabetes you have.  How well-controlled your diabetes  is.  Medicines you are taking.  If you have type 1 diabetes:  Check your blood glucose at least 2 times a day.  Also check your blood glucose: ? Before every insulin injection. ? Before and after exercise. ? Between meals. ? 2 hours after a meal. ? Occasionally between 2:00 a.m. and 3:00 a.m., as directed. ? Before potentially dangerous tasks, like driving or using heavy machinery. ? At bedtime.  You may need to check your blood glucose more often, up to 6-10 times a day: ? If you use an insulin pump. ? If you need multiple daily injections (MDI). ? If your diabetes is not well-controlled. ? If you are ill. ? If you have a history of severe hypoglycemia. ? If you have a history of not knowing when your blood glucose is getting low (hypoglycemia unawareness). If you have type 2 diabetes:  If you take insulin or other diabetes medicines, check your blood glucose at least 2 times a day.  If you are on intensive insulin therapy, check your blood glucose at least 4 times a day. Occasionally, you may also need to check between 2:00 a.m. and 3:00 a.m., as directed.  Also check your blood glucose: ?  Before and after exercise. ? Before potentially dangerous tasks, like driving or using heavy machinery.  You may need to check your blood glucose more often if: ? Your medicine is being adjusted. ? Your diabetes is not well-controlled. ? You are ill. What is a blood glucose log?  A blood glucose log is a record of your blood glucose readings. It helps you and your health care provider: ? Look for patterns in your blood glucose over time. ? Adjust your diabetes management plan as needed.  Every time you check your blood glucose, write down your result and notes about things that may be affecting your blood glucose, such as your diet and exercise for the day.  Most glucose meters store a record of glucose readings in the meter. Some meters allow you to download your records to a  computer. How do I check my blood glucose? Follow these steps to get accurate readings of your blood glucose: Supplies needed   Blood glucose meter.  Test strips for your meter. Each meter has its own strips. You must use the strips that come with your meter.  A needle to prick your finger (lancet). Do not use lancets more than once.  A device that holds the lancet (lancing device).  A journal or log book to write down your results. Procedure  Wash your hands with soap and water.  Prick the side of your finger (not the tip) with the lancet. Use a different finger each time.  Gently rub the finger until a small drop of blood appears.  Follow instructions that come with your meter for inserting the test strip, applying blood to the strip, and using your blood glucose meter.  Write down your result and any notes. Alternative testing sites  Some meters allow you to use areas of your body other than your finger (alternative sites) to test your blood.  If you think you may have hypoglycemia, or if you have hypoglycemia unawareness, do not use alternative sites. Use your finger instead.  Alternative sites may not be as accurate as the fingers, because blood flow is slower in these areas. This means that the result you get may be delayed, and it may be different from the result that you would get from your finger.  The most common alternative sites are: ? Forearm. ? Thigh. ? Palm of the hand. Additional tips  Always keep your supplies with you.  If you have questions or need help, all blood glucose meters have a 24-hour hotline number that you can call. You may also contact your health care provider.  After you use a few boxes of test strips, adjust (calibrate) your blood glucose meter by following instructions that came with your meter. This information is not intended to replace advice given to you by your health care provider. Make sure you discuss any questions you have  with your health care provider. Document Released: 06/13/2003 Document Revised: 12/29/2015 Document Reviewed: 11/20/2015 Elsevier Interactive Patient Education  2017 [employer].

## 2017-04-16 ENCOUNTER — Ambulatory Visit: Payer: Self-pay | Attending: Internal Medicine | Admitting: Physician Assistant

## 2017-04-16 ENCOUNTER — Encounter: Payer: Self-pay | Admitting: Physician Assistant

## 2017-04-16 VITALS — BP 110/75 | HR 103 | Temp 98.1°F | Resp 16 | Wt 157.0 lb

## 2017-04-16 DIAGNOSIS — F1911 Other psychoactive substance abuse, in remission: Secondary | ICD-10-CM | POA: Insufficient documentation

## 2017-04-16 DIAGNOSIS — Z79899 Other long term (current) drug therapy: Secondary | ICD-10-CM | POA: Insufficient documentation

## 2017-04-16 DIAGNOSIS — R000 Tachycardia, unspecified: Secondary | ICD-10-CM | POA: Insufficient documentation

## 2017-04-16 DIAGNOSIS — E785 Hyperlipidemia, unspecified: Secondary | ICD-10-CM | POA: Insufficient documentation

## 2017-04-16 DIAGNOSIS — E1165 Type 2 diabetes mellitus with hyperglycemia: Secondary | ICD-10-CM

## 2017-04-16 DIAGNOSIS — E1065 Type 1 diabetes mellitus with hyperglycemia: Secondary | ICD-10-CM | POA: Insufficient documentation

## 2017-04-16 DIAGNOSIS — E1010 Type 1 diabetes mellitus with ketoacidosis without coma: Secondary | ICD-10-CM | POA: Insufficient documentation

## 2017-04-16 DIAGNOSIS — I10 Essential (primary) hypertension: Secondary | ICD-10-CM | POA: Insufficient documentation

## 2017-04-16 DIAGNOSIS — R109 Unspecified abdominal pain: Secondary | ICD-10-CM | POA: Insufficient documentation

## 2017-04-16 DIAGNOSIS — Z794 Long term (current) use of insulin: Secondary | ICD-10-CM | POA: Insufficient documentation

## 2017-04-16 DIAGNOSIS — E1040 Type 1 diabetes mellitus with diabetic neuropathy, unspecified: Secondary | ICD-10-CM | POA: Insufficient documentation

## 2017-04-16 LAB — POCT GLYCOSYLATED HEMOGLOBIN (HGB A1C): HEMOGLOBIN A1C: 11.4

## 2017-04-16 LAB — GLUCOSE, POCT (MANUAL RESULT ENTRY): POC GLUCOSE: 286 mg/dL — AB (ref 70–99)

## 2017-04-16 MED ORDER — ATORVASTATIN CALCIUM 40 MG PO TABS: 40.0000 mg | ORAL_TABLET | Freq: Every day | ORAL | 3 refills | Status: DC

## 2017-04-16 MED ORDER — AMITRIPTYLINE HCL 75 MG PO TABS: 75.0000 mg | ORAL_TABLET | Freq: Every day | ORAL | 3 refills | Status: AC

## 2017-04-16 MED ORDER — PANTOPRAZOLE SODIUM 40 MG PO TBEC: 40.0000 mg | DELAYED_RELEASE_TABLET | Freq: Every day | ORAL | 11 refills | Status: DC

## 2017-04-16 MED ORDER — INSULIN ASPART PROT & ASPART (70-30 MIX) 100 UNIT/ML ~~LOC~~ SUSP: 40.0000 [IU] | Freq: Two times a day (BID) | SUBCUTANEOUS | 11 refills | Status: DC

## 2017-04-16 MED ORDER — PREGABALIN 100 MG PO CAPS: 100.0000 mg | ORAL_CAPSULE | Freq: Two times a day (BID) | ORAL | 3 refills | Status: DC

## 2017-04-16 MED ORDER — LISINOPRIL 5 MG PO TABS: 10.0000 mg | ORAL_TABLET | Freq: Every day | ORAL | 11 refills | Status: DC

## 2017-04-16 NOTE — Patient Instructions (Signed)
Please keep a log of your sugars 3-4 times per day and bring to next appointment Please take all meds as prescribed Aim for 30 minutes of exercise most days. Rethink what you drink. Water is great! Aim for 2-3 Carb Choices per meal (30-45 grams) +/- 1 either way  Aim for 0-15 Carbs per snack if hungry  Include protein in moderation with your meals and snacks  Consider reading food labels for Total Carbohydrate and Fat Grams of foods  Consider checking BG at alternate times per day  Continue taking medication as directed Be mindful about how much sugar you are adding to beverages and other foods. Fruit Punch - find one with no sugar  Measure and decrease portions of carbohydrate foods  Make your plate and don't go back for seconds
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
## 2017-05-09 ENCOUNTER — Other Ambulatory Visit: Payer: Self-pay

## 2017-05-09 ENCOUNTER — Emergency Department (HOSPITAL_COMMUNITY)
Admission: EM | Admit: 2017-05-09 | Discharge: 2017-05-10 | Disposition: A | Discharge status: Home / Self Care | Payer: Self-pay | Attending: Emergency Medicine | Admitting: Emergency Medicine

## 2017-05-09 ENCOUNTER — Encounter (HOSPITAL_COMMUNITY): Payer: Self-pay | Admitting: Emergency Medicine

## 2017-05-09 DIAGNOSIS — Z79899 Other long term (current) drug therapy: Secondary | ICD-10-CM | POA: Insufficient documentation

## 2017-05-09 DIAGNOSIS — E1065 Type 1 diabetes mellitus with hyperglycemia: Secondary | ICD-10-CM | POA: Insufficient documentation

## 2017-05-09 DIAGNOSIS — R109 Unspecified abdominal pain: Secondary | ICD-10-CM | POA: Insufficient documentation

## 2017-05-09 DIAGNOSIS — R112 Nausea with vomiting, unspecified: Secondary | ICD-10-CM | POA: Insufficient documentation

## 2017-05-09 DIAGNOSIS — K2970 Gastritis, unspecified, without bleeding: Secondary | ICD-10-CM | POA: Insufficient documentation

## 2017-05-09 DIAGNOSIS — R739 Hyperglycemia, unspecified: Secondary | ICD-10-CM

## 2017-05-09 DIAGNOSIS — F17210 Nicotine dependence, cigarettes, uncomplicated: Secondary | ICD-10-CM | POA: Insufficient documentation

## 2017-05-09 LAB — I-STAT CG4 LACTIC ACID, ED
Lactic Acid, Venous: 1.5 mmol/L (ref 0.5–1.9)
Lactic Acid, Venous: 0.94 mmol/L (ref 0.5–1.9)

## 2017-05-09 LAB — COMPREHENSIVE METABOLIC PANEL
ALK PHOS: 78 U/L (ref 38–126)
ALT: 12 U/L — ABNORMAL LOW (ref 17–63)
ANION GAP: 14 (ref 5–15)
AST: 11 U/L — ABNORMAL LOW (ref 15–41)
Albumin: 4.1 g/dL (ref 3.5–5.0)
BUN: 20 mg/dL (ref 6–20)
CALCIUM: 9.6 mg/dL (ref 8.9–10.3)
CO2: 20 mmol/L — AB (ref 22–32)
Chloride: 98 mmol/L — ABNORMAL LOW (ref 101–111)
Creatinine, Ser: 0.9 mg/dL (ref 0.61–1.24)
GFR calc non Af Amer: >60 mL/min (ref >60)
Glucose, Bld: 238 mg/dL — ABNORMAL HIGH (ref 65–99)
POTASSIUM: 4.5 mmol/L (ref 3.5–5.1)
SODIUM: 132 mmol/L — AB (ref 135–145)
TOTAL PROTEIN: 7.5 g/dL (ref 6.5–8.1)
Total Bilirubin: 0.9 mg/dL (ref 0.3–1.2)

## 2017-05-09 LAB — BLOOD GAS, VENOUS
ACID-BASE DEFICIT: 0.2 mmol/L (ref 0.0–2.0)
BICARBONATE: 22.8 mmol/L (ref 20.0–28.0)
O2 Saturation: 72.1 %
PH VEN: 7.439 — AB (ref 7.250–7.430)
PO2 VEN: 40 mmHg (ref 32.0–45.0)
Patient temperature: 98.6
pCO2, Ven: 34.2 mmHg — ABNORMAL LOW (ref 44.0–60.0)

## 2017-05-09 LAB — CBC
HEMATOCRIT: 41.1 % (ref 39.0–52.0)
HEMOGLOBIN: 14.3 g/dL (ref 13.0–17.0)
MCH: 29.1 pg (ref 26.0–34.0)
MCHC: 34.8 g/dL (ref 30.0–36.0)
MCV: 83.5 fL (ref 78.0–100.0)
Platelets: 342 10*3/uL (ref 150–400)
RBC: 4.92 MIL/uL (ref 4.22–5.81)
RDW: 13 % (ref 11.5–15.5)
WBC: 17.6 10*3/uL — ABNORMAL HIGH (ref 4.0–10.5)

## 2017-05-09 LAB — URINALYSIS, ROUTINE W REFLEX MICROSCOPIC
BACTERIA UA: NONE SEEN
Bilirubin Urine: NEGATIVE
Glucose, UA: >=500 mg/dL — AB
HGB URINE DIPSTICK: NEGATIVE
KETONES UR: 80 mg/dL — AB
Leukocytes, UA: NEGATIVE
NITRITE: NEGATIVE
PROTEIN: NEGATIVE mg/dL
Specific Gravity, Urine: 1.035 — ABNORMAL HIGH (ref 1.005–1.030)
Squamous Epithelial / LPF: NONE SEEN
pH: 5 (ref 5.0–8.0)

## 2017-05-09 LAB — CBG MONITORING, ED
GLUCOSE-CAPILLARY: 246 mg/dL — AB (ref 65–99)
Glucose-Capillary: 179 mg/dL — ABNORMAL HIGH (ref 65–99)

## 2017-05-09 LAB — LIPASE, BLOOD: Lipase: 19 U/L (ref 11–51)

## 2017-05-09 MED ORDER — INSULIN ASPART PROT & ASPART (70-30 MIX) 100 UNIT/ML ~~LOC~~ SUSP
40.0000 [IU] | Freq: Once | SUBCUTANEOUS | Status: AC
  Administered 2017-05-09: 40 [IU] via SUBCUTANEOUS
  Filled 2017-05-09: qty 10

## 2017-05-09 MED ORDER — SODIUM CHLORIDE 0.9 % IV SOLN
1000.0000 mL | INTRAVENOUS | Status: DC
  Administered 2017-05-09: 1000 mL via INTRAVENOUS

## 2017-05-09 MED ORDER — ONDANSETRON HCL 4 MG/2ML IJ SOLN: 4.0000 mg | Freq: Once | INTRAMUSCULAR | Status: DC | PRN

## 2017-05-09 MED ORDER — FAMOTIDINE 20 MG PO TABS: 20.0000 mg | ORAL_TABLET | Freq: Two times a day (BID) | ORAL | 0 refills | Status: DC

## 2017-05-09 MED ORDER — SODIUM CHLORIDE 0.9 % IV BOLUS (SEPSIS)
1000.0000 mL | Freq: Once | INTRAVENOUS | Status: AC
  Administered 2017-05-09: 1000 mL via INTRAVENOUS

## 2017-05-09 MED ORDER — FAMOTIDINE IN NACL 20-0.9 MG/50ML-% IV SOLN
20.0000 mg | Freq: Once | INTRAVENOUS | Status: AC
  Administered 2017-05-09: 20 mg via INTRAVENOUS
  Filled 2017-05-09: qty 50

## 2017-05-09 MED ORDER — PANTOPRAZOLE SODIUM 40 MG IV SOLR
40.0000 mg | Freq: Once | INTRAVENOUS | Status: AC
  Administered 2017-05-09: 40 mg via INTRAVENOUS
  Filled 2017-05-09: qty 40

## 2017-05-09 MED ORDER — ONDANSETRON 4 MG PO TBDP
4.0000 mg | ORAL_TABLET | ORAL | 0 refills | Status: DC | PRN
Start: 2017-05-09 — End: 2017-06-03

## 2017-05-09 MED ORDER — HALOPERIDOL LACTATE 5 MG/ML IJ SOLN: 2.0000 mg | Freq: Once | INTRAMUSCULAR | Status: DC

## 2017-05-09 MED ORDER — PROMETHAZINE HCL 25 MG/ML IJ SOLN
12.5000 mg | Freq: Once | INTRAMUSCULAR | Status: AC
  Administered 2017-05-09: 12.5 mg via INTRAVENOUS
  Filled 2017-05-09: qty 1

## 2017-05-09 MED ORDER — PROMETHAZINE HCL 25 MG RE SUPP: 25.0000 mg | Freq: Four times a day (QID) | RECTAL | 1 refills | Status: DC | PRN

## 2017-05-09 NOTE — ED Notes (Signed)
Bed: WA03 Expected date:  Expected time:  Means of arrival:  Comments: EMS 51y/o abd pain, IV established

## 2017-05-09 NOTE — Discharge Instructions (Signed)
1.  Continue to take your insulin and monitor your blood sugars closely. 2.  Take Pepcid twice daily and continue your daily Protonix as prescribed. 3.  Take Zofran every 4 hours as needed for nausea.  You may also take Phenergan as prescribed if needed in addition.

## 2017-05-09 NOTE — ED Triage Notes (Addendum)
Patient BIB EMS from home with complaints of abdominal pain starting 3 days ago. Patient reports having diarrhea, nausea, and vomiting. Patient has history of diabetes and is hyperglycemic for EMS. CBG= 285. Patient describes his pain as "burning allover my belly." Patient last self-administered 70/30 insulin at lunch. 18G Left posterior AC placed en route - IV NS administered by EMS. Vitals for EMS 170/110, HR 122, 99.60F, 99%RA

## 2017-05-09 NOTE — ED Provider Notes (Signed)
[facility]-EMERGENCY DEPT Provider Note   CSN: [id number] Arrival date & time: 05/09/17  1710     History   Chief Complaint Chief Complaint  Patient presents with  . Hyperglycemia  . Abdominal Pain    HPI Dolores HooseWilliam S Power is a 51 y.o. male.  HPI Patient reports that he has started vomiting multiple times today.  He started with nausea and burning epigastric pain 3 days ago.  3 days ago he had diarrhea but that resolved.  The burning abdominal pain has persisted and nausea and vomiting have persisted.  Patient reports he feels weak.  He has not been taking his temperature to know if he has run a fever.  No lower extremity swelling.  No chest pain or cough.  Patient reports he has been taking his insulin.  He reports blood sugars when controlled run in the low 100s.  Midday today it was mid 200s.  Reports he took his 7030 insulin at lunchtime. Past Medical History:  Diagnosis Date  . Diabetes mellitus without complication (HCC)   . Migraine     Patient Active Problem List   Diagnosis Date Noted  . DKA, type 2, not at goal [facility](HCC) 12/28/2016  . Uncontrolled type 2 diabetes mellitus with hyperglycemia, with long-term current use of insulin (HCC) 12/28/2016  . Cocaine use 12/28/2016  . Diabetes mellitus due to underlying condition with hyperosmolarity without nonketotic hyperglycemic-hyperosmolar coma [facility] (HCC) 06/27/2016  . Periodontal disease 05/02/2016  . Tobacco abuse 04/30/2016  . Polysubstance abuse (HCC) 04/30/2016  . Hyperglycemia 04/30/2016  . ARF (acute renal failure) (HCC) 04/23/2016  . Unresponsive episode 04/23/2016  . DKA, type 2 (HCC) 04/23/2016  . Sepsis (HCC) 08/12/2015  . Prostate abscess 08/11/2015  . Abscess 08/11/2015  . UTI (lower urinary tract infection) 08/11/2015  . Diabetic neuropathy (HCC) 08/10/2015  . Arterial hypotension   . Tachycardia 08/02/2015  . SIRS (systemic inflammatory response syndrome) (HCC) 08/02/2015  .  GERD (gastroesophageal reflux disease) 10/26/2014  . Leukocytosis 10/23/2014  . Hyperkalemia 10/23/2014  . Hyperglycemia without ketosis 07/13/2014  . Diabetic hyperosmolar non-ketotic state (HCC) 07/13/2014  . Diabetes mellitus with nonketotic hyperosmolarity (HCC) 07/13/2014  . DKA (diabetic ketoacidoses) (HCC) 04/26/2014  . Chest pain, ROMI, DDX pericarditis 04/26/2014  . Ex-smoker 04/26/2014  . Hyponatremia 04/26/2014  . Metabolic acidosis 04/26/2014    Past Surgical History:  Procedure Laterality Date  . TRANSURETHRAL RESECTION DRAINAGE OF PROSTATE ABCESS N/A 08/16/2015   Performed by Barron AlvineGrapey, David, [facility]       Home Medications    Prior to Admission medications   Medication Sig Start Date End Date Taking? Authorizing Provider  amitriptyline (ELAVIL) 75 MG tablet Take 1 tablet (75 mg total) by mouth at bedtime. 04/16/17  Yes Danelle EarthlyNoel, Tiffany S, PA-C  atorvastatin (LIPITOR) 40 MG tablet Take 1 tablet (40 mg total) by mouth daily. 04/16/17  Yes Danelle EarthlyNoel, Tiffany S, PA-C  insulin aspart protamine- aspart (NOVOLOG MIX 70/30) (70-30) 100 UNIT/ML injection Inject 0.4 mLs (40 Units total) into the skin 2 (two) times daily with a meal. 04/16/17  Yes Danelle EarthlyNoel, Tiffany S, PA-C  lisinopril (PRINIVIL,ZESTRIL) 5 MG tablet Take 2 tablets (10 mg total) by mouth daily. 04/16/17  Yes Danelle EarthlyNoel, Tiffany S, PA-C  pantoprazole (PROTONIX) 40 MG tablet Take 1 tablet (40 mg total) by mouth daily. 04/16/17  Yes Danelle EarthlyNoel, Tiffany S, PA-C  acetaminophen (TYLENOL) 325 MG tablet Take 650 mg by mouth every 6 (six) hours as needed for mild  pain.    [provider]  famotidine (PEPCID) 20 MG tablet Take 1 tablet (20 mg total) 2 (two) times daily by mouth. 05/09/17   Arby Barrette, MD  ondansetron (ZOFRAN ODT) 4 MG disintegrating tablet Take 1 tablet (4 mg total) every 4 (four) hours as needed by mouth for nausea or vomiting. 05/09/17   Arby Barrette, MD  ondansetron (ZOFRAN-ODT) 4 MG disintegrating tablet Take  1 tablet (4 mg total) by mouth every 8 (eight) hours as needed for nausea or vomiting. Patient not taking: Reported on 04/16/2017 04/03/17   Nyra Market, MD  pregabalin (LYRICA) 100 MG capsule Take 1 capsule (100 mg total) by mouth 2 (two) times daily. Patient not taking: Reported on 05/09/2017 04/16/17   Vivianne Master, PA-C  promethazine (PHENERGAN) 25 MG suppository Place 1 suppository (25 mg total) every 6 (six) hours as needed rectally for nausea or vomiting. 05/09/17   Arby Barrette, MD    Family History Family History  Problem Relation Age of Onset  . Heart attack Unknown   . Prostate cancer Unknown   . Hypertension Unknown   . Hypertension Mother   . Cancer Father     Social History Social History   Tobacco Use  . Smoking status: Current Some Day Smoker    Packs/day: 0.50    Years: 20.00    Pack years: 10.00    Types: Cigarettes  . Smokeless tobacco: Never Used  Substance Use Topics  . Alcohol use: No  . Drug use: Yes    Types: Cocaine, Other-see comments    Comment: patient denies 12/27/2016     Allergies   Codeine and Gabapentin   Review of Systems Review of Systems 10 Systems reviewed and are negative for acute change except as noted in the HPI.   Physical Exam Updated Vital Signs BP (!) 142/87   Pulse (!) 112   Temp 97.7 F (36.5 C) (Oral)   Resp (!) 23   SpO2 98%   Physical Exam  Constitutional: He is oriented to person, place, and time.  Patient is alert and appropriate.  He does smell ketotic.  Patient is thin but well-nourished and well-developed.  No respiratory distress.  He appears uncomfortable in appearance  HENT:  Head: Normocephalic and atraumatic.  Mouth/Throat: Oropharynx is clear and moist.  Eyes: EOM are normal.  Neck: Neck supple.  Cardiovascular:  Tachycardia.  No gross rub murmur gallop.  Pulmonary/Chest: Effort normal and breath sounds normal.  Abdominal: Soft.  Mild diffuse discomfort to palpation without  guarding.  Musculoskeletal: Normal range of motion. He exhibits no edema or tenderness.  Lower extremities normal with no peripheral edema and no skin changes. Bilateral upper extremities have multiple small abrasions over the dorsums of the patient's hands and lower forearms.  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  None appear secondarily infected.  Neurological: He is alert and oriented to person, place, and time. No cranial nerve deficit. He exhibits normal muscle tone. Coordination normal.  Skin: Skin is warm and dry.  Psychiatric: He has a normal mood and affect.     ED Treatments / Results  Labs (all labs ordered are listed, but only abnormal results are displayed) Labs Reviewed  COMPREHENSIVE METABOLIC PANEL - Abnormal; Notable for the following components:      Result Value   Sodium 132 (*)    Chloride 98 (*)    CO2 20 (*)    Glucose, Bld 238 (*)    AST  11 (*)    ALT 12 (*)    All other components within normal limits  CBC - Abnormal; Notable for the following components:   WBC 17.6 (*)    All other components within normal limits  URINALYSIS, ROUTINE W REFLEX MICROSCOPIC - Abnormal; Notable for the following components:   Specific Gravity, Urine 1.035 (*)    Glucose, UA >=500 (*)    Ketones, ur 80 (*)    All other components within normal limits  BLOOD GAS, VENOUS - Abnormal; Notable for the following components:   pH, Ven 7.439 (*)    pCO2, Ven 34.2 (*)    All other components within normal limits  CBG MONITORING, ED - Abnormal; Notable for the following components:   Glucose-Capillary 246 (*)    All other components within normal limits  CBG MONITORING, ED - Abnormal; Notable for the following components:   Glucose-Capillary 179 (*)    All other components within normal limits  LIPASE, BLOOD  I-STAT CG4 LACTIC ACID, ED  I-STAT CG4 LACTIC ACID, ED  CBG MONITORING, ED    EKG  EKG Interpretation None       Radiology No results  found.  Procedures Procedures (including critical care time)  Medications Ordered in ED Medications  ondansetron (ZOFRAN) injection 4 mg (not administered)  sodium chloride 0.9 % bolus 1,000 mL (0 mLs Intravenous Stopped 05/09/17 1948)    Followed by  0.9 %  sodium chloride infusion (1,000 mLs Intravenous New Bag/Given 05/09/17 1955)  pantoprazole (PROTONIX) injection 40 mg (40 mg Intravenous Given 05/09/17 1833)  promethazine (PHENERGAN) injection 12.5 mg (12.5 mg Intravenous Given 05/09/17 1833)  insulin aspart protamine- aspart (NOVOLOG MIX 70/30) injection 40 Units (40 Units Subcutaneous Given 05/09/17 2331)  promethazine (PHENERGAN) injection 12.5 mg (12.5 mg Intravenous Given 05/09/17 2215)  sodium chloride 0.9 % bolus 1,000 mL (0 mLs Intravenous Stopped 05/09/17 2303)  famotidine (PEPCID) IVPB 20 mg premix (0 mg Intravenous Stopped 05/09/17 2304)     Initial Impression / Assessment and Plan / ED Course  I have reviewed the triage vital signs and the nursing notes.  Pertinent labs & imaging results that were available during my care of the patient were reviewed by me and considered in my medical decision making (see chart for details).    Recheck 21: 05 burning has improved and epigastrium.  Reports he still has some mild nausea but has had no further vomiting.  Generally patient is improved.  Heart rate still tachycardic in the 1 teens.  Will administer second liter of fluids, Phenergan and Pepcid.  Will administer patient's evening 70/30 insulin.  Recheck 23: 40 patient much improved.  There is been no further vomiting.  Blood sugars are under 200.  At this point do feel patient stable for continued outpatient management.  Final Clinical Impressions(s) / ED Diagnoses   Final diagnoses:  Hyperglycemia  Nausea and vomiting, intractability of vomiting not specified, unspecified vomiting type  Gastritis without bleeding, unspecified chronicity, unspecified gastritis type   Uncontrolled type 1 diabetes mellitus with hyperglycemia [facility](HCC)    ED Discharge Orders        Ordered    ondansetron (ZOFRAN ODT) 4 MG disintegrating tablet  Every 4 hours PRN     05/09/17 2354    promethazine (PHENERGAN) 25 MG suppository  Every 6 hours PRN     05/09/17 2354    famotidine (PEPCID) 20 MG tablet  2 times daily     05/09/17 2354  Arby Barrette, MD 05/09/17 [phone]

## 2017-05-10 ENCOUNTER — Other Ambulatory Visit: Payer: Self-pay | Admitting: Internal Medicine

## 2017-05-23 ENCOUNTER — Ambulatory Visit: Payer: Self-pay | Admitting: Internal Medicine

## 2017-06-01 ENCOUNTER — Other Ambulatory Visit: Payer: Self-pay

## 2017-06-01 ENCOUNTER — Encounter (HOSPITAL_COMMUNITY): Payer: Self-pay | Admitting: Emergency Medicine

## 2017-06-01 ENCOUNTER — Inpatient Hospital Stay (HOSPITAL_COMMUNITY)
Admission: EM | Admit: 2017-06-01 | Discharge: 2017-06-03 | DRG: 638 | Disposition: A | Discharge status: Home / Self Care | Payer: Self-pay | Attending: Internal Medicine | Admitting: Internal Medicine

## 2017-06-01 DIAGNOSIS — Z809 Family history of malignant neoplasm, unspecified: Secondary | ICD-10-CM

## 2017-06-01 DIAGNOSIS — N179 Acute kidney failure, unspecified: Secondary | ICD-10-CM | POA: Diagnosis present

## 2017-06-01 DIAGNOSIS — Z888 Allergy status to other drugs, medicaments and biological substances status: Secondary | ICD-10-CM

## 2017-06-01 DIAGNOSIS — E1010 Type 1 diabetes mellitus with ketoacidosis without coma: Principal | ICD-10-CM

## 2017-06-01 DIAGNOSIS — Z8249 Family history of ischemic heart disease and other diseases of the circulatory system: Secondary | ICD-10-CM

## 2017-06-01 DIAGNOSIS — Z794 Long term (current) use of insulin: Secondary | ICD-10-CM

## 2017-06-01 DIAGNOSIS — K210 Gastro-esophageal reflux disease with esophagitis: Secondary | ICD-10-CM | POA: Diagnosis present

## 2017-06-01 DIAGNOSIS — E860 Dehydration: Secondary | ICD-10-CM | POA: Diagnosis present

## 2017-06-01 DIAGNOSIS — E1040 Type 1 diabetes mellitus with diabetic neuropathy, unspecified: Secondary | ICD-10-CM | POA: Diagnosis present

## 2017-06-01 DIAGNOSIS — E875 Hyperkalemia: Secondary | ICD-10-CM | POA: Diagnosis present

## 2017-06-01 DIAGNOSIS — Z885 Allergy status to narcotic agent status: Secondary | ICD-10-CM

## 2017-06-01 DIAGNOSIS — F1410 Cocaine abuse, uncomplicated: Secondary | ICD-10-CM | POA: Diagnosis present

## 2017-06-01 DIAGNOSIS — K209 Esophagitis, unspecified without bleeding: Secondary | ICD-10-CM | POA: Diagnosis present

## 2017-06-01 DIAGNOSIS — Z9079 Acquired absence of other genital organ(s): Secondary | ICD-10-CM

## 2017-06-01 DIAGNOSIS — F1910 Other psychoactive substance abuse, uncomplicated: Secondary | ICD-10-CM | POA: Diagnosis present

## 2017-06-01 DIAGNOSIS — Z9119 Patient's noncompliance with other medical treatment and regimen: Secondary | ICD-10-CM

## 2017-06-01 DIAGNOSIS — G43909 Migraine, unspecified, not intractable, without status migrainosus: Secondary | ICD-10-CM | POA: Diagnosis present

## 2017-06-01 DIAGNOSIS — F17210 Nicotine dependence, cigarettes, uncomplicated: Secondary | ICD-10-CM | POA: Diagnosis present

## 2017-06-01 DIAGNOSIS — E1110 Type 2 diabetes mellitus with ketoacidosis without coma: Secondary | ICD-10-CM | POA: Diagnosis present

## 2017-06-01 LAB — BASIC METABOLIC PANEL
ANION GAP: 27 — AB (ref 5–15)
BUN: 18 mg/dL (ref 6–20)
BUN: 21 mg/dL — AB (ref 6–20)
CHLORIDE: 96 mmol/L — AB (ref 101–111)
CHLORIDE: 103 mmol/L (ref 101–111)
CO2: 9 mmol/L — ABNORMAL LOW (ref 22–32)
CO2: <7 mmol/L — ABNORMAL LOW (ref 22–32)
Calcium: 9 mg/dL (ref 8.9–10.3)
Calcium: 8.1 mg/dL — ABNORMAL LOW (ref 8.9–10.3)
Creatinine, Ser: 1.82 mg/dL — ABNORMAL HIGH (ref 0.61–1.24)
Creatinine, Ser: 1.76 mg/dL — ABNORMAL HIGH (ref 0.61–1.24)
GFR calc Af Amer: 50 mL/min — ABNORMAL LOW (ref >60)
GFR calc non Af Amer: 43 mL/min — ABNORMAL LOW (ref >60)
GFR, EST AFRICAN AMERICAN: 48 mL/min — AB (ref >60)
GFR, EST NON AFRICAN AMERICAN: 42 mL/min — AB (ref >60)
GLUCOSE: 363 mg/dL — AB (ref 65–99)
Glucose, Bld: 445 mg/dL — ABNORMAL HIGH (ref 65–99)
POTASSIUM: 6.2 mmol/L — AB (ref 3.5–5.1)
POTASSIUM: 5.4 mmol/L — AB (ref 3.5–5.1)
SODIUM: 132 mmol/L — AB (ref 135–145)
Sodium: 135 mmol/L (ref 135–145)

## 2017-06-01 LAB — CBC
HCT: 44.5 % (ref 39.0–52.0)
HEMOGLOBIN: 14.4 g/dL (ref 13.0–17.0)
MCH: 29.8 pg (ref 26.0–34.0)
MCHC: 32.4 g/dL (ref 30.0–36.0)
MCV: 91.9 fL (ref 78.0–100.0)
PLATELETS: 341 10*3/uL (ref 150–400)
RBC: 4.84 MIL/uL (ref 4.22–5.81)
RDW: 13.2 % (ref 11.5–15.5)
WBC: 19.5 10*3/uL — AB (ref 4.0–10.5)

## 2017-06-01 LAB — CBG MONITORING, ED
GLUCOSE-CAPILLARY: 374 mg/dL — AB (ref 65–99)
GLUCOSE-CAPILLARY: 419 mg/dL — AB (ref 65–99)
GLUCOSE-CAPILLARY: 244 mg/dL — AB (ref 65–99)
Glucose-Capillary: 381 mg/dL — ABNORMAL HIGH (ref 65–99)
Glucose-Capillary: 307 mg/dL — ABNORMAL HIGH (ref 65–99)
Glucose-Capillary: 177 mg/dL — ABNORMAL HIGH (ref 65–99)
Glucose-Capillary: 147 mg/dL — ABNORMAL HIGH (ref 65–99)

## 2017-06-01 LAB — URINALYSIS, ROUTINE W REFLEX MICROSCOPIC
BILIRUBIN URINE: NEGATIVE
Hgb urine dipstick: NEGATIVE
KETONES UR: 80 mg/dL — AB
Leukocytes, UA: NEGATIVE
NITRITE: NEGATIVE
PH: 5 (ref 5.0–8.0)
Protein, ur: NEGATIVE mg/dL
SPECIFIC GRAVITY, URINE: 1.018 (ref 1.005–1.030)
Squamous Epithelial / LPF: NONE SEEN

## 2017-06-01 LAB — RAPID URINE DRUG SCREEN, HOSP PERFORMED
Amphetamines: NOT DETECTED
BARBITURATES: NOT DETECTED
Benzodiazepines: NOT DETECTED
Cocaine: POSITIVE — AB
Opiates: POSITIVE — AB
Tetrahydrocannabinol: NOT DETECTED

## 2017-06-01 LAB — I-STAT ARTERIAL BLOOD GAS, ED
Acid-base deficit: 20 mmol/L — ABNORMAL HIGH (ref 0.0–2.0)
Bicarbonate: 8.4 mmol/L — ABNORMAL LOW (ref 20.0–28.0)
O2 Saturation: 34 %
PCO2 ART: 26.7 mmHg — AB (ref 32.0–48.0)
PH ART: 7.107 — AB (ref 7.350–7.450)
PO2 ART: 27 mmHg — AB (ref 83.0–108.0)
TCO2: 9 mmol/L — ABNORMAL LOW (ref 22–32)

## 2017-06-01 LAB — HEMOGLOBIN A1C
Hgb A1c MFr Bld: 11.2 % — ABNORMAL HIGH (ref 4.8–5.6)
Mean Plasma Glucose: 274.74 mg/dL

## 2017-06-01 LAB — NA AND K (SODIUM & POTASSIUM), RAND UR
POTASSIUM UR: 54 mmol/L
SODIUM UR: 70 mmol/L

## 2017-06-01 LAB — TROPONIN I

## 2017-06-01 MED ORDER — LOPERAMIDE HCL 2 MG PO CAPS
2.0000 mg | ORAL_CAPSULE | ORAL | Status: DC | PRN
  Administered 2017-06-01: 2 mg via ORAL
  Filled 2017-06-01: qty 1

## 2017-06-01 MED ORDER — SODIUM CHLORIDE 0.9 % IV SOLN
INTRAVENOUS | Status: DC
  Administered 2017-06-01: 7.4 [IU]/h via INTRAVENOUS
  Filled 2017-06-01: qty 1

## 2017-06-01 MED ORDER — DEXTROSE-NACL 5-0.45 % IV SOLN: INTRAVENOUS | Status: DC

## 2017-06-01 MED ORDER — HYDROXYZINE HCL 25 MG PO TABS: 25.0000 mg | ORAL_TABLET | Freq: Four times a day (QID) | ORAL | Status: DC | PRN

## 2017-06-01 MED ORDER — NAPROXEN 500 MG PO TABS
500.0000 mg | ORAL_TABLET | Freq: Two times a day (BID) | ORAL | Status: DC | PRN
  Administered 2017-06-01: 500 mg via ORAL
  Filled 2017-06-01: qty 2
  Filled 2017-06-01: qty 1

## 2017-06-01 MED ORDER — ATORVASTATIN CALCIUM 40 MG PO TABS
40.0000 mg | ORAL_TABLET | Freq: Every day | ORAL | Status: DC
  Administered 2017-06-02 – 2017-06-03 (×2): 40 mg via ORAL
  Filled 2017-06-01 (×3): qty 1

## 2017-06-01 MED ORDER — DEXTROSE-NACL 5-0.45 % IV SOLN
INTRAVENOUS | Status: DC
  Administered 2017-06-01: 22:00:00 via INTRAVENOUS

## 2017-06-01 MED ORDER — METHOCARBAMOL 500 MG PO TABS
500.0000 mg | ORAL_TABLET | Freq: Three times a day (TID) | ORAL | Status: DC | PRN
Start: 2017-06-01 — End: 2017-06-03
  Administered 2017-06-02 (×2): 500 mg via ORAL
  Filled 2017-06-01 (×2): qty 1

## 2017-06-01 MED ORDER — SODIUM CHLORIDE 0.9 % IV BOLUS (SEPSIS)
1000.0000 mL | Freq: Once | INTRAVENOUS | Status: AC
  Administered 2017-06-01: 1000 mL via INTRAVENOUS

## 2017-06-01 MED ORDER — DICYCLOMINE HCL 20 MG PO TABS
20.0000 mg | ORAL_TABLET | Freq: Four times a day (QID) | ORAL | Status: DC | PRN
  Administered 2017-06-01 – 2017-06-02 (×2): 20 mg via ORAL
  Filled 2017-06-01 (×2): qty 1

## 2017-06-01 MED ORDER — SODIUM CHLORIDE 0.9 % IV SOLN
80.0000 mg | Freq: Once | INTRAVENOUS | Status: AC
  Administered 2017-06-01: 80 mg via INTRAVENOUS
  Filled 2017-06-01: qty 80

## 2017-06-01 MED ORDER — AMITRIPTYLINE HCL 50 MG PO TABS
75.0000 mg | ORAL_TABLET | Freq: Every day | ORAL | Status: DC
  Administered 2017-06-01 – 2017-06-02 (×2): 75 mg via ORAL
  Filled 2017-06-01: qty 3
  Filled 2017-06-01: qty 1

## 2017-06-01 MED ORDER — FAMOTIDINE 20 MG PO TABS
20.0000 mg | ORAL_TABLET | Freq: Two times a day (BID) | ORAL | Status: DC
  Administered 2017-06-01 – 2017-06-03 (×4): 20 mg via ORAL
  Filled 2017-06-01 (×4): qty 1

## 2017-06-01 MED ORDER — SODIUM POLYSTYRENE SULFONATE 15 GM/60ML PO SUSP
15.0000 g | Freq: Once | ORAL | Status: AC
  Administered 2017-06-01: 15 g via ORAL
  Filled 2017-06-01: qty 60

## 2017-06-01 MED ORDER — SODIUM CHLORIDE 0.9 % IV SOLN
INTRAVENOUS | Status: DC
  Administered 2017-06-01: 3.6 [IU]/h via INTRAVENOUS
  Filled 2017-06-01: qty 1

## 2017-06-01 MED ORDER — ONDANSETRON HCL 4 MG/2ML IJ SOLN
4.0000 mg | Freq: Once | INTRAMUSCULAR | Status: AC
  Administered 2017-06-01: 4 mg via INTRAVENOUS
  Filled 2017-06-01: qty 2

## 2017-06-01 MED ORDER — GI COCKTAIL ~~LOC~~
30.0000 mL | Freq: Once | ORAL | Status: AC
  Administered 2017-06-01: 30 mL via ORAL
  Filled 2017-06-01: qty 30

## 2017-06-01 MED ORDER — ENOXAPARIN SODIUM 40 MG/0.4ML ~~LOC~~ SOLN
40.0000 mg | SUBCUTANEOUS | Status: DC
  Administered 2017-06-01 – 2017-06-02 (×2): 40 mg via SUBCUTANEOUS
  Filled 2017-06-01 (×2): qty 0.4

## 2017-06-01 MED ORDER — PROMETHAZINE HCL 25 MG/ML IJ SOLN
12.5000 mg | Freq: Four times a day (QID) | INTRAMUSCULAR | Status: DC | PRN
  Administered 2017-06-01: 25 mg via INTRAVENOUS
  Filled 2017-06-01: qty 1

## 2017-06-01 MED ORDER — SODIUM CHLORIDE 0.9 % IV SOLN
INTRAVENOUS | Status: DC
  Administered 2017-06-01: 20:00:00 via INTRAVENOUS

## 2017-06-01 MED ORDER — SODIUM CHLORIDE 0.9 % IV SOLN
INTRAVENOUS | Status: AC
  Administered 2017-06-01: 20:00:00 via INTRAVENOUS

## 2017-06-01 MED ORDER — PANTOPRAZOLE SODIUM 40 MG IV SOLR
40.0000 mg | Freq: Two times a day (BID) | INTRAVENOUS | Status: DC
  Administered 2017-06-02 – 2017-06-03 (×3): 40 mg via INTRAVENOUS
  Filled 2017-06-01 (×3): qty 40

## 2017-06-01 NOTE — ED Notes (Signed)
Troponin and potassium collected at [facility]1915

## 2017-06-01 NOTE — H&P (Addendum)
History and Physical    Rickey Smith ZOX:[id number] DOB: [birth year] DOA: 06/01/2017  PCP: [facility] Consultants:  None Patient coming from:  Home - lives alone; Rickey Smith: Mom, [phone]  Chief Complaint: DKA  HPI: Rickey Smith is a 51 y.o. male with medical history significant of type 1 DM; HTN; HLD: and h/o polysubstance abuse presenting with DKA.   He reports that he started getting sick with n/v, unable to keep anything down in the middle of the night Friday.  It has continually gotten worse.  He took his insulin yesterday but not this AM because his glucose was about 100 when he got up.  He couldn't eat and was afraid to take it and make it bottom out.  He is experiencing epigastric pain in waves and he can feel it refluxing up into his chest, causing nausea.  He also reports that he is developing a migraine behind both eyes and starting to get light sensitive.  Last use of opiates on Thursday, heroin, snorted.  Last use of cocaine Friday night.  He wants to start back on marijuana but has not used it recently and can't afford it since he is unemployed.   ED Course:  Recurrent DKA, with cocaine and opiate abuse.  Creatinine increased, dehydrated.  Has chest discomfort, burning, c/w his usual esophagitis.  PH 7.1, mentating well.    Review of Systems: As per HPI; otherwise review of systems reviewed and negative.   Ambulatory Status:  Ambulates without assistance  Past Medical History:  Diagnosis Date  . Diabetes mellitus without complication (HCC)   . Migraine   . Neuropathy     Past Surgical History:  Procedure Laterality Date  . TRANSURETHRAL RESECTION OF PROSTATE N/A 08/16/2015   Procedure: TRANSURETHRAL RESECTION DRAINAGE OF PROSTATE ABCESS;  Surgeon: Barron Alvine, MD;  Location: [facility] ORS;  Service: Urology;  Laterality: N/A;    Social History   Socioeconomic History  . Marital status: Single    Spouse name: Not on file  . Number of children:  Not on file  . Years of education: Not on file  . Highest education level: Not on file  Social Needs  . Financial resource strain: Not on file  . Food insecurity - worry: Not on file  . Food insecurity - inability: Not on file  . Transportation needs - medical: Not on file  . Transportation needs - non-medical: Not on file  Occupational History  . Occupation: unemployed  Tobacco Use  . Smoking status: Former Smoker    Packs/day: 0.50    Years: 20.00    Pack years: 10.00    Types: Cigarettes    Last attempt to quit: 02/2017    Years since quitting: 0.2  . Smokeless tobacco: Never Used  Substance and Sexual Activity  . Alcohol use: No  . Drug use: Yes    Types: Cocaine, Heroin  . Sexual activity: Not on file  Other Topics Concern  . 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    Allergies  Allergen Reactions  . Codeine Itching  . Gabapentin Diarrhea and Nausea And Vomiting    Family History  Problem Relation Age of Onset  . Heart attack Unknown   . Prostate cancer Unknown   .  Hypertension Unknown   . Hypertension Mother   . Cancer Father     Prior to Admission medications   Medication Sig Start Date End Date Taking? Authorizing Provider  acetaminophen (TYLENOL) 325 MG tablet Take 650 mg by mouth every 6 (six) hours as needed for mild pain.   Yes [provider]  amitriptyline (ELAVIL) 75 MG tablet Take 1 tablet (75 mg total) by mouth at bedtime. 04/16/17  Yes Danelle EarthlyNoel, Tiffany S, PA-C  atorvastatin (LIPITOR) 40 MG tablet Take 1 tablet (40 mg total) by mouth daily. 04/16/17  Yes Danelle EarthlyNoel, Tiffany S, PA-C  famotidine (PEPCID) 20 MG tablet Take 1 tablet (20 mg total) 2 (two) times daily by mouth. 05/09/17  Yes Pfeiffer, Lebron ConnersMarcy, MD  insulin aspart protamine- aspart (NOVOLOG MIX 70/30) (70-30) 100 UNIT/ML  injection Inject 0.4 mLs (40 Units total) into the skin 2 (two) times daily with a meal. 04/16/17  Yes Danelle EarthlyNoel, Tiffany S, PA-C  lisinopril (PRINIVIL,ZESTRIL) 5 MG tablet Take 2 tablets (10 mg total) by mouth daily. 04/16/17  Yes Danelle EarthlyNoel, Tiffany S, PA-C  ondansetron (ZOFRAN ODT) 4 MG disintegrating tablet Take 1 tablet (4 mg total) every 4 (four) hours as needed by mouth for nausea or vomiting. 05/09/17  Yes Arby BarrettePfeiffer, Marcy, MD  pantoprazole (PROTONIX) 40 MG tablet Take 1 tablet (40 mg total) by mouth daily. 04/16/17  Yes Danelle EarthlyNoel, Tiffany S, PA-C  promethazine (PHENERGAN) 25 MG suppository Place 1 suppository (25 mg total) every 6 (six) hours as needed rectally for nausea or vomiting. 05/09/17  Yes Arby BarrettePfeiffer, Marcy, MD  ondansetron (ZOFRAN-ODT) 4 MG disintegrating tablet Take 1 tablet (4 mg total) by mouth every 8 (eight) hours as needed for nausea or vomiting. Patient not taking: Reported on 06/01/2017 04/03/17   Nyra MarketSvalina, Gorica, MD  pregabalin (LYRICA) 100 MG capsule Take 1 capsule (100 mg total) by mouth 2 (two) times daily. Patient not taking: Reported on 05/09/2017 04/16/17   Renella Cunasoel, Tiffany S, PA-C    Physical Exam: Vitals:   06/01/17 1700 06/01/17 1715 06/01/17 1730 06/01/17 1745  BP: (!) 172/73 (!) 163/82 (!) 152/85 (!) 167/72  Pulse:  (!) 135 (!) 136 (!) 135  Resp: (!) 32 (!) 29 (!) 25 (!) 34  Temp:      TempSrc:      SpO2:  99% 99% 98%  Weight:      Height:         General:  Appears nauseated and generally miserable Eyes:  PERRL, EOMI, normal lids, iris ENT:  grossly normal hearing, lips & tongue, mmm to minimally dry (surprising) Neck:  no LAD, masses or thyromegaly Cardiovascular:  Tachycardia, no m/r/g. No LE edema.  Respiratory:   CTA bilaterally with no wheezes/rales/rhonchi.  Normal respiratory effort. Abdomen:  soft, NT other than in epigastrium, ND, NABS Skin:  no rash or induration seen on limited exam; many tattoos Musculoskeletal:  grossly normal tone BUE/BLE, good ROM, no  bony abnormality Psychiatric:  blunted mood and affect, speech fluent and appropriate, AOx3 Neurologic:  CN 2-12 grossly intact, moves all extremities in coordinated fashion, sensation intact    Radiological Exams on Admission: No results found.  EKG: Independently reviewed.  Sinus tachycardia with rate 134; ST changes likely rate-related/early repolarization abnormality  Labs on Admission: I have personally reviewed the available labs and imaging studies at the time of the admission.  Pertinent labs:   VBG: 7.107/27/9 Na++ 132 K+ 6.2 CO2 9 Glucose 445 BUN 18/Creatinine 1.82/GFR 42; 20/0.9/>60 on 11/16 Anion gap 27 WBC  19.5 Glucose 445, 419 UDS + cocaine, opiates UA: >500 glucose, 80 ketones, rare bacteria   Assessment/Plan Principal Problem:   DKA (diabetic ketoacidosis) (HCC) Active Problems:   Hyperkalemia   ARF (acute renal failure) (HCC)   Polysubstance abuse (HCC)   Esophagitis    DKA -Patient with poor baseline control (A1c 11.4 on 10/11) -No indication of illness as source -Most likely reason for DKA is non-compliance/polysubstance abuse -Moderate DKA on admission based on pH 7.107, HCO3 9, patient alert and possibly a bit drowsy -Will admit to SDU with DKA protocol -Would recommend continuing insulin drip at least until morning regardless of rapidity of closure of gap and normalization of labs -K+ slightly increased at time of presentation but anticipate rapid lowering with IVF and insulin so will follow for now with q4h BMP -IVF at 150 cc/hr, NS until glucose <250 and then decrease rate to 125 and change to D51/2NS -NPO until off insulin drip  Hyperkalemia -As above, K+ is 6.2 but this is likely transcellular shifts and it is expected to improve quickly -Would hold on the ordered Kayexalate from the ER if not already given and recheck K+ now  ARF -Likely due to prerenal failure secondary to dehydration and DKA in the setting of continuation of  ACEI. -Hold ACE for now -IVF as above -Follow up renal function by  q4h BMP -Avoid ACEI and NSAIDs  Polysubstance abuse -Cessation encouraged; this should be encouraged on an ongoing basis -UDS ordered and confirms what patient reports as substances used (time correlation is less certain) -Social work consult  Esophagitis -Likely related to substance abuse, possibly also DKA -Patient is specific in his report about epigastric (not chest) pain -Given GI cocktail in the ER with minimal improvement -Also given Zofran but still vomiting -Will order opiate withdrawal order set -Will give Protonix 80 mg IV x 1 and then 40 mg IV BID -prn Phenergan for breakthrough pain -Low suspicion for ACS given description of symptoms but will cycle troponins and recheck EKG in AM (when he should be less tachycardic - current repolarization/tachycardia abnormalities are present)   DVT prophylaxis:  Lovenox Code Status: Full - confirmed with patient Family Communication: None present Disposition Plan:  Home once clinically improved Consults called: SW Admission status: Admit - It is my clinical opinion that admission to INPATIENT is reasonable and necessary because this patient will require at least 2 midnights in the hospital to treat this condition based on the medical complexity of the problems presented.  Given the aforementioned information, the predictability of an adverse outcome is felt to be significant.    Jonah BlueJennifer Shemeca Lukasik MD [facility]  If note is complete, please contact covering daytime or nighttime physician. [email or web address] Password [email or web address]  06/01/2017, 6:38 PM

## 2017-06-01 NOTE — ED Triage Notes (Signed)
Pt here via [facility] with c/o vomiting since Friday, high blood sugar, fast heart rate, EMS CBG= 407, pt alert x 4 on arrival,  Pt received zofran 8mg  IV enroute per EMS and 750cc NS IV.

## 2017-06-01 NOTE — ED Notes (Addendum)
Consult social work clicked off in error by De NurseKaren Laurencia Roma.

## 2017-06-01 NOTE — ED Provider Notes (Signed)
[facility] EMERGENCY DEPARTMENT Provider Note   CSN: [id number] Arrival date & time: 06/01/17  1531     History   Chief Complaint Chief Complaint  Patient presents with  . Hyperglycemia  . Emesis    HPI Rickey Smith is a 51 y.o. male.  Patient presents for recurrent vomiting associated with high blood sugar.  Transferred by EMS and during transfer received IV saline bolus, 750 cc, for hyperglycemia.  Patient has had frequent similar presentations to the ER with multiple admissions for DKA.  He asserts that he is taking his insulin as directed, 45 units of 7030 twice daily and sliding scale supplementation, with regular, 5-10 units/day.  He also asserts that he is following a normal low carbohydrate diet.  He complains of chest discomfort which feels like heartburn.  He requests antiemetic medication.     HPI  Past Medical History:  Diagnosis Date  . Diabetes mellitus without complication (HCC)   . Migraine     Patient Active Problem List   Diagnosis Date Noted  . DKA, type 2, not at goal [facility]) 12/28/2016  . Uncontrolled type 2 diabetes mellitus with hyperglycemia, with long-term current use of insulin (HCC) 12/28/2016  . Cocaine use 12/28/2016  . Diabetes mellitus due to underlying condition with hyperosmolarity without nonketotic hyperglycemic-hyperosmolar coma [facility]) (HCC) 06/27/2016  . Periodontal disease 05/02/2016  . Tobacco abuse 04/30/2016  . Polysubstance abuse (HCC) 04/30/2016  . Hyperglycemia 04/30/2016  . ARF (acute renal failure) (HCC) 04/23/2016  . Unresponsive episode 04/23/2016  . DKA, type 2 (HCC) 04/23/2016  . Sepsis (HCC) 08/12/2015  . Prostate abscess 08/11/2015  . Abscess 08/11/2015  . UTI (lower urinary tract infection) 08/11/2015  . Diabetic neuropathy (HCC) 08/10/2015  . Arterial hypotension   . Tachycardia 08/02/2015  . SIRS (systemic inflammatory response syndrome) (HCC) 08/02/2015  . GERD (gastroesophageal reflux  disease) 10/26/2014  . Leukocytosis 10/23/2014  . Hyperkalemia 10/23/2014  . Hyperglycemia without ketosis 07/13/2014  . Diabetic hyperosmolar non-ketotic state (HCC) 07/13/2014  . Diabetes mellitus with nonketotic hyperosmolarity (HCC) 07/13/2014  . DKA (diabetic ketoacidoses) (HCC) 04/26/2014  . Chest pain, ROMI, DDX pericarditis 04/26/2014  . Ex-smoker 04/26/2014  . Hyponatremia 04/26/2014  . Metabolic acidosis 04/26/2014    Past Surgical History:  Procedure Laterality Date  . TRANSURETHRAL RESECTION OF PROSTATE N/A 08/16/2015   Procedure: TRANSURETHRAL RESECTION DRAINAGE OF PROSTATE ABCESS;  Surgeon: Barron Alvine, MD;  Location: [facility]S;  Service: Urology;  Laterality: N/A;       Home Medications    Prior to Admission medications   Medication Sig Start Date End Date Taking? Authorizing Provider  acetaminophen (TYLENOL) 325 MG tablet Take 650 mg by mouth every 6 (six) hours as needed for mild pain.   Yes [provider]  amitriptyline (ELAVIL) 75 MG tablet Take 1 tablet (75 mg total) by mouth at bedtime. 04/16/17  Yes Danelle Earthly, Tiffany S, PA-C  atorvastatin (LIPITOR) 40 MG tablet Take 1 tablet (40 mg total) by mouth daily. 04/16/17  Yes Danelle Earthly, Tiffany S, PA-C  famotidine (PEPCID) 20 MG tablet Take 1 tablet (20 mg total) 2 (two) times daily by mouth. 05/09/17  Yes Pfeiffer, Lebron Conners, MD  insulin aspart protamine- aspart (NOVOLOG MIX 70/30) (70-30) 100 UNIT/ML injection Inject 0.4 mLs (40 Units total) into the skin 2 (two) times daily with a meal. 04/16/17  Yes Danelle Earthly, Tiffany S, PA-C  lisinopril (PRINIVIL,ZESTRIL) 5 MG tablet Take 2 tablets (10 mg total) by mouth daily. 04/16/17  Yes  Danelle Earthly, Tiffany S, PA-C  ondansetron (ZOFRAN ODT) 4 MG disintegrating tablet Take 1 tablet (4 mg total) every 4 (four) hours as needed by mouth for nausea or vomiting. 05/09/17  Yes Arby Barrette, MD  pantoprazole (PROTONIX) 40 MG tablet Take 1 tablet (40 mg total) by mouth daily. 04/16/17  Yes Danelle Earthly,  Tiffany S, PA-C  promethazine (PHENERGAN) 25 MG suppository Place 1 suppository (25 mg total) every 6 (six) hours as needed rectally for nausea or vomiting. 05/09/17  Yes Arby Barrette, MD  ondansetron (ZOFRAN-ODT) 4 MG disintegrating tablet Take 1 tablet (4 mg total) by mouth every 8 (eight) hours as needed for nausea or vomiting. Patient not taking: Reported on 06/01/2017 04/03/17   Nyra Market, MD  pregabalin (LYRICA) 100 MG capsule Take 1 capsule (100 mg total) by mouth 2 (two) times daily. Patient not taking: Reported on 05/09/2017 04/16/17   Vivianne Master, PA-C    Family History Family History  Problem Relation Age of Onset  . Heart attack Unknown   . Prostate cancer Unknown   . Hypertension Unknown   . Hypertension Mother   . Cancer Father     Social History Social History   Tobacco Use  . Smoking status: Current Some Day Smoker    Packs/day: 0.50    Years: 20.00    Pack years: 10.00    Types: Cigarettes  . Smokeless tobacco: Never Used  Substance Use Topics  . Alcohol use: No  . Drug use: Yes    Types: Cocaine, Other-see comments    Comment: patient denies 12/27/2016     Allergies   Codeine and Gabapentin   Review of Systems Review of Systems   Physical Exam Updated Vital Signs BP (!) 163/82   Pulse (!) 135   Temp 97.8 F (36.6 C) (Oral)   Resp (!) 29   Ht  (1.854 m)   Wt 71.2 kg (157 lb)   SpO2 99%   BMI 20.71 kg/m   Physical Exam   ED Treatments / Results  Labs (all labs ordered are listed, but only abnormal results are displayed) Labs Reviewed  CBC - Abnormal; Notable for the following components:      Result Value   WBC 19.5 (*)    All other components within normal limits  BASIC METABOLIC PANEL - Abnormal; Notable for the following components:   Sodium 132 (*)    Potassium 6.2 (*)    Chloride 96 (*)    CO2 9 (*)    Glucose, Bld 445 (*)    Creatinine, Ser 1.82 (*)    GFR calc non Af Amer 42 (*)    GFR calc Af Amer 48  (*)    Anion gap 27 (*)    All other components within normal limits  URINALYSIS, ROUTINE W REFLEX MICROSCOPIC - Abnormal; Notable for the following components:   Color, Urine STRAW (*)    Glucose, UA >=500 (*)    Ketones, ur 80 (*)    Bacteria, UA RARE (*)    All other components within normal limits  RAPID URINE DRUG SCREEN, HOSP PERFORMED - Abnormal; Notable for the following components:   Opiates POSITIVE (*)    Cocaine POSITIVE (*)    All other components within normal limits  CBG MONITORING, ED - Abnormal; Notable for the following components:   Glucose-Capillary 374 (*)    All other components within normal limits  I-STAT ARTERIAL BLOOD GAS, ED - Abnormal; Notable for the following components:  pH, Arterial 7.107 (*)    pCO2 arterial 26.7 (*)    pO2, Arterial 27.0 (*)    Bicarbonate 8.4 (*)    TCO2 9 (*)    Acid-base deficit 20.0 (*)    All other components within normal limits  URINE CULTURE  POTASSIUM  POTASSIUM  POTASSIUM  POTASSIUM  NA AND K (SODIUM & POTASSIUM), RAND UR  I-STAT VENOUS BLOOD GAS, ED    EKG  EKG Interpretation  Date/Time:  Sunday June 01 2017 15:42:14 EST Ventricular Rate:  134 PR Interval:    QRS Duration: 92 QT Interval:  309 QTC Calculation: 462 R Axis:   69 Text Interpretation:  Sinus tachycardia LAE, consider biatrial enlargement ST elev, probable normal early repol pattern Since last tracing rate faster and ST abnormality is new Confirmed by Mancel Bale [phone]) on 06/01/2017 5:14:17 PM       Radiology No results found.  Procedures .Critical Care Performed by: Mancel Bale, MD Authorized by: Mancel Bale, MD   Critical care provider statement:    Critical care time (minutes):  50   Critical care start time:  06/01/2017 4:08 PM   Critical care end time:  06/01/2017 5:21 PM   Critical care time was exclusive of:  Separately billable procedures and treating other patients   Critical care was necessary to treat or  prevent imminent or life-threatening deterioration of the following conditions:  Endocrine crisis   Critical care was time spent personally by me on the following activities:  Blood draw for specimens, development of treatment plan with patient or surrogate, evaluation of patient's response to treatment, examination of patient, obtaining history from patient or surrogate, ordering and performing treatments and interventions, ordering and review of laboratory studies, pulse oximetry, re-evaluation of patient's condition and review of old charts     (including critical care time)  Medications Ordered in ED Medications  dextrose 5 %-0.45 % sodium chloride infusion (not administered)  insulin regular (NOVOLIN R,HUMULIN R) 100 Units in sodium chloride 0.9 % 100 mL (1 Units/mL) infusion (not administered)  sodium polystyrene (KAYEXALATE) 15 GM/60ML suspension 15 g (not administered)  ondansetron (ZOFRAN) injection 4 mg (4 mg Intravenous Given 06/01/17 1713)  sodium chloride 0.9 % bolus 1,000 mL (1,000 mLs Intravenous New Bag/Given 06/01/17 1615)     Initial Impression / Assessment and Plan / ED Course  I have reviewed the triage vital signs and the nursing notes.  Pertinent labs & imaging results that were available during my care of the patient were reviewed by me and considered in my medical decision making (see chart for details).  Clinical Course as of Jun 02 1723  Wynelle Link Jun 01, 2017  1633 Low, venous gas, consistent with DKA pH, Arterial: (!!) 7.107 [EW]  1633 Not applicable, venous pCO2 arterial: (!) 26.7 [EW]  1634 Not applicable, venous pO2, Arterial: (!!) 27.0 [EW]  1634 Low, venous Bicarbonate: (!) 8.4 [EW]  1634 Elevated WBC: (!) 19.5 [EW]  1634 Normal Hemoglobin: 14.4 [EW]  1634 Normal WBC, UA: 0-5 [EW]  1634 Trace elevation Bacteria, UA: (!) RARE [EW]  1638 Elevated Pulse Rate: (!) 135 [EW]  1638 Elevated BP: (!) 171/86 [EW]  1639 Elevated Resp: (!) 28 [EW]  1702 Substance of  abuse is present COCAINE: (!) POSITIVE [EW]  1702 Apparent substance of abuse is present Opiates: (!) POSITIVE [EW]  1703 Elevated, with elevated creatinine Potassium: (!) 6.2 [EW]  1703 Low GFR, Est Non African American: (!) 42 [EW]    Clinical  Course User Index [EW] Mancel BaleWentz, Jamese Trauger, MD   BUN  Date Value Ref Range Status  06/01/2017 18 6 - 20 mg/dL Final  16/10/960411/16/2018 20 6 - 20 mg/dL Final  54/09/811910/04/2017 7 6 - 20 mg/dL Final  14/78/295610/03/2017 13 6 - 20 mg/dL Final   Creatinine, Ser  Date Value Ref Range Status  06/01/2017 1.82 (H) 0.61 - 1.24 mg/dL Final  21/30/865711/16/2018 8.460.90 0.61 - 1.24 mg/dL Final  96/29/528410/04/2017 1.320.57 (L) 0.61 - 1.24 mg/dL Final  44/01/027210/03/2017 5.360.73 0.61 - 1.24 mg/dL Final     Patient Vitals for the past 24 hrs:  BP Temp Temp src Pulse Resp SpO2 Height Weight  06/01/17 1715 (!) 163/82 - - (!) 135 (!) 29 99 % - -  06/01/17 1700 (!) 172/73 - - - (!) 32 - - -  06/01/17 1645 (!) 152/86 - - (!) 133 17 99 % - -  06/01/17 1630 (!) 149/74 - - (!) 133 (!) 26 99 % - -  06/01/17 1615 (!) 152/78 - - (!) 133 (!) 23 98 % - -  06/01/17 1600 139/86 - - (!) 132 (!) 23 98 % - -  06/01/17 1545 (!) 146/81 - - (!) 134 (!) 32 99 % - -  06/01/17 1540 (!) 171/86 97.8 F (36.6 C) Oral (!) 135 (!) 28 99 % - -  06/01/17 1537 - - - - - - 6\' 1"  (1.854 m) 71.2 kg (157 lb)    5:21 PM Reevaluation with update and discussion. After initial assessment and treatment, an updated evaluation reveals he continues to complain of a burning chest discomfort, which is felt to be esophagitis, and he would like to try a GI cocktail. Findings discussed with the patient and all questions were answered. Mancel BaleElliott Sruti Ayllon    5:23 PM-Consult complete with hospitalist. Patient case explained and discussed.  She agrees to admit patient for further evaluation and treatment. Call ended at 17: 35   Final Clinical Impressions(s) / ED Diagnoses   Final diagnoses:  Diabetic ketoacidosis without coma associated with type 1 diabetes  mellitus (HCC)  Esophagitis  AKI (acute kidney injury) (HCC)  Hyperkalemia  Cocaine abuse (HCC)  Polysubstance abuse (HCC)   Recurrent DKA, with significant tachycardia, and tachypnea.  Patient with apparent volume depletion associated with polysubstance abuse, and uncontrolled diabetes, now with DKA.  All of these appear to be recurrent problems in this patient.  Doubt ACS, PE, pneumonia, impending vascular collapse.  He will require hospitalization.   Plan: Admit  ED Discharge Orders    None       Mancel BaleWentz, Kelaiah Escalona, MD 06/01/17 2015

## 2017-06-02 ENCOUNTER — Other Ambulatory Visit: Payer: Self-pay

## 2017-06-02 ENCOUNTER — Encounter (HOSPITAL_COMMUNITY): Payer: Self-pay | Admitting: Emergency Medicine

## 2017-06-02 LAB — BASIC METABOLIC PANEL
ANION GAP: 7 (ref 5–15)
Anion gap: 11 (ref 5–15)
Anion gap: 11 (ref 5–15)
Anion gap: 12 (ref 5–15)
Anion gap: 6 (ref 5–15)
BUN: 14 mg/dL (ref 6–20)
BUN: 16 mg/dL (ref 6–20)
BUN: 16 mg/dL (ref 6–20)
BUN: 8 mg/dL (ref 6–20)
BUN: 10 mg/dL (ref 6–20)
CALCIUM: 7.3 mg/dL — AB (ref 8.9–10.3)
CALCIUM: 7.4 mg/dL — AB (ref 8.9–10.3)
CALCIUM: 7.8 mg/dL — AB (ref 8.9–10.3)
CALCIUM: 8 mg/dL — AB (ref 8.9–10.3)
CHLORIDE: 109 mmol/L (ref 101–111)
CHLORIDE: 104 mmol/L (ref 101–111)
CO2: 11 mmol/L — ABNORMAL LOW (ref 22–32)
CO2: 12 mmol/L — ABNORMAL LOW (ref 22–32)
CO2: 12 mmol/L — ABNORMAL LOW (ref 22–32)
CO2: 17 mmol/L — AB (ref 22–32)
CO2: 20 mmol/L — ABNORMAL LOW (ref 22–32)
CREATININE: 0.91 mg/dL (ref 0.61–1.24)
CREATININE: 1.09 mg/dL (ref 0.61–1.24)
CREATININE: 1.39 mg/dL — AB (ref 0.61–1.24)
CREATININE: 0.72 mg/dL (ref 0.61–1.24)
CREATININE: 0.8 mg/dL (ref 0.61–1.24)
Calcium: 7.8 mg/dL — ABNORMAL LOW (ref 8.9–10.3)
Chloride: 108 mmol/L (ref 101–111)
Chloride: 110 mmol/L (ref 101–111)
Chloride: 111 mmol/L (ref 101–111)
GFR calc Af Amer: >60 mL/min (ref >60)
GFR calc Af Amer: >60 mL/min (ref >60)
GFR calc Af Amer: >60 mL/min (ref >60)
GFR calc Af Amer: >60 mL/min (ref >60)
GFR calc non Af Amer: >60 mL/min (ref >60)
GFR calc non Af Amer: >60 mL/min (ref >60)
GFR, EST NON AFRICAN AMERICAN: 58 mL/min — AB (ref >60)
GLUCOSE: 108 mg/dL — AB (ref 65–99)
GLUCOSE: 153 mg/dL — AB (ref 65–99)
Glucose, Bld: 155 mg/dL — ABNORMAL HIGH (ref 65–99)
Glucose, Bld: 167 mg/dL — ABNORMAL HIGH (ref 65–99)
Glucose, Bld: 218 mg/dL — ABNORMAL HIGH (ref 65–99)
POTASSIUM: 4.9 mmol/L (ref 3.5–5.1)
POTASSIUM: 5 mmol/L (ref 3.5–5.1)
Potassium: 4.6 mmol/L (ref 3.5–5.1)
Potassium: 4 mmol/L (ref 3.5–5.1)
Potassium: 3.4 mmol/L — ABNORMAL LOW (ref 3.5–5.1)
SODIUM: 132 mmol/L — AB (ref 135–145)
SODIUM: 133 mmol/L — AB (ref 135–145)
SODIUM: 133 mmol/L — AB (ref 135–145)
SODIUM: 131 mmol/L — AB (ref 135–145)
SODIUM: 132 mmol/L — AB (ref 135–145)

## 2017-06-02 LAB — CBG MONITORING, ED
GLUCOSE-CAPILLARY: 137 mg/dL — AB (ref 65–99)
GLUCOSE-CAPILLARY: 143 mg/dL — AB (ref 65–99)
GLUCOSE-CAPILLARY: 95 mg/dL (ref 65–99)
Glucose-Capillary: 123 mg/dL — ABNORMAL HIGH (ref 65–99)

## 2017-06-02 LAB — TROPONIN I: Troponin I: <0.03 ng/mL (ref <0.03)

## 2017-06-02 LAB — GLUCOSE, CAPILLARY
GLUCOSE-CAPILLARY: 225 mg/dL — AB (ref 65–99)
GLUCOSE-CAPILLARY: 185 mg/dL — AB (ref 65–99)

## 2017-06-02 MED ORDER — SODIUM CHLORIDE 0.45 % IV SOLN
INTRAVENOUS | Status: DC
  Administered 2017-06-02 (×2): via INTRAVENOUS

## 2017-06-02 MED ORDER — ALUM & MAG HYDROXIDE-SIMETH 200-200-20 MG/5ML PO SUSP
30.0000 mL | ORAL | Status: DC | PRN
  Administered 2017-06-02: 30 mL via ORAL
  Filled 2017-06-02: qty 30

## 2017-06-02 MED ORDER — INSULIN ASPART PROT & ASPART (70-30 MIX) 100 UNIT/ML ~~LOC~~ SUSP
30.0000 [IU] | Freq: Every day | SUBCUTANEOUS | Status: DC
  Administered 2017-06-02: 30 [IU] via SUBCUTANEOUS
  Filled 2017-06-02: qty 10

## 2017-06-02 MED ORDER — SODIUM CHLORIDE 0.9 % IV SOLN
1.0000 g | Freq: Once | INTRAVENOUS | Status: AC
  Administered 2017-06-02: 1 g via INTRAVENOUS
  Filled 2017-06-02: qty 10

## 2017-06-02 MED ORDER — INSULIN ASPART PROT & ASPART (70-30 MIX) 100 UNIT/ML ~~LOC~~ SUSP
45.0000 [IU] | SUBCUTANEOUS | Status: AC
  Administered 2017-06-02: 45 [IU] via SUBCUTANEOUS
  Filled 2017-06-02: qty 10

## 2017-06-02 MED ORDER — ACETAMINOPHEN 325 MG PO TABS: 650.0000 mg | ORAL_TABLET | Freq: Four times a day (QID) | ORAL | Status: DC | PRN

## 2017-06-02 MED ORDER — GI COCKTAIL ~~LOC~~
30.0000 mL | Freq: Once | ORAL | Status: DC
  Filled 2017-06-02: qty 30

## 2017-06-02 MED ORDER — INSULIN ASPART PROT & ASPART (70-30 MIX) 100 UNIT/ML ~~LOC~~ SUSP
45.0000 [IU] | Freq: Every day | SUBCUTANEOUS | Status: DC
  Administered 2017-06-03: 45 [IU] via SUBCUTANEOUS

## 2017-06-02 NOTE — ED Notes (Signed)
Advised by MD Corey HaroldKarkrakandy to take pt off insulin drip and start on Novolog 70/30 and eat before given.

## 2017-06-02 NOTE — Progress Notes (Signed)
Inpatient Diabetes Program Recommendations  AACE/ADA: New Consensus Statement on Inpatient Glycemic Control (2015)  Target Ranges:  Prepandial:   less than 140 mg/dL      Peak postprandial:   less than 180 mg/dL (1-2 hours)      Critically ill patients:  140 - 180 mg/dL   Lab Results  Component Value Date   GLUCAP 95 06/02/2017   HGBA1C 11.2 (H) 06/01/2017    Review of Glycemic Control  Diabetes history: ? DM1 Outpatient Diabetes medications: Novolog 70/30 mix 40 units bid Current orders for Inpatient glycemic control: Novolog 70/30 mix 45 units am + 30 units ac supper  Inpatient Diabetes Program Recommendations:   Noted A1c 11.2. Spoke with RN Erin FullingAngela Sanders by phone regarding patient status. Noted CO2 remains low @ 12.  Next BMEt scheduled to be drawn @ 12:00. IV insulin drip has been discontinued and started on 70/30 insulin patient is eating. -Continue to phase 2 DKA orders Will plan to speak with patient tomorrow.,  Thank you, Billy FischerJudy E. Liala Codispoti, RN, MSN, CDE  Diabetes Coordinator Inpatient Glycemic Control Team Team Pager [phone] (8am-5pm) 06/02/2017 8:46 AM

## 2017-06-02 NOTE — ED Notes (Signed)
Pt's CBG 95.  Informed Marylene LandAngela, RN.

## 2017-06-02 NOTE — Progress Notes (Addendum)
1245 Pt up from ED via stretcher. Pt A&Ox4, denies SOB, N/V, c/o heartburn. Pt assessed, see flowsheet and skin checked with Ursula AlertWendi, Charity fundraiserN. Pt oriented to room and updated on POC. Fall precautions in place. WCTM.   1745 MD called for HTN and pain meds. Dr. Allena KatzPatel to enter new orders. Pt resting in bed, WCTM.   1850 RN gave pt chicken broth and crackers. No complaints at this time, pt resting comfortably in bed. Awaiting RN for shift report.

## 2017-06-02 NOTE — Progress Notes (Signed)
[facility] Progress Note  Patient: Rickey HooseWilliam S Armel RUE:[id number]:[id number]   PCP: Patient, No Pcp Per DOB: 02/17/1966   DOA: 06/01/2017   DOS: 06/02/2017   Date of Service: the patient was seen and examined on 06/02/2017  Subjective: Feeling better, no nausea no vomiting.  No fever no chills.  Brief hospital course: Pt. with PMH of type I DM, HTN, HLD, substance abuse; admitted on 06/01/2017, presented with complaint of nausea, was found to have DKA. Currently further plan is continue IV fluids.  Assessment and Plan: DKA -Patient with poor baseline control (A1c 11.4 on 10/11) -No indication of illness as source -Most likely reason for DKA is non-compliance/polysubstance abuse -Moderate DKA on admission based on pH 7.107, HCO3 9, patient alert and possibly a bit drowsy -Given IV insulin.  Now transition to home long and short-acting insulin.  Also on oral diet. Still bicarb low, will continue with IV fluids.  Monitor BMP. -Abused cocaine most likely the cause of his DKA.  Hyperkalemia -As above, K+ is 6.2 but this is likely transcellular shifts Resolved.  ARF -Likely due to prerenal failure secondary to dehydration and DKA in the setting of continuation of ACEI. -Hold ACE for now -IVF as above -Better monitor -Avoid ACEI and NSAIDs  Polysubstance abuse -Cessation encouraged; this should be encouraged on an ongoing basis -UDS ordered and confirms what patient reports as substances used (time correlation is less certain) -Social work consult  Esophagitis -Likely related to substance abuse, possibly also DKA -Patient is specific in his report about epigastric (not chest) pain -Given GI cocktail in the ER with minimal improvement -Also given Zofran but still vomiting -Will order opiate withdrawal order set  -Will give Protonix 80 mg IV x 1 and then 40 mg IV BID  Diet: carb modiifed DVT Prophylaxis: subcutaneous Heparin  Advance goals of care discussion: full code  Family  Communication: no family was present at bedside, at the time of interview.   Disposition:  Discharge to home.  Consultants: none Procedures: none  Antibiotics: Anti-infectives (From admission, onward)   None       Objective: Physical Exam: Vitals:   06/02/17 0830 06/02/17 1100 06/02/17 1130 06/02/17 1200  BP: 139/87 (!) 144/86 (!) 146/95 (!) 145/85  Pulse: (!) 110 (!) 105 96 95  Resp: 17 (!) 21 19 17   Temp:      TempSrc:      SpO2: 99% 100% 100% 99%  Weight:      Height:        Intake/Output Summary (Last 24 hours) at 06/02/2017 1614 Last data filed at 06/02/2017 1200 Gross per 24 hour  Intake 4100 ml  Output 1900 ml  Net 2200 ml   Filed Weights   06/01/17 1537  Weight: 71.2 kg (157 lb)   General: Alert, Awake and Oriented to Time, Place and Person. Appear in no distress, affect appropriate Eyes: PERRL, Conjunctiva normal ENT: Oral Mucosa clear moist. Neck: no JVD, no Abnormal Mass Or lumps Cardiovascular: S1 and S2 Present, no Murmur, Peripheral Pulses Present Respiratory: normal respiratory effort, Bilateral Air entry equal and Decreased, no use of accessory muscle, Clear to Auscultation, no Crackles, no wheezes Abdomen: Bowel Sound present, Soft and no tenderness, no hernia Skin: no redness, no Rash, no induration Extremities: no Pedal edema, no calf tenderness Neurologic: Grossly no focal neuro deficit. Bilaterally Equal motor strength  Data Reviewed: CBC: Recent Labs  Lab 06/01/17 1608  WBC 19.5*  HGB 14.4  HCT 44.5  MCV 91.9  PLT 341   Basic Metabolic Panel: Recent Labs  Lab 06/01/17 1936 06/01/17 2340 06/02/17 0309 06/02/17 0746 06/02/17 1251  NA 135 133* 132* 133* 132*  K 5.4* 5.0 4.9 4.6 4.0  CL 103 111 108 110 109  CO2 <7* 11* 12* 12* 17*  GLUCOSE 363* 155* 167* 108* 153*  BUN 21* 16 16 14 10   CREATININE 1.76* 1.39* 1.09 0.91 0.80  CALCIUM 8.1* 7.3* 7.8* 7.4* 7.8*    Liver Function Tests: No results for input(s): AST, ALT,  ALKPHOS, BILITOT, PROT, ALBUMIN in the last 168 hours. No results for input(s): LIPASE, AMYLASE in the last 168 hours. No results for input(s): AMMONIA in the last 168 hours. Coagulation Profile: No results for input(s): INR, PROTIME in the last 168 hours. Cardiac Enzymes: Recent Labs  Lab 06/01/17 1909 06/01/17 2340 06/02/17 0552  TROPONINI <0.03 <0.03 <0.03   BNP (last 3 results) No results for input(s): PROBNP in the last 8760 hours. CBG: Recent Labs  Lab 06/01/17 2309 06/02/17 0015 06/02/17 0119 06/02/17 0708 06/02/17 1233  GLUCAP 147* 137* 143* 95 123*   Studies: No results found.  Scheduled Meds: . amitriptyline  75 mg Oral QHS  . atorvastatin  40 mg Oral Daily  . enoxaparin (LOVENOX) injection  40 mg Subcutaneous Q24H  . famotidine  20 mg Oral BID  . insulin aspart protamine- aspart  30 Units Subcutaneous Q supper  . insulin aspart protamine- aspart  45 Units Subcutaneous Q breakfast  . pantoprazole (PROTONIX) IV  40 mg Intravenous Q12H   Continuous Infusions: . sodium chloride 75 mL/hr at 06/02/17 0311   PRN Meds: alum & mag hydroxide-simeth, dicyclomine, hydrOXYzine, loperamide, methocarbamol, naproxen, promethazine  Time spent: 35 minutes  Author: Lynden OxfordPranav Owen Pratte, MD [facility] Pager: [phone] 06/02/2017 4:14 PM  If 7PM-7AM, please contact night-coverage at [email or web address], password [email or web address]

## 2017-06-03 LAB — GLUCOSE, CAPILLARY
Glucose-Capillary: 60 mg/dL — ABNORMAL LOW (ref 65–99)
Glucose-Capillary: 72 mg/dL (ref 65–99)
Glucose-Capillary: 111 mg/dL — ABNORMAL HIGH (ref 65–99)

## 2017-06-03 LAB — URINE CULTURE
Culture: <10000 — AB
SPECIAL REQUESTS: NORMAL

## 2017-06-03 MED ORDER — SUCRALFATE 1 G PO TABS: 1.0000 g | ORAL_TABLET | Freq: Three times a day (TID) | ORAL | 0 refills | Status: DC

## 2017-06-03 MED ORDER — ONDANSETRON 4 MG PO TBDP: 4.0000 mg | ORAL_TABLET | ORAL | 0 refills | Status: DC | PRN

## 2017-06-03 MED ORDER — PANTOPRAZOLE SODIUM 40 MG PO TBEC: 40.0000 mg | DELAYED_RELEASE_TABLET | Freq: Two times a day (BID) | ORAL | 0 refills | Status: DC

## 2017-06-03 MED ORDER — SUCRALFATE 1 G PO TABS
1.0000 g | ORAL_TABLET | Freq: Three times a day (TID) | ORAL | Status: DC
  Administered 2017-06-03 (×2): 1 g via ORAL
  Filled 2017-06-03 (×2): qty 1

## 2017-06-03 NOTE — Progress Notes (Signed)
Spoke with patient. Was diagnosed in 2007. Has been on insulin since then. [facility] States that he takes [employer] insulin 70/30 45 units at breakfast, 30 units at dinner. Does takes Regular insulin SS with meals. States that he checks blood sugars 5-7 times per day. [facility]Given information on HgbA1C, general DM care.  Smith MinceKendra Valdez Brannan RN BSN CDE Diabetes Coordinator Pager: [phone]  8am-5pm

## 2017-06-10 NOTE — Discharge Summary (Signed)
[facility] Discharge Summary   Patient: Rickey HooseWilliam S Klaiber ZOX:[id number]   PCP: Patient, No Pcp Per DOB: 03/27/1966   Date of admission: 06/01/2017   Date of discharge: 06/03/2017     Discharge Diagnoses:  Principal Problem:   DKA (diabetic ketoacidosis) (HCC) Active Problems:   Hyperkalemia   ARF (acute renal failure) (HCC)   Polysubstance abuse (HCC)   Esophagitis   Admitted From: home Disposition:  home  Recommendations for Outpatient Follow-up:  1. Please follow-up with PCP in 1 week  Follow-up Information    PCP. Schedule an appointment as soon as possible for a visit in 1 week(s).          Diet recommendation: Cardiac diet carb modified  Activity: The patient is advised to gradually reintroduce usual activities.  Discharge Condition: good  Code Status: Full code  History of present illness: As per the H and P dictated on admission, "Rickey HooseWilliam S Jayne is a 51 y.o. male with medical history significant of type 1 DM; HTN; HLD: and h/o polysubstance abuse presenting with DKA.   He reports that he started getting sick with n/v, unable to keep anything down in the middle of the night Friday.  It has continually gotten worse.  He took his insulin yesterday but not this AM because his glucose was about 100 when he got up.  He couldn't eat and was afraid to take it and make it bottom out.  He is experiencing epigastric pain in waves and he can feel it refluxing up into his chest, causing nausea.  He also reports that he is developing a migraine behind both eyes and starting to get light sensitive.  Last use of opiates on Thursday, heroin, snorted.  Last use of cocaine Friday night.  He wants to start back on marijuana but has not used it recently and can't afford it since he is unemployed  "  Hospital Course:  Summary of his active problems in the hospital is as following. DKA -Patient with poor baseline control (A1c 11.4 on 10/11) -No indication of illness as  source -Most likelyreason for DKAis non-compliance/polysubstance abuse -Moderate DKA on admission based on pH 7.107, HCO39, patient alert and possibly a bit drowsy -Given IV insulin.  Now transition to home long and short-acting insulin.  Also on oral diet. Still bicarb low, will continue with IV fluids.  Monitor BMP. -Abused cocaine most likely the cause of his DKA.  Hyperkalemia -As above, K+ is 6.2 but this is likely transcellular shifts Resolved.  ARF, resolved -Likely due to prerenal failure secondary to dehydration andDKA in the setting ofcontinuation of ACEI. -HoldACEfor now  Polysubstance abuse -Cessation encouraged; this should be encouraged on an ongoing basis -UDS orderedand confirms what patient reports as substances used (time correlation is less certain) -Social work consult  Esophagitis -Likely related to substance abuse, possibly also DKA -Patient is specific in his report about epigastric (not chest) pain -Patient was able to tolerate oral diet, continue current regimen at home as well.   All other chronic medical condition were stable during the hospitalization.  Patient was ambulatory without any assistance. On the day of the discharge the patient's vitals were stable, and no other acute medical condition were reported by patient. the patient was felt safe to be discharge at home with family.  Procedures and Results:  none   Consultations:  none  DISCHARGE MEDICATION: Allergies as of 06/03/2017      Reactions   Codeine Itching   Gabapentin Diarrhea,  Nausea And Vomiting      Medication List    TAKE these medications   acetaminophen 325 MG tablet Commonly known as:  TYLENOL Take 650 mg by mouth every 6 (six) hours as needed for mild pain.   amitriptyline 75 MG tablet Commonly known as:  ELAVIL Take 1 tablet (75 mg total) by mouth at bedtime.   atorvastatin 40 MG tablet Commonly known as:  LIPITOR Take 1 tablet (40 mg total) by  mouth daily.   famotidine 20 MG tablet Commonly known as:  PEPCID Take 1 tablet (20 mg total) 2 (two) times daily by mouth.   insulin aspart protamine- aspart (70-30) 100 UNIT/ML injection Commonly known as:  NOVOLOG MIX 70/30 Inject 0.4 mLs (40 Units total) into the skin 2 (two) times daily with a meal.   lisinopril 5 MG tablet Commonly known as:  PRINIVIL,ZESTRIL Take 2 tablets (10 mg total) by mouth daily.   ondansetron 4 MG disintegrating tablet Commonly known as:  ZOFRAN ODT Take 1 tablet (4 mg total) by mouth every 4 (four) hours as needed for nausea or vomiting.   pantoprazole 40 MG tablet Commonly known as:  PROTONIX Take 1 tablet (40 mg total) by mouth 2 (two) times daily before a meal. What changed:  when to take this   promethazine 25 MG suppository Commonly known as:  PHENERGAN Place 1 suppository (25 mg total) every 6 (six) hours as needed rectally for nausea or vomiting.   sucralfate 1 g tablet Commonly known as:  CARAFATE Take 1 tablet (1 g total) by mouth 4 (four) times daily -  with meals and at bedtime.      Allergies  Allergen Reactions  . Codeine Itching  . Gabapentin Diarrhea and Nausea And Vomiting   Discharge Instructions    Diet Carb Modified   Complete by:  As directed    Discharge instructions   Complete by:  As directed    It is important that you read following instructions as well as go over your medication list with RN to help you understand your care after this hospitalization.  Discharge Instructions: Please follow-up with PCP in one week  Please request your primary care physician to go over all Hospital Tests and Procedure/Radiological results at the follow up,  Please get all Hospital records sent to your PCP by signing hospital release before you go home.   Do not take more than prescribed Pain, Sleep and Anxiety Medications. You were cared for by a hospitalist during your hospital stay. If you have any questions about your  discharge medications or the care you received while you were in the hospital after you are discharged, you can call the unit and ask to speak with the hospitalist on call if the hospitalist that took care of you is not available.  Once you are discharged, your primary care physician will handle any further medical issues. Please note that NO REFILLS for any discharge medications will be authorized once you are discharged, as it is imperative that you return to your primary care physician (or establish a relationship with a primary care physician if you do not have one) for your aftercare needs so that they can reassess your need for medications and monitor your lab values. You Must read complete instructions/literature along with all the possible adverse reactions/side effects for all the Medicines you take and that have been prescribed to you. Take any new Medicines after you have completely understood and accept all the possible adverse  reactions/side effects. Wear Seat belts while driving. If you have smoked or chewed Tobacco in the last 2 yrs please stop smoking and/or stop any Recreational drug use.   Increase activity slowly   Complete by:  As directed      Discharge Exam: Filed Weights   06/01/17 1537 06/03/17 0511  Weight: 71.2 kg (157 lb) 72.3 kg (159 lb 6.3 oz)   Vitals:   06/03/17 0511 06/03/17 0857  BP: 129/82 (!) 155/92  Pulse: 80 (!) 103  Resp: 18 19  Temp: 98.6 F (37 C) 98.7 F (37.1 C)  SpO2: 99% 99%   General: Appear in no distress, no Rash; Oral Mucosa moist. Cardiovascular: S1 and S2 Present, no Murmur, no JVD Respiratory: Bilateral Air entry present and Clear to Auscultation, no Crackles, no wheezes Abdomen: Bowel Sound present, Soft and no tenderness Extremities: no Pedal edema, no calf tenderness Neurology: Grossly no focal neuro deficit.  The results of significant diagnostics from this hospitalization (including imaging, microbiology, ancillary and  laboratory) are listed below for reference.    Significant Diagnostic Studies: No results found.  Microbiology: Recent Results (from the past 240 hour(s))  Urine culture     Status: Abnormal   Collection Time: 06/01/17  3:39 PM  Result Value Ref Range Status   Specimen Description URINE, RANDOM  Final   Special Requests Normal  Final   Culture <10,000 COLONIES/mL INSIGNIFICANT GROWTH (A)  Final   Report Status 06/03/2017 FINAL  Final     Labs: CBC: No results for input(s): WBC, NEUTROABS, HGB, HCT, MCV, PLT in the last 168 hours. Basic Metabolic Panel: No results for input(s): NA, K, CL, CO2, GLUCOSE, BUN, CREATININE, CALCIUM, MG, PHOS in the last 168 hours. Liver Function Tests: No results for input(s): AST, ALT, ALKPHOS, BILITOT, PROT, ALBUMIN in the last 168 hours. No results for input(s): LIPASE, AMYLASE in the last 168 hours. No results for input(s): AMMONIA in the last 168 hours. Cardiac Enzymes: No results for input(s): CKTOTAL, CKMB, CKMBINDEX, TROPONINI in the last 168 hours. BNP (last 3 results) No results for input(s): BNP in the last 8760 hours. CBG: Recent Labs  Lab 06/03/17 0757 06/03/17 1156  GLUCAP 72 111*   Time spent: 35 minutes  Signed:  Lynden Oxford  [facility] 06/03/2017 , 7:41 AM

## 2017-06-26 ENCOUNTER — Ambulatory Visit: Payer: Self-pay | Attending: Internal Medicine | Admitting: Internal Medicine

## 2017-06-26 ENCOUNTER — Encounter: Payer: Self-pay | Admitting: Internal Medicine

## 2017-06-26 VITALS — BP 110/76 | HR 120 | Temp 98.2°F | Resp 16 | Wt 152.2 lb

## 2017-06-26 DIAGNOSIS — Z87891 Personal history of nicotine dependence: Secondary | ICD-10-CM | POA: Insufficient documentation

## 2017-06-26 DIAGNOSIS — Z1211 Encounter for screening for malignant neoplasm of colon: Secondary | ICD-10-CM | POA: Insufficient documentation

## 2017-06-26 DIAGNOSIS — E1110 Type 2 diabetes mellitus with ketoacidosis without coma: Secondary | ICD-10-CM | POA: Insufficient documentation

## 2017-06-26 DIAGNOSIS — E1100 Type 2 diabetes mellitus with hyperosmolarity without nonketotic hyperglycemic-hyperosmolar coma (NKHHC): Secondary | ICD-10-CM | POA: Insufficient documentation

## 2017-06-26 DIAGNOSIS — F1910 Other psychoactive substance abuse, uncomplicated: Secondary | ICD-10-CM

## 2017-06-26 DIAGNOSIS — K219 Gastro-esophageal reflux disease without esophagitis: Secondary | ICD-10-CM | POA: Insufficient documentation

## 2017-06-26 DIAGNOSIS — E1140 Type 2 diabetes mellitus with diabetic neuropathy, unspecified: Secondary | ICD-10-CM | POA: Insufficient documentation

## 2017-06-26 DIAGNOSIS — Z794 Long term (current) use of insulin: Secondary | ICD-10-CM | POA: Insufficient documentation

## 2017-06-26 DIAGNOSIS — E1165 Type 2 diabetes mellitus with hyperglycemia: Secondary | ICD-10-CM | POA: Insufficient documentation

## 2017-06-26 DIAGNOSIS — F1911 Other psychoactive substance abuse, in remission: Secondary | ICD-10-CM | POA: Insufficient documentation

## 2017-06-26 DIAGNOSIS — Z79899 Other long term (current) drug therapy: Secondary | ICD-10-CM | POA: Insufficient documentation

## 2017-06-26 DIAGNOSIS — R000 Tachycardia, unspecified: Secondary | ICD-10-CM | POA: Insufficient documentation

## 2017-06-26 DIAGNOSIS — Z2821 Immunization not carried out because of patient refusal: Secondary | ICD-10-CM | POA: Insufficient documentation

## 2017-06-26 DIAGNOSIS — I10 Essential (primary) hypertension: Secondary | ICD-10-CM | POA: Insufficient documentation

## 2017-06-26 DIAGNOSIS — E785 Hyperlipidemia, unspecified: Secondary | ICD-10-CM | POA: Insufficient documentation

## 2017-06-26 LAB — GLUCOSE, POCT (MANUAL RESULT ENTRY): POC GLUCOSE: 283 mg/dL — AB (ref 70–99)

## 2017-06-26 MED ORDER — LOVASTATIN 20 MG PO TABS
20.0000 mg | ORAL_TABLET | Freq: Every day | ORAL | 3 refills | Status: DC
Start: 2017-06-26 — End: 2017-08-20

## 2017-06-26 NOTE — Progress Notes (Signed)
Patient ID: Rickey Smith, male    DOB: 03/27/1966  MRN: [id number]  CC: Hospitalization Follow-up and Establish Care   Subjective: Rickey Smith is a 52 y.o. male who presents to become est with me as PCP. Last saw Dr. Julien Smith 08/2015. His concerns today include:  Pt with hx of HL, DM type 2 with neuropathy, HTN, polysubstance abuse.    Saw our PA 03/2017 post-hosp for DKA. A1C at the time was 11.2  1. DM: checking BS 5 x a day. Did not bring log or meter. Gives range 150-160. High today because he just ate about 1 hr ago. Taking Novolog 70/30 mix insulin 45 a.m and 40 p.m.  Also has Regular insulin at home which he takes PRN. Has to take 5-10 units about 3 x a wk.  -few hypoglycemic episodes, usually around lunch and dinner times -Eating Habits: eating more carbs like mash potatoes. Had all teeth removed several wks ago; had final fitting for dentures was yesterday and hopes to have them in 2-3 wks. -Exercise: "'m always up trying to do something." -due for eye exam. No insurance -on Elavil for neuropathy in feet.   2. Substance abuse:  "I''m done with all of it." Did not do a treatment program. Feels he was not using enough to warrant treatment program.  3.  HL:  Not at goal on last visit. Not able to afford the Lipitor.  4. HTN: not taking Lisinopril. He forgot he had this med  HM: due for colon CA scree. Declines Tdap.  Patient Active Problem List   Diagnosis Date Noted  . DKA (diabetic ketoacidosis) (HCC) 06/01/2017  . Esophagitis 06/01/2017  . Uncontrolled type 2 diabetes mellitus with hyperglycemia, with long-term current use of insulin (HCC) 12/28/2016  . Cocaine use 12/28/2016  . Diabetes mellitus due to underlying condition with hyperosmolarity without nonketotic hyperglycemic-hyperosmolar coma [facility]) (HCC) 06/27/2016  . Periodontal disease 05/02/2016  . Tobacco abuse 04/30/2016  . Polysubstance abuse (HCC) 04/30/2016  . ARF (acute renal failure) (HCC)  04/23/2016  . Unresponsive episode 04/23/2016  . Sepsis (HCC) 08/12/2015  . Prostate abscess 08/11/2015  . Abscess 08/11/2015  . UTI (lower urinary tract infection) 08/11/2015  . Diabetic neuropathy (HCC) 08/10/2015  . Arterial hypotension   . Tachycardia 08/02/2015  . SIRS (systemic inflammatory response syndrome) (HCC) 08/02/2015  . GERD (gastroesophageal reflux disease) 10/26/2014  . Leukocytosis 10/23/2014  . Hyperkalemia 10/23/2014  . DKA (diabetic ketoacidoses) (HCC) 04/26/2014  . Chest pain, ROMI, DDX pericarditis 04/26/2014  . Ex-smoker 04/26/2014  . Hyponatremia 04/26/2014  . Metabolic acidosis 04/26/2014     Current Outpatient Medications on File Prior to Visit  Medication Sig Dispense Refill  . acetaminophen (TYLENOL) 325 MG tablet Take 650 mg by mouth every 6 (six) hours as needed for mild pain.    Marland Kitchen amitriptyline (ELAVIL) 75 MG tablet Take 1 tablet (75 mg total) by mouth at bedtime. 30 tablet 3  . atorvastatin (LIPITOR) 40 MG tablet Take 1 tablet (40 mg total) by mouth daily. 30 tablet 3  . famotidine (PEPCID) 20 MG tablet Take 1 tablet (20 mg total) 2 (two) times daily by mouth. 30 tablet 0  . insulin aspart protamine- aspart (NOVOLOG MIX 70/30) (70-30) 100 UNIT/ML injection Inject 0.4 mLs (40 Units total) into the skin 2 (two) times daily with a meal. 10 mL 11  . lisinopril (PRINIVIL,ZESTRIL) 5 MG tablet Take 2 tablets (10 mg total) by mouth daily. 30 tablet 11  . ondansetron (  ZOFRAN ODT) 4 MG disintegrating tablet Take 1 tablet (4 mg total) by mouth every 4 (four) hours as needed for nausea or vomiting. 20 tablet 0  . pantoprazole (PROTONIX) 40 MG tablet Take 1 tablet (40 mg total) by mouth 2 (two) times daily before a meal. 30 tablet 0  . promethazine (PHENERGAN) 25 MG suppository Place 1 suppository (25 mg total) every 6 (six) hours as needed rectally for nausea or vomiting. 6 suppository 1  . sucralfate (CARAFATE) 1 g tablet Take 1 tablet (1 g total) by mouth 4  (four) times daily -  with meals and at bedtime. 30 tablet 0   No current facility-administered medications on file prior to visit.     Allergies  Allergen Reactions  . Codeine Itching  . Gabapentin Diarrhea and Nausea And Vomiting    Social History   Socioeconomic History  . Marital status: Single    Spouse name: Not on file  . Number of children: Not on file  . Years of education: Not on file  . Highest education level: Not on file  Social Needs  . Financial resource strain: Not on file  . Food insecurity - worry: Not on file  . Food insecurity - inability: Not on file  . Transportation needs - medical: Not on file  . Transportation needs - non-medical: Not on file  Occupational History  . Occupation: unemployed  Tobacco Use  . Smoking status: Former Smoker    Packs/day: 0.50    Years: 20.00    Pack years: 10.00    Types: Cigarettes    Last attempt to quit: 02/2017    Years since quitting: 0.3  . Smokeless tobacco: Never Used  Substance and Sexual Activity  . Alcohol use: No  . Drug use: Yes    Types: Cocaine, Heroin  . Sexual activity: Not on file  Other Topics Concern  . 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    Family History  Problem Relation Age of Onset  . Heart attack Unknown   . Prostate cancer Unknown   . Hypertension Unknown   . Hypertension Mother   . Cancer Father     Past Surgical History:  Procedure Laterality Date  . TRANSURETHRAL RESECTION OF PROSTATE N/A 08/16/2015   Procedure: TRANSURETHRAL RESECTION DRAINAGE OF PROSTATE ABCESS;  Surgeon: Barron Alvine, MD;  Location: [facility] ORS;  Service: Urology;  Laterality: N/A;    ROS: Review of Systems Negative except as above PHYSICAL EXAM: BP 110/76   Pulse (!) 120   Temp 98.2 F (36.8 C) (Oral)   Resp 16   Wt 152 lb 3.2  oz (69 kg)   SpO2 98%   BMI 20.08 kg/m   Physical Exam  General appearance - alert, well appearing, and in no distress Mental status - alert, oriented to person, place, and time, normal mood, behavior, speech, dress, motor activity, and thought processes Neck - supple, no significant adenopathy Chest - clear to auscultation, no wheezes, rales or rhonchi, symmetric air entry Heart - tachy but regular normal S1, S2, no murmurs, rubs, clicks or gallops Extremities - peripheral pulses normal, no pedal edema, no clubbing or cyanosis Diabetic Foot Exam - Simple   Simple Foot Form Visual Inspection See comments:  Yes Sensation Testing See  comments:  Yes Pulse Check Posterior Tibialis and Dorsalis pulse intact bilaterally:  Yes Comments Toenails discolored.  No ulcers on the sole of the feet.  Leap exam abnormal on the soles.     Lab Results  Component Value Date   HGBA1C 11.2 (H) 06/01/2017   Lab Results  Component Value Date   WBC 19.5 (H) 06/01/2017   HGB 14.4 06/01/2017   HCT 44.5 06/01/2017   MCV 91.9 06/01/2017   PLT 341 06/01/2017     Chemistry      Component Value Date/Time   NA 131 (L) 06/02/2017 2022   K 3.4 (L) 06/02/2017 2022   CL 104 06/02/2017 2022   CO2 20 (L) 06/02/2017 2022   BUN 8 06/02/2017 2022   CREATININE 0.72 06/02/2017 2022      Component Value Date/Time   CALCIUM 8.0 (L) 06/02/2017 2022   ALKPHOS 78 05/09/2017 1732   AST 11 (L) 05/09/2017 1732   ALT 12 (L) 05/09/2017 1732   BILITOT 0.9 05/09/2017 1732     Lab Results  Component Value Date   CHOL 239 (H) 12/28/2016   HDL 47 12/28/2016   LDLCALC 170 (H) 12/28/2016   TRIG 110 12/28/2016   CHOLHDL 5.1 12/28/2016   Results for orders placed or performed in visit on 06/26/17  POCT glucose (manual entry)  Result Value Ref Range   POC Glucose 283 (A) 70 - 99 mg/dl     ASSESSMENT AND PLAN: 1. Uncontrolled type 2 diabetes mellitus with hyperglycemia, with long-term current use of insulin  (HCC) Reported blood sugars have improved. Discussed the importance of healthy eating, regular exercise, and compliance with medication and controlling diabetes.  We will have him follow-up with Stacy in 1 month.  Patient advised to keep a log of blood sugar readings and bring with him on that visit. -[facility]our[facility] - POCT glucose (manual entry) - Basic Metabolic Panel - Microalbumin / creatinine urine ratio  2. Tachycardia -Check BMP today to make sure no DKA.  Will also check TSH.  Other possibility may be substance use. - TSH  3. Polysubstance abuse (HCC) -Reportedly in remission.  Encourage him to remain free of drugs as it adversely affects his other health issues  4. Hyperlipidemia, unspecified hyperlipidemia type -Lovastatin may be on the [facility] plan at [facility].  Will switch Lipitor to lovastatin. - lovastatin (MEVACOR) 20 MG tablet; Take 1 tablet (20 mg total) by mouth at bedtime.  Dispense: 90 tablet; Refill: 3  5. Hypertension, unspecified type Controlled off of med.  Will observe for now.  Will check urine microalbumin at  6. Colon cancer screening - Fecal occult blood, imunochemical  7. Tetanus, diphtheria, and acellular pertussis (Tdap) vaccination declined   Patient was given the opportunity to ask questions.  Patient verbalized understanding of the plan and was able to repeat key elements of the plan.   Orders Placed This Encounter  Procedures  . POCT glucose (manual entry)     Requested Prescriptions    No prescriptions requested or ordered in this encounter    No Follow-up on file.  Jonah Blueeborah Johnson, MD, FACP

## 2017-06-26 NOTE — Patient Instructions (Addendum)
Give follow up appointment with Stacy for DM recheck in 1 month.   Please bring your glucometer or log of blood sugar readings on your next visit.   STOP the LISINOPRIL for now.  I have sent a prescription to your pharmacy for Lovastatin for the cholesterol.   Follow a Healthy Eating Plan - You can do it! Limit sugary drinks.  Avoid sodas, sweet tea, sport or energy drinks, or fruit drinks.  Drink water, lo-fat milk, or diet drinks. Limit snack foods.   Cut back on candy, cake, cookies, chips, ice cream.  These are a special treat, only in small amounts. Eat plenty of vegetables.  Especially dark green, red, and orange vegetables. Aim for at least 3 servings a day. More is better! Include fruit in your daily diet.  Whole fruit is much healthier than fruit juice! Limit "white" bread, "white" pasta, "white" rice.   Choose "100% whole grain" products, brown or wild rice. Avoid fatty meats. Try "Meatless Monday" and choose eggs or beans one day a week.  When eating meat, choose lean meats like chicken, [place]key, and fish.  Grill, broil, or bake meats instead of frying, and eat poultry without the skin. Eat less salt.  Avoid frozen pizzas, frozen dinners and salty foods.  Use seasonings other than salt in cooking.  This can help blood pressure and keep you from swelling Beer, wine and liquor have calories.  If you can safely drink alcohol, limit to 1 drink per day for women, 2 drinks for men

## 2017-06-27 LAB — BASIC METABOLIC PANEL
BUN/Creatinine Ratio: 20 (ref 9–20)
BUN: 19 mg/dL (ref 6–24)
CALCIUM: 9.1 mg/dL (ref 8.7–10.2)
CHLORIDE: 99 mmol/L (ref 96–106)
CO2: 22 mmol/L (ref 20–29)
Creatinine, Ser: 0.93 mg/dL (ref 0.76–1.27)
GFR calc Af Amer: 110 mL/min/{1.73_m2} (ref >59)
GFR calc non Af Amer: 95 mL/min/{1.73_m2} (ref >59)
GLUCOSE: 289 mg/dL — AB (ref 65–99)
POTASSIUM: 4.7 mmol/L (ref 3.5–5.2)
Sodium: 136 mmol/L (ref 134–144)

## 2017-06-27 LAB — TSH: TSH: 2.14 u[IU]/mL (ref 0.450–4.500)

## 2017-06-27 LAB — MICROALBUMIN / CREATININE URINE RATIO
CREATININE, UR: 127.4 mg/dL
MICROALB/CREAT RATIO: 7.5 mg/g{creat} (ref 0.0–30.0)
MICROALBUM., U, RANDOM: 9.5 ug/mL

## 2017-07-29 ENCOUNTER — Encounter: Payer: Self-pay | Admitting: Pharmacist

## 2017-08-17 ENCOUNTER — Inpatient Hospital Stay (HOSPITAL_COMMUNITY)
Admission: EM | Admit: 2017-08-17 | Discharge: 2017-08-21 | DRG: 637 | Disposition: A | Discharge status: Home / Self Care | Payer: Self-pay | Attending: Internal Medicine | Admitting: Internal Medicine

## 2017-08-17 ENCOUNTER — Inpatient Hospital Stay (HOSPITAL_COMMUNITY): Payer: Self-pay

## 2017-08-17 ENCOUNTER — Encounter (HOSPITAL_COMMUNITY): Payer: Self-pay | Admitting: Emergency Medicine

## 2017-08-17 DIAGNOSIS — G9341 Metabolic encephalopathy: Secondary | ICD-10-CM | POA: Diagnosis present

## 2017-08-17 DIAGNOSIS — E860 Dehydration: Secondary | ICD-10-CM | POA: Diagnosis present

## 2017-08-17 DIAGNOSIS — J209 Acute bronchitis, unspecified: Secondary | ICD-10-CM | POA: Diagnosis present

## 2017-08-17 DIAGNOSIS — Z9119 Patient's noncompliance with other medical treatment and regimen: Secondary | ICD-10-CM

## 2017-08-17 DIAGNOSIS — R6510 Systemic inflammatory response syndrome (SIRS) of non-infectious origin without acute organ dysfunction: Secondary | ICD-10-CM | POA: Diagnosis present

## 2017-08-17 DIAGNOSIS — Z79899 Other long term (current) drug therapy: Secondary | ICD-10-CM

## 2017-08-17 DIAGNOSIS — Z888 Allergy status to other drugs, medicaments and biological substances status: Secondary | ICD-10-CM

## 2017-08-17 DIAGNOSIS — F1410 Cocaine abuse, uncomplicated: Secondary | ICD-10-CM | POA: Diagnosis present

## 2017-08-17 DIAGNOSIS — E1110 Type 2 diabetes mellitus with ketoacidosis without coma: Secondary | ICD-10-CM | POA: Diagnosis present

## 2017-08-17 DIAGNOSIS — R680 Hypothermia, not associated with low environmental temperature: Secondary | ICD-10-CM | POA: Diagnosis present

## 2017-08-17 DIAGNOSIS — I10 Essential (primary) hypertension: Secondary | ICD-10-CM | POA: Diagnosis present

## 2017-08-17 DIAGNOSIS — Z87891 Personal history of nicotine dependence: Secondary | ICD-10-CM

## 2017-08-17 DIAGNOSIS — R109 Unspecified abdominal pain: Secondary | ICD-10-CM

## 2017-08-17 DIAGNOSIS — E876 Hypokalemia: Secondary | ICD-10-CM | POA: Diagnosis not present

## 2017-08-17 DIAGNOSIS — J470 Bronchiectasis with acute lower respiratory infection: Secondary | ICD-10-CM | POA: Diagnosis present

## 2017-08-17 DIAGNOSIS — Z794 Long term (current) use of insulin: Secondary | ICD-10-CM

## 2017-08-17 DIAGNOSIS — E1011 Type 1 diabetes mellitus with ketoacidosis with coma: Principal | ICD-10-CM | POA: Diagnosis present

## 2017-08-17 DIAGNOSIS — J989 Respiratory disorder, unspecified: Secondary | ICD-10-CM

## 2017-08-17 DIAGNOSIS — E871 Hypo-osmolality and hyponatremia: Secondary | ICD-10-CM | POA: Diagnosis present

## 2017-08-17 DIAGNOSIS — E875 Hyperkalemia: Secondary | ICD-10-CM | POA: Diagnosis present

## 2017-08-17 DIAGNOSIS — N179 Acute kidney failure, unspecified: Secondary | ICD-10-CM | POA: Diagnosis present

## 2017-08-17 DIAGNOSIS — Z885 Allergy status to narcotic agent status: Secondary | ICD-10-CM

## 2017-08-17 LAB — I-STAT TROPONIN, ED: Troponin i, poc: 0.02 ng/mL (ref 0.00–0.08)

## 2017-08-17 LAB — CBG MONITORING, ED

## 2017-08-17 LAB — I-STAT CHEM 8, ED
BUN: 55 mg/dL — ABNORMAL HIGH (ref 6–20)
Calcium, Ion: 0.93 mmol/L — ABNORMAL LOW (ref 1.15–1.40)
Chloride: 77 mmol/L — ABNORMAL LOW (ref 101–111)
Creatinine, Ser: 2 mg/dL — ABNORMAL HIGH (ref 0.61–1.24)
Glucose, Bld: >700 mg/dL (ref 65–99)
HCT: 42 % (ref 39.0–52.0)
Hemoglobin: 14.3 g/dL (ref 13.0–17.0)
Potassium: 7.8 mmol/L (ref 3.5–5.1)
Sodium: 102 mmol/L — CL (ref 135–145)
TCO2: 7 mmol/L — ABNORMAL LOW (ref 22–32)

## 2017-08-17 LAB — COMPREHENSIVE METABOLIC PANEL
ALT: 12 U/L — ABNORMAL LOW (ref 17–63)
AST: 21 U/L (ref 15–41)
Albumin: 3.4 g/dL — ABNORMAL LOW (ref 3.5–5.0)
Alkaline Phosphatase: 115 U/L (ref 38–126)
BUN: 54 mg/dL — ABNORMAL HIGH (ref 6–20)
CO2: <7 mmol/L — ABNORMAL LOW (ref 22–32)
Calcium: 7.2 mg/dL — ABNORMAL LOW (ref 8.9–10.3)
Chloride: 70 mmol/L — ABNORMAL LOW (ref 101–111)
Creatinine, Ser: 2.98 mg/dL — ABNORMAL HIGH (ref 0.61–1.24)
GFR calc Af Amer: 27 mL/min — ABNORMAL LOW (ref >60)
GFR calc non Af Amer: 23 mL/min — ABNORMAL LOW (ref >60)
Glucose, Bld: 1674 mg/dL (ref 65–99)
Potassium: >7.5 mmol/L (ref 3.5–5.1)
Sodium: 104 mmol/L — CL (ref 135–145)
Total Bilirubin: 1.9 mg/dL — ABNORMAL HIGH (ref 0.3–1.2)
Total Protein: 5.5 g/dL — ABNORMAL LOW (ref 6.5–8.1)

## 2017-08-17 LAB — CBC WITH DIFFERENTIAL/PLATELET
Basophils Absolute: 0 10*3/uL (ref 0.0–0.1)
Basophils Relative: 0 %
Eosinophils Absolute: 0 10*3/uL (ref 0.0–0.7)
Eosinophils Relative: 0 %
HCT: 45.6 % (ref 39.0–52.0)
Hemoglobin: 11.8 g/dL — ABNORMAL LOW (ref 13.0–17.0)
Lymphocytes Relative: 14 %
Lymphs Abs: 3.3 10*3/uL (ref 0.7–4.0)
MCH: 29.4 pg (ref 26.0–34.0)
MCHC: 25.9 g/dL — ABNORMAL LOW (ref 30.0–36.0)
MCV: 113.7 fL — ABNORMAL HIGH (ref 78.0–100.0)
Monocytes Absolute: 0.7 10*3/uL (ref 0.1–1.0)
Monocytes Relative: 3 %
Neutro Abs: 19.8 10*3/uL — ABNORMAL HIGH (ref 1.7–7.7)
Neutrophils Relative %: 83 %
Platelets: 307 10*3/uL (ref 150–400)
RBC: 4.01 MIL/uL — ABNORMAL LOW (ref 4.22–5.81)
RDW: 12.4 % (ref 11.5–15.5)
WBC: 23.8 10*3/uL — ABNORMAL HIGH (ref 4.0–10.5)

## 2017-08-17 LAB — RAPID URINE DRUG SCREEN, HOSP PERFORMED
AMPHETAMINES: NOT DETECTED
Barbiturates: NOT DETECTED
Benzodiazepines: NOT DETECTED
Cocaine: POSITIVE — AB
Opiates: NOT DETECTED
TETRAHYDROCANNABINOL: NOT DETECTED

## 2017-08-17 LAB — I-STAT VENOUS BLOOD GAS, ED
Acid-base deficit: 29 mmol/L — ABNORMAL HIGH (ref 0.0–2.0)
Bicarbonate: 4.8 mmol/L — ABNORMAL LOW (ref 20.0–28.0)
O2 Saturation: 52 %
Patient temperature: 96
TCO2: 6 mmol/L — ABNORMAL LOW (ref 22–32)
pCO2, Ven: 25.8 mmHg — ABNORMAL LOW (ref 44.0–60.0)
pH, Ven: 6.864 — CL (ref 7.250–7.430)
pO2, Ven: 43 mmHg (ref 32.0–45.0)

## 2017-08-17 LAB — I-STAT CG4 LACTIC ACID, ED: Lactic Acid, Venous: 5.75 mmol/L (ref 0.5–1.9)

## 2017-08-17 LAB — ETHANOL: Alcohol, Ethyl (B): <10 mg/dL (ref <10)

## 2017-08-17 MED ORDER — SODIUM CHLORIDE 0.9 % IV BOLUS (SEPSIS)
1000.0000 mL | Freq: Once | INTRAVENOUS | Status: AC
  Administered 2017-08-17: 1000 mL via INTRAVENOUS

## 2017-08-17 MED ORDER — HEPARIN SODIUM (PORCINE) 5000 UNIT/ML IJ SOLN
5000.0000 [IU] | Freq: Three times a day (TID) | INTRAMUSCULAR | Status: DC
  Administered 2017-08-18 – 2017-08-21 (×11): 5000 [IU] via SUBCUTANEOUS
  Filled 2017-08-17 (×8): qty 1

## 2017-08-17 MED ORDER — INSULIN ASPART 100 UNIT/ML ~~LOC~~ SOLN
SUBCUTANEOUS | Status: AC
  Filled 2017-08-17: qty 1

## 2017-08-17 MED ORDER — SODIUM CHLORIDE 0.9 % IV SOLN
INTRAVENOUS | Status: DC
  Administered 2017-08-17: 5.4 [IU]/h via INTRAVENOUS
  Filled 2017-08-17 (×2): qty 1

## 2017-08-17 MED ORDER — INSULIN ASPART 100 UNIT/ML ~~LOC~~ SOLN
10.0000 [IU] | Freq: Once | SUBCUTANEOUS | Status: AC
  Administered 2017-08-17: 10 [IU] via INTRAVENOUS

## 2017-08-17 MED ORDER — SODIUM CHLORIDE 0.9 % IV SOLN
INTRAVENOUS | Status: DC
Start: 2017-08-17 — End: 2017-08-21
  Administered 2017-08-18 – 2017-08-21 (×5): via INTRAVENOUS

## 2017-08-17 MED ORDER — SODIUM BICARBONATE 8.4 % IV SOLN
50.0000 meq | Freq: Once | INTRAVENOUS | Status: AC
Start: 2017-08-17 — End: 2017-08-17
  Administered 2017-08-17: 50 meq via INTRAVENOUS
  Filled 2017-08-17: qty 50

## 2017-08-17 MED ORDER — ALBUTEROL SULFATE (2.5 MG/3ML) 0.083% IN NEBU
10.0000 mg | INHALATION_SOLUTION | Freq: Once | RESPIRATORY_TRACT | Status: AC
  Administered 2017-08-17: 10 mg via RESPIRATORY_TRACT

## 2017-08-17 MED ORDER — STERILE WATER FOR INJECTION IV SOLN
INTRAVENOUS | Status: DC
  Filled 2017-08-17: qty 850

## 2017-08-17 MED ORDER — SODIUM BICARBONATE 8.4 % IV SOLN
Freq: Once | INTRAVENOUS | Status: AC
  Administered 2017-08-17: 23:00:00 via INTRAVENOUS
  Filled 2017-08-17: qty 1000

## 2017-08-17 MED ORDER — DEXTROSE-NACL 5-0.45 % IV SOLN: INTRAVENOUS | Status: DC

## 2017-08-17 MED ORDER — SODIUM CHLORIDE 0.9 % IV SOLN
1.0000 g | Freq: Once | INTRAVENOUS | Status: AC
  Administered 2017-08-17: 1 g via INTRAVENOUS
  Filled 2017-08-17: qty 10

## 2017-08-17 MED ORDER — ALBUTEROL SULFATE (2.5 MG/3ML) 0.083% IN NEBU
INHALATION_SOLUTION | RESPIRATORY_TRACT | Status: AC
  Filled 2017-08-17: qty 12

## 2017-08-17 MED ORDER — SODIUM CHLORIDE 0.9 % IV SOLN
INTRAVENOUS | Status: DC
  Administered 2017-08-18: 10.8 [IU]/h via INTRAVENOUS
  Administered 2017-08-18: 8.8 [IU]/h via INTRAVENOUS

## 2017-08-17 MED ORDER — SODIUM CHLORIDE 0.9 % IV SOLN: INTRAVENOUS | Status: DC

## 2017-08-17 MED ORDER — SODIUM CHLORIDE 0.9 % IV SOLN
INTRAVENOUS | Status: DC
  Administered 2017-08-17: 23:00:00 via INTRAVENOUS

## 2017-08-17 MED ORDER — ONDANSETRON HCL 4 MG/2ML IJ SOLN
4.0000 mg | Freq: Four times a day (QID) | INTRAMUSCULAR | Status: DC | PRN
Start: 2017-08-17 — End: 2017-08-21
  Administered 2017-08-18 – 2017-08-19 (×2): 4 mg via INTRAVENOUS
  Filled 2017-08-17 (×2): qty 2

## 2017-08-17 MED ORDER — ONDANSETRON HCL 4 MG/2ML IJ SOLN
4.0000 mg | Freq: Once | INTRAMUSCULAR | Status: AC
Start: 2017-08-17 — End: 2017-08-17
  Administered 2017-08-17: 4 mg via INTRAVENOUS
  Filled 2017-08-17: qty 2

## 2017-08-17 MED ORDER — SODIUM BICARBONATE 8.4 % IV SOLN: 100.0000 meq | Freq: Once | INTRAVENOUS | Status: DC

## 2017-08-17 MED ORDER — SODIUM CHLORIDE 0.9 % IV BOLUS (SEPSIS): 1000.0000 mL | Freq: Once | INTRAVENOUS | Status: AC

## 2017-08-17 NOTE — ED Triage Notes (Signed)
Pt transported from home by EMS after 911 called by family for AMS/ shob Pt minimally responsive, extremities cold, IV est by EMS bolus, CBG high

## 2017-08-17 NOTE — ED Provider Notes (Signed)
[facility] EMERGENCY DEPARTMENT Provider Note   CSN: [id number] Arrival date & time: 08/17/17  2205     History   Chief Complaint Chief Complaint  Patient presents with  . Altered Mental Status    HPI Rickey Smith is a 52 y.o. male.  HPI Patient presents to the emergency department with altered mental status per EMS.  The patient is unable to give me any history as he is altered.  Family states that they found him this evening altered and not as responsive.  Patient has a history of diabetes.  EMS stated that they had a blood sugar greater than 600. Past Medical History:  Diagnosis Date  . Diabetes mellitus without complication (HCC)   . Migraine   . Neuropathy     Patient Active Problem List   Diagnosis Date Noted  . Hyperlipidemia 06/26/2017  . Uncontrolled type 2 diabetes mellitus with hyperglycemia, with long-term current use of insulin (HCC) 12/28/2016  . Cocaine use 12/28/2016  . Polysubstance abuse (HCC) 04/30/2016  . Diabetic neuropathy (HCC) 08/10/2015  . Tachycardia 08/02/2015  . GERD (gastroesophageal reflux disease) 10/26/2014  . Ex-smoker 04/26/2014    Past Surgical History:  Procedure Laterality Date  . TRANSURETHRAL RESECTION OF PROSTATE N/A 08/16/2015   Procedure: TRANSURETHRAL RESECTION DRAINAGE OF PROSTATE ABCESS;  Surgeon: Barron Alvine, MD;  Location: [facility];  Service: Urology;  Laterality: N/A;       Home Medications    Prior to Admission medications   Medication Sig Start Date End Date Taking? Authorizing Provider  acetaminophen (TYLENOL) 325 MG tablet Take 650 mg by mouth every 6 (six) hours as needed for mild pain.    [provider]  amitriptyline (ELAVIL) 75 MG tablet Take 1 tablet (75 mg total) by mouth at bedtime. 04/16/17   Vivianne Master, PA-C  famotidine (PEPCID) 20 MG tablet Take 1 tablet (20 mg total) 2 (two) times daily by mouth. 05/09/17   Arby Barrette, MD  insulin aspart protamine- aspart  (NOVOLOG MIX 70/30) (70-30) 100 UNIT/ML injection Inject 0.4 mLs (40 Units total) into the skin 2 (two) times daily with a meal. 04/16/17   Danelle Earthly, Tiffany S, PA-C  lovastatin (MEVACOR) 20 MG tablet Take 1 tablet (20 mg total) by mouth at bedtime. 06/26/17   Marcine Matar, MD  ondansetron (ZOFRAN ODT) 4 MG disintegrating tablet Take 1 tablet (4 mg total) by mouth every 4 (four) hours as needed for nausea or vomiting. 06/03/17   Rolly Salter, MD  pantoprazole (PROTONIX) 40 MG tablet Take 1 tablet (40 mg total) by mouth 2 (two) times daily before a meal. 06/03/17   Rolly Salter, MD  promethazine (PHENERGAN) 25 MG suppository Place 1 suppository (25 mg total) every 6 (six) hours as needed rectally for nausea or vomiting. 05/09/17   Arby Barrette, MD  sucralfate (CARAFATE) 1 g tablet Take 1 tablet (1 g total) by mouth 4 (four) times daily -  with meals and at bedtime. 06/03/17   Rolly Salter, MD    Family History Family History  Problem Relation Age of Onset  . Heart attack Unknown   . Prostate cancer Unknown   . Hypertension Unknown   . Hypertension Mother   . Cancer Father     Social History Social History   Tobacco Use  . Smoking status: Former Smoker    Packs/day: 0.50    Years: 20.00    Pack years: 10.00    Types: Cigarettes  Last attempt to quit: 02/2017    Years since quitting: 0.4  . Smokeless tobacco: Never Used  Substance Use Topics  . Alcohol use: No  . Drug use: Yes    Types: Cocaine, Heroin     Allergies   Codeine and Gabapentin   Review of Systems Review of Systems Level 5 caveat applies due to altered mental status and severe illness. Physical Exam Updated Vital Signs BP (!) 146/82   Pulse (!) 113   Temp (!) 94.3 F (34.6 C) (Rectal)   Resp (!) 27   Ht 6\' 1"  (1.854 m)   Wt 72.6 kg (160 lb)   SpO2 100%   BMI 21.11 kg/m   Physical Exam  Constitutional: He appears well-developed and well-nourished. He appears lethargic. No distress.    HENT:  Head: Normocephalic and atraumatic.  Mouth/Throat: Oropharynx is clear and moist.  Eyes: Pupils are equal, round, and reactive to light.  Neck: Normal range of motion. Neck supple.  Cardiovascular: Normal rate, regular rhythm and normal heart sounds. Exam reveals no gallop and no friction rub.  No murmur heard. Pulmonary/Chest: Breath sounds normal. Tachypnea noted. He has no wheezes.  Abdominal: Soft. Bowel sounds are normal. He exhibits no distension. There is no tenderness.  Neurological: He appears lethargic. He is disoriented.  Skin: Skin is warm and dry. Capillary refill takes less than 2 seconds. No rash noted. No erythema.  Psychiatric: He has a normal mood and affect. His behavior is normal.  Nursing note and vitals reviewed.    ED Treatments / Results  Labs (all labs ordered are listed, but only abnormal results are displayed) Labs Reviewed  COMPREHENSIVE METABOLIC PANEL - Abnormal; Notable for the following components:      Result Value   Sodium 104 (*)    Potassium >7.5 (*)    Chloride 70 (*)    CO2 <7 (*)    Glucose, Bld 1,674 (*)    BUN 54 (*)    Creatinine, Ser 2.98 (*)    Calcium 7.2 (*)    Total Protein 5.5 (*)    Albumin 3.4 (*)    ALT 12 (*)    Total Bilirubin 1.9 (*)    GFR calc non Af Amer 23 (*)    GFR calc Af Amer 27 (*)    All other components within normal limits  CBC WITH DIFFERENTIAL/PLATELET - Abnormal; Notable for the following components:   WBC 23.8 (*)    RBC 4.01 (*)    Hemoglobin 11.8 (*)    MCV 113.7 (*)    MCHC 25.9 (*)    All other components within normal limits  CBG MONITORING, ED - Abnormal; Notable for the following components:   Glucose-Capillary >600 (*)    All other components within normal limits  I-STAT VENOUS BLOOD GAS, ED - Abnormal; Notable for the following components:   pH, Ven 6.864 (*)    pCO2, Ven 25.8 (*)    Bicarbonate 4.8 (*)    TCO2 6 (*)    Acid-base deficit 29.0 (*)    All other components within  normal limits  I-STAT CG4 LACTIC ACID, ED - Abnormal; Notable for the following components:   Lactic Acid, Venous 5.75 (*)    All other components within normal limits  I-STAT CHEM 8, ED - Abnormal; Notable for the following components:   Sodium 102 (*)    Potassium 7.8 (*)    Chloride 77 (*)    BUN 55 (*)  Creatinine, Ser 2.00 (*)    Glucose, Bld >700 (*)    Calcium, Ion 0.93 (*)    TCO2 7 (*)    All other components within normal limits  CBG MONITORING, ED - Abnormal; Notable for the following components:   Glucose-Capillary >600 (*)    All other components within normal limits  CULTURE, BLOOD (ROUTINE X 2)  CULTURE, BLOOD (ROUTINE X 2)  RESPIRATORY PANEL BY PCR  ETHANOL  RAPID URINE DRUG SCREEN, HOSP PERFORMED  BETA-HYDROXYBUTYRIC ACID  PROCALCITONIN  PROCALCITONIN  URINALYSIS, ROUTINE W REFLEX MICROSCOPIC  LACTIC ACID, PLASMA  LACTIC ACID, PLASMA  INFLUENZA PANEL BY PCR (TYPE A & B)  I-STAT TROPONIN, ED    EKG  EKG Interpretation  Date/Time:  Sunday August 17 2017 23:02:22 EST Ventricular Rate:  115 PR Interval:    QRS Duration: 134 QT Interval:  349 QTC Calculation: 483 R Axis:   34 Text Interpretation:  Sinus tachycardia Nonspecific intraventricular conduction delay Borderline ST depression, lateral leads ST elevation, consider inferior injury qrs narrowing Otherwise no significant change st changes likely rate related Otherwise no significant change Confirmed by Melene Plan [phone]) on 08/17/2017 11:14:05 PM       Radiology No results found.  Procedures Procedures (including critical care time)  Medications Ordered in ED Medications  dextrose 5 %-0.45 % sodium chloride infusion (not administered)  insulin regular (NOVOLIN R,HUMULIN R) 100 Units in sodium chloride 0.9 % 100 mL (1 Units/mL) infusion (5.4 Units/hr Intravenous New Bag/Given 08/17/17 2256)  sodium chloride 0.9 % bolus 1,000 mL (0 mLs Intravenous Stopped 08/17/17 2255)    And  sodium  chloride 0.9 % bolus 1,000 mL (1,000 mLs Intravenous New Bag/Given 08/17/17 2246)    And  0.9 %  sodium chloride infusion ( Intravenous New Bag/Given 08/17/17 2256)  insulin aspart (novoLOG) 100 UNIT/ML injection (not administered)  albuterol (PROVENTIL) (2.5 MG/3ML) 0.083% nebulizer solution (not administered)  dextrose 5 % 1,000 mL with sodium bicarbonate 150 mEq infusion (not administered)  sodium bicarbonate injection 50 mEq (not administered)  sodium bicarbonate 150 mEq in sterile water 1,000 mL infusion (not administered)  calcium gluconate 1 g in sodium chloride 0.9 % 100 mL IVPB (0 g Intravenous Stopped 08/17/17 2303)  sodium chloride 0.9 % bolus 1,000 mL (0 mLs Intravenous Stopped 08/17/17 2249)  sodium chloride 0.9 % bolus 1,000 mL (0 mLs Intravenous Stopped 08/17/17 2257)  albuterol (PROVENTIL) (2.5 MG/3ML) 0.083% nebulizer solution 10 mg (10 mg Nebulization Given 08/17/17 2232)  insulin aspart (novoLOG) injection 10 Units (10 Units Intravenous Given 08/17/17 2230)  ondansetron (ZOFRAN) injection 4 mg (4 mg Intravenous Given 08/17/17 2310)     Initial Impression / Assessment and Plan / ED Course  I have reviewed the triage vital signs and the nursing notes.  Pertinent labs & imaging results that were available during my care of the patient were reviewed by me and considered in my medical decision making (see chart for details).     CRITICAL CARE Performed by: Jamesetta Orleans Tarvis Blossom Total critical care time:50 minutes Critical care time was exclusive of separately billable procedures and treating other patients. Critical care was necessary to treat or prevent imminent or life-threatening deterioration. Critical care was time spent personally by me on the following activities: development of treatment plan with patient and/or surrogate as well as nursing, discussions with consultants, evaluation of patient's response to treatment, examination of patient, obtaining history from patient or  surrogate, ordering and performing treatments and interventions, ordering and review  of laboratory studies, ordering and review of radiographic studies, pulse oximetry and re-evaluation of patient's condition.  Patient has significant DKA his blood sugars were in the 1600s his pH was 6.8 critical care who will evaluate the patient and admit him to the ICU.  The patient's potassium was also 7.8 treated with albuterol insulin and calcium.  The patient was also started on a bicarb drip.  Final Clinical Impressions(s) / ED Diagnoses   Final diagnoses:  Diabetic ketoacidosis with coma associated with type 1 diabetes mellitus [facility])    ED Discharge Orders    None       Charlestine Night, PA-C 08/18/17 0047    Melene Plan, DO 08/18/17 1017

## 2017-08-17 NOTE — ED Notes (Signed)
I Stat VBG results [facility] 6.848, repeated back.

## 2017-08-17 NOTE — ED Notes (Signed)
Bair hugger applied d/t rectal temp 94.3

## 2017-08-17 NOTE — ED Notes (Signed)
I Stat Lactic Acid results 5.75, I stat Chem  Na102, K 7.8, Glu <700 shown to Dr. Floyd, and [facility] [place] lawyer PA.

## 2017-08-17 NOTE — H&P (Addendum)
PULMONARY / CRITICAL CARE MEDICINE   Name: Rickey HooseWilliam S Stiehl MRN: [id number] DOB: 07/08/1965    ADMISSION DATE:  08/17/2017 CONSULTATION DATE:  08/17/2017   REFERRING MD:  Dr. Adela LankFloyd   CHIEF COMPLAINT:  DKA   HISTORY OF PRESENT ILLNESS:   52 year old male with Type one DM and polysubstance abuse   Presents to ED on 2/25 with AMS. Upon arrival patient severely encephalopathic. Glucose 1674, pH 6.8. Given 3L NS and started on insulin gtt. PCCM asked to admit.   After patient was able to wake up and reported to staff that he has had Flu like symptoms since Wednesday and then starting Friday had ABD pain with nausea/vomiting. Stated that he does cocaine weekly and last use was Friday.   PAST MEDICAL HISTORY :  He  has a past medical history of Diabetes mellitus without complication (HCC), Migraine, and Neuropathy.  PAST SURGICAL HISTORY: He  has a past surgical history that includes Transurethral resection of prostate (N/A, 08/16/2015).  Allergies  Allergen Reactions  . Codeine Itching  . Gabapentin Diarrhea and Nausea And Vomiting    No current facility-administered medications on file prior to encounter.    Current Outpatient Medications on File Prior to Encounter  Medication Sig  . acetaminophen (TYLENOL) 325 MG tablet Take 650 mg by mouth every 6 (six) hours as needed for mild pain.  Marland Kitchen. amitriptyline (ELAVIL) 75 MG tablet Take 1 tablet (75 mg total) by mouth at bedtime.  . famotidine (PEPCID) 20 MG tablet Take 1 tablet (20 mg total) 2 (two) times daily by mouth.  . insulin aspart protamine- aspart (NOVOLOG MIX 70/30) (70-30) 100 UNIT/ML injection Inject 0.4 mLs (40 Units total) into the skin 2 (two) times daily with a meal.  . lovastatin (MEVACOR) 20 MG tablet Take 1 tablet (20 mg total) by mouth at bedtime.  . ondansetron (ZOFRAN ODT) 4 MG disintegrating tablet Take 1 tablet (4 mg total) by mouth every 4 (four) hours as needed for nausea or vomiting.  . pantoprazole (PROTONIX) 40  MG tablet Take 1 tablet (40 mg total) by mouth 2 (two) times daily before a meal.  . promethazine (PHENERGAN) 25 MG suppository Place 1 suppository (25 mg total) every 6 (six) hours as needed rectally for nausea or vomiting.  . sucralfate (CARAFATE) 1 g tablet Take 1 tablet (1 g total) by mouth 4 (four) times daily -  with meals and at bedtime.    FAMILY HISTORY:  His indicated that the status of his mother is unknown. He indicated that the status of his father is unknown. He indicated that the status of his unknown relative is unknown.   SOCIAL HISTORY: He  reports that he quit smoking about 5 months ago. His smoking use included cigarettes. He has a 10.00 pack-year smoking history. he has never used smokeless tobacco. He reports that he uses drugs. Drugs: Cocaine and Heroin. He reports that he does not drink alcohol.  REVIEW OF SYSTEMS:   All negative; except for those that are bolded, which indicate positives.  Constitutional: weight loss, weight gain, night sweats, fevers, chills, fatigue, weakness.  HEENT: headaches, sore throat, sneezing, nasal congestion, post nasal drip, difficulty swallowing, tooth/dental problems, visual complaints, visual changes, ear aches. Neuro: difficulty with speech, weakness, numbness, ataxia. CV:  chest pain, orthopnea, PND, swelling in lower extremities, dizziness, palpitations, syncope.  Resp: cough, hemoptysis, dyspnea, wheezing. GI: heartburn, indigestion, abdominal pain, nausea, vomiting, diarrhea, constipation, change in bowel habits, loss of appetite, hematemesis, melena,  hematochezia.  GU: dysuria, change in color of urine, urgency or frequency, flank pain, hematuria. MSK: joint pain or swelling, decreased range of motion. Psych: change in mood or affect, depression, anxiety, suicidal ideations, homicidal ideations. Skin: rash, itching, bruising.  SUBJECTIVE:   VITAL SIGNS: BP 137/76   Pulse (!) 114   Temp (!) 94.3 F (34.6 C) (Rectal)    Resp (!) 31   Ht 6\' 1"  (1.854 m)   Wt 72.6 kg (160 lb)   SpO2 99%   BMI 21.11 kg/m   HEMODYNAMICS:    VENTILATOR SETTINGS:    INTAKE / OUTPUT: No intake/output data recorded.  PHYSICAL EXAMINATION: General:  Adult male, no distress  Neuro: Lethargic, Follow commands, pupils intact  HEENT:  Dry MM  Cardiovascular:  Tachy, no MRG  Lungs:  Clear breath sounds, no wheeze/crackles  Abdomen:  Tender RUQ, non-distended, active bowel souunds Musculoskeletal:  -edema  Skin:  Cool, intact   LABS:  BMET Recent Labs  Lab 08/17/17 2211 08/17/17 2225  NA 104* 102*  K >7.5* 7.8*  CL 70* 77*  CO2 <7*  --   BUN 54* 55*  CREATININE 2.98* 2.00*  GLUCOSE 1,674* >700*    Electrolytes Recent Labs  Lab 08/17/17 2211  CALCIUM 7.2*    CBC Recent Labs  Lab 08/17/17 2211 08/17/17 2225  WBC 23.8*  --   HGB 11.8* 14.3  HCT 45.6 42.0  PLT 307  --     Coag's No results for input(s): APTT, INR in the last 168 hours.  Sepsis Markers Recent Labs  Lab 08/17/17 2224  LATICACIDVEN 5.75*    ABG No results for input(s): PHART, PCO2ART, PO2ART in the last 168 hours.  Liver Enzymes Recent Labs  Lab 08/17/17 2211  AST 21  ALT 12*  ALKPHOS 115  BILITOT 1.9*  ALBUMIN 3.4*    Cardiac Enzymes No results for input(s): TROPONINI, PROBNP in the last 168 hours.  Glucose Recent Labs  Lab 08/17/17 2213 08/17/17 2245  GLUCAP >600* >600*    Imaging No results found.   STUDIES:  CXR 2/24 >> KUB 2/24 >>   CULTURES: Blood 2/24 >> U/A 2/24 >>   ANTIBIOTICS: None.   SIGNIFICANT EVENTS: 2/24 > Presents to ED   LINES/TUBES: PIV   DISCUSSION: 52 year old male with substance abuse and DM presents to ED lethargic with glucose of 1674 and PH 6.8, reports recent GI and flu like symptoms.   ASSESSMENT / PLAN:  PULMONARY A: Dyspnea, Tachypnea > r/o flu  P:   Maintain Oxygen Saturation >92 (currently on RA)  Trend CXR  RVP pending   CARDIOVASCULAR A:  HTN   P:  Cardiac Monitoring  Hold elavil and lovastatin until able to take POs  CK pending  Hydralazine PRN for systolic > 180   RENAL A:   Severe Anion-Gap Metabolic Acidosis in setting of DKA Acute Kidney Injury (Crt 2.98, baseline 0.8-0.9)  Pseudo Hyperkalemia  K 7.8 >>  P:   Trend BMP q4h Replace electrolytes as indicated  Bicarb gtt > will d/c when pH is >7 (changed from dextrose to saline)  Has received 3L NS, Given additional bolus now   Repeat VBG at 0200   GASTROINTESTINAL A:   ABD pain  Nausea  P:   NPO Zofran PRN  KUB pending  RUQ [place] ordered due to RUQ pain and N/V  Lipase pending   HEMATOLOGIC A:   DVT prophylaxis  P:  Trend CBC  Heparin SQ  INFECTIOUS A:   Hypothermia  Presented 94.3   P:   Trend WBC and Fever Curve Trend PCT and LA  RVP pending  Follow Culture Data Hold off on antibiotics  Bair Hugger in place    ENDOCRINE A:   DKA H/O DM   P:   DKA Protocol  Trend glucose q1h   NEUROLOGIC A:   Metabolic Encephalopathy > Improving   Substance Abuse UDS +Cocaine  P:   Monitor    FAMILY  - Updates: Patient updated on plan of care.   - Inter-disciplinary family meet or Palliative Care meeting due by: 08/24/2017     [name], AGACNP-BC [facility]  Pgr: [phone]  PCCM Pgr: [phone]

## 2017-08-17 NOTE — ED Notes (Signed)
Repeat EKG done post Calcium, given to Dr. Adela LankFloyd, pt heaving, antiemetic given.

## 2017-08-18 ENCOUNTER — Inpatient Hospital Stay (HOSPITAL_COMMUNITY): Payer: Self-pay

## 2017-08-18 DIAGNOSIS — N179 Acute kidney failure, unspecified: Secondary | ICD-10-CM

## 2017-08-18 DIAGNOSIS — R1013 Epigastric pain: Secondary | ICD-10-CM

## 2017-08-18 DIAGNOSIS — E1011 Type 1 diabetes mellitus with ketoacidosis with coma: Principal | ICD-10-CM

## 2017-08-18 DIAGNOSIS — E875 Hyperkalemia: Secondary | ICD-10-CM

## 2017-08-18 DIAGNOSIS — R112 Nausea with vomiting, unspecified: Secondary | ICD-10-CM

## 2017-08-18 LAB — BASIC METABOLIC PANEL
ANION GAP: 9 (ref 5–15)
Anion gap: 25 — ABNORMAL HIGH (ref 5–15)
Anion gap: 15 (ref 5–15)
BUN: 52 mg/dL — AB (ref 6–20)
BUN: 46 mg/dL — AB (ref 6–20)
BUN: 33 mg/dL — ABNORMAL HIGH (ref 6–20)
CALCIUM: 7.8 mg/dL — AB (ref 8.9–10.3)
CHLORIDE: 87 mmol/L — AB (ref 101–111)
CHLORIDE: 99 mmol/L — AB (ref 101–111)
CO2: 9 mmol/L — ABNORMAL LOW (ref 22–32)
CO2: 18 mmol/L — AB (ref 22–32)
CO2: 22 mmol/L (ref 22–32)
CREATININE: 2.44 mg/dL — AB (ref 0.61–1.24)
CREATININE: 1.61 mg/dL — AB (ref 0.61–1.24)
Calcium: 7.3 mg/dL — ABNORMAL LOW (ref 8.9–10.3)
Calcium: 8 mg/dL — ABNORMAL LOW (ref 8.9–10.3)
Chloride: 106 mmol/L (ref 101–111)
Creatinine, Ser: 1.05 mg/dL (ref 0.61–1.24)
GFR calc Af Amer: 34 mL/min — ABNORMAL LOW (ref >60)
GFR calc Af Amer: 56 mL/min — ABNORMAL LOW (ref >60)
GFR calc Af Amer: >60 mL/min (ref >60)
GFR calc non Af Amer: 29 mL/min — ABNORMAL LOW (ref >60)
GFR calc non Af Amer: 48 mL/min — ABNORMAL LOW (ref >60)
GLUCOSE: 1097 mg/dL — AB (ref 65–99)
GLUCOSE: 172 mg/dL — AB (ref 65–99)
Glucose, Bld: 435 mg/dL — ABNORMAL HIGH (ref 65–99)
POTASSIUM: 5.7 mmol/L — AB (ref 3.5–5.1)
POTASSIUM: 3.7 mmol/L (ref 3.5–5.1)
Potassium: 3.6 mmol/L (ref 3.5–5.1)
SODIUM: 121 mmol/L — AB (ref 135–145)
SODIUM: 132 mmol/L — AB (ref 135–145)
Sodium: 137 mmol/L (ref 135–145)

## 2017-08-18 LAB — BLOOD GAS, ARTERIAL
ACID-BASE DEFICIT: 9.5 mmol/L — AB (ref 0.0–2.0)
Acid-base deficit: 22.1 mmol/L — ABNORMAL HIGH (ref 0.0–2.0)
Bicarbonate: 5.2 mmol/L — ABNORMAL LOW (ref 20.0–28.0)
Bicarbonate: 14.4 mmol/L — ABNORMAL LOW (ref 20.0–28.0)
DRAWN BY: 41977
DRAWN BY: 41977
FIO2: 21
FIO2: 21
O2 SAT: 96.8 %
O2 Saturation: 96.6 %
PCO2 ART: 24 mmHg — AB (ref 32.0–48.0)
PH ART: 7.17 — AB (ref 7.350–7.450)
PH ART: 7.395 (ref 7.350–7.450)
Patient temperature: 98.6
Patient temperature: 98.6
pO2, Arterial: 108 mmHg (ref 83.0–108.0)
pO2, Arterial: 88.1 mmHg (ref 83.0–108.0)

## 2017-08-18 LAB — GLUCOSE, CAPILLARY
GLUCOSE-CAPILLARY: 344 mg/dL — AB (ref 65–99)
GLUCOSE-CAPILLARY: 266 mg/dL — AB (ref 65–99)
GLUCOSE-CAPILLARY: 219 mg/dL — AB (ref 65–99)
Glucose-Capillary: >600 mg/dL (ref 65–99)
Glucose-Capillary: 500 mg/dL — ABNORMAL HIGH (ref 65–99)
Glucose-Capillary: 462 mg/dL — ABNORMAL HIGH (ref 65–99)
Glucose-Capillary: 373 mg/dL — ABNORMAL HIGH (ref 65–99)
Glucose-Capillary: 231 mg/dL — ABNORMAL HIGH (ref 65–99)
Glucose-Capillary: 199 mg/dL — ABNORMAL HIGH (ref 65–99)
Glucose-Capillary: 144 mg/dL — ABNORMAL HIGH (ref 65–99)
Glucose-Capillary: 300 mg/dL — ABNORMAL HIGH (ref 65–99)
Glucose-Capillary: 268 mg/dL — ABNORMAL HIGH (ref 65–99)
Glucose-Capillary: 218 mg/dL — ABNORMAL HIGH (ref 65–99)

## 2017-08-18 LAB — URINALYSIS, ROUTINE W REFLEX MICROSCOPIC
Bacteria, UA: NONE SEEN
Bilirubin Urine: NEGATIVE
Glucose, UA: >=500 mg/dL — AB
Ketones, ur: 20 mg/dL — AB
Leukocytes, UA: NEGATIVE
Nitrite: NEGATIVE
PH: 5 (ref 5.0–8.0)
Protein, ur: NEGATIVE mg/dL
RBC / HPF: NONE SEEN RBC/hpf (ref 0–5)
SPECIFIC GRAVITY, URINE: 1.021 (ref 1.005–1.030)
Squamous Epithelial / LPF: NONE SEEN

## 2017-08-18 LAB — RESPIRATORY PANEL BY PCR
ADENOVIRUS-RVPPCR: NOT DETECTED
Bordetella pertussis: NOT DETECTED
CORONAVIRUS 229E-RVPPCR: NOT DETECTED
CORONAVIRUS HKU1-RVPPCR: NOT DETECTED
CORONAVIRUS NL63-RVPPCR: NOT DETECTED
CORONAVIRUS OC43-RVPPCR: NOT DETECTED
Chlamydophila pneumoniae: NOT DETECTED
INFLUENZA A-RVPPCR: NOT DETECTED
Influenza B: NOT DETECTED
MYCOPLASMA PNEUMONIAE-RVPPCR: NOT DETECTED
Metapneumovirus: NOT DETECTED
PARAINFLUENZA VIRUS 1-RVPPCR: NOT DETECTED
Parainfluenza Virus 2: NOT DETECTED
Parainfluenza Virus 3: NOT DETECTED
Parainfluenza Virus 4: NOT DETECTED
Respiratory Syncytial Virus: NOT DETECTED
Rhinovirus / Enterovirus: NOT DETECTED

## 2017-08-18 LAB — COMPREHENSIVE METABOLIC PANEL
ALBUMIN: 3.3 g/dL — AB (ref 3.5–5.0)
ALT: 17 U/L (ref 17–63)
AST: 32 U/L (ref 15–41)
Alkaline Phosphatase: 131 U/L — ABNORMAL HIGH (ref 38–126)
BUN: 50 mg/dL — AB (ref 6–20)
CHLORIDE: 83 mmol/L — AB (ref 101–111)
CO2: <7 mmol/L — ABNORMAL LOW (ref 22–32)
CREATININE: 2.65 mg/dL — AB (ref 0.61–1.24)
Calcium: 6.9 mg/dL — ABNORMAL LOW (ref 8.9–10.3)
GFR calc Af Amer: 31 mL/min — ABNORMAL LOW (ref >60)
GFR, EST NON AFRICAN AMERICAN: 26 mL/min — AB (ref >60)
Glucose, Bld: 1281 mg/dL (ref 65–99)
POTASSIUM: 7.1 mmol/L — AB (ref 3.5–5.1)
Sodium: 112 mmol/L — CL (ref 135–145)
Total Bilirubin: 1.9 mg/dL — ABNORMAL HIGH (ref 0.3–1.2)
Total Protein: 5.7 g/dL — ABNORMAL LOW (ref 6.5–8.1)

## 2017-08-18 LAB — PROCALCITONIN
PROCALCITONIN: 0.49 ng/mL
PROCALCITONIN: 1.35 ng/mL

## 2017-08-18 LAB — BLOOD CULTURE ID PANEL (REFLEXED)

## 2017-08-18 LAB — INFLUENZA PANEL BY PCR (TYPE A & B)
Influenza A By PCR: NEGATIVE
Influenza B By PCR: NEGATIVE

## 2017-08-18 LAB — CBC WITH DIFFERENTIAL/PLATELET
Basophils Absolute: 0 10*3/uL (ref 0.0–0.1)
Basophils Relative: 0 %
EOS PCT: 0 %
Eosinophils Absolute: 0 10*3/uL (ref 0.0–0.7)
HEMATOCRIT: 29.5 % — AB (ref 39.0–52.0)
Hemoglobin: 10.5 g/dL — ABNORMAL LOW (ref 13.0–17.0)
LYMPHS ABS: 1.8 10*3/uL (ref 0.7–4.0)
Lymphocytes Relative: 11 %
MCH: 28.7 pg (ref 26.0–34.0)
MCHC: 35.6 g/dL (ref 30.0–36.0)
MCV: 80.6 fL (ref 78.0–100.0)
MONO ABS: 0.8 10*3/uL (ref 0.1–1.0)
Monocytes Relative: 5 %
NEUTROS ABS: 13.7 10*3/uL — AB (ref 1.7–7.7)
Neutrophils Relative %: 84 %
Platelets: 184 10*3/uL (ref 150–400)
RBC: 3.66 MIL/uL — AB (ref 4.22–5.81)
RDW: 12.4 % (ref 11.5–15.5)
WBC: 16.3 10*3/uL — AB (ref 4.0–10.5)

## 2017-08-18 LAB — LIPASE, BLOOD: LIPASE: 64 U/L — AB (ref 11–51)

## 2017-08-18 LAB — LACTIC ACID, PLASMA
LACTIC ACID, VENOUS: 1.9 mmol/L (ref 0.5–1.9)
Lactic Acid, Venous: 2.7 mmol/L (ref 0.5–1.9)
Lactic Acid, Venous: 1.8 mmol/L (ref 0.5–1.9)

## 2017-08-18 LAB — MAGNESIUM: Magnesium: 2 mg/dL (ref 1.7–2.4)

## 2017-08-18 LAB — CK: CK TOTAL: 135 U/L (ref 49–397)

## 2017-08-18 LAB — CBG MONITORING, ED

## 2017-08-18 LAB — PHOSPHORUS: PHOSPHORUS: 1.3 mg/dL — AB (ref 2.5–4.6)

## 2017-08-18 LAB — BETA-HYDROXYBUTYRIC ACID: Beta-Hydroxybutyric Acid: >8 mmol/L — ABNORMAL HIGH (ref 0.05–0.27)

## 2017-08-18 LAB — MRSA PCR SCREENING: MRSA BY PCR: NEGATIVE

## 2017-08-18 MED ORDER — SODIUM BICARBONATE 8.4 % IV SOLN
50.0000 meq | Freq: Once | INTRAVENOUS | Status: AC
  Administered 2017-08-18: 50 meq via INTRAVENOUS
  Filled 2017-08-18: qty 50

## 2017-08-18 MED ORDER — ACETAMINOPHEN 325 MG PO TABS
650.0000 mg | ORAL_TABLET | ORAL | Status: DC | PRN
  Administered 2017-08-18 – 2017-08-20 (×3): 650 mg via ORAL
  Filled 2017-08-18 (×3): qty 2

## 2017-08-18 MED ORDER — ORAL CARE MOUTH RINSE
15.0000 mL | Freq: Two times a day (BID) | OROMUCOSAL | Status: DC
  Administered 2017-08-18 – 2017-08-21 (×4): 15 mL via OROMUCOSAL

## 2017-08-18 MED ORDER — GLUCERNA SHAKE PO LIQD
237.0000 mL | Freq: Two times a day (BID) | ORAL | Status: DC
  Administered 2017-08-19 – 2017-08-21 (×6): 237 mL via ORAL
  Filled 2017-08-18: qty 237

## 2017-08-18 MED ORDER — GLUCERNA PO LIQD
237.0000 mL | Freq: Two times a day (BID) | ORAL | Status: DC
  Filled 2017-08-18 (×2): qty 237

## 2017-08-18 MED ORDER — CALCIUM GLUCONATE 10 % IV SOLN
1.0000 g | Freq: Once | INTRAVENOUS | Status: AC
  Administered 2017-08-18: 1 g via INTRAVENOUS
  Filled 2017-08-18: qty 10

## 2017-08-18 MED ORDER — DEXTROSE 5 % IV SOLN
30.0000 mmol | Freq: Once | INTRAVENOUS | Status: AC
  Administered 2017-08-18: 30 mmol via INTRAVENOUS
  Filled 2017-08-18: qty 10

## 2017-08-18 MED ORDER — INSULIN DETEMIR 100 UNIT/ML ~~LOC~~ SOLN
20.0000 [IU] | Freq: Two times a day (BID) | SUBCUTANEOUS | Status: DC
  Administered 2017-08-18: 20 [IU] via SUBCUTANEOUS
  Filled 2017-08-18 (×2): qty 0.2

## 2017-08-18 MED ORDER — HYDRALAZINE HCL 20 MG/ML IJ SOLN: 10.0000 mg | Freq: Four times a day (QID) | INTRAMUSCULAR | Status: DC | PRN

## 2017-08-18 MED ORDER — INSULIN DETEMIR 100 UNIT/ML ~~LOC~~ SOLN
20.0000 [IU] | Freq: Two times a day (BID) | SUBCUTANEOUS | Status: DC
  Administered 2017-08-18 – 2017-08-21 (×6): 20 [IU] via SUBCUTANEOUS
  Filled 2017-08-18 (×7): qty 0.2

## 2017-08-18 MED ORDER — INSULIN ASPART 100 UNIT/ML IV SOLN
10.0000 [IU] | Freq: Once | INTRAVENOUS | Status: AC
  Administered 2017-08-18: 10 [IU] via INTRAVENOUS

## 2017-08-18 MED ORDER — CEFTRIAXONE SODIUM 2 G IJ SOLR
2.0000 g | INTRAMUSCULAR | Status: DC
  Filled 2017-08-18: qty 20

## 2017-08-18 MED ORDER — METRONIDAZOLE IN NACL 5-0.79 MG/ML-% IV SOLN: 500.0000 mg | Freq: Three times a day (TID) | INTRAVENOUS | Status: DC

## 2017-08-18 MED ORDER — INSULIN ASPART 100 UNIT/ML ~~LOC~~ SOLN
2.0000 [IU] | SUBCUTANEOUS | Status: DC
  Administered 2017-08-18: 2 [IU] via SUBCUTANEOUS
  Administered 2017-08-18: 6 [IU] via SUBCUTANEOUS

## 2017-08-18 MED ORDER — INSULIN ASPART 100 UNIT/ML ~~LOC~~ SOLN
0.0000 [IU] | SUBCUTANEOUS | Status: DC
  Administered 2017-08-18: 5 [IU] via SUBCUTANEOUS
  Administered 2017-08-19 (×3): 2 [IU] via SUBCUTANEOUS
  Administered 2017-08-19: 3 [IU] via SUBCUTANEOUS
  Administered 2017-08-19: 1 [IU] via SUBCUTANEOUS
  Administered 2017-08-19: 2 [IU] via SUBCUTANEOUS
  Administered 2017-08-20: 1 [IU] via SUBCUTANEOUS
  Administered 2017-08-20: 3 [IU] via SUBCUTANEOUS
  Administered 2017-08-20: 5 [IU] via SUBCUTANEOUS
  Administered 2017-08-21 (×2): 3 [IU] via SUBCUTANEOUS

## 2017-08-18 MED ORDER — PIPERACILLIN-TAZOBACTAM 3.375 G IVPB 30 MIN
3.3750 g | Freq: Once | INTRAVENOUS | Status: AC
  Administered 2017-08-18: 3.375 g via INTRAVENOUS
  Filled 2017-08-18: qty 50

## 2017-08-18 MED ORDER — PIPERACILLIN-TAZOBACTAM 3.375 G IVPB
3.3750 g | Freq: Three times a day (TID) | INTRAVENOUS | Status: DC
  Filled 2017-08-18: qty 50

## 2017-08-18 NOTE — Progress Notes (Signed)
eLink Physician-Brief Progress Note Patient Name: Rickey HooseWilliam S England DOB: 11/03/1965 MRN: [id number]   Date of Service  08/18/2017  HPI/Events of Note  Hyperglycemia - Already on Lantus BID. Blood glucose = 300 --> 268.  eICU Interventions  Will order: 1. Q 4 hour Sensitive Novolog SSI.     Intervention Category Major Interventions: Hyperglycemia - active titration of insulin therapy  Cami Delawder Dennard Nipugene 08/18/2017, 9:07 PM

## 2017-08-18 NOTE — Progress Notes (Signed)
PCCM Interval Note  U/S Abdominal negative. WBC 23.8, PCT 1.35 with ABD pain and Hypothermia. Cultures processing. Will place on Zosyn for Intra-Abdominal coverage.   Jovita KussmaulKatalina Semaj Kham, AGACNP-BC [facility]  Pgr: [phone]  PCCM Pgr: [phone]

## 2017-08-18 NOTE — Progress Notes (Signed)
PHARMACY - PHYSICIAN COMMUNICATION CRITICAL VALUE ALERT - BLOOD CULTURE IDENTIFICATION (BCID)  Rickey HooseWilliam S Smith is an 52 y.o. male [facility] with a chief complaint of DKA.  Assessment:  1/2 blood culture grew staph species, could be a contamination, afebrile, wbc trending down 23.8 > 16.3K  Name of physician (or Provider) Contacted: Sommer   Current antibiotics: none  Changes to prescribed antibiotics recommended: hold off antibiotics unless patient becomes febrile.   Results for orders placed or performed during the hospital encounter of 08/17/17  Blood Culture ID Panel (Reflexed) (Collected: 08/17/2017 10:17 PM)  Result Value Ref Range   Enterococcus species NOT DETECTED NOT DETECTED   Listeria monocytogenes NOT DETECTED NOT DETECTED   Staphylococcus species DETECTED (A) NOT DETECTED   Staphylococcus aureus NOT DETECTED NOT DETECTED   Methicillin resistance NOT DETECTED NOT DETECTED   Streptococcus species NOT DETECTED NOT DETECTED   Streptococcus agalactiae NOT DETECTED NOT DETECTED   Streptococcus pneumoniae NOT DETECTED NOT DETECTED   Streptococcus pyogenes NOT DETECTED NOT DETECTED   Acinetobacter baumannii NOT DETECTED NOT DETECTED   Enterobacteriaceae species NOT DETECTED NOT DETECTED   Enterobacter cloacae complex NOT DETECTED NOT DETECTED   Escherichia coli NOT DETECTED NOT DETECTED   Klebsiella oxytoca NOT DETECTED NOT DETECTED   Klebsiella pneumoniae NOT DETECTED NOT DETECTED   Proteus species NOT DETECTED NOT DETECTED   Serratia marcescens NOT DETECTED NOT DETECTED   Haemophilus influenzae NOT DETECTED NOT DETECTED   Neisseria meningitidis NOT DETECTED NOT DETECTED   Pseudomonas aeruginosa NOT DETECTED NOT DETECTED   Candida albicans NOT DETECTED NOT DETECTED   Candida glabrata NOT DETECTED NOT DETECTED   Candida krusei NOT DETECTED NOT DETECTED   Candida parapsilosis NOT DETECTED NOT DETECTED   Candida tropicalis NOT DETECTED NOT  DETECTED    Bayard HuggerMei Beverley Sherrard, PharmD, BCPS  Clinical Pharmacist  Pager: [phone]   08/18/2017  8:26 PM

## 2017-08-18 NOTE — Progress Notes (Signed)
eLink Physician-Brief Progress Note Patient Name: Rickey HooseWilliam S Kisiel DOB: 07/04/1965 MRN: [id number]   Date of Service  08/18/2017  HPI/Events of Note  Multiple issues: 1. Na+ = 112, K+ = 7.1, Glucose = 1281 and pH = 7.1.  eICU Interventions  Will order: 1. Novolog insulin 10 units IV now.  2. Calcium gluconate 1 gm IV now.  3. NaHCO3 50 meq IV now.  4. Continue to trend K+.      Intervention Category Major Interventions: Electrolyte abnormality - evaluation and management  Sommer,Steven Eugene 08/18/2017, 1:46 AM

## 2017-08-18 NOTE — Progress Notes (Signed)
PULMONARY / CRITICAL CARE MEDICINE   Name: Rickey Smith MRN: [id number] DOB: [birth year]    ADMISSION DATE:  08/17/2017 CONSULTATION DATE:  08/17/2017   REFERRING MD:  Dr. Adela Lank   CHIEF COMPLAINT:  DKA   HISTORY OF PRESENT ILLNESS:   52 year old male with Type one DM and polysubstance abuse   Presents to ED on 2/25 with AMS. Upon arrival patient severely encephalopathic. Glucose 1674, pH 6.8. Given 3L NS and started on insulin gtt. PCCM asked to admit.   After patient was able to wake up and reported to staff that he has had Flu like symptoms since Wednesday and then starting Friday had ABD pain with nausea/vomiting. Stated that he does cocaine weekly and last use was Friday.   SUBJECTIVE:   VITAL SIGNS: BP 128/70   Pulse (!) 118   Temp 97.6 F (36.4 C) (Axillary)   Resp 16   Ht 6\' 1"  (1.854 m)   Wt 169 lb 1.5 oz (76.7 kg)   SpO2 97%   BMI 22.31 kg/m   HEMODYNAMICS:    VENTILATOR SETTINGS:    INTAKE / OUTPUT: I/O last 3 completed shifts: In: 1780.6 [I.V.:1670.6; IV Piggyback:110] Out: 6575 [Urine:6575]  PHYSICAL EXAMINATION: General:  Chronically ill appearing male, NAD HEENT: DMM pink/moist Neuro: [place]/AT, PERRL, EOM-I and MMM CV: RRR, Nl S1/S2 and -M/R/G. PULM: even/non-labored, lungs bilaterally KG:MWNU, non-tender, bsx4 active  Extremities: warm/dry, neg edema  Skin: no rashes or lesions  LABS:  BMET Recent Labs  Lab 08/17/17 2328 08/18/17 0142 08/18/17 0735  NA 112* 121* 132*  K 7.1* 5.7* 3.7  CL 83* 87* 99*  CO2 <7* 9* 18*  BUN 50* 52* 46*  CREATININE 2.65* 2.44* 1.61*  GLUCOSE 1,281* 1,097* 435*    Electrolytes Recent Labs  Lab 08/17/17 2328 08/18/17 0142 08/18/17 0735  CALCIUM 6.9* 7.3* 8.0*  MG  --   --  2.0  PHOS  --   --  1.3*    CBC Recent Labs  Lab 08/17/17 2211 08/17/17 2225  WBC 23.8*  --   HGB 11.8* 14.3  HCT 45.6 42.0  PLT 307  --     Coag's No results for input(s): APTT, INR in the last 168 hours.  Sepsis  Markers Recent Labs  Lab 08/17/17 2328 08/18/17 0142 08/18/17 0436 08/18/17 0735  LATICACIDVEN  --  2.7* 1.9 1.8  PROCALCITON 0.49 1.35  --   --     ABG Recent Labs  Lab 08/18/17 0110 08/18/17 0425  PHART 7.170* 7.395  PCO2ART CRITICAL RESULT CALLED TO, READ BACK BY AND VERIFIED WITH: 24.0*  PO2ART 108 88.1    Liver Enzymes Recent Labs  Lab 08/17/17 2211 08/17/17 2328  AST 21 32  ALT 12* 17  ALKPHOS 115 131*  BILITOT 1.9* 1.9*  ALBUMIN 3.4* 3.3*    Cardiac Enzymes No results for input(s): TROPONINI, PROBNP in the last 168 hours.  Glucose Recent Labs  Lab 08/18/17 0323 08/18/17 0433 08/18/17 0539 08/18/17 0642 08/18/17 0747 08/18/17 0846  GLUCAP >600* >600* 500* 462* 373* 344*    Imaging Dg Chest Port 1 View  Result Date: 08/18/2017 CLINICAL DATA:  Vomiting, body aches, fever EXAM: PORTABLE CHEST 1 VIEW COMPARISON:  12/27/2016 FINDINGS: Mild peribronchial thickening. Heart and mediastinal contours are within normal limits. No focal opacities or effusions. No acute bony abnormality. IMPRESSION: Mild bronchitic changes Electronically Signed   By: Charlett Nose M.D.   On: 08/18/2017 00:05   Dg Abd Portable 1v  Result Date: 08/18/2017 CLINICAL DATA:  Vomiting, body aches, fever EXAM: PORTABLE ABDOMEN - 1 VIEW COMPARISON:  04/23/2016 FINDINGS: Nonspecific bowel gas pattern with relative paucity of gas throughout the abdomen and pelvis. Gas only visualized within the right colon and proximal transverse colon. No free air organomegaly. No suspicious calcification. IMPRESSION: Nonspecific, nonobstructive bowel gas pattern with relative paucity of gas throughout the abdomen and pelvis as above. Electronically Signed   By: Charlett Nose M.D.   On: 08/18/2017 00:05   US Abdomen Limited Ruq  Result Date: 08/18/2017 CLINICAL DATA:  52 year old male with abdominal pain. EXAM: ULTRASOUND ABDOMEN LIMITED RIGHT UPPER QUADRANT COMPARISON:  Abdominal CT dated 02/11/2016 FINDINGS:  Gallbladder: No gallstones or wall thickening visualized. No sonographic Murphy sign noted by sonographer. Common bile duct: Diameter: 4 mm Liver: There is a 1.1 x 1.0 x 1.2 cm hypoechoic lesion in the right lobe of the liver which is not well characterized but may represent a hemangioma. Portal vein is patent on color Doppler imaging with normal direction of blood flow towards the liver. IMPRESSION: 1. Unremarkable gallbladder. 2. Small right hepatic lobe echogenic lesion, indeterminate, possibly a hemangioma. Electronically Signed   By: Elgie Collard M.D.   On: 08/18/2017 05:23   STUDIES:  CXR 2/24 >> mild peribronchial thickening KUB 2/24 >> Nonspecific, nonobstructive bowel gas pattern with relative paucity of gas throughout the abdomen and pelvis as above. RUQ [place] 2/25 >>  1. Unremarkable gallbladder. 2. Small right hepatic lobe echogenic lesion, indeterminate, possibly a hemangioma.  CULTURES: MRSA PCR  2/24 >> ng Blood 2/24 >> U/A 2/24 >>  RVP 2/25 >> neg  ANTIBIOTICS: 2/25 azithromax >>  SIGNIFICANT EVENTS: 2/24 > Presents to ED   LINES/TUBES: PIV   DISCUSSION: 52 year old male with substance abuse and DM presents to ED lethargic with glucose of 1674 and PH 6.8, reports recent GI and flu like symptoms.  RSV neg.  ASSESSMENT / PLAN:  PULMONARY A: Acute Bronchitis  - RVP neg P:   Maintain Oxygen Saturation >92 (currently on RA)  RVP neg  CARDIOVASCULAR A:  SIRS HTN  + cocaine abuse P:  Cardiac Monitoring  Hold elavil and lovastatin  CK 135 Repeat EKG Hydralazine PRN for systolic > 180  No beta blockers  RENAL A:   Severe Anion-Gap Metabolic Acidosis in setting of DKA/ lactic acidosis Acute Kidney Injury (Crt 2.98, baseline 0.8-0.9) - improving  PseudoHyperkalemia   Pseudohyponatremia  - I/Os inaccurate  P:   AG closed kphos 30 mmol x 1 NS at 125 ml/hr , to change to D5/.45NS when glucose < 250 Trend BMP / urinary output Replace electrolytes as  indicated  GASTROINTESTINAL A:   ABD pain, RUQ Nausea  P:   NPO Zofran PRN, monitor QTc Trend LFTs KUB showing nonobstructive gas pattern RUQ [place] neg- shows right hepatic lobe lesion, echogenic- (as noted on previous abd CT 01/2016 in addition to RLL subpleural nodule, spleenic cyst, bilateral adrenal adenomas, cyst on left kidney)- will need further outpatient follow-up  Lipase 64  HEMATOLOGIC A:   DVT prophylaxis  P:  Trend CBC  Heparin SQ   INFECTIOUS A:   SIRS Acute bronchitis R/o abd process  - PCT 1.35 - RVP neg - UA neg, cxr suggestive of bronchitis  - RUQ [place] neg  P:   Trend WBC and Fever Curve Trend PCT  RVP neg Start azithro  ENDOCRINE A:   DKA - patient reportedly not taking his insulin (HA1c 11.2 on 06/01/2017)  H/O DM   P:   DKA Protocol - transition to 2nd phase Trend glucose q1h  consider CSW/ diabetes coordinator as patient appears to be poorly controlled/non-compliant patient  NEUROLOGIC A:   Metabolic Encephalopathy > Improving   Substance Abuse UDS +Cocaine  P:   Monitor   FAMILY  - Updates: Patient updated on plan of care.   - Inter-disciplinary family meet or Palliative Care meeting due by: 08/24/2017   Will transfer to tele bed.  [facility]   Posey BoyerBrooke Simpson, AGACNP-BC [facility] Pgr: [phone] or if no answer [phone] 08/18/2017, 9:56 AM  Attending Note:  52 year old male with PMH of DM and strong history of non-compliance as well as cocaine abuse who presents to the hospital with AMS and found to be in DKA with a pH of 6.8.  Patient was admitted to the ICU given lab values as he also had renal failure and hyperkalemia.  On exam, DMM with clear lungs.  I reviewed CXR myself, evidence of bronchiectasis noted.  Discussed with PCCM-NP.  Will continue with IVF resuscitation and insulin drip for now.  Lantus ordered and hope is to get off the insulin drip by the afternoon.   Diabetic educator to see prior to discharge but given non-compliance history I am unsure if he will follow recommendations.  PCT is elevated and WBC is elevated.  Will start zithromax for bronchiectatic changes.  Replace electrolytes and PCCM will f/u in AM.  The patient is critically ill with multiple organ systems failure and requires high complexity decision making for assessment and support, frequent evaluation and titration of therapies, application of advanced monitoring technologies and extensive interpretation of multiple databases.   Critical Care Time devoted to patient care services described in this note is  35  Minutes. This time reflects time of care of this signee Dr Koren BoundWesam Audie Stayer. This critical care time does not reflect procedure time, or teaching time or supervisory time of PA/NP/Med student/Med Resident etc but could involve care discussion time.  Alyson ReedyWesam G. Jozelyn Kuwahara, M.D. [facility]. Pager: [phone]. After hours pager: [phone].

## 2017-08-18 NOTE — Progress Notes (Signed)
eLink Physician-Brief Progress Note Patient Name: Rickey HooseWilliam S Smith DOB: 01/23/1966 MRN: [id number]   Date of Service  08/18/2017  HPI/Events of Note  1/2 Blood cultures growing Staph species. Temp = 98.6 F. WBC = 16.3 However, the patient was in DKA. Will hold off on starting Abx coverage at this time. However, if he spikes a fever, will need coverage with Vancomycin. Discussed with pharmacist.   eICU Interventions  Continue present management.      Intervention Category Major Interventions: Infection - evaluation and management  Sheril Hammond Eugene 08/18/2017, 9:41 PM

## 2017-08-18 NOTE — Progress Notes (Signed)
CRITICAL VALUE ALERT  Critical Value:  Glucose 1097 ; Lactic Acid 2.7  Date & Time Notied:  08/18/2017 0335  Provider Notified: Dr. Arsenio LoaderSommer  Orders Received/Actions taken: No new orders at this time.   Noe GensStefanie A Graceyn Fodor, RN

## 2017-08-19 ENCOUNTER — Inpatient Hospital Stay (HOSPITAL_COMMUNITY): Payer: Self-pay

## 2017-08-19 ENCOUNTER — Other Ambulatory Visit: Payer: Self-pay

## 2017-08-19 ENCOUNTER — Encounter (HOSPITAL_COMMUNITY): Payer: Self-pay

## 2017-08-19 DIAGNOSIS — J40 Bronchitis, not specified as acute or chronic: Secondary | ICD-10-CM

## 2017-08-19 DIAGNOSIS — E871 Hypo-osmolality and hyponatremia: Secondary | ICD-10-CM

## 2017-08-19 DIAGNOSIS — R109 Unspecified abdominal pain: Secondary | ICD-10-CM

## 2017-08-19 LAB — CBC WITH DIFFERENTIAL/PLATELET
BASOS ABS: 0 10*3/uL (ref 0.0–0.1)
BASOS PCT: 0 %
EOS ABS: 0 10*3/uL (ref 0.0–0.7)
Eosinophils Relative: 0 %
HCT: 29.7 % — ABNORMAL LOW (ref 39.0–52.0)
Hemoglobin: 10.4 g/dL — ABNORMAL LOW (ref 13.0–17.0)
LYMPHS PCT: 12 %
Lymphs Abs: 1.2 10*3/uL (ref 0.7–4.0)
MCH: 28.6 pg (ref 26.0–34.0)
MCHC: 35 g/dL (ref 30.0–36.0)
MCV: 81.6 fL (ref 78.0–100.0)
Monocytes Absolute: 0.5 10*3/uL (ref 0.1–1.0)
Monocytes Relative: 5 %
Neutro Abs: 8.3 10*3/uL — ABNORMAL HIGH (ref 1.7–7.7)
Neutrophils Relative %: 83 %
PLATELETS: 134 10*3/uL — AB (ref 150–400)
RBC: 3.64 MIL/uL — ABNORMAL LOW (ref 4.22–5.81)
RDW: 12.8 % (ref 11.5–15.5)
WBC: 10 10*3/uL (ref 4.0–10.5)

## 2017-08-19 LAB — PROCALCITONIN: PROCALCITONIN: 1.59 ng/mL

## 2017-08-19 LAB — GLUCOSE, CAPILLARY
GLUCOSE-CAPILLARY: 153 mg/dL — AB (ref 65–99)
GLUCOSE-CAPILLARY: 91 mg/dL (ref 65–99)
Glucose-Capillary: 177 mg/dL — ABNORMAL HIGH (ref 65–99)
Glucose-Capillary: 125 mg/dL — ABNORMAL HIGH (ref 65–99)
Glucose-Capillary: 191 mg/dL — ABNORMAL HIGH (ref 65–99)
Glucose-Capillary: 152 mg/dL — ABNORMAL HIGH (ref 65–99)

## 2017-08-19 LAB — BASIC METABOLIC PANEL
ANION GAP: 10 (ref 5–15)
BUN: 16 mg/dL (ref 6–20)
CO2: 23 mmol/L (ref 22–32)
Calcium: 8.2 mg/dL — ABNORMAL LOW (ref 8.9–10.3)
Chloride: 104 mmol/L (ref 101–111)
Creatinine, Ser: 0.75 mg/dL (ref 0.61–1.24)
GFR calc Af Amer: >60 mL/min (ref >60)
GLUCOSE: 168 mg/dL — AB (ref 65–99)
POTASSIUM: 3.5 mmol/L (ref 3.5–5.1)
Sodium: 137 mmol/L (ref 135–145)

## 2017-08-19 LAB — URINALYSIS, ROUTINE W REFLEX MICROSCOPIC
Bilirubin Urine: NEGATIVE
Glucose, UA: >=500 mg/dL — AB
Hgb urine dipstick: NEGATIVE
Ketones, ur: 20 mg/dL — AB
Leukocytes, UA: NEGATIVE
Nitrite: NEGATIVE
Protein, ur: NEGATIVE mg/dL
SPECIFIC GRAVITY, URINE: 1.023 (ref 1.005–1.030)
pH: 5 (ref 5.0–8.0)

## 2017-08-19 LAB — MAGNESIUM: MAGNESIUM: 2.3 mg/dL (ref 1.7–2.4)

## 2017-08-19 LAB — PHOSPHORUS: PHOSPHORUS: 2.8 mg/dL (ref 2.5–4.6)

## 2017-08-19 MED ORDER — ALUM & MAG HYDROXIDE-SIMETH 200-200-20 MG/5ML PO SUSP
15.0000 mL | ORAL | Status: DC | PRN
  Administered 2017-08-19 – 2017-08-20 (×3): 15 mL via ORAL
  Filled 2017-08-19 (×3): qty 30

## 2017-08-19 MED ORDER — DOCUSATE SODIUM 100 MG PO CAPS
100.0000 mg | ORAL_CAPSULE | Freq: Two times a day (BID) | ORAL | Status: DC
  Administered 2017-08-19 – 2017-08-21 (×4): 100 mg via ORAL
  Filled 2017-08-19 (×4): qty 1

## 2017-08-19 MED ORDER — PANTOPRAZOLE SODIUM 40 MG PO TBEC
40.0000 mg | DELAYED_RELEASE_TABLET | Freq: Every day | ORAL | Status: DC
  Administered 2017-08-19 – 2017-08-21 (×3): 40 mg via ORAL
  Filled 2017-08-19 (×3): qty 1

## 2017-08-19 MED ORDER — BISACODYL 5 MG PO TBEC
10.0000 mg | DELAYED_RELEASE_TABLET | Freq: Once | ORAL | Status: AC
Start: 2017-08-19 — End: 2017-08-19
  Administered 2017-08-19: 10 mg via ORAL
  Filled 2017-08-19: qty 2

## 2017-08-19 NOTE — Progress Notes (Signed)
PROGRESS NOTE    Rickey Smith  YQM:[id number] DOB: 1966/03/31 DOA: 08/17/2017 PCP: Marcine Matar, MD   Brief Narrative:  HPI on 08/17/2017 by Ms. [name], NP 52 year old male with Type one DM and polysubstance abuse   Presents to ED on 2/25 with AMS. Upon arrival patient severely encephalopathic. Glucose 1674, pH 6.8. Given 3L NS and started on insulin gtt. PCCM asked to admit.   After patient was able to wake up and reported to staff that he has had Flu like symptoms since Wednesday and then starting Friday had ABD pain with nausea/vomiting. Stated that he does cocaine weekly and last use was Friday.   Interim history  Patient was admitted to ICU for DKA.  TR[facility] assumed care on 08/19/2017. Assessment & Plan   Diabetic ketoacidosis with hyperglycemia/Uncontrolled diabetes -On admission, patient did have a pH of 7.1 and Anion gap was unable to be calculated. Blood sugar 1674 -Patient required admission to ICU and required insulin drip -Patient transition to Levemir and insulin sliding scale -Continue CBG monitoring -Last hemoglobin A1c 11.2 on 06/01/2017  Acute bronchitis -Currently on room air maintaining oxygen saturations in the high 90s -Respiratory viral panel negative -Started on azithromycin  Acute kidney injury  -Resolved -likely secondary to DKA and dehydration -Creatinine 1.82 on admission, currently 0.75 -Continue to monitor BMP  Abdominal pain/nausea -KUB obtained showing nonobstructive gas pattern -Right upper quadrant ultrasound shows  right hepatic lobe lesion, echogenic -CT scan back in  August 2017 showed right elbow subpleural nodule, splenic cyst, bilateral adrenal adenomas, left kidney cyst-patient will need outpatient follow-up -Have added Protonix, Maalox/Mylanta as needed -Obtain repeat UA lower abdominal pain, however UA unremarkable for infection.  Patient did have a Foley catheter in place, which may be the cause of his pain.    Hyperkalemia/hyponatremia (pseudo) -Resolved, -secondary to DKA -continue to monitor BMP  Acute metabolic encephalopathy -Appears to have resolved, suspect secondary to DKA versus substance abuse  Polysubstance abuse -UDS positive for cocaine  Coag negative Staph -1/2 blood cultures on 08/17/2017 showed SCN -Suspect contaminant  DVT Prophylaxis  SCDs  Code Status: Full  Family Communication: None at bedside  Disposition Plan: Admitted  Consultants PCCM  Procedures  Abdominal US  Antibiotics   Anti-infectives (From admission, onward)   Start     Dose/Rate Route Frequency Ordered Stop   08/19/17 1400  metroNIDAZOLE (FLAGYL) IVPB 500 mg  Status:  Discontinued     500 mg 100 mL/hr over 60 Minutes Intravenous Every 8 hours 08/18/17 1026 08/18/17 1043   08/18/17 1400  piperacillin-tazobactam (ZOSYN) IVPB 3.375 g  Status:  Discontinued     3.375 g 12.5 mL/hr over 240 Minutes Intravenous Every 8 hours 08/18/17 0558 08/18/17 1026   08/18/17 1400  cefTRIAXone (ROCEPHIN) 2 g in sodium chloride 0.9 % 100 mL IVPB  Status:  Discontinued     2 g 200 mL/hr over 30 Minutes Intravenous Every 24 hours 08/18/17 1026 08/18/17 1043   08/18/17 0630  piperacillin-tazobactam (ZOSYN) IVPB 3.375 g     3.375 g 100 mL/hr over 30 Minutes Intravenous  Once 08/18/17 0558 08/18/17 0650      Subjective:   Pasty Spillers seen and examined today.  Patient continues to complain of abdominal pain, nausea.  Denies any current chest pain, shortness of breath, constipation or diarrhea.  Does complain of pain with urination.  Objective:   Vitals:   08/19/17 1118 08/19/17 1200 08/19/17 1300 08/19/17 1400  BP:  135/82  135/84 138/84  Pulse:      Resp:  17 17 17   Temp: 98.4 F (36.9 C)     TempSrc: Oral     SpO2:  96% 98% 97%  Weight:      Height:        Intake/Output Summary (Last 24 hours) at 08/19/2017 1454 Last data filed at 08/19/2017 1400 Gross per 24 hour  Intake 2741.25 ml  Output  2105 ml  Net 636.25 ml   Filed Weights   08/17/17 2213 08/18/17 0045  Weight: 72.6 kg (160 lb) 76.7 kg (169 lb 1.5 oz)    Exam  General: Well developed, well nourished, NAD, appears stated age  HEENT: NCAT, PERRLA, EOMI, Anicteic Sclera, mucous membranes moist.   Neck: Supple  Cardiovascular: S1 S2 auscultated, no rubs, murmurs or gallops. Regular rate and rhythm.  Respiratory: Clear to auscultation bilaterally with equal chest rise  Abdomen: Soft, lower abdomen TTP, nondistended, + bowel sounds  Extremities: warm dry without cyanosis clubbing or edema  Neuro: AAOx3, nonfocal  Skin: Without rashes exudates or nodules, multiple tattoos  Psych: flat affect however appropriate   Data Reviewed: I have personally reviewed following labs and imaging studies  CBC: Recent Labs  Lab 08/17/17 2211 08/17/17 2225 08/18/17 1250 08/19/17 0511  WBC 23.8*  --  16.3* 10.0  NEUTROABS 19.8*  --  13.7* 8.3*  HGB 11.8* 14.3 10.5* 10.4*  HCT 45.6 42.0 29.5* 29.7*  MCV 113.7*  --  80.6 81.6  PLT 307  --  184 134*   Basic Metabolic Panel: Recent Labs  Lab 08/17/17 2328 08/18/17 0142 08/18/17 0735 08/18/17 1659 08/19/17 0511  NA 112* 121* 132* 137 137  K 7.1* 5.7* 3.7 3.6 3.5  CL 83* 87* 99* 106 104  CO2 <7* 9* 18* 22 23  GLUCOSE 1,281* 1,097* 435* 172* 168*  BUN 50* 52* 46* 33* 16  CREATININE 2.65* 2.44* 1.61* 1.05 0.75  CALCIUM 6.9* 7.3* 8.0* 7.8* 8.2*  MG  --   --  2.0  --  2.3  PHOS  --   --  1.3*  --  2.8   GFR: Estimated Creatinine Clearance: 119.8 mL/min (by C-G formula based on SCr of 0.75 mg/dL). Liver Function Tests: Recent Labs  Lab 08/17/17 2211 08/17/17 2328  AST 21 32  ALT 12* 17  ALKPHOS 115 131*  BILITOT 1.9* 1.9*  PROT 5.5* 5.7*  ALBUMIN 3.4* 3.3*   Recent Labs  Lab 08/18/17 0048  LIPASE 64*   No results for input(s): AMMONIA in the last 168 hours. Coagulation Profile: No results for input(s): INR, PROTIME in the last 168 hours. Cardiac  Enzymes: Recent Labs  Lab 08/17/17 2345  CKTOTAL 135   BNP (last 3 results) No results for input(s): PROBNP in the last 8760 hours. HbA1C: No results for input(s): HGBA1C in the last 72 hours. CBG: Recent Labs  Lab 08/18/17 2100 08/18/17 2328 08/19/17 0331 08/19/17 0726 08/19/17 1117  GLUCAP 268* 218* 177* 125* 191*   Lipid Profile: No results for input(s): CHOL, HDL, LDLCALC, TRIG, CHOLHDL, LDLDIRECT in the last 72 hours. Thyroid Function Tests: No results for input(s): TSH, T4TOTAL, FREET4, T3FREE, THYROIDAB in the last 72 hours. Anemia Panel: No results for input(s): VITAMINB12, FOLATE, FERRITIN, TIBC, IRON, RETICCTPCT in the last 72 hours. Urine analysis:    Component Value Date/Time   COLORURINE YELLOW 08/19/2017 0826   APPEARANCEUR CLEAR 08/19/2017 0826   LABSPEC 1.023 08/19/2017 0826   PHURINE 5.0 08/19/2017 1610  GLUCOSEU >=500 (A) 08/19/2017 0826   HGBUR NEGATIVE 08/19/2017 0826   BILIRUBINUR NEGATIVE 08/19/2017 0826   BILIRUBINUR neg 08/10/2015 1041   KETONESUR 20 (A) 08/19/2017 0826   PROTEINUR NEGATIVE 08/19/2017 0826   UROBILINOGEN 2.0 08/10/2015 1041   UROBILINOGEN 0.2 10/23/2014 0817   NITRITE NEGATIVE 08/19/2017 0826   LEUKOCYTESUR NEGATIVE 08/19/2017 0826   Sepsis Labs: @LABRCNTIP (procalcitonin:4,lacticidven:4)  ) Recent Results (from the past 240 hour(s))  Culture, blood (routine x 2)     Status: Abnormal (Preliminary result)   Collection Time: 08/17/17 10:17 PM  Result Value Ref Range Status   Specimen Description BLOOD  Final   Special Requests   Final    RIGHT EJ BOTTLES DRAWN AEROBIC ONLY Blood Culture adequate volume   Culture  Setup Time   Final    GRAM POSITIVE COCCI ANAEROBIC BOTTLE ONLY CRITICAL RESULT CALLED TO, READ BACK BY AND VERIFIED WITH: Sophronia SimasM BELL PHARMD 2019 08/18/17 A BROWNING    Culture (A)  Final    STAPHYLOCOCCUS SPECIES (COAGULASE NEGATIVE) THE SIGNIFICANCE OF ISOLATING THIS ORGANISM FROM A SINGLE SET OF BLOOD CULTURES  WHEN MULTIPLE SETS ARE DRAWN IS UNCERTAIN. PLEASE NOTIFY THE MICROBIOLOGY DEPARTMENT WITHIN ONE WEEK IF SPECIATION AND SENSITIVITIES ARE REQUIRED. Performed at [facility], [address] [place], [place] [address]    Report Status PENDING  Incomplete  Blood Culture ID Panel (Reflexed)     Status: Abnormal   Collection Time: 08/17/17 10:17 PM  Result Value Ref Range Status   Enterococcus species NOT DETECTED NOT DETECTED Final   Listeria monocytogenes NOT DETECTED NOT DETECTED Final   Staphylococcus species DETECTED (A) NOT DETECTED Final    Comment: Methicillin (oxacillin) susceptible coagulase negative staphylococcus. Possible blood culture contaminant (unless isolated from more than one blood culture draw or clinical case suggests pathogenicity). No antibiotic treatment is indicated for blood  culture contaminants. CRITICAL RESULT CALLED TO, READ BACK BY AND VERIFIED WITH: Sophronia SimasM BELL PHARMD 2019 08/18/17 A BROWNING    Staphylococcus aureus NOT DETECTED NOT DETECTED Final   Methicillin resistance NOT DETECTED NOT DETECTED Final   Streptococcus species NOT DETECTED NOT DETECTED Final   Streptococcus agalactiae NOT DETECTED NOT DETECTED Final   Streptococcus pneumoniae NOT DETECTED NOT DETECTED Final   Streptococcus pyogenes NOT DETECTED NOT DETECTED Final   Acinetobacter baumannii NOT DETECTED NOT DETECTED Final   Enterobacteriaceae species NOT DETECTED NOT DETECTED Final   Enterobacter cloacae complex NOT DETECTED NOT DETECTED Final   Escherichia coli NOT DETECTED NOT DETECTED Final   Klebsiella oxytoca NOT DETECTED NOT DETECTED Final   Klebsiella pneumoniae NOT DETECTED NOT DETECTED Final   Proteus species NOT DETECTED NOT DETECTED Final   Serratia marcescens NOT DETECTED NOT DETECTED Final   Haemophilus influenzae NOT DETECTED NOT DETECTED Final   Neisseria meningitidis NOT DETECTED NOT DETECTED Final   Pseudomonas aeruginosa NOT DETECTED NOT DETECTED Final   Candida albicans  NOT DETECTED NOT DETECTED Final   Candida glabrata NOT DETECTED NOT DETECTED Final   Candida krusei NOT DETECTED NOT DETECTED Final   Candida parapsilosis NOT DETECTED NOT DETECTED Final   Candida tropicalis NOT DETECTED NOT DETECTED Final    Comment: Performed at [facility], [address] [place], [place] [address]  Culture, blood (routine x 2)     Status: None (Preliminary result)   Collection Time: 08/17/17 10:35 PM  Result Value Ref Range Status   Specimen Description BLOOD LEFT FOREARM  Final   Special Requests  Final    BOTTLES DRAWN AEROBIC AND ANAEROBIC Blood Culture adequate volume   Culture   Final    NO GROWTH 2 DAYS Performed at [facility], [address] [place], [place] [address]    Report Status PENDING  Incomplete  Respiratory Panel by PCR     Status: None   Collection Time: 08/18/17 12:10 AM  Result Value Ref Range Status   Adenovirus NOT DETECTED NOT DETECTED Final   Coronavirus 229E NOT DETECTED NOT DETECTED Final   Coronavirus HKU1 NOT DETECTED NOT DETECTED Final   Coronavirus NL63 NOT DETECTED NOT DETECTED Final   Coronavirus OC43 NOT DETECTED NOT DETECTED Final   Metapneumovirus NOT DETECTED NOT DETECTED Final   Rhinovirus / Enterovirus NOT DETECTED NOT DETECTED Final   Influenza A NOT DETECTED NOT DETECTED Final   Influenza B NOT DETECTED NOT DETECTED Final   Parainfluenza Virus 1 NOT DETECTED NOT DETECTED Final   Parainfluenza Virus 2 NOT DETECTED NOT DETECTED Final   Parainfluenza Virus 3 NOT DETECTED NOT DETECTED Final   Parainfluenza Virus 4 NOT DETECTED NOT DETECTED Final   Respiratory Syncytial Virus NOT DETECTED NOT DETECTED Final   Bordetella pertussis NOT DETECTED NOT DETECTED Final   Chlamydophila pneumoniae NOT DETECTED NOT DETECTED Final   Mycoplasma pneumoniae NOT DETECTED NOT DETECTED Final    Comment: Performed at [facility], [address] [place], [place] [address]  MRSA PCR Screening     Status: None    Collection Time: 08/18/17  1:15 AM  Result Value Ref Range Status   MRSA by PCR NEGATIVE NEGATIVE Final    Comment:        The GeneXpert MRSA Assay (FDA approved for NASAL specimens only), is one component of a comprehensive MRSA colonization surveillance program. It is not intended to diagnose MRSA infection nor to guide or monitor treatment for MRSA infections. Performed at [facility], [address], [place], [place] [address]       Radiology Studies: Dg Abd 1 View  Result Date: 08/19/2017 CLINICAL DATA:  Abdominal pain for 3 days, some vomiting EXAM: ABDOMEN - 1 VIEW COMPARISON:  Abdomen films of 08/17/2017 FINDINGS: Both large and small bowel gas is present without distension. Air is seen to the rectum. No opaque calculi are noted. There are degenerative changes present in the lower lumbar spine. IMPRESSION: 1. Nonspecific bowel gas pattern.  No bowel obstruction. 2. No opaque calculi are noted. Electronically Signed   By: Dwyane Dee M.D.   On: 08/19/2017 09:05   Dg Chest Port 1 View  Result Date: 08/18/2017 CLINICAL DATA:  Vomiting, body aches, fever EXAM: PORTABLE CHEST 1 VIEW COMPARISON:  12/27/2016 FINDINGS: Mild peribronchial thickening. Heart and mediastinal contours are within normal limits. No focal opacities or effusions. No acute bony abnormality. IMPRESSION: Mild bronchitic changes Electronically Signed   By: Charlett Nose M.D.   On: 08/18/2017 00:05   Dg Abd Portable 1v  Result Date: 08/18/2017 CLINICAL DATA:  Vomiting, body aches, fever EXAM: PORTABLE ABDOMEN - 1 VIEW COMPARISON:  04/23/2016 FINDINGS: Nonspecific bowel gas pattern with relative paucity of gas throughout the abdomen and pelvis. Gas only visualized within the right colon and proximal transverse colon. No free air organomegaly. No suspicious calcification. IMPRESSION: Nonspecific, nonobstructive bowel gas pattern with relative paucity of gas throughout the abdomen and pelvis as above.  Electronically Signed   By: Charlett Nose M.D.   On: 08/18/2017 00:05   US Abdomen Limited  Ruq  Result Date: 08/18/2017 CLINICAL DATA:  52 year old male with abdominal pain. EXAM: ULTRASOUND ABDOMEN LIMITED RIGHT UPPER QUADRANT COMPARISON:  Abdominal CT dated 02/11/2016 FINDINGS: Gallbladder: No gallstones or wall thickening visualized. No sonographic Murphy sign noted by sonographer. Common bile duct: Diameter: 4 mm Liver: There is a 1.1 x 1.0 x 1.2 cm hypoechoic lesion in the right lobe of the liver which is not well characterized but may represent a hemangioma. Portal vein is patent on color Doppler imaging with normal direction of blood flow towards the liver. IMPRESSION: 1. Unremarkable gallbladder. 2. Small right hepatic lobe echogenic lesion, indeterminate, possibly a hemangioma. Electronically Signed   By: Elgie Collard M.D.   On: 08/18/2017 05:23     Scheduled Meds: . feeding supplement (GLUCERNA SHAKE)  237 mL Oral BID BM  . heparin  5,000 Units Subcutaneous Q8H  . insulin aspart  0-9 Units Subcutaneous Q4H  . insulin detemir  20 Units Subcutaneous BID  . mouth rinse  15 mL Mouth Rinse BID  . pantoprazole  40 mg Oral Daily   Continuous Infusions: . sodium chloride 50 mL/hr at 08/19/17 1400     LOS: 2 days   Time Spent in minutes   30 minutes  Rosslyn Pasion D.O. on 08/19/2017 at 2:54 PM  Between 7am to 7pm - Pager - [phone]  After 7pm go to [email or web address] - password [email or web address]  And look for the night coverage person covering for me after hours  [facility] Office  [phone]

## 2017-08-19 NOTE — Care Management Note (Addendum)
Case Management Note  Patient Details  Name: Dolores HooseWilliam S Mcclenney MRN: [id number] Date of Birth: 03/05/1966  Subjective/Objective: substance abuse and DM presents to ED lethargic with glucose of 1674 and PH 6.8, reports recent GI and flu like symptoms. [facility]                        Action/Plan: NCM will follow for care of transition needs.  Expected Discharge Date:                  Expected Discharge Plan:  Home/Self Care  In-House Referral:     Discharge planning Services  CM Consult  Post Acute Care Choice:    Choice offered to:     DME Arranged:    DME Agency:     HH Arranged:    HH Agency:     Status of Service:  In process, will continue to follow  If discussed at Long Length of Stay Meetings, dates discussed:    Additional Comments:  Leone Havenaylor, Maryclare Nydam Clinton, RN 08/19/2017, 1:56 PM

## 2017-08-19 NOTE — Plan of Care (Signed)
  Not Progressing Clinical Measurements: Diagnostic test results will improve 08/19/2017 0636 - Not Progressing by Valkyrie Guardiola A, RN Note Patient was scheduled to be transferred to telemetry ; however his blood sugar was 300- giving us pause that his CBG's were increasing again * possibly going back into DKA. C/O nausea and feeling " bad." Patient had no appetite during day and ate very little. Patient kept on unit and sliding scale changed. Given 20 of lantus. And maintained blood sugars overnight in 150-300 range. MD wished to monitor labs for patient safety.   Noe GensStefanie A Sama Arauz, RN

## 2017-08-19 NOTE — Progress Notes (Signed)
Inpatient Diabetes Program Recommendations  AACE/ADA: New Consensus Statement on Inpatient Glycemic Control (2015)  Target Ranges:  Prepandial:   less than 140 mg/dL      Peak postprandial:   less than 180 mg/dL (1-2 hours)      Critically ill patients:  140 - 180 mg/dL   Lab Results  Component Value Date   GLUCAP 125 (H) 08/19/2017   HGBA1C 11.2 (H) 06/01/2017    Review of Glycemic Control  Diabetes history: DM2 Outpatient Diabetes medications: 7030 40 units bid (not taking) Current orders for Inpatient glycemic control: Novolog 0-9 units Q4H, Levemir 20 units bid  Blood sugars much improved this am. HgbA1C of 11.2% on 06/01/2017  Inpatient Diabetes Program Recommendations:     Needs updated HgbA1C, please order. Add Novolog 4 units tidwc for meal coverage insulin if pt eats > 50% meal If FBS > 180 mg/dL, increase Levemir to 22 units bid  Will speak with pt regarding his glucose control at home and why he isn't taking his insulin.  Continue to follow.  Thank you. Rickey Smith, RD, LDN, CDE Inpatient Diabetes Coordinator [phone]

## 2017-08-20 LAB — BASIC METABOLIC PANEL
Anion gap: 7 (ref 5–15)
BUN: 8 mg/dL (ref 6–20)
CALCIUM: 8 mg/dL — AB (ref 8.9–10.3)
CO2: 27 mmol/L (ref 22–32)
CREATININE: 0.6 mg/dL — AB (ref 0.61–1.24)
Chloride: 102 mmol/L (ref 101–111)
GFR calc non Af Amer: >60 mL/min (ref >60)
Glucose, Bld: 94 mg/dL (ref 65–99)
Potassium: 3.1 mmol/L — ABNORMAL LOW (ref 3.5–5.1)
SODIUM: 136 mmol/L (ref 135–145)

## 2017-08-20 LAB — GLUCOSE, CAPILLARY
GLUCOSE-CAPILLARY: 65 mg/dL (ref 65–99)
GLUCOSE-CAPILLARY: 81 mg/dL (ref 65–99)
GLUCOSE-CAPILLARY: 132 mg/dL — AB (ref 65–99)
Glucose-Capillary: 115 mg/dL — ABNORMAL HIGH (ref 65–99)
Glucose-Capillary: 153 mg/dL — ABNORMAL HIGH (ref 65–99)
Glucose-Capillary: 229 mg/dL — ABNORMAL HIGH (ref 65–99)
Glucose-Capillary: 237 mg/dL — ABNORMAL HIGH (ref 65–99)
Glucose-Capillary: 69 mg/dL (ref 65–99)

## 2017-08-20 LAB — CBC WITH DIFFERENTIAL/PLATELET
BASOS ABS: 0 10*3/uL (ref 0.0–0.1)
Basophils Relative: 0 %
EOS PCT: 0 %
Eosinophils Absolute: 0 10*3/uL (ref 0.0–0.7)
HCT: 29.5 % — ABNORMAL LOW (ref 39.0–52.0)
Hemoglobin: 10.1 g/dL — ABNORMAL LOW (ref 13.0–17.0)
LYMPHS PCT: 13 %
Lymphs Abs: 0.8 10*3/uL (ref 0.7–4.0)
MCH: 29.4 pg (ref 26.0–34.0)
MCHC: 34.2 g/dL (ref 30.0–36.0)
MCV: 86 fL (ref 78.0–100.0)
MONO ABS: 0.7 10*3/uL (ref 0.1–1.0)
Monocytes Relative: 11 %
Neutro Abs: 5 10*3/uL (ref 1.7–7.7)
Neutrophils Relative %: 76 %
PLATELETS: 97 10*3/uL — AB (ref 150–400)
RBC: 3.43 MIL/uL — ABNORMAL LOW (ref 4.22–5.81)
RDW: 14 % (ref 11.5–15.5)
WBC: 6.5 10*3/uL (ref 4.0–10.5)

## 2017-08-20 LAB — CULTURE, BLOOD (ROUTINE X 2)

## 2017-08-20 LAB — MAGNESIUM: Magnesium: 2.3 mg/dL (ref 1.7–2.4)

## 2017-08-20 LAB — PHOSPHORUS: Phosphorus: 1.6 mg/dL — ABNORMAL LOW (ref 2.5–4.6)

## 2017-08-20 MED ORDER — K PHOS MONO-SOD PHOS DI & MONO 155-852-130 MG PO TABS
500.0000 mg | ORAL_TABLET | Freq: Once | ORAL | Status: AC
  Administered 2017-08-20: 500 mg via ORAL
  Filled 2017-08-20: qty 2

## 2017-08-20 MED ORDER — MENTHOL 3 MG MT LOZG: 1.0000 | LOZENGE | OROMUCOSAL | Status: DC | PRN

## 2017-08-20 MED ORDER — POTASSIUM CHLORIDE CRYS ER 20 MEQ PO TBCR
40.0000 meq | EXTENDED_RELEASE_TABLET | Freq: Once | ORAL | Status: AC
  Administered 2017-08-20: 40 meq via ORAL
  Filled 2017-08-20: qty 2

## 2017-08-20 NOTE — Plan of Care (Signed)
  Not Progressing Nutritional: Maintenance of adequate nutrition will improve 08/20/2017 0601 - Not Progressing by Karmon Andis A, RN Note Patient still with poor appetite; however, he is tolerating and drinking glucerna shakes with no problems. Patient had one episode of hypoglycemia overnight d/t poor nutritional intake.  Elimination: Will not experience complications related to bowel motility 08/20/2017 0601 - Not Progressing by Nihira Puello A, RN Note Patient c/o gas pain and unable to have BM- given stool softener and laxative to assist with BM per MD.

## 2017-08-20 NOTE — Progress Notes (Signed)
Hypoglycemic Event  CBG: 65  Treatment: 15 GM carbohydrate snack  Symptoms: None  Follow-up CBG: Time:0355 CBG Result: 81  Possible Reasons for Event: Inadequate meal intake  Comments/MD notified:Patient receiving 20 units levemir BID. Poor appetite.    Rickey Smith Rickey Smith

## 2017-08-20 NOTE — Progress Notes (Addendum)
Patient  Alert and oriented. Patient oriented to unit protocol and to room. IV sites intact and looks good , IVF infusing into the EJ. Patient Skin intact. Patient has call bell and phone within reach. Patient  placed on telemetry box #9 and verified with NT Qu[place]. SCD's ordered.

## 2017-08-20 NOTE — Progress Notes (Signed)
PROGRESS NOTE    Rickey Smith  UJW:[id number] DOB: Jul 17, 1965 DOA: 08/17/2017 PCP: Marcine Matar, MD   Brief Narrative:  HPI on 08/17/2017 by Ms. [name], NP 52 year old male with Type one DM and polysubstance abuse   Presents to ED on 2/25 with AMS. Upon arrival patient severely encephalopathic. Glucose 1674, pH 6.8. Given 3L NS and started on insulin gtt. PCCM asked to admit.   After patient was able to wake up and reported to staff that he has had Flu like symptoms since Wednesday and then starting Friday had ABD pain with nausea/vomiting. Stated that he does cocaine weekly and last use was Friday.   Interim history  Patient was admitted to ICU for DKA.  [facility] assumed care on 08/19/2017. Assessment & Plan   Diabetic ketoacidosis with hyperglycemia/Uncontrolled diabetes -On admission, patient did have a pH of 7.1 and Anion gap was unable to be calculated. Blood sugar 1674 -Patient required admission to ICU and required insulin drip -Patient transition to Levemir and insulin sliding scale -Continue CBG monitoring -Last hemoglobin A1c 11.2 on 06/01/2017 -blood sugars have been controlled  Acute bronchitis -Currently on room air maintaining oxygen saturations in the high 90s -Respiratory viral panel negative -Continue azithromycin  Acute kidney injury  -Resolved -likely secondary to DKA and dehydration -Creatinine 1.82 on admission, currently 0.60 -Continue to monitor BMP  Abdominal pain/nausea -KUB obtained showing nonobstructive gas pattern -Right upper quadrant ultrasound shows  right hepatic lobe lesion, echogenic -CT scan back in  August 2017 showed right elbow subpleural nodule, splenic cyst, bilateral adrenal adenomas, left kidney cyst-patient will need outpatient follow-up -Have added Protonix, Maalox/Mylanta as needed -Obtained repeat UA lower abdominal pain, however UA unremarkable for infection.  Patient did have a Foley catheter in place, which may  be the cause of his pain.  -patient states his abdominal pain is mildly better today  Hyperkalemia/hyponatremia (pseudo) -Resolved -secondary to DKA -continue to monitor BMP  Hypokalemia -will replace and continue to monitor BMP  Hypophosphatemia -phos 1.6, will replace and continue to monitor   Acute metabolic encephalopathy -Appears to have resolved, Currently AAOx3 -suspect secondary to DKA versus substance abuse  Polysubstance abuse -UDS positive for cocaine  Coag negative Staph -1/2 blood cultures on 08/17/2017 showed SCN -Suspect contaminant  DVT Prophylaxis  SCDs  Code Status: Full  Family Communication: None at bedside  Disposition Plan: Admitted. Transfer to med floor.   Consultants PCCM  Procedures  Abdominal US  Antibiotics   Anti-infectives (From admission, onward)   Start     Dose/Rate Route Frequency Ordered Stop   08/19/17 1400  metroNIDAZOLE (FLAGYL) IVPB 500 mg  Status:  Discontinued     500 mg 100 mL/hr over 60 Minutes Intravenous Every 8 hours 08/18/17 1026 08/18/17 1043   08/18/17 1400  piperacillin-tazobactam (ZOSYN) IVPB 3.375 g  Status:  Discontinued     3.375 g 12.5 mL/hr over 240 Minutes Intravenous Every 8 hours 08/18/17 0558 08/18/17 1026   08/18/17 1400  cefTRIAXone (ROCEPHIN) 2 g in sodium chloride 0.9 % 100 mL IVPB  Status:  Discontinued     2 g 200 mL/hr over 30 Minutes Intravenous Every 24 hours 08/18/17 1026 08/18/17 1043   08/18/17 0630  piperacillin-tazobactam (ZOSYN) IVPB 3.375 g     3.375 g 100 mL/hr over 30 Minutes Intravenous  Once 08/18/17 0558 08/18/17 0650      Subjective:   Pasty Spillers seen and examined today.  Patient states he is feeling mildly  better today.  Would like to try eating more today.  Denies any current chest pain, shortness breath, vomiting, diarrhea or constipation.  Has mild abdominal pain with intermittent nausea.  Denies any dizziness or headache.  Objective:   Vitals:   08/20/17 0600  08/20/17 0700 08/20/17 0717 08/20/17 0800  BP: 110/77 115/75    Pulse:      Resp: (!) 23 17  12   Temp:   98.5 F (36.9 C)   TempSrc:   Oral   SpO2: 97% 98%  99%  Weight:      Height:        Intake/Output Summary (Last 24 hours) at 08/20/2017 1022 Last data filed at 08/20/2017 0800 Gross per 24 hour  Intake 1580 ml  Output 1330 ml  Net 250 ml   Filed Weights   08/17/17 2213 08/18/17 0045  Weight: 72.6 kg (160 lb) 76.7 kg (169 lb 1.5 oz)   Exam  General: Well developed, well nourished, NAD, appears stated age  HEENT: NCAT, mucous membranes moist.   Neck: Supple  Cardiovascular: S1 S2 auscultated, no rubs, murmurs or gallops. Regular rate and rhythm.  Respiratory: Clear to auscultation bilaterally with equal chest rise  Abdomen: Soft, mildly tender to palpation lower abdomen, nondistended, + bowel sounds  Extremities: warm dry without cyanosis clubbing or edema  Neuro: AAOx3, nonfocal  Skin: Without rashes exudates or nodules, multiple tattoos  Psych: appropriate mood and affect  Data Reviewed: I have personally reviewed following labs and imaging studies  CBC: Recent Labs  Lab 08/17/17 2211 08/17/17 2225 08/18/17 1250 08/19/17 0511 08/20/17 0431  WBC 23.8*  --  16.3* 10.0 6.5  NEUTROABS 19.8*  --  13.7* 8.3* 5.0  HGB 11.8* 14.3 10.5* 10.4* 10.1*  HCT 45.6 42.0 29.5* 29.7* 29.5*  MCV 113.7*  --  80.6 81.6 86.0  PLT 307  --  184 134* 97*   Basic Metabolic Panel: Recent Labs  Lab 08/18/17 0142 08/18/17 0735 08/18/17 1659 08/19/17 0511 08/20/17 0431  NA 121* 132* 137 137 136  K 5.7* 3.7 3.6 3.5 3.1*  CL 87* 99* 106 104 102  CO2 9* 18* 22 23 27   GLUCOSE 1,097* 435* 172* 168* 94  BUN 52* 46* 33* 16 8  CREATININE 2.44* 1.61* 1.05 0.75 0.60*  CALCIUM 7.3* 8.0* 7.8* 8.2* 8.0*  MG  --  2.0  --  2.3 2.3  PHOS  --  1.3*  --  2.8 1.6*   GFR: Estimated Creatinine Clearance: 119.8 mL/min (A) (by C-G formula based on SCr of 0.6 mg/dL (L)). Liver Function  Tests: Recent Labs  Lab 08/17/17 2211 08/17/17 2328  AST 21 32  ALT 12* 17  ALKPHOS 115 131*  BILITOT 1.9* 1.9*  PROT 5.5* 5.7*  ALBUMIN 3.4* 3.3*   Recent Labs  Lab 08/18/17 0048  LIPASE 64*   No results for input(s): AMMONIA in the last 168 hours. Coagulation Profile: No results for input(s): INR, PROTIME in the last 168 hours. Cardiac Enzymes: Recent Labs  Lab 08/17/17 2345  CKTOTAL 135   BNP (last 3 results) No results for input(s): PROBNP in the last 8760 hours. HbA1C: No results for input(s): HGBA1C in the last 72 hours. CBG: Recent Labs  Lab 08/19/17 1941 08/19/17 2330 08/20/17 0331 08/20/17 0356 08/20/17 0715  GLUCAP 91 152* 65 81 69   Lipid Profile: No results for input(s): CHOL, HDL, LDLCALC, TRIG, CHOLHDL, LDLDIRECT in the last 72 hours. Thyroid Function Tests: No results for input(s): TSH,  T4TOTAL, FREET4, T3FREE, THYROIDAB in the last 72 hours. Anemia Panel: No results for input(s): VITAMINB12, FOLATE, FERRITIN, TIBC, IRON, RETICCTPCT in the last 72 hours. Urine analysis:    Component Value Date/Time   COLORURINE YELLOW 08/19/2017 0826   APPEARANCEUR CLEAR 08/19/2017 0826   LABSPEC 1.023 08/19/2017 0826   PHURINE 5.0 08/19/2017 0826   GLUCOSEU >=500 (A) 08/19/2017 0826   HGBUR NEGATIVE 08/19/2017 0826   BILIRUBINUR NEGATIVE 08/19/2017 0826   BILIRUBINUR neg 08/10/2015 1041   KETONESUR 20 (A) 08/19/2017 0826   PROTEINUR NEGATIVE 08/19/2017 0826   UROBILINOGEN 2.0 08/10/2015 1041   UROBILINOGEN 0.2 10/23/2014 0817   NITRITE NEGATIVE 08/19/2017 0826   LEUKOCYTESUR NEGATIVE 08/19/2017 0826   Sepsis Labs: @LABRCNTIP (procalcitonin:4,lacticidven:4)  ) Recent Results (from the past 240 hour(s))  Culture, blood (routine x 2)     Status: Abnormal   Collection Time: 08/17/17 10:17 PM  Result Value Ref Range Status   Specimen Description BLOOD  Final   Special Requests   Final    RIGHT EJ BOTTLES DRAWN AEROBIC ONLY Blood Culture adequate  volume   Culture  Setup Time   Final    GRAM POSITIVE COCCI ANAEROBIC BOTTLE ONLY CRITICAL RESULT CALLED TO, READ BACK BY AND VERIFIED WITH: Sophronia Simas PHARMD 2019 08/18/17 A BROWNING    Culture (A)  Final    STAPHYLOCOCCUS SPECIES (COAGULASE NEGATIVE) THE SIGNIFICANCE OF ISOLATING THIS ORGANISM FROM A SINGLE SET OF BLOOD CULTURES WHEN MULTIPLE SETS ARE DRAWN IS UNCERTAIN. PLEASE NOTIFY THE MICROBIOLOGY DEPARTMENT WITHIN ONE WEEK IF SPECIATION AND SENSITIVITIES ARE REQUIRED. Performed at [facility], [address], [place], [place] [address]    Report Status 08/20/2017 FINAL  Final  Blood Culture ID Panel (Reflexed)     Status: Abnormal   Collection Time: 08/17/17 10:17 PM  Result Value Ref Range Status   Enterococcus species NOT DETECTED NOT DETECTED Final   Listeria monocytogenes NOT DETECTED NOT DETECTED Final   Staphylococcus species DETECTED (A) NOT DETECTED Final    Comment: Methicillin (oxacillin) susceptible coagulase negative staphylococcus. Possible blood culture contaminant (unless isolated from more than one blood culture draw or clinical case suggests pathogenicity). No antibiotic treatment is indicated for blood  culture contaminants. CRITICAL RESULT CALLED TO, READ BACK BY AND VERIFIED WITH: Sophronia Simas PHARMD 2019 08/18/17 A BROWNING    Staphylococcus aureus NOT DETECTED NOT DETECTED Final   Methicillin resistance NOT DETECTED NOT DETECTED Final   Streptococcus species NOT DETECTED NOT DETECTED Final   Streptococcus agalactiae NOT DETECTED NOT DETECTED Final   Streptococcus pneumoniae NOT DETECTED NOT DETECTED Final   Streptococcus pyogenes NOT DETECTED NOT DETECTED Final   Acinetobacter baumannii NOT DETECTED NOT DETECTED Final   Enterobacteriaceae species NOT DETECTED NOT DETECTED Final   Enterobacter cloacae complex NOT DETECTED NOT DETECTED Final   Escherichia coli NOT DETECTED NOT DETECTED Final   Klebsiella oxytoca NOT DETECTED NOT DETECTED Final   Klebsiella  pneumoniae NOT DETECTED NOT DETECTED Final   Proteus species NOT DETECTED NOT DETECTED Final   Serratia marcescens NOT DETECTED NOT DETECTED Final   Haemophilus influenzae NOT DETECTED NOT DETECTED Final   Neisseria meningitidis NOT DETECTED NOT DETECTED Final   Pseudomonas aeruginosa NOT DETECTED NOT DETECTED Final   Candida albicans NOT DETECTED NOT DETECTED Final   Candida glabrata NOT DETECTED NOT DETECTED Final   Candida krusei NOT DETECTED NOT DETECTED Final   Candida parapsilosis NOT DETECTED NOT DETECTED Final   Candida tropicalis NOT DETECTED NOT DETECTED Final  Comment: Performed at [facility], [address] [place], [place] [address]  Culture, blood (routine x 2)     Status: None (Preliminary result)   Collection Time: 08/17/17 10:35 PM  Result Value Ref Range Status   Specimen Description BLOOD LEFT FOREARM  Final   Special Requests   Final    BOTTLES DRAWN AEROBIC AND ANAEROBIC Blood Culture adequate volume   Culture   Final    NO GROWTH 2 DAYS Performed at [facility], [address] [place], [place] [address]    Report Status PENDING  Incomplete  Respiratory Panel by PCR     Status: None   Collection Time: 08/18/17 12:10 AM  Result Value Ref Range Status   Adenovirus NOT DETECTED NOT DETECTED Final   Coronavirus 229E NOT DETECTED NOT DETECTED Final   Coronavirus HKU1 NOT DETECTED NOT DETECTED Final   Coronavirus NL63 NOT DETECTED NOT DETECTED Final   Coronavirus OC43 NOT DETECTED NOT DETECTED Final   Metapneumovirus NOT DETECTED NOT DETECTED Final   Rhinovirus / Enterovirus NOT DETECTED NOT DETECTED Final   Influenza A NOT DETECTED NOT DETECTED Final   Influenza B NOT DETECTED NOT DETECTED Final   Parainfluenza Virus 1 NOT DETECTED NOT DETECTED Final   Parainfluenza Virus 2 NOT DETECTED NOT DETECTED Final   Parainfluenza Virus 3 NOT DETECTED NOT DETECTED Final   Parainfluenza Virus 4 NOT DETECTED NOT DETECTED Final   Respiratory Syncytial  Virus NOT DETECTED NOT DETECTED Final   Bordetella pertussis NOT DETECTED NOT DETECTED Final   Chlamydophila pneumoniae NOT DETECTED NOT DETECTED Final   Mycoplasma pneumoniae NOT DETECTED NOT DETECTED Final    Comment: Performed at [facility], [address] [place], [place] [address]  MRSA PCR Screening     Status: None   Collection Time: 08/18/17  1:15 AM  Result Value Ref Range Status   MRSA by PCR NEGATIVE NEGATIVE Final    Comment:        The GeneXpert MRSA Assay (FDA approved for NASAL specimens only), is one component of a comprehensive MRSA colonization surveillance program. It is not intended to diagnose MRSA infection nor to guide or monitor treatment for MRSA infections. Performed at [facility], [address] [place], [place] [address]       Radiology Studies: Dg Abd 1 View  Result Date: 08/19/2017 CLINICAL DATA:  Abdominal pain for 3 days, some vomiting EXAM: ABDOMEN - 1 VIEW COMPARISON:  Abdomen films of 08/17/2017 FINDINGS: Both large and small bowel gas is present without distension. Air is seen to the rectum. No opaque calculi are noted. There are degenerative changes present in the lower lumbar spine. IMPRESSION: 1. Nonspecific bowel gas pattern.  No bowel obstruction. 2. No opaque calculi are noted. Electronically Signed   By: Dwyane DeePaul  Barry M.D.   On: 08/19/2017 09:05     Scheduled Meds: . docusate sodium  100 mg Oral BID  . feeding supplement (GLUCERNA SHAKE)  237 mL Oral BID BM  . heparin  5,000 Units Subcutaneous Q8H  . insulin aspart  0-9 Units Subcutaneous Q4H  . insulin detemir  20 Units Subcutaneous BID  . mouth rinse  15 mL Mouth Rinse BID  . pantoprazole  40 mg Oral Daily   Continuous Infusions: . sodium chloride 50 mL/hr at 08/20/17 0800     LOS: 3 days   Time Spent in minutes   30 minutes  Loyce Flaming D.O. on 08/20/2017 at  10:22 AM  Between 7am to 7pm - Pager - [phone]  After 7pm go to [email or web address] - password  [email or web address]  And look for the night coverage person covering for me after hours  [facility] Office  [phone]

## 2017-08-21 DIAGNOSIS — E1311 Other specified diabetes mellitus with ketoacidosis with coma: Secondary | ICD-10-CM

## 2017-08-21 DIAGNOSIS — G9341 Metabolic encephalopathy: Secondary | ICD-10-CM

## 2017-08-21 DIAGNOSIS — J989 Respiratory disorder, unspecified: Secondary | ICD-10-CM

## 2017-08-21 DIAGNOSIS — R1084 Generalized abdominal pain: Secondary | ICD-10-CM

## 2017-08-21 LAB — CBC WITH DIFFERENTIAL/PLATELET
BASOS ABS: 0 10*3/uL (ref 0.0–0.1)
BASOS PCT: 0 %
EOS ABS: 0 10*3/uL (ref 0.0–0.7)
EOS PCT: 0 %
HCT: 33.7 % — ABNORMAL LOW (ref 39.0–52.0)
Hemoglobin: 11 g/dL — ABNORMAL LOW (ref 13.0–17.0)
Lymphocytes Relative: 11 %
Lymphs Abs: 1.4 10*3/uL (ref 0.7–4.0)
MCH: 28.6 pg (ref 26.0–34.0)
MCHC: 32.6 g/dL (ref 30.0–36.0)
MCV: 87.5 fL (ref 78.0–100.0)
Monocytes Absolute: 1.3 10*3/uL — ABNORMAL HIGH (ref 0.1–1.0)
Monocytes Relative: 10 %
Neutro Abs: 10.5 10*3/uL — ABNORMAL HIGH (ref 1.7–7.7)
Neutrophils Relative %: 79 %
PLATELETS: 137 10*3/uL — AB (ref 150–400)
RBC: 3.85 MIL/uL — AB (ref 4.22–5.81)
RDW: 13.3 % (ref 11.5–15.5)
WBC: 13.2 10*3/uL — AB (ref 4.0–10.5)

## 2017-08-21 LAB — BASIC METABOLIC PANEL
Anion gap: 8 (ref 5–15)
BUN: 7 mg/dL (ref 6–20)
CALCIUM: 8.5 mg/dL — AB (ref 8.9–10.3)
CO2: 26 mmol/L (ref 22–32)
CREATININE: 0.64 mg/dL (ref 0.61–1.24)
Chloride: 103 mmol/L (ref 101–111)
GFR calc non Af Amer: >60 mL/min (ref >60)
Glucose, Bld: 66 mg/dL (ref 65–99)
Potassium: 4.1 mmol/L (ref 3.5–5.1)
Sodium: 137 mmol/L (ref 135–145)

## 2017-08-21 LAB — GLUCOSE, CAPILLARY
GLUCOSE-CAPILLARY: 150 mg/dL — AB (ref 65–99)
GLUCOSE-CAPILLARY: 241 mg/dL — AB (ref 65–99)
Glucose-Capillary: 60 mg/dL — ABNORMAL LOW (ref 65–99)
Glucose-Capillary: 100 mg/dL — ABNORMAL HIGH (ref 65–99)
Glucose-Capillary: 201 mg/dL — ABNORMAL HIGH (ref 65–99)
Glucose-Capillary: 116 mg/dL — ABNORMAL HIGH (ref 65–99)

## 2017-08-21 LAB — MAGNESIUM: MAGNESIUM: 2.4 mg/dL (ref 1.7–2.4)

## 2017-08-21 LAB — PHOSPHORUS: PHOSPHORUS: 2.5 mg/dL (ref 2.5–4.6)

## 2017-08-21 MED ORDER — INSULIN NPH ISOPHANE & REGULAR (70-30) 100 UNIT/ML ~~LOC~~ SUSP: SUBCUTANEOUS | 0 refills | Status: DC

## 2017-08-21 NOTE — Progress Notes (Signed)
Hypoglycemic Event  CBG: 60  Treatment: 15 GM carbohydrate snack  Symptoms: None  Follow-up CBG: WUJW:[id number] CBG Result:150  Possible Reasons for Event: Inadequate meal intake  Comments/MD notified: On-call NP K. Schorr notified    Rickey RoseKaelin M Smith

## 2017-08-21 NOTE — Discharge Instructions (Addendum)
Correction Insulin Correction insulin, also called corrective insulin or a supplemental dose, is a small amount of insulin that can be used to lower your blood sugar (glucose) if it is too high. You may be instructed to check your blood glucose at certain times of the day and to use correction insulin as needed to lower your blood glucose to your target range. Correction insulin is primarily used as part of diabetes management. It may also be prescribed for people who do not have diabetes. What is a correction scale? A correction scale, also called a sliding scale, is prescribed by your health care provider to help you determine when you need correction insulin. Your correction scale is based on your individual treatment goals, and it has two parts:  Ranges of blood glucose levels.  How much correction insulin to give yourself if your blood sugar falls within a certain range.  If your blood glucose is in your desired range, you will not need correction insulin and you should take your normal insulin dose. What type of insulin do I need? Your health care provider may prescribe rapid-acting or short-acting insulin for you to use as correction insulin. Rapid-acting insulin:  Starts working quickly, in as little as 5 minutes.  Can last for 3-6 hours.  Works well when taken right before a meal to quickly lower blood glucose. Short-acting insulin:  Starts working in about 30 minutes.  Can last for 6-8 hours.  Should be taken about 30 minutes before you start eating a meal. Talk with your health care provider or pharmacist about which type of correction insulin to take and when to take it. If you use insulin to control your diabetes, you should use correction insulin in addition to the longer-acting (basal) insulin that you normally use. How do I manage my blood glucose with correction insulin? Giving a correction dose  Check your blood glucose as directed by your health care  provider.  Use your correction scale to find the range that your blood glucose is in.  Identify the units of insulin that match your blood glucose range.  Make sure you have food available that you can eat in the next 15-30 minutes, after your correction dose.  Give yourself the dose of correction insulin that your health care provider has prescribed in your correction scale. Always make sure you are using the right type of insulin. ? If your correction insulin is rapid-acting, start eating a meal within 15 minutes after your correction dose to keep your blood glucose from getting too low. ? If your correction insulin is short-acting, start eating a meal within 30 minutes after your correction dose to keep your blood glucose from getting too low. Keeping a blood glucose log  Write down your blood glucose test results and the amount of insulin that you give yourself. Do this every time you check blood glucose or take insulin. Bring this log with you to your medical visits. This information will help your health care provider to manage your medicines.  Note anything that may affect your blood glucose, such as: ? Changes in normal exercise or activity. ? Changes in your normal schedule, such as changes in your sleep routine, going on vacation, changing your diet, or holidays. ? New over-the-counter or prescription medicines. ? Illness, stress, or anxiety. ? Changes in the time that you took your medicine or insulin. ? Changes in your meals, such as skipping a meal, having a late meal, or dining out. ? Eating  things that may affect blood glucose, such as snacks, meal portions that are larger than normal, drinks that contain sugar, or eating less than usual. What do I need to know about hyperglycemia and hypoglycemia? What is hyperglycemia? Hyperglycemia, also called high blood glucose, occurs when blood glucose is too high. Make sure you know the early signs of hyperglycemia, such  as:  Increased thirst.  Hunger.  Feeling very tired.  Needing to urinate more often than usual.  Blurry vision.  What is hypoglycemia? Hypoglycemia is also called low blood glucose. Be aware of stacking your insulin doses. This happens when you correct a high blood glucose by giving yourself extra insulin too soon after a previous correction dose or mealtime dose. This may cause you to have too much insulin in your body and may put you at risk for hypoglycemia. Hypoglycemia occurs with a blood glucose level at or below 70 mg/dL (3.9 mmol/L). It is important to know the symptoms of hypoglycemia and treat it right away. Always have a 15-gram rapid-acting carbohydrate snack with you to treat low blood glucose. Family members and close friends should also know the symptoms and should understand how to treat hypoglycemia, in case you are not able to treat yourself. What are the symptoms of hypoglycemia? Hypoglycemia symptoms can include:  Hunger.  Anxiety.  Sweating and feeling clammy.  Confusion.  Dizziness or light-headedness.  Sleepiness.  Nausea.  Increased heart rate.  Headache.  Blurry vision.  Jerky movements that you cannot control (seizure).  Nightmares.  Tingling or numbness around the mouth, lips, or tongue.  A change in speech.  Decreased ability to concentrate.  A change in coordination.  Restless sleep.  Tremors or shakes.  Fainting.  Irritability.  How do I treat hypoglycemia? If you are alert and able to swallow safely, follow the 15:15 rule:  Take 15 grams of a rapid-acting carbohydrate. Rapid-acting options include: ? 1 tube of glucose gel. ? 3 glucose pills. ? 6-8 pieces of hard candy. ? 4 oz (120 mL) of fruit juice. ? 4 oz (120 mL) of regular (not diet) soda.  Check your blood glucose 15 minutes after you take the carbohydrate. ? If the repeat blood glucose level is still at or below 70 mg/dL (3.9 mmol/L), take 15 grams of a  carbohydrate again. ? If your blood glucose level does not increase above 70 mg/dL (3.9 mmol/L) after 3 tries, seek emergency medical care.  After your blood glucose level returns to normal, eat a meal or a snack within 1 hour.  How do I treat severe hypoglycemia? Severe hypoglycemia is when your blood glucose level is at or below 54 mg/dL (3 mmol/L). Severe hypoglycemia is an emergency. Do not wait to see if the symptoms will go away. Get medical help right away. Call your local emergency services (911 in the [place] Do not drive yourself to the hospital. If you have severe hypoglycemia and you cannot eat or drink, you may need an injection of glucagon. A family member or close friend should learn how to check your blood glucose and how to give you a glucagon injection. Ask your health care provider if you need to have an emergency glucagon injection kit available. Severe hypoglycemia may need to be treated in a hospital. The treatment may include getting glucose through an IV tube. You may also need treatment for the cause of your hypoglycemia. Why do I need correction insulin if I do not have diabetes? If you do not have  diabetes, your health care provider may prescribe insulin because:  Keeping your blood glucose in the target range is important for your overall health.  You are taking medicines that cause your blood glucose to be higher than normal.  Contact a health care provider if:  You develop low blood glucose that you are not able to treat yourself.  You have high blood glucose that you are not able to correct with correction insulin.  Your blood glucose is often too low.  You used emergency glucagon to treat low blood glucose. Get help right away if:  You become unresponsive. If this happens, someone else should call emergency services (911 in the [place] right away.  Your blood glucose is lower than 54 mg/dL (3.0 mmol/L).  You become confused or you have trouble thinking  clearly.  You have difficulty breathing. Summary  Correction insulin is a small amount of insulin that can be used to lower your blood sugar (glucose) if it is too high.  Talk with your health care provider or pharmacist about which type of correction insulin to take and when to take it. If you use insulin to control your diabetes, you should use correction insulin in addition to the longer-acting (basal) insulin that you normally use.  You may be instructed to check your blood glucose at certain times of the day and to use correction insulin as needed to lower your blood glucose to your target range. Always keep a log of your blood glucose values and the amount of insulin that you used.  It is important to know the symptoms of hypoglycemia and treat it right away. Always have a 15-gram rapid-acting carbohydrate snack with you to treat low blood glucose. Family members and close friends should also know the symptoms and should understand how to treat hypoglycemia, in case you are not able to treat yourself. This information is not intended to replace advice given to you by your health care provider. Make sure you discuss any questions you have with your health care provider. Document Released: 11/01/2010 Document Revised: 03/08/2016 Document Reviewed: 03/08/2016 Elsevier Interactive Patient Education  2018 [employer]. Blood Glucose Monitoring, Adult Monitoring your blood sugar (glucose) helps you manage your diabetes. It also helps you and your health care provider determine how well your diabetes management plan is working. Blood glucose monitoring involves checking your blood glucose as often as directed, and keeping a record (log) of your results over time. Why should I monitor my blood glucose? Checking your blood glucose regularly can:  Help you understand how food, exercise, illnesses, and medicines affect your blood glucose.  Let you know what your blood glucose is at any time. You  can quickly tell if you are having low blood glucose (hypoglycemia) or high blood glucose (hyperglycemia).  Help you and your health care provider adjust your medicines as needed.  When should I check my blood glucose? Follow instructions from your health care provider about how often to check your blood glucose. This may depend on:  The type of diabetes you have.  How well-controlled your diabetes is.  Medicines you are taking.  If you have type 1 diabetes:  Check your blood glucose at least 2 times a day.  Also check your blood glucose: ? Before every insulin injection. ? Before and after exercise. ? Between meals. ? 2 hours after a meal. ? Occasionally between 2:00 a.m. and 3:00 a.m., as directed. ? Before potentially dangerous tasks, like driving or using heavy machinery. ?  At bedtime.  You may need to check your blood glucose more often, up to 6-10 times a day: ? If you use an insulin pump. ? If you need multiple daily injections (MDI). ? If your diabetes is not well-controlled. ? If you are ill. ? If you have a history of severe hypoglycemia. ? If you have a history of not knowing when your blood glucose is getting low (hypoglycemia unawareness). If you have type 2 diabetes:  If you take insulin or other diabetes medicines, check your blood glucose at least 2 times a day.  If you are on intensive insulin therapy, check your blood glucose at least 4 times a day. Occasionally, you may also need to check between 2:00 a.m. and 3:00 a.m., as directed.  Also check your blood glucose: ? Before and after exercise. ? Before potentially dangerous tasks, like driving or using heavy machinery.  You may need to check your blood glucose more often if: ? Your medicine is being adjusted. ? Your diabetes is not well-controlled. ? You are ill. What is a blood glucose log?  A blood glucose log is a record of your blood glucose readings. It helps you and your health care  provider: ? Look for patterns in your blood glucose over time. ? Adjust your diabetes management plan as needed.  Every time you check your blood glucose, write down your result and notes about things that may be affecting your blood glucose, such as your diet and exercise for the day.  Most glucose meters store a record of glucose readings in the meter. Some meters allow you to download your records to a computer. How do I check my blood glucose? Follow these steps to get accurate readings of your blood glucose: Supplies needed   Blood glucose meter.  Test strips for your meter. Each meter has its own strips. You must use the strips that come with your meter.  A needle to prick your finger (lancet). Do not use lancets more than once.  A device that holds the lancet (lancing device).  A journal or log book to write down your results. Procedure  Wash your hands with soap and water.  Prick the side of your finger (not the tip) with the lancet. Use a different finger each time.  Gently rub the finger until a small drop of blood appears.  Follow instructions that come with your meter for inserting the test strip, applying blood to the strip, and using your blood glucose meter.  Write down your result and any notes. Alternative testing sites  Some meters allow you to use areas of your body other than your finger (alternative sites) to test your blood.  If you think you may have hypoglycemia, or if you have hypoglycemia unawareness, do not use alternative sites. Use your finger instead.  Alternative sites may not be as accurate as the fingers, because blood flow is slower in these areas. This means that the result you get may be delayed, and it may be different from the result that you would get from your finger.  The most common alternative sites are: ? Forearm. ? Thigh. ? Palm of the hand. Additional tips  Always keep your supplies with you.  If you have questions or need  help, all blood glucose meters have a 24-hour hotline number that you can call. You may also contact your health care provider.  After you use a few boxes of test strips, adjust (calibrate) your blood glucose meter by  following instructions that came with your meter. This information is not intended to replace advice given to you by your health care provider. Make sure you discuss any questions you have with your health care provider. Document Released: 06/13/2003 Document Revised: 12/29/2015 Document Reviewed: 11/20/2015 Elsevier Interactive Patient Education  2017 [employer].

## 2017-08-21 NOTE — Progress Notes (Signed)
Pt given discharge instructions, prescriptions, and care notes. Pt verbalized understanding AEB no further questions or concerns at this time. IV was discontinued, no redness, pain, or swelling noted at this time. Telemetry discontinued and Centralized Telemetry was notified. Pt left the floor via wheelchair with staff in stable condition. 

## 2017-08-21 NOTE — Progress Notes (Signed)
Inpatient Diabetes Program Recommendations  AACE/ADA: New Consensus Statement on Inpatient Glycemic Control (2015)  Target Ranges:  Prepandial:   less than 140 mg/dL      Peak postprandial:   less than 180 mg/dL (1-2 hours)      Critically ill patients:  140 - 180 mg/dL   Lab Results  Component Value Date   GLUCAP 100 (H) 08/21/2017   HGBA1C 11.2 (H) 06/01/2017    Review of Glycemic Control  Spoke with pt at length regarding his blood sugar control at home. Pt states he checks his blood sugars and takes 70/30 45 units in am and 40 units in pm. States he rarely has lows.  PCP is [facility]. Pt states [facility] 70[facility]30 [facility]. Discussed hypoglycemia s/s and treatment. Discussed importance of controlling blood sugars and f/u with PCP for management of diabetes. Discussed glycemic control as it relates to reducing risks for complications. Pt voiced understanding. Pt states he eats a lot better at home. Goes to mother's house for B, L, and D. Discussed HgbA1C of 11.2%. Needs to get better control - down to < 8%.  Inpatient Diabetes Program Recommendations:  FOR DISCHARGE:  70/30 35 units bid. Novolin R 0-9 units tidwc [facility].  Thank you. Ailene Ardshonda Camellia Popescu, RD, LDN, CDE Inpatient Diabetes Coordinator [phone]

## 2017-08-21 NOTE — Discharge Summary (Signed)
Discharge Summary  Rickey Smith ZOX:[id number] DOB: October 07, 1965  PCP: Marcine Matar, MD  Admit date: 08/17/2017 Discharge date: 08/21/2017  Time spent: 25 minutes  Recommendations for Outpatient Follow-up:  1. Follow up with PCP at [facility] on 08/25/17  2. Continue 70/30 35 units bid. Novolin R 0-9 units tidwc ($24.99 at [facility]) F/U with [facility] on 08/25/2017. 3. Right hepatic lobe lesion, echogenic-F/U with PCP   Discharge Diagnoses:  Active Hospital Problems   Diagnosis Date Noted  . DKA (diabetic ketoacidoses) (HCC) 08/17/2017    Resolved Hospital Problems  No resolved problems to display.    Discharge Condition: stable  Diet recommendation: diabetic diet  Vitals:   08/21/17 1213 08/21/17 1613  BP: 131/89 126/76  Pulse: 100 100  Resp: 16 16  Temp: 98.2 F (36.8 C) 98.5 F (36.9 C)  SpO2: 98% 97%    History of present illness:  52 year old male with Type one DM and polysubstance abuse   Presents to ED on 2/25 with AMS. Upon arrival patient severely encephalopathic. Glucose 1674, pH 6.8. Given 3L NS and started on insulin gtt. PCCM asked to admit.   After patient was able to wake up and reported to staff that he has had Flu like symptoms since Wednesday and then starting Friday had ABD pain with nausea/vomiting. Stated that he does cocaine weekly and last use was Friday.  Interim history  Patient was admitted to ICU for DKA.  [facility] assumed care on 08/19/2017.  On the day of discharge the patient was hemodynamically stable. Eating without any difficulty. He will need to follow up with PCP to monitor his diabetes.   Hospital Course:  Active Problems:   DKA (diabetic ketoacidoses) (HCC)  Diabetic ketoacidosis with hyperglycemia/Uncontrolled diabetes -On admission, patient did have a pH of 7.1 and Anion gap was unable to be calculated. Blood sugar 1674 -Patient required admission to ICU and required insulin drip -Patient transition to Levemir and insulin  sliding scale -Continue CBG monitoring -Last hemoglobin A1c 11.2 on 06/01/2017 -blood sugars have been controlled  Acute bronchitis -Currently on room air maintaining oxygen saturations in the high 90s -Respiratory viral panel negative -Completed azithromycin  Acute kidney injury  -Resolved -likely secondary to DKA and dehydration -Creatinine 1.82 on admission, currently 0.60  Abdominal pain/nausea, resolved -KUB obtained showing nonobstructive gas pattern -Right upper quadrant ultrasound shows  right hepatic lobe lesion, echogenic  Hyperkalemia/hyponatremia (pseudo) -Resolved -secondary to DKA  Acute metabolic encephalopathy, resolved -Appears to have resolved, Currently AAOx3 -suspect secondary to DKA versus substance abuse  Polysubstance abuse -UDS positive for cocaine  Coag negative Staph -1/2 blood cultures on 08/17/2017 showed SCN -Suspect contaminant   Procedures:  none  Consultations:  Diabetes coordinator  CCM  Discharge Exam: BP 126/76 (BP Location: Right Arm)   Pulse 100   Temp 98.5 F (36.9 C) (Oral)   Resp 16   Ht 6\' 1"  (1.854 m)   Wt 70.1 kg (154 lb 8.7 oz)   SpO2 97%   BMI 20.39 kg/m   General: 52 yo CM WD WN NAD A&O x 3  Cardiovascular: RRR no rubs or gallops Respiratory: CTA no wheezes or rales  Discharge Instructions You were cared for by a hospitalist during your hospital stay. If you have any questions about your discharge medications or the care you received while you were in the hospital after you are discharged, you can call the unit and asked to speak with the hospitalist on call if the hospitalist that  took care of you is not available. Once you are discharged, your primary care physician will handle any further medical issues. Please note that NO REFILLS for any discharge medications will be authorized once you are discharged, as it is imperative that you return to your primary care physician (or establish a relationship  with a primary care physician if you do not have one) for your aftercare needs so that they can reassess your need for medications and monitor your lab values.   Allergies as of 08/21/2017      Reactions   Codeine Itching   Gabapentin Diarrhea, Nausea And Vomiting      Medication List    TAKE these medications   acetaminophen 325 MG tablet Commonly known as:  TYLENOL Take 650 mg by mouth every 6 (six) hours as needed for mild pain.   amitriptyline 75 MG tablet Commonly known as:  ELAVIL Take 1 tablet (75 mg total) by mouth at bedtime.   insulin NPH-regular Human (70-30) 100 UNIT/ML injection Commonly known as:  NOVOLIN 70/30 70/30 35 units bid. Novolin R 0-9 units tidwc ($24.99 at [facility]) F/U with [facility] on 08/25/2017.      Allergies  Allergen Reactions  . Codeine Itching  . Gabapentin Diarrhea and Nausea And Vomiting   Follow-up Information    [facility] Follow up on 08/25/2017.   Why:  8:45 for hospital follow up Contact information: [address] [facility] [address] [phone]           The results of significant diagnostics from this hospitalization (including imaging, microbiology, ancillary and laboratory) are listed below for reference.    Significant Diagnostic Studies: Dg Abd 1 View  Result Date: 08/19/2017 CLINICAL DATA:  Abdominal pain for 3 days, some vomiting EXAM: ABDOMEN - 1 VIEW COMPARISON:  Abdomen films of 08/17/2017 FINDINGS: Both large and small bowel gas is present without distension. Air is seen to the rectum. No opaque calculi are noted. There are degenerative changes present in the lower lumbar spine. IMPRESSION: 1. Nonspecific bowel gas pattern.  No bowel obstruction. 2. No opaque calculi are noted. Electronically Signed   By: Dwyane Dee M.D.   On: 08/19/2017 09:05   Dg Chest Port 1 View  Result Date: 08/18/2017 CLINICAL DATA:  Vomiting, body aches, fever EXAM: PORTABLE CHEST 1 VIEW  COMPARISON:  12/27/2016 FINDINGS: Mild peribronchial thickening. Heart and mediastinal contours are within normal limits. No focal opacities or effusions. No acute bony abnormality. IMPRESSION: Mild bronchitic changes Electronically Signed   By: Charlett Nose M.D.   On: 08/18/2017 00:05   Dg Abd Portable 1v  Result Date: 08/18/2017 CLINICAL DATA:  Vomiting, body aches, fever EXAM: PORTABLE ABDOMEN - 1 VIEW COMPARISON:  04/23/2016 FINDINGS: Nonspecific bowel gas pattern with relative paucity of gas throughout the abdomen and pelvis. Gas only visualized within the right colon and proximal transverse colon. No free air organomegaly. No suspicious calcification. IMPRESSION: Nonspecific, nonobstructive bowel gas pattern with relative paucity of gas throughout the abdomen and pelvis as above. Electronically Signed   By: Charlett Nose M.D.   On: 08/18/2017 00:05   US Abdomen Limited Ruq  Result Date: 08/18/2017 CLINICAL DATA:  52 year old male with abdominal pain. EXAM: ULTRASOUND ABDOMEN LIMITED RIGHT UPPER QUADRANT COMPARISON:  Abdominal CT dated 02/11/2016 FINDINGS: Gallbladder: No gallstones or wall thickening visualized. No sonographic Murphy sign noted by sonographer. Common bile duct: Diameter: 4 mm Liver: There is a 1.1 x 1.0 x 1.2 cm  hypoechoic lesion in the right lobe of the liver which is not well characterized but may represent a hemangioma. Portal vein is patent on color Doppler imaging with normal direction of blood flow towards the liver. IMPRESSION: 1. Unremarkable gallbladder. 2. Small right hepatic lobe echogenic lesion, indeterminate, possibly a hemangioma. Electronically Signed   By: Elgie CollardArash  Radparvar M.D.   On: 08/18/2017 05:23    Microbiology: Recent Results (from the past 240 hour(s))  Culture, blood (routine x 2)     Status: Abnormal   Collection Time: 08/17/17 10:17 PM  Result Value Ref Range Status   Specimen Description BLOOD  Final   Special Requests   Final    RIGHT EJ BOTTLES  DRAWN AEROBIC ONLY Blood Culture adequate volume   Culture  Setup Time   Final    GRAM POSITIVE COCCI ANAEROBIC BOTTLE ONLY CRITICAL RESULT CALLED TO, READ BACK BY AND VERIFIED WITH: Sophronia SimasM BELL PHARMD 2019 08/18/17 A BROWNING    Culture (A)  Final    STAPHYLOCOCCUS SPECIES (COAGULASE NEGATIVE) THE SIGNIFICANCE OF ISOLATING THIS ORGANISM FROM A SINGLE SET OF BLOOD CULTURES WHEN MULTIPLE SETS ARE DRAWN IS UNCERTAIN. PLEASE NOTIFY THE MICROBIOLOGY DEPARTMENT WITHIN ONE WEEK IF SPECIATION AND SENSITIVITIES ARE REQUIRED. Performed at [facility], [address] [place], [place] [address]    Report Status 08/20/2017 FINAL  Final  Blood Culture ID Panel (Reflexed)     Status: Abnormal   Collection Time: 08/17/17 10:17 PM  Result Value Ref Range Status   Enterococcus species NOT DETECTED NOT DETECTED Final   Listeria monocytogenes NOT DETECTED NOT DETECTED Final   Staphylococcus species DETECTED (A) NOT DETECTED Final    Comment: Methicillin (oxacillin) susceptible coagulase negative staphylococcus. Possible blood culture contaminant (unless isolated from more than one blood culture draw or clinical case suggests pathogenicity). No antibiotic treatment is indicated for blood  culture contaminants. CRITICAL RESULT CALLED TO, READ BACK BY AND VERIFIED WITH: Sophronia SimasM BELL PHARMD 2019 08/18/17 A BROWNING    Staphylococcus aureus NOT DETECTED NOT DETECTED Final   Methicillin resistance NOT DETECTED NOT DETECTED Final   Streptococcus species NOT DETECTED NOT DETECTED Final   Streptococcus agalactiae NOT DETECTED NOT DETECTED Final   Streptococcus pneumoniae NOT DETECTED NOT DETECTED Final   Streptococcus pyogenes NOT DETECTED NOT DETECTED Final   Acinetobacter baumannii NOT DETECTED NOT DETECTED Final   Enterobacteriaceae species NOT DETECTED NOT DETECTED Final   Enterobacter cloacae complex NOT DETECTED NOT DETECTED Final   Escherichia coli NOT DETECTED NOT DETECTED Final   Klebsiella oxytoca NOT  DETECTED NOT DETECTED Final   Klebsiella pneumoniae NOT DETECTED NOT DETECTED Final   Proteus species NOT DETECTED NOT DETECTED Final   Serratia marcescens NOT DETECTED NOT DETECTED Final   Haemophilus influenzae NOT DETECTED NOT DETECTED Final   Neisseria meningitidis NOT DETECTED NOT DETECTED Final   Pseudomonas aeruginosa NOT DETECTED NOT DETECTED Final   Candida albicans NOT DETECTED NOT DETECTED Final   Candida glabrata NOT DETECTED NOT DETECTED Final   Candida krusei NOT DETECTED NOT DETECTED Final   Candida parapsilosis NOT DETECTED NOT DETECTED Final   Candida tropicalis NOT DETECTED NOT DETECTED Final    Comment: Performed at [facility], [address] [place], [place] [address]  Culture, blood (routine x 2)     Status: None (Preliminary result)   Collection Time: 08/17/17 10:35 PM  Result Value Ref Range Status   Specimen Description BLOOD LEFT FOREARM  Final   Special Requests  Final    BOTTLES DRAWN AEROBIC AND ANAEROBIC Blood Culture adequate volume   Culture   Final    NO GROWTH 4 DAYS Performed at [facility], [address], [place], [place] [address]    Report Status PENDING  Incomplete  Respiratory Panel by PCR     Status: None   Collection Time: 08/18/17 12:10 AM  Result Value Ref Range Status   Adenovirus NOT DETECTED NOT DETECTED Final   Coronavirus 229E NOT DETECTED NOT DETECTED Final   Coronavirus HKU1 NOT DETECTED NOT DETECTED Final   Coronavirus NL63 NOT DETECTED NOT DETECTED Final   Coronavirus OC43 NOT DETECTED NOT DETECTED Final   Metapneumovirus NOT DETECTED NOT DETECTED Final   Rhinovirus / Enterovirus NOT DETECTED NOT DETECTED Final   Influenza A NOT DETECTED NOT DETECTED Final   Influenza B NOT DETECTED NOT DETECTED Final   Parainfluenza Virus 1 NOT DETECTED NOT DETECTED Final   Parainfluenza Virus 2 NOT DETECTED NOT DETECTED Final   Parainfluenza Virus 3 NOT DETECTED NOT DETECTED Final   Parainfluenza Virus 4 NOT DETECTED NOT  DETECTED Final   Respiratory Syncytial Virus NOT DETECTED NOT DETECTED Final   Bordetella pertussis NOT DETECTED NOT DETECTED Final   Chlamydophila pneumoniae NOT DETECTED NOT DETECTED Final   Mycoplasma pneumoniae NOT DETECTED NOT DETECTED Final    Comment: Performed at [facility], [address], [place], [place] [address]  MRSA PCR Screening     Status: None   Collection Time: 08/18/17  1:15 AM  Result Value Ref Range Status   MRSA by PCR NEGATIVE NEGATIVE Final    Comment:        The GeneXpert MRSA Assay (FDA approved for NASAL specimens only), is one component of a comprehensive MRSA colonization surveillance program. It is not intended to diagnose MRSA infection nor to guide or monitor treatment for MRSA infections. Performed at [facility], [address], [place], [place] [address]      Labs: Basic Metabolic Panel: Recent Labs  Lab 08/18/17 0735 08/18/17 1659 08/19/17 0511 08/20/17 0431 08/21/17 0334  NA 132* 137 137 136 137  K 3.7 3.6 3.5 3.1* 4.1  CL 99* 106 104 102 103  CO2 18* 22 23 27 26   GLUCOSE 435* 172* 168* 94 66  BUN 46* 33* 16 8 7   CREATININE 1.61* 1.05 0.75 0.60* 0.64  CALCIUM 8.0* 7.8* 8.2* 8.0* 8.5*  MG 2.0  --  2.3 2.3 2.4  PHOS 1.3*  --  2.8 1.6* 2.5   Liver Function Tests: Recent Labs  Lab 08/17/17 2211 08/17/17 2328  AST 21 32  ALT 12* 17  ALKPHOS 115 131*  BILITOT 1.9* 1.9*  PROT 5.5* 5.7*  ALBUMIN 3.4* 3.3*   Recent Labs  Lab 08/18/17 0048  LIPASE 64*   No results for input(s): AMMONIA in the last 168 hours. CBC: Recent Labs  Lab 08/17/17 2211 08/17/17 2225 08/18/17 1250 08/19/17 0511 08/20/17 0431 08/21/17 0334  WBC 23.8*  --  16.3* 10.0 6.5 13.2*  NEUTROABS 19.8*  --  13.7* 8.3* 5.0 10.5*  HGB 11.8* 14.3 10.5* 10.4* 10.1* 11.0*  HCT 45.6 42.0 29.5* 29.7* 29.5* 33.7*  MCV 113.7*  --  80.6 81.6 86.0 87.5  PLT 307  --  184 134* 97* 137*   Cardiac Enzymes: Recent Labs  Lab 08/17/17 2345  CKTOTAL  135   BNP: BNP (last 3 results) No results for input(s): BNP in the last 8760 hours.  ProBNP (last 3 results) No results for input(s): PROBNP in the last 8760 hours.  CBG: Recent Labs  Lab 08/21/17 0403 08/21/17 0442 08/21/17 0742 08/21/17 1216 08/21/17 1610  GLUCAP 60* 150* 100* 241* 201*       Signed:  Darlin Drop, MD [facility] 08/21/2017, 4:59 PM

## 2017-08-22 LAB — CULTURE, BLOOD (ROUTINE X 2)
Culture: NO GROWTH
Special Requests: ADEQUATE

## 2017-08-25 ENCOUNTER — Ambulatory Visit: Payer: Self-pay | Admitting: Internal Medicine

## 2017-11-10 ENCOUNTER — Inpatient Hospital Stay (HOSPITAL_COMMUNITY)
Admission: EM | Admit: 2017-11-10 | Discharge: 2017-11-13 | DRG: 638 | Disposition: A | Discharge status: Home / Self Care | Payer: Self-pay | Attending: Internal Medicine | Admitting: Internal Medicine

## 2017-11-10 ENCOUNTER — Other Ambulatory Visit: Payer: Self-pay

## 2017-11-10 ENCOUNTER — Emergency Department (HOSPITAL_COMMUNITY): Payer: Self-pay

## 2017-11-10 DIAGNOSIS — E1310 Other specified diabetes mellitus with ketoacidosis without coma: Secondary | ICD-10-CM

## 2017-11-10 DIAGNOSIS — Z79899 Other long term (current) drug therapy: Secondary | ICD-10-CM

## 2017-11-10 DIAGNOSIS — Z87891 Personal history of nicotine dependence: Secondary | ICD-10-CM

## 2017-11-10 DIAGNOSIS — R000 Tachycardia, unspecified: Secondary | ICD-10-CM | POA: Diagnosis present

## 2017-11-10 DIAGNOSIS — E1165 Type 2 diabetes mellitus with hyperglycemia: Secondary | ICD-10-CM

## 2017-11-10 DIAGNOSIS — Z794 Long term (current) use of insulin: Secondary | ICD-10-CM

## 2017-11-10 DIAGNOSIS — E860 Dehydration: Secondary | ICD-10-CM | POA: Diagnosis present

## 2017-11-10 DIAGNOSIS — Z9079 Acquired absence of other genital organ(s): Secondary | ICD-10-CM

## 2017-11-10 DIAGNOSIS — Z885 Allergy status to narcotic agent status: Secondary | ICD-10-CM

## 2017-11-10 DIAGNOSIS — E871 Hypo-osmolality and hyponatremia: Secondary | ICD-10-CM | POA: Diagnosis present

## 2017-11-10 DIAGNOSIS — I493 Ventricular premature depolarization: Secondary | ICD-10-CM | POA: Diagnosis present

## 2017-11-10 DIAGNOSIS — E1142 Type 2 diabetes mellitus with diabetic polyneuropathy: Secondary | ICD-10-CM | POA: Diagnosis present

## 2017-11-10 DIAGNOSIS — Z888 Allergy status to other drugs, medicaments and biological substances status: Secondary | ICD-10-CM

## 2017-11-10 DIAGNOSIS — E1110 Type 2 diabetes mellitus with ketoacidosis without coma: Principal | ICD-10-CM | POA: Diagnosis present

## 2017-11-10 DIAGNOSIS — J209 Acute bronchitis, unspecified: Secondary | ICD-10-CM | POA: Diagnosis present

## 2017-11-10 DIAGNOSIS — E875 Hyperkalemia: Secondary | ICD-10-CM | POA: Diagnosis present

## 2017-11-10 DIAGNOSIS — E1010 Type 1 diabetes mellitus with ketoacidosis without coma: Secondary | ICD-10-CM

## 2017-11-10 DIAGNOSIS — Z880 Allergy status to penicillin: Secondary | ICD-10-CM

## 2017-11-10 DIAGNOSIS — N179 Acute kidney failure, unspecified: Secondary | ICD-10-CM | POA: Diagnosis present

## 2017-11-10 DIAGNOSIS — F1910 Other psychoactive substance abuse, uncomplicated: Secondary | ICD-10-CM | POA: Diagnosis present

## 2017-11-10 HISTORY — DX: Type 2 diabetes mellitus without complications: E11.9

## 2017-11-10 HISTORY — DX: Type 2 diabetes mellitus with diabetic polyneuropathy: E11.42

## 2017-11-10 LAB — URINALYSIS, ROUTINE W REFLEX MICROSCOPIC
BILIRUBIN URINE: NEGATIVE
Bacteria, UA: NONE SEEN
Hgb urine dipstick: NEGATIVE
KETONES UR: 80 mg/dL — AB
LEUKOCYTES UA: NEGATIVE
NITRITE: NEGATIVE
PH: 5 (ref 5.0–8.0)
PROTEIN: 30 mg/dL — AB
Specific Gravity, Urine: 1.028 (ref 1.005–1.030)

## 2017-11-10 LAB — CBC WITH DIFFERENTIAL/PLATELET
Abs Immature Granulocytes: 0.1 10*3/uL (ref 0.0–0.1)
BASOS PCT: 0 %
Basophils Absolute: 0 10*3/uL (ref 0.0–0.1)
EOS ABS: 0 10*3/uL (ref 0.0–0.7)
Eosinophils Relative: 0 %
HCT: 44.4 % (ref 39.0–52.0)
Hemoglobin: 14.2 g/dL (ref 13.0–17.0)
IMMATURE GRANULOCYTES: 1 %
Lymphocytes Relative: 11 %
Lymphs Abs: 1.4 10*3/uL (ref 0.7–4.0)
MCH: 26.1 pg (ref 26.0–34.0)
MCHC: 32 g/dL (ref 30.0–36.0)
MCV: 81.6 fL (ref 78.0–100.0)
MONO ABS: 0.3 10*3/uL (ref 0.1–1.0)
MONOS PCT: 2 %
NEUTROS PCT: 86 %
Neutro Abs: 10.9 10*3/uL — ABNORMAL HIGH (ref 1.7–7.7)
PLATELETS: 384 10*3/uL (ref 150–400)
RBC: 5.44 MIL/uL (ref 4.22–5.81)
RDW: 13 % (ref 11.5–15.5)
WBC: 12.7 10*3/uL — ABNORMAL HIGH (ref 4.0–10.5)

## 2017-11-10 LAB — COMPREHENSIVE METABOLIC PANEL
ALT: 9 U/L — ABNORMAL LOW (ref 17–63)
ALT: 9 U/L — ABNORMAL LOW (ref 17–63)
ANION GAP: 17 — AB (ref 5–15)
ANION GAP: 15 (ref 5–15)
AST: 18 U/L (ref 15–41)
AST: 13 U/L — AB (ref 15–41)
Albumin: 3.5 g/dL (ref 3.5–5.0)
Albumin: 2.9 g/dL — ABNORMAL LOW (ref 3.5–5.0)
Alkaline Phosphatase: 91 U/L (ref 38–126)
Alkaline Phosphatase: 74 U/L (ref 38–126)
BILIRUBIN TOTAL: 1 mg/dL (ref 0.3–1.2)
BUN: 16 mg/dL (ref 6–20)
BUN: 15 mg/dL (ref 6–20)
CHLORIDE: 101 mmol/L (ref 101–111)
CHLORIDE: 104 mmol/L (ref 101–111)
CO2: 15 mmol/L — ABNORMAL LOW (ref 22–32)
CO2: 14 mmol/L — ABNORMAL LOW (ref 22–32)
Calcium: 9 mg/dL (ref 8.9–10.3)
Calcium: 7.7 mg/dL — ABNORMAL LOW (ref 8.9–10.3)
Creatinine, Ser: 1.31 mg/dL — ABNORMAL HIGH (ref 0.61–1.24)
Creatinine, Ser: 1.17 mg/dL (ref 0.61–1.24)
GFR calc Af Amer: >60 mL/min (ref >60)
Glucose, Bld: 282 mg/dL — ABNORMAL HIGH (ref 65–99)
Glucose, Bld: 287 mg/dL — ABNORMAL HIGH (ref 65–99)
POTASSIUM: 5.7 mmol/L — AB (ref 3.5–5.1)
Potassium: 6.2 mmol/L — ABNORMAL HIGH (ref 3.5–5.1)
Sodium: 133 mmol/L — ABNORMAL LOW (ref 135–145)
Sodium: 133 mmol/L — ABNORMAL LOW (ref 135–145)
TOTAL PROTEIN: 5.5 g/dL — AB (ref 6.5–8.1)
Total Bilirubin: 1.4 mg/dL — ABNORMAL HIGH (ref 0.3–1.2)
Total Protein: 6.9 g/dL (ref 6.5–8.1)

## 2017-11-10 LAB — I-STAT CHEM 8, ED
BUN: 21 mg/dL — AB (ref 6–20)
BUN: 14 mg/dL (ref 6–20)
CHLORIDE: 107 mmol/L (ref 101–111)
CREATININE: 0.7 mg/dL (ref 0.61–1.24)
Calcium, Ion: 1.12 mmol/L — ABNORMAL LOW (ref 1.15–1.40)
Calcium, Ion: 1.07 mmol/L — ABNORMAL LOW (ref 1.15–1.40)
Chloride: 105 mmol/L (ref 101–111)
Creatinine, Ser: 0.7 mg/dL (ref 0.61–1.24)
GLUCOSE: 443 mg/dL — AB (ref 65–99)
Glucose, Bld: 281 mg/dL — ABNORMAL HIGH (ref 65–99)
HCT: 39 % (ref 39.0–52.0)
HCT: 36 % — ABNORMAL LOW (ref 39.0–52.0)
HEMOGLOBIN: 12.2 g/dL — AB (ref 13.0–17.0)
Hemoglobin: 13.3 g/dL (ref 13.0–17.0)
POTASSIUM: 5.6 mmol/L — AB (ref 3.5–5.1)
POTASSIUM: 4.9 mmol/L (ref 3.5–5.1)
SODIUM: 134 mmol/L — AB (ref 135–145)
Sodium: 132 mmol/L — ABNORMAL LOW (ref 135–145)
TCO2: 16 mmol/L — ABNORMAL LOW (ref 22–32)
TCO2: 13 mmol/L — ABNORMAL LOW (ref 22–32)

## 2017-11-10 LAB — CBG MONITORING, ED: GLUCOSE-CAPILLARY: 278 mg/dL — AB (ref 65–99)

## 2017-11-10 LAB — CK: CK TOTAL: 56 U/L (ref 49–397)

## 2017-11-10 LAB — LIPASE, BLOOD: LIPASE: 21 U/L (ref 11–51)

## 2017-11-10 MED ORDER — DIPHENHYDRAMINE HCL 50 MG/ML IJ SOLN
25.0000 mg | Freq: Once | INTRAMUSCULAR | Status: AC
  Administered 2017-11-10: 25 mg via INTRAVENOUS
  Filled 2017-11-10: qty 1

## 2017-11-10 MED ORDER — IPRATROPIUM-ALBUTEROL 0.5-2.5 (3) MG/3ML IN SOLN
3.0000 mL | Freq: Once | RESPIRATORY_TRACT | Status: AC
  Administered 2017-11-10: 3 mL via RESPIRATORY_TRACT
  Filled 2017-11-10: qty 3

## 2017-11-10 MED ORDER — INSULIN REGULAR HUMAN 100 UNIT/ML IJ SOLN
INTRAMUSCULAR | Status: DC
  Administered 2017-11-11: 3.3 [IU]/h via INTRAVENOUS
  Filled 2017-11-10: qty 1

## 2017-11-10 MED ORDER — FAMOTIDINE IN NACL 20-0.9 MG/50ML-% IV SOLN
20.0000 mg | Freq: Two times a day (BID) | INTRAVENOUS | Status: DC
  Administered 2017-11-11 (×2): 20 mg via INTRAVENOUS
  Filled 2017-11-10 (×2): qty 50

## 2017-11-10 MED ORDER — MORPHINE SULFATE (PF) 4 MG/ML IV SOLN: 2.0000 mg | INTRAVENOUS | Status: DC | PRN

## 2017-11-10 MED ORDER — ENOXAPARIN SODIUM 40 MG/0.4ML ~~LOC~~ SOLN
40.0000 mg | SUBCUTANEOUS | Status: DC
  Administered 2017-11-11 – 2017-11-12 (×2): 40 mg via SUBCUTANEOUS
  Filled 2017-11-10 (×3): qty 0.4

## 2017-11-10 MED ORDER — ONDANSETRON HCL 4 MG/2ML IJ SOLN
4.0000 mg | Freq: Once | INTRAMUSCULAR | Status: AC
  Administered 2017-11-10: 4 mg via INTRAVENOUS
  Filled 2017-11-10: qty 2

## 2017-11-10 MED ORDER — DEXTROSE-NACL 5-0.45 % IV SOLN
INTRAVENOUS | Status: DC
  Administered 2017-11-11: 04:00:00 via INTRAVENOUS

## 2017-11-10 MED ORDER — SODIUM CHLORIDE 0.9 % IV BOLUS
1000.0000 mL | Freq: Once | INTRAVENOUS | Status: AC
Start: 2017-11-10 — End: 2017-11-10
  Administered 2017-11-10: 1000 mL via INTRAVENOUS

## 2017-11-10 MED ORDER — SODIUM CHLORIDE 0.9 % IV BOLUS
1000.0000 mL | Freq: Once | INTRAVENOUS | Status: AC
  Administered 2017-11-10: 1000 mL via INTRAVENOUS

## 2017-11-10 MED ORDER — SODIUM CHLORIDE 0.9 % IV SOLN
INTRAVENOUS | Status: DC
  Administered 2017-11-11: via INTRAVENOUS

## 2017-11-10 MED ORDER — DOXYCYCLINE HYCLATE 100 MG PO TABS
100.0000 mg | ORAL_TABLET | Freq: Two times a day (BID) | ORAL | Status: DC
  Administered 2017-11-11 – 2017-11-13 (×6): 100 mg via ORAL
  Filled 2017-11-10 (×6): qty 1

## 2017-11-10 MED ORDER — METOCLOPRAMIDE HCL 5 MG/ML IJ SOLN
10.0000 mg | Freq: Once | INTRAMUSCULAR | Status: AC
  Administered 2017-11-10: 10 mg via INTRAVENOUS
  Filled 2017-11-10: qty 2

## 2017-11-10 MED ORDER — DEXTROSE-NACL 5-0.45 % IV SOLN: INTRAVENOUS | Status: DC

## 2017-11-10 MED ORDER — AMITRIPTYLINE HCL 50 MG PO TABS
75.0000 mg | ORAL_TABLET | Freq: Every day | ORAL | Status: DC
  Administered 2017-11-11 – 2017-11-12 (×3): 75 mg via ORAL
  Filled 2017-11-10: qty 1
  Filled 2017-11-10: qty 3
  Filled 2017-11-10: qty 1

## 2017-11-10 MED ORDER — SODIUM CHLORIDE 0.9 % IV SOLN
INTRAVENOUS | Status: DC
  Filled 2017-11-10: qty 1

## 2017-11-10 MED ORDER — ONDANSETRON HCL 4 MG/2ML IJ SOLN: 4.0000 mg | Freq: Four times a day (QID) | INTRAMUSCULAR | Status: DC | PRN

## 2017-11-10 MED ORDER — KETOROLAC TROMETHAMINE 30 MG/ML IJ SOLN
30.0000 mg | Freq: Once | INTRAMUSCULAR | Status: AC
  Administered 2017-11-10: 30 mg via INTRAVENOUS
  Filled 2017-11-10: qty 1

## 2017-11-10 MED ORDER — ALBUTEROL SULFATE (2.5 MG/3ML) 0.083% IN NEBU: 2.5000 mg | INHALATION_SOLUTION | Freq: Four times a day (QID) | RESPIRATORY_TRACT | Status: DC | PRN

## 2017-11-10 MED ORDER — MORPHINE SULFATE (PF) 4 MG/ML IV SOLN
4.0000 mg | Freq: Once | INTRAVENOUS | Status: AC
  Administered 2017-11-10: 4 mg via INTRAVENOUS
  Filled 2017-11-10: qty 1

## 2017-11-10 NOTE — ED Notes (Signed)
Pt reports " feeling better at this time " denies any further n/v and reports a decrease in headache

## 2017-11-10 NOTE — ED Triage Notes (Signed)
Pt c/o n/v since yesterday ; pt states " I am unable to keep anything down since yesterday and I feel really weak today "  Per EMS , pt was tachy in the 130's and  BP was 90/60 upon arrival; currently being treated for bronchitis

## 2017-11-10 NOTE — ED Notes (Signed)
Pt going to xray  

## 2017-11-10 NOTE — ED Notes (Signed)
Provider informed RN and pt that he would need to be admitted.  Provider also agreed that pt could drink.  Gave a diet coke per his request.

## 2017-11-10 NOTE — H&P (Signed)
History and Physical    Rickey Smith AVW:[id number] DOB: 08/02/1965 DOA: 11/10/2017  PCP: Marcine Matar, MD   Patient coming from: Home  Chief Complaint: Malaise, nausea, vomiting   HPI: Rickey Smith is a 52 y.o. male with medical history significant for substance abuse and insulin-dependent diabetes mellitus, now presenting to the emergency department with malaise, nausea, and nonbloody vomiting.  Patient reports that he had been short of breath and coughing last week, was diagnosed with bronchitis, and started on doxycycline and prednisone.  His respiratory symptoms have improved drastically but he developed a general malaise yesterday with nausea and nonbloody vomiting.  He reports mild abdominal discomfort, but no pain per se.  Denies any melena or hematochezia.  Reports continued adherence with his insulin regimen.  Denies fevers or chills.  He called EMS and was found to be tachycardic in the 130s with blood pressure of 90/60.  ED Course: Upon arrival to the ED, patient is found to be afebrile, saturating well on room air, tachypneic, tachycardic, and with stable blood pressure.  EKG features a sinus tachycardia with rate 124, PVCs, and LVH.  Chest x-ray is negative for acute cardiopulmonary disease.  Chemistry panel is notable for mild hyponatremia, potassium 6.2, bicarbonate 15, glucose 282, and creatinine 1.31, up from an apparent baseline of 0.6.  CBC is notable for leukocytosis to 12,700.  Urinalysis features glucosuria and ketonuria.  Patient was given 3 L normal saline and started on insulin infusion.  He will be admitted to the stepdown unit for ongoing evaluation and management of diabetic ketoacidosis.  Review of Systems:  All other systems reviewed and apart from HPI, are negative.  Past Medical History:  Diagnosis Date  . Diabetes mellitus without complication (HCC)   . Migraine   . Neuropathy     Past Surgical History:  Procedure Laterality Date  .  TRANSURETHRAL RESECTION OF PROSTATE N/A 08/16/2015   Procedure: TRANSURETHRAL RESECTION DRAINAGE OF PROSTATE ABCESS;  Surgeon: Barron Alvine, MD;  Location: [facility];  Service: Urology;  Laterality: N/A;     reports that he quit smoking about 8 months ago. His smoking use included cigarettes. He has a 10.00 pack-year smoking history. He has never used smokeless tobacco. He reports that he has current or past drug history. Drugs: Cocaine and Heroin. He reports that he does not drink alcohol.  Allergies  Allergen Reactions  . Gabapentin Diarrhea and Nausea And Vomiting    Had to be hospitalized because of the reaction(s)  . Codeine Itching  . Penicillins Hives and Rash    Has patient had a PCN reaction causing immediate rash, facial/tongue/throat swelling, SOB or lightheadedness with hypotension: Yes Has patient had a PCN reaction causing severe rash involving mucus membranes or skin necrosis: Unk Has patient had a PCN reaction that required hospitalization: Unk Has patient had a PCN reaction occurring within the last 10 years: Yes If all of the above answers are "NO", then may proceed with Cephalosporin use.     Family History  Problem Relation Age of Onset  . Heart attack Unknown   . Prostate cancer Unknown   . Hypertension Unknown   . Hypertension Mother   . Cancer Father      Prior to Admission medications   Medication Sig Start Date End Date Taking? Authorizing Provider  acetaminophen (TYLENOL) 325 MG tablet Take 650 mg by mouth every 6 (six) hours as needed for mild pain.   Yes [provider]  amitriptyline (ELAVIL)  75 MG tablet Take 1 tablet (75 mg total) by mouth at bedtime. 04/16/17  Yes Danelle Earthly, Tiffany S, PA-C  doxycycline (VIBRAMYCIN) 100 MG capsule Take 100 mg by mouth 2 (two) times daily. FOR 10 DAYS 11/06/17 11/16/17 Yes [provider]  insulin NPH-regular Human (NOVOLIN 70/30) (70-30) 100 UNIT/ML injection 70/30 35 units bid. Novolin R 0-9 units tidwc  ($24.99 at [facility]) F/U with [facility] on 08/25/2017.  Patient taking differently: Inject 40-45 Units into the skin See admin instructions. Inject 45 units into the skin before breakfast and 40 units before dinner/evening meal  08/21/17  Yes Hall, Carole N, DO  predniSONE (DELTASONE) 10 MG tablet Take 10-40 mg by mouth See admin instructions. Take 40 mg by mouth once a day for 2 days then 30 mg once a day for 2 days then 20 mg once a day for 2 days then 10 mg once a day for 2 days 11/06/17 11/13/17 Yes [provider]    Physical Exam: Vitals:   11/10/17 1930 11/10/17 2000 11/10/17 2030 11/10/17 2133  BP: (!) 150/85 (!) 153/94 (!) 151/89 (!) 144/80  Pulse: (!) 109 (!) 108 (!) 107 (!) 108  Resp: (!) 24 (!) 23 (!) 25 (!) 22  Temp:    98.7 F (37.1 C)  TempSrc:    Oral  SpO2: 99% 99% 99% 100%  Weight:      Height:          Constitutional: NAD, calm, appears uncomfortable Eyes: PERTLA, lids and conjunctivae normal ENMT: Mucous membranes are moist. Posterior pharynx clear of any exudate or lesions.   Neck: normal, supple, no masses, no thyromegaly Respiratory: clear to auscultation bilaterally, no wheezing, no crackles. Normal respiratory effort.   Cardiovascular: Rate ~110 and regular. No extremity edema. No significant JVD. Abdomen: No distension, no tenderness, soft. Bowel sounds active.  Musculoskeletal: no clubbing / cyanosis. No joint deformity upper and lower extremities.  Skin: no significant rashes, lesions, ulcers. Poor turgor. Neurologic: CN 2-12 grossly intact. Sensation intact. Strength 5/5 in all 4 limbs.  Psychiatric: Alert and oriented x 3. Calm, cooperative.     Labs on Admission: I have personally reviewed following labs and imaging studies  CBC: Recent Labs  Lab 11/10/17 1643 11/10/17 1855 11/10/17 2320  WBC 12.7*  --   --   NEUTROABS 10.9*  --   --   HGB 14.2 13.3 12.2*  HCT 44.4 39.0 36.0*  MCV 81.6  --   --   PLT 384  --   --    Basic Metabolic  Panel: Recent Labs  Lab 11/10/17 1643 11/10/17 1823 11/10/17 1855 11/10/17 2320  NA 133* 133* 134* 132*  K 6.2* 5.7* 5.6* 4.9  CL 101 104 107 105  CO2 15* 14*  --   --   GLUCOSE 282* 287* 281* 443*  BUN 16 15 21* 14  CREATININE 1.31* 1.17 0.70 0.70  CALCIUM 9.0 7.7*  --   --    GFR: Estimated Creatinine Clearance: 115.6 mL/min (by C-G formula based on SCr of 0.7 mg/dL). Liver Function Tests: Recent Labs  Lab 11/10/17 1643 11/10/17 1823  AST 18 13*  ALT 9* 9*  ALKPHOS 91 74  BILITOT 1.4* 1.0  PROT 6.9 5.5*  ALBUMIN 3.5 2.9*   Recent Labs  Lab 11/10/17 1643  LIPASE 21   No results for input(s): AMMONIA in the last 168 hours. Coagulation Profile: No results for input(s): INR, PROTIME in the last 168 hours. Cardiac Enzymes: Recent Labs  Lab 11/10/17 2047  CKTOTAL 56   BNP (last 3 results) No results for input(s): PROBNP in the last 8760 hours. HbA1C: No results for input(s): HGBA1C in the last 72 hours. CBG: Recent Labs  Lab 11/10/17 1623  GLUCAP 278*   Lipid Profile: No results for input(s): CHOL, HDL, LDLCALC, TRIG, CHOLHDL, LDLDIRECT in the last 72 hours. Thyroid Function Tests: No results for input(s): TSH, T4TOTAL, FREET4, T3FREE, THYROIDAB in the last 72 hours. Anemia Panel: No results for input(s): VITAMINB12, FOLATE, FERRITIN, TIBC, IRON, RETICCTPCT in the last 72 hours. Urine analysis:    Component Value Date/Time   COLORURINE YELLOW 11/10/2017 1800   APPEARANCEUR CLEAR 11/10/2017 1800   LABSPEC 1.028 11/10/2017 1800   PHURINE 5.0 11/10/2017 1800   GLUCOSEU >=500 (A) 11/10/2017 1800   HGBUR NEGATIVE 11/10/2017 1800   BILIRUBINUR NEGATIVE 11/10/2017 1800   BILIRUBINUR neg 08/10/2015 1041   KETONESUR 80 (A) 11/10/2017 1800   PROTEINUR 30 (A) 11/10/2017 1800   UROBILINOGEN 2.0 08/10/2015 1041   UROBILINOGEN 0.2 10/23/2014 0817   NITRITE NEGATIVE 11/10/2017 1800   LEUKOCYTESUR NEGATIVE 11/10/2017 1800   Sepsis  Labs: (procalcitonin:4,lacticidven:4) )No results found for this or any previous visit (from the past 240 hour(s)).   Radiological Exams on Admission: Dg Chest 2 View  Result Date: 11/10/2017 CLINICAL DATA:  Cough EXAM: CHEST - 2 VIEW COMPARISON:  08/17/2017 FINDINGS: Hyperinflation. Scarring in the left upper lobe. No acute opacity or pleural effusion. Normal heart size. No pneumothorax. IMPRESSION: No active cardiopulmonary disease. Electronically Signed   By: Jasmine Pang M.D.   On: 11/10/2017 18:14    EKG: Independently reviewed. Sinus tachycardia (rate 124), PVC, LVH.   Assessment/Plan  1. DKA; insulin-dependent DM  - Presents with malaise, nausea, and non-bloody vomiting  - Found to have hyperglycemia with metabolic acidosis and urine ketones  - Hx of IDDM with A1c 11.2% last year, managed with Novolin 70/30 45 units qAM and 40 units qPM at home - Fluid-resuscitated with 3 liters NS in ED and started on insulin infusion  - Continue insulin infusion with frequent CBG's and serial chem panels   2. Bronchitis  - Started on doxycycline and prednisone last week for bronchitis  - No dyspnea or coughing on admission  - Stop prednisone, continue doxycycline, prn nebs    3. Hyperkalemia  - Serum potassium is 6.2 on admission without significant EKG change  - Likely secondary to DKA, anticipate resolution with insulin infusion  - Continue cardiac monitoring, follow serial chem panels    4. AKI  - SCr is 1.31 on admission, up from apparent baseline of 0.6  - Likely prerenal and secondary to DKA with osmotic diuresis and N/V  - Fluid-resuscitated in ED with 3 liters NS  - Continue IVF hydration, renally-dose medications, avoid nephrotoxins, repeat chem panel     DVT prophylaxis: Lovenox Code Status: Full  Family Communication: Discussed with patient Consults called: None Admission status: Inpatient    Briscoe Deutscher, MD [facility] Pager [phone]  If  7PM-7AM, please contact night-coverage [email or web address] Password [facility]  11/10/2017, 11:39 PM

## 2017-11-10 NOTE — ED Notes (Signed)
Jenifer, rn sbar given

## 2017-11-10 NOTE — ED Provider Notes (Signed)
[facility] EMERGENCY DEPARTMENT Provider Note   CSN: [id number] Arrival date & time: 11/10/17  1613     History   Chief Complaint Chief Complaint  Patient presents with  . Emesis    HPI Rickey Smith is a 52 y.o. male.  Level 5 caveat for urgent need for intervention.  Type I diabetic presents with upper respiratory symptoms since past Thursday followed by nausea and vomiting the past 24 hours.  He feels dehydrated.  He has a history of DKA.  He was initially hypotensive and tachycardic via EMS     Past Medical History:  Diagnosis Date  . Diabetes mellitus without complication (HCC)   . Migraine   . Neuropathy     Patient Active Problem List   Diagnosis Date Noted  . DKA (diabetic ketoacidoses) (HCC) 08/17/2017  . Hyperlipidemia 06/26/2017  . Uncontrolled type 2 diabetes mellitus with hyperglycemia, with long-term current use of insulin (HCC) 12/28/2016  . Cocaine use 12/28/2016  . Polysubstance abuse (HCC) 04/30/2016  . Diabetic neuropathy (HCC) 08/10/2015  . Tachycardia 08/02/2015  . GERD (gastroesophageal reflux disease) 10/26/2014  . Ex-smoker 04/26/2014    Past Surgical History:  Procedure Laterality Date  . TRANSURETHRAL RESECTION OF PROSTATE N/A 08/16/2015   Procedure: TRANSURETHRAL RESECTION DRAINAGE OF PROSTATE ABCESS;  Surgeon: Barron Alvine, MD;  Location: [facility];  Service: Urology;  Laterality: N/A;        Home Medications    Prior to Admission medications   Medication Sig Start Date End Date Taking? Authorizing Provider  acetaminophen (TYLENOL) 325 MG tablet Take 650 mg by mouth every 6 (six) hours as needed for mild pain.   Yes [provider]  amitriptyline (ELAVIL) 75 MG tablet Take 1 tablet (75 mg total) by mouth at bedtime. 04/16/17  Yes Danelle Earthly, Tiffany S, PA-C  doxycycline (VIBRAMYCIN) 100 MG capsule Take 100 mg by mouth 2 (two) times daily. FOR 10 DAYS 11/06/17 11/16/17 Yes [provider]  insulin  NPH-regular Human (NOVOLIN 70/30) (70-30) 100 UNIT/ML injection 70/30 35 units bid. Novolin R 0-9 units tidwc ($24.99 at [facility]) F/U with [facility] on 08/25/2017.  Patient taking differently: Inject 40-45 Units into the skin See admin instructions. Inject 45 units into the skin before breakfast and 40 units before dinner/evening meal  08/21/17  Yes Hall, Carole N, DO  predniSONE (DELTASONE) 10 MG tablet Take 10-40 mg by mouth See admin instructions. Take 40 mg by mouth once a day for 2 days then 30 mg once a day for 2 days then 20 mg once a day for 2 days then 10 mg once a day for 2 days 11/06/17 11/13/17 Yes [provider]    Family History Family History  Problem Relation Age of Onset  . Heart attack Unknown   . Prostate cancer Unknown   . Hypertension Unknown   . Hypertension Mother   . Cancer Father     Social History Social History   Tobacco Use  . Smoking status: Former Smoker    Packs/day: 0.50    Years: 20.00    Pack years: 10.00    Types: Cigarettes    Last attempt to quit: 02/2017    Years since quitting: 0.7  . Smokeless tobacco: Never Used  Substance Use Topics  . Alcohol use: No  . Drug use: Yes    Types: Cocaine, Heroin     Allergies   Gabapentin; Codeine; and Penicillins   Review of Systems Review of Systems  Unable  to perform ROS: Acuity of condition     Physical Exam Updated Vital Signs BP (!) 144/80 (BP Location: Left Arm)   Pulse (!) 108   Temp 98.7 F (37.1 C) (Oral)   Resp (!) 22   Ht  (1.854 m)   Wt 74.8 kg (165 lb)   SpO2 100%   BMI 21.77 kg/m   Physical Exam  Constitutional: He is oriented to person, place, and time.  Dehydrated, tachycardic  HENT:  Head: Normocephalic and atraumatic.  Eyes: Conjunctivae are normal.  Neck: Neck supple.  Cardiovascular: Regular rhythm.  tachy  Pulmonary/Chest: Effort normal and breath sounds normal.  Abdominal: Soft. Bowel sounds are normal.  Musculoskeletal: Normal range of  motion.  Neurological: He is alert and oriented to person, place, and time.  Skin: Skin is warm and dry.  Psychiatric: He has a normal mood and affect. His behavior is normal.  Nursing note and vitals reviewed.    ED Treatments / Results  Labs (all labs ordered are listed, but only abnormal results are displayed) Labs Reviewed  CBC WITH DIFFERENTIAL/PLATELET - Abnormal; Notable for the following components:      Result Value   WBC 12.7 (*)    Neutro Abs 10.9 (*)    All other components within normal limits  COMPREHENSIVE METABOLIC PANEL - Abnormal; Notable for the following components:   Sodium 133 (*)    Potassium 6.2 (*)    CO2 15 (*)    Glucose, Bld 282 (*)    Creatinine, Ser 1.31 (*)    ALT 9 (*)    Total Bilirubin 1.4 (*)    Anion gap 17 (*)    All other components within normal limits  URINALYSIS, ROUTINE W REFLEX MICROSCOPIC - Abnormal; Notable for the following components:   Glucose, UA >=500 (*)    Ketones, ur 80 (*)    Protein, ur 30 (*)    All other components within normal limits  COMPREHENSIVE METABOLIC PANEL - Abnormal; Notable for the following components:   Sodium 133 (*)    Potassium 5.7 (*)    CO2 14 (*)    Glucose, Bld 287 (*)    Calcium 7.7 (*)    Total Protein 5.5 (*)    Albumin 2.9 (*)    AST 13 (*)    ALT 9 (*)    All other components within normal limits  CBG MONITORING, ED - Abnormal; Notable for the following components:   Glucose-Capillary 278 (*)    All other components within normal limits  I-STAT CHEM 8, ED - Abnormal; Notable for the following components:   Sodium 134 (*)    Potassium 5.6 (*)    BUN 21 (*)    Glucose, Bld 281 (*)    Calcium, Ion 1.12 (*)    TCO2 16 (*)    All other components within normal limits  LIPASE, BLOOD  CK  I-STAT CHEM 8, ED    EKG EKG Interpretation  Date/Time:  Monday Nov 10 2017 16:17:29 EDT Ventricular Rate:  124 PR Interval:    QRS Duration: 80 QT Interval:  291 QTC Calculation: 418 R  Axis:   75 Text Interpretation:  Sinus tachycardia Multiform ventricular premature complexes Aberrant complex LAE, consider biatrial enlargement Probable left ventricular hypertrophy Confirmed by Donnetta Hutching ([id number]) on 11/10/2017 5:53:35 PM   Radiology Dg Chest 2 View  Result Date: 11/10/2017 CLINICAL DATA:  Cough EXAM: CHEST - 2 VIEW COMPARISON:  08/17/2017 FINDINGS: Hyperinflation. Scarring in the  left upper lobe. No acute opacity or pleural effusion. Normal heart size. No pneumothorax. IMPRESSION: No active cardiopulmonary disease. Electronically Signed   By: Jasmine Pang M.D.   On: 11/10/2017 18:14    Procedures Procedures (including critical care time)  Medications Ordered in ED Medications  dextrose 5 %-0.45 % sodium chloride infusion (has no administration in time range)  insulin regular (NOVOLIN R,HUMULIN R) 100 Units in sodium chloride 0.9 % 100 mL (1 Units/mL) infusion (has no administration in time range)  sodium chloride 0.9 % bolus 1,000 mL (0 mLs Intravenous Stopped 11/10/17 1737)  ondansetron (ZOFRAN) injection 4 mg (4 mg Intravenous Given 11/10/17 1637)  sodium chloride 0.9 % bolus 1,000 mL (0 mLs Intravenous Stopped 11/10/17 1737)  metoCLOPramide (REGLAN) injection 10 mg (10 mg Intravenous Given 11/10/17 1642)  ketorolac (TORADOL) 30 MG/ML injection 30 mg (30 mg Intravenous Given 11/10/17 1642)  diphenhydrAMINE (BENADRYL) injection 25 mg (25 mg Intravenous Given 11/10/17 1641)  ipratropium-albuterol (DUONEB) 0.5-2.5 (3) MG/3ML nebulizer solution 3 mL (3 mLs Nebulization Given 11/10/17 1642)  sodium chloride 0.9 % bolus 1,000 mL (0 mLs Intravenous Stopped 11/10/17 2014)  morphine 4 MG/ML injection 4 mg (4 mg Intravenous Given 11/10/17 2049)     Initial Impression / Assessment and Plan / ED Course  I have reviewed the triage vital signs and the nursing notes.  Pertinent labs & imaging results that were available during my care of the patient were reviewed by me and considered in  my medical decision making (see chart for details).    Patient presents with bronchitic symptoms followed by nausea and vomiting.  He is a type I diabetic.  Glucose 281, CO2 16, potassium 5.7.  Will hydrate and initiate glucose stabilizer protocol.  Admit to general medicine.   CRITICAL CARE Performed by: Donnetta Hutching  ?  Total critical care time: 30 minutes  Critical care time was exclusive of separately billable procedures and treating other patients.  Critical care was necessary to treat or prevent imminent or life-threatening deterioration.  Critical care was time spent personally by me on the following activities: development of treatment plan with patient and/or surrogate as well as nursing, discussions with consultants, evaluation of patient's response to treatment, examination of patient, obtaining history from patient or surrogate, ordering and performing treatments and interventions, ordering and review of laboratory studies, ordering and review of radiographic studies, pulse oximetry and re-evaluation of patient's condition.  Final Clinical Impressions(s) / ED Diagnoses   Final diagnoses:  Dehydration  Diabetic ketoacidosis without coma associated with type 1 diabetes mellitus [facility])    ED Discharge Orders    None       Donnetta Hutching, MD 11/10/17 2320

## 2017-11-11 ENCOUNTER — Encounter (HOSPITAL_COMMUNITY): Payer: Self-pay | Admitting: General Practice

## 2017-11-11 DIAGNOSIS — E1010 Type 1 diabetes mellitus with ketoacidosis without coma: Secondary | ICD-10-CM

## 2017-11-11 LAB — BASIC METABOLIC PANEL
ANION GAP: 11 (ref 5–15)
ANION GAP: 6 (ref 5–15)
Anion gap: 8 (ref 5–15)
Anion gap: 9 (ref 5–15)
BUN: 10 mg/dL (ref 6–20)
BUN: 11 mg/dL (ref 6–20)
BUN: 10 mg/dL (ref 6–20)
BUN: 8 mg/dL (ref 6–20)
CALCIUM: 6.5 mg/dL — AB (ref 8.9–10.3)
CALCIUM: 8 mg/dL — AB (ref 8.9–10.3)
CHLORIDE: 107 mmol/L (ref 101–111)
CO2: 13 mmol/L — AB (ref 22–32)
CO2: 17 mmol/L — AB (ref 22–32)
CO2: 19 mmol/L — ABNORMAL LOW (ref 22–32)
CO2: 21 mmol/L — ABNORMAL LOW (ref 22–32)
CREATININE: 0.76 mg/dL (ref 0.61–1.24)
Calcium: 8 mg/dL — ABNORMAL LOW (ref 8.9–10.3)
Calcium: 7.7 mg/dL — ABNORMAL LOW (ref 8.9–10.3)
Chloride: 113 mmol/L — ABNORMAL HIGH (ref 101–111)
Chloride: 106 mmol/L (ref 101–111)
Chloride: 106 mmol/L (ref 101–111)
Creatinine, Ser: 0.78 mg/dL (ref 0.61–1.24)
Creatinine, Ser: 0.88 mg/dL (ref 0.61–1.24)
Creatinine, Ser: 0.73 mg/dL (ref 0.61–1.24)
GFR calc Af Amer: >60 mL/min (ref >60)
GFR calc non Af Amer: >60 mL/min (ref >60)
GFR calc non Af Amer: >60 mL/min (ref >60)
GLUCOSE: 148 mg/dL — AB (ref 65–99)
Glucose, Bld: 267 mg/dL — ABNORMAL HIGH (ref 65–99)
Glucose, Bld: 208 mg/dL — ABNORMAL HIGH (ref 65–99)
Glucose, Bld: 272 mg/dL — ABNORMAL HIGH (ref 65–99)
POTASSIUM: 4.3 mmol/L (ref 3.5–5.1)
Potassium: 3.3 mmol/L — ABNORMAL LOW (ref 3.5–5.1)
Potassium: 3.6 mmol/L (ref 3.5–5.1)
Potassium: 3.5 mmol/L (ref 3.5–5.1)
SODIUM: 135 mmol/L (ref 135–145)
SODIUM: 133 mmol/L — AB (ref 135–145)
Sodium: 134 mmol/L — ABNORMAL LOW (ref 135–145)
Sodium: 134 mmol/L — ABNORMAL LOW (ref 135–145)

## 2017-11-11 LAB — MRSA PCR SCREENING: MRSA BY PCR: NEGATIVE

## 2017-11-11 LAB — CBG MONITORING, ED
GLUCOSE-CAPILLARY: 298 mg/dL — AB (ref 65–99)
GLUCOSE-CAPILLARY: 256 mg/dL — AB (ref 65–99)
GLUCOSE-CAPILLARY: 167 mg/dL — AB (ref 65–99)
GLUCOSE-CAPILLARY: 146 mg/dL — AB (ref 65–99)
GLUCOSE-CAPILLARY: 240 mg/dL — AB (ref 65–99)
GLUCOSE-CAPILLARY: 236 mg/dL — AB (ref 65–99)
Glucose-Capillary: 133 mg/dL — ABNORMAL HIGH (ref 65–99)
Glucose-Capillary: 143 mg/dL — ABNORMAL HIGH (ref 65–99)
Glucose-Capillary: 151 mg/dL — ABNORMAL HIGH (ref 65–99)
Glucose-Capillary: 177 mg/dL — ABNORMAL HIGH (ref 65–99)
Glucose-Capillary: 200 mg/dL — ABNORMAL HIGH (ref 65–99)
Glucose-Capillary: 393 mg/dL — ABNORMAL HIGH (ref 65–99)

## 2017-11-11 MED ORDER — INSULIN NPH (HUMAN) (ISOPHANE) 100 UNIT/ML ~~LOC~~ SUSP
14.0000 [IU] | Freq: Once | SUBCUTANEOUS | Status: AC
  Administered 2017-11-11: 14 [IU] via SUBCUTANEOUS
  Filled 2017-11-11: qty 10

## 2017-11-11 MED ORDER — SODIUM CHLORIDE 0.9 % IV SOLN
INTRAVENOUS | Status: DC
  Administered 2017-11-11 – 2017-11-12 (×2): via INTRAVENOUS
  Administered 2017-11-12: 75 mL/h via INTRAVENOUS
  Administered 2017-11-13: 04:00:00 via INTRAVENOUS

## 2017-11-11 MED ORDER — INSULIN ASPART PROT & ASPART (70-30 MIX) 100 UNIT/ML ~~LOC~~ SUSP
40.0000 [IU] | Freq: Two times a day (BID) | SUBCUTANEOUS | Status: DC
  Administered 2017-11-11 – 2017-11-12 (×2): 40 [IU] via SUBCUTANEOUS
  Filled 2017-11-11 (×3): qty 10

## 2017-11-11 MED ORDER — ALBUTEROL SULFATE (2.5 MG/3ML) 0.083% IN NEBU: 2.5000 mg | INHALATION_SOLUTION | RESPIRATORY_TRACT | Status: DC | PRN

## 2017-11-11 MED ORDER — INSULIN ASPART 100 UNIT/ML ~~LOC~~ SOLN
0.0000 [IU] | Freq: Three times a day (TID) | SUBCUTANEOUS | Status: DC
  Administered 2017-11-11 – 2017-11-12 (×2): 1 [IU] via SUBCUTANEOUS
  Filled 2017-11-11: qty 1

## 2017-11-11 MED ORDER — ACETAMINOPHEN 325 MG PO TABS: 650.0000 mg | ORAL_TABLET | Freq: Four times a day (QID) | ORAL | Status: DC | PRN

## 2017-11-11 MED ORDER — INSULIN ASPART 100 UNIT/ML ~~LOC~~ SOLN
0.0000 [IU] | Freq: Every day | SUBCUTANEOUS | Status: DC
  Administered 2017-11-11: 2 [IU] via SUBCUTANEOUS

## 2017-11-11 MED ORDER — TRAMADOL HCL 50 MG PO TABS
50.0000 mg | ORAL_TABLET | Freq: Four times a day (QID) | ORAL | Status: DC | PRN
  Administered 2017-11-11: 50 mg via ORAL
  Filled 2017-11-11: qty 1

## 2017-11-11 NOTE — Progress Notes (Signed)
[place] TEAM 1 - Stepdown/ICU TEAM  Rickey Smith  FKC:[id number] DOB: 1965-11-01 DOA: 11/10/2017 PCP: Ladell Pier, MD    Brief Narrative:  52 y.o. male with a hx of substance abuse and insulin-dependent diabetes mellitus who presented with malaise, nausea, and nonbloody vomiting.  He had recently been diagnosed with bronchitis, and was tx w/ doxycycline and prednisone.   In the ED he was found to be tachypneic and tachycardic with stable blood pressure.  EKG featured a sinus tachycardia with rate 124, PVCs, and LVH.  Chest x-ray was negative for acute cardiopulmonary disease.  Chemistry panel was notable for bicarbonate 15, glucose 282, and creatinine 1.31, up from an apparent baseline of 0.6.     Significant Events: 5/20 admit  5/21 insulin gtt stopped   Subjective: The patient is resting comfortably in bed.  He reports complete resolution of his abdominal discomfort.  He still feels somewhat weak but overall states he feels much better.  He denies chest pain nausea vomiting or shortness of breath.  Assessment & Plan:  DKA - uncontrolled DM Incited by steroid dosing in outpatient setting - rapidly corrected with IV insulin - transitioning to home insulin regimen - recheck be met later today and in a.m. - follow CBG  Acute Bronchitis  Clinically resolved - stop steroid - complete course of doxycycline   Acute kidney injury Due to prerenal azotemia in the setting of DKA - resolved with simple volume resuscitation  DVT prophylaxis: Lovenox Code Status: FULL CODE Family Communication: no family present at time of exam  Disposition Plan:   Consultants:  None  Antimicrobials:  Doxycycline 5/20 >  Objective: Blood pressure (!) 155/93, pulse 94, temperature 98.7 F (37.1 C), temperature source Oral, resp. rate (!) 21, height '6\' 1"'  (1.854 m), weight 74.8 kg (165 lb), SpO2 98 %.  Intake/Output Summary (Last 24 hours) at 11/11/2017 1200 Last data filed at 11/11/2017  1141 Gross per 24 hour  Intake 5747.46 ml  Output 850 ml  Net 4897.46 ml   Filed Weights   11/10/17 1619  Weight: 74.8 kg (165 lb)    Examination: General: No acute respiratory distress Lungs: Clear to auscultation bilaterally without wheezes or crackles Cardiovascular: Regular rate and rhythm without murmur gallop or rub normal S1 and S2 Abdomen: Nontender, nondistended, soft, bowel sounds positive, no rebound, no ascites, no appreciable mass Extremities: No significant cyanosis, clubbing, or edema bilateral lower extremities  CBC: Recent Labs  Lab 11/10/17 1643 11/10/17 1855 11/10/17 2320  WBC 12.7*  --   --   NEUTROABS 10.9*  --   --   HGB 14.2 13.3 12.2*  HCT 44.4 39.0 36.0*  MCV 81.6  --   --   PLT 384  --   --    Basic Metabolic Panel: Recent Labs  Lab 11/11/17 0147 11/11/17 0421 11/11/17 0800  NA 134* 134* 135  K 3.3* 3.6 4.3  CL 113* 106 107  CO2 13* 17* 19*  GLUCOSE 267* 208* 148*  BUN '10 11 10  ' CREATININE 0.78 0.88 0.76  CALCIUM 6.5* 8.0* 8.0*   GFR: Estimated Creatinine Clearance: 115.6 mL/min (by C-G formula based on SCr of 0.76 mg/dL).  Liver Function Tests: Recent Labs  Lab 11/10/17 1643 11/10/17 1823  AST 18 13*  ALT 9* 9*  ALKPHOS 91 74  BILITOT 1.4* 1.0  PROT 6.9 5.5*  ALBUMIN 3.5 2.9*   Recent Labs  Lab 11/10/17 1643  LIPASE 21    Cardiac Enzymes:  Recent Labs  Lab 11/10/17 2047  CKTOTAL 56    HbA1C: Hemoglobin A1C  Date/Time Value Ref Range Status  04/16/2017 11:05 AM 11.4  Final   Hgb A1c MFr Bld  Date/Time Value Ref Range Status  06/01/2017 07:36 PM 11.2 (H) 4.8 - 5.6 % Final    Comment:    (NOTE) Pre diabetes:          5.7%-6.4% Diabetes:              >6.4% Glycemic control for   <7.0% adults with diabetes   12/28/2016 08:32 AM 12.2 (H) 4.8 - 5.6 % Final    Comment:    (NOTE)         Pre-diabetes: 5.7 - 6.4         Diabetes: >6.4         Glycemic control for adults with diabetes: <7.0      CBG: Recent Labs  Lab 11/11/17 0615 11/11/17 0721 11/11/17 0825 11/11/17 0927 11/11/17 1102  GLUCAP 177* 143* 151* 133* 146*    Scheduled Meds: . amitriptyline  75 mg Oral QHS  . doxycycline  100 mg Oral BID  . enoxaparin (LOVENOX) injection  40 mg Subcutaneous Q24H  . insulin aspart  0-5 Units Subcutaneous QHS  . insulin aspart  0-9 Units Subcutaneous TID WC     LOS: 1 day   Cherene Altes, MD [facility] Office  [phone] Pager - Text Page per Amion as per below:  On-Call/Text Page:      [email or web address]      password [email or web address]  If 7PM-7AM, please contact night-coverage [email or web address] Password [email or web address] 11/11/2017, 12:00 PM

## 2017-11-11 NOTE — ED Notes (Addendum)
Paged admitting MD in regards to new BMP results

## 2017-11-11 NOTE — ED Notes (Signed)
Checked patient cgb  It was 290 notified RN Asher Muir of blood sugar patient is resting with call bell in reach

## 2017-11-12 LAB — COMPREHENSIVE METABOLIC PANEL
ALK PHOS: 63 U/L (ref 38–126)
ALT: 9 U/L — AB (ref 17–63)
AST: 9 U/L — AB (ref 15–41)
Albumin: 2.6 g/dL — ABNORMAL LOW (ref 3.5–5.0)
Anion gap: 7 (ref 5–15)
BILIRUBIN TOTAL: 0.6 mg/dL (ref 0.3–1.2)
BUN: 6 mg/dL (ref 6–20)
CO2: 24 mmol/L (ref 22–32)
CREATININE: 0.58 mg/dL — AB (ref 0.61–1.24)
Calcium: 8.1 mg/dL — ABNORMAL LOW (ref 8.9–10.3)
Chloride: 105 mmol/L (ref 101–111)
GFR calc Af Amer: >60 mL/min (ref >60)
GFR calc non Af Amer: >60 mL/min (ref >60)
Glucose, Bld: 156 mg/dL — ABNORMAL HIGH (ref 65–99)
Potassium: 3.3 mmol/L — ABNORMAL LOW (ref 3.5–5.1)
Sodium: 136 mmol/L (ref 135–145)
TOTAL PROTEIN: 4.8 g/dL — AB (ref 6.5–8.1)

## 2017-11-12 LAB — MAGNESIUM: Magnesium: 1.7 mg/dL (ref 1.7–2.4)

## 2017-11-12 LAB — GLUCOSE, CAPILLARY
GLUCOSE-CAPILLARY: 113 mg/dL — AB (ref 65–99)
GLUCOSE-CAPILLARY: 133 mg/dL — AB (ref 65–99)
GLUCOSE-CAPILLARY: 139 mg/dL — AB (ref 65–99)
GLUCOSE-CAPILLARY: 242 mg/dL — AB (ref 65–99)
GLUCOSE-CAPILLARY: 271 mg/dL — AB (ref 65–99)
Glucose-Capillary: 83 mg/dL (ref 65–99)

## 2017-11-12 LAB — PHOSPHORUS
Phosphorus: 1.6 mg/dL — ABNORMAL LOW (ref 2.5–4.6)
Phosphorus: 1.7 mg/dL — ABNORMAL LOW (ref 2.5–4.6)

## 2017-11-12 MED ORDER — POTASSIUM CHLORIDE CRYS ER 20 MEQ PO TBCR
20.0000 meq | EXTENDED_RELEASE_TABLET | Freq: Once | ORAL | Status: AC
  Administered 2017-11-12: 20 meq via ORAL
  Filled 2017-11-12: qty 1

## 2017-11-12 MED ORDER — K PHOS MONO-SOD PHOS DI & MONO 155-852-130 MG PO TABS
500.0000 mg | ORAL_TABLET | Freq: Three times a day (TID) | ORAL | Status: DC
  Administered 2017-11-12 (×2): 500 mg via ORAL
  Filled 2017-11-12 (×4): qty 2

## 2017-11-12 MED ORDER — POTASSIUM PHOSPHATE MONOBASIC 500 MG PO TABS
500.0000 mg | ORAL_TABLET | Freq: Three times a day (TID) | ORAL | Status: DC
  Filled 2017-11-12 (×2): qty 1

## 2017-11-12 MED ORDER — INSULIN ASPART PROT & ASPART (70-30 MIX) 100 UNIT/ML ~~LOC~~ SUSP
25.0000 [IU] | Freq: Two times a day (BID) | SUBCUTANEOUS | Status: DC
  Administered 2017-11-12: 25 [IU] via SUBCUTANEOUS
  Filled 2017-11-12: qty 10

## 2017-11-12 MED ORDER — K PHOS MONO-SOD PHOS DI & MONO 155-852-130 MG PO TABS
500.0000 mg | ORAL_TABLET | Freq: Four times a day (QID) | ORAL | Status: DC
  Administered 2017-11-12 (×2): 500 mg via ORAL
  Filled 2017-11-12 (×3): qty 2

## 2017-11-12 MED ORDER — INSULIN ASPART PROT & ASPART (70-30 MIX) 100 UNIT/ML ~~LOC~~ SUSP: 30.0000 [IU] | Freq: Two times a day (BID) | SUBCUTANEOUS | Status: DC

## 2017-11-12 NOTE — Care Management Note (Signed)
Case Management Note  Patient Details  Name: Rickey Smith MRN: [id number] Date of Birth: 21-Apr-1966  Subjective/Objective:                 Spoke to patient at the bedside. He states he goes to [facility], PCP listed at [facility]. He states he is not sure of the last time he went. Patient gets medications at [facility] or [facility] pharmacy, mostly [facility]. He states he gets his DM supplies and insulins at [facility]. He lives by himself, in [place]. He states he drives, does not work. Patient states he has been turned down from medicaid in the past and plans on reapplying in the next two weeks.    Action/Plan:  Patient last seen at [facility] in January per Epic.  He can schedule follow up and use pharmacy there at [place] if needed.   Expected Discharge Date:                  Expected Discharge Plan:  Home/Self Care  In-House Referral:     Discharge planning Services  CM Consult  Post Acute Care Choice:    Choice offered to:     DME Arranged:    DME Agency:     HH Arranged:    HH Agency:     Status of Service:  In process, will continue to follow  If discussed at Long Length of Stay Meetings, dates discussed:    Additional Comments:  Rickey Sabal, RN 11/12/2017, 3:58 PM

## 2017-11-12 NOTE — Progress Notes (Signed)
Rickey Smith  AOZ:[id number] DOB: July 18, 1965 DOA: 11/10/2017 PCP: Marcine Matar, MD    Brief Narrative:  52 y.o. male with a hx of substance abuse and insulin-dependent diabetes mellitus who presented with malaise, nausea, and nonbloody vomiting.  He had recently been diagnosed with bronchitis, and was tx w/ doxycycline and prednisone.   In the ED he was found to be tachypneic and tachycardic with stable blood pressure.  EKG featured a sinus tachycardia with rate 124, PVCs, and LVH.  Chest x-ray was negative for acute cardiopulmonary disease.  Chemistry panel was notable for bicarbonate 15, glucose 282, and creatinine 1.31, up from an apparent baseline of 0.6.     Significant Events: 5/20 admit  5/21 insulin gtt stopped   Subjective: He is feeling ok, eating breakfast   Assessment & Plan:  DKA - uncontrolled DM Incited by steroid dosing in outpatient setting. Treated with insulin gtt.  Now on 70/30. Reduce dose to 30 BID, blood sugar in the 80.   Acute Bronchitis  Clinically resolved - stop steroid - complete course of doxycycline   Acute kidney injury Due to prerenal azotemia in the setting of DKA - resolved with simple volume resuscitation  Hypophosphatemia; replaced orally. Repeat labs in am.   DVT prophylaxis: Lovenox Code Status: FULL CODE Family Communication: no family present at time of exam  Disposition Plan: home in 24 hours after correction of phosphorus level   Consultants:  None  Antimicrobials:  Doxycycline 5/20 >  Objective: Blood pressure 129/79, pulse (!) 115, temperature 99.5 F (37.5 C), temperature source Oral, resp. rate (!) 22, height  (1.854 m), weight 64.3 kg (141 lb 12.1 oz), SpO2 96 %.  Intake/Output Summary (Last 24 hours) at 11/12/2017 1502 Last data filed at 11/12/2017 1400 Gross per 24 hour  Intake 2993.75 ml  Output 2625 ml  Net 368.75 ml   Filed Weights   11/10/17 1619 11/11/17 1908  Weight: 74.8 kg (165 lb) 64.3 kg  (141 lb 12.1 oz)    Examination: General: NAD Lungs: CTA Cardiovascular: S 1, S 2 RRR Abdomen: BS present, soft, nt Extremities: no cyanosis.   CBC: Recent Labs  Lab 11/10/17 1643 11/10/17 1855 11/10/17 2320  WBC 12.7*  --   --   NEUTROABS 10.9*  --   --   HGB 14.2 13.3 12.2*  HCT 44.4 39.0 36.0*  MCV 81.6  --   --   PLT 384  --   --    Basic Metabolic Panel: Recent Labs  Lab 11/11/17 0800 11/11/17 1420 11/12/17 0457 11/12/17 1332  NA 135 133* 136  --   K 4.3 3.5 3.3*  --   CL 107 106 105  --   CO2 19* 21* 24  --   GLUCOSE 148* 272* 156*  --   BUN --   CREATININE 0.76 0.73 0.58*  --   CALCIUM 8.0* 7.7* 8.1*  --   MG  --   --  1.7  --   PHOS  --   --  1.6* 1.7*   GFR: Estimated Creatinine Clearance: 99.4 mL/min (A) (by C-G formula based on SCr of 0.58 mg/dL (L)).  Liver Function Tests: Recent Labs  Lab 11/10/17 1643 11/10/17 1823 11/12/17 0457  AST 18 13* 9*  ALT 9* 9* 9*  ALKPHOS 91 74 63  BILITOT 1.4* 1.0 0.6  PROT 6.9 5.5* 4.8*  ALBUMIN 3.5 2.9* 2.6*   Recent Labs  Lab 11/10/17  1643  LIPASE 21    Cardiac Enzymes: Recent Labs  Lab 11/10/17 2047  CKTOTAL 56    HbA1C: Hemoglobin A1C  Date/Time Value Ref Range Status  04/16/2017 11:05 AM 11.4  Final   Hgb A1c MFr Bld  Date/Time Value Ref Range Status  06/01/2017 07:36 PM 11.2 (H) 4.8 - 5.6 % Final    Comment:    (NOTE) Pre diabetes:          5.7%-6.4% Diabetes:              >6.4% Glycemic control for   <7.0% adults with diabetes   12/28/2016 08:32 AM 12.2 (H) 4.8 - 5.6 % Final    Comment:    (NOTE)         Pre-diabetes: 5.7 - 6.4         Diabetes: >6.4         Glycemic control for adults with diabetes: <7.0     CBG: Recent Labs  Lab 11/11/17 1716 11/11/17 2049 11/12/17 0018 11/12/17 0752 11/12/17 1150  GLUCAP 236* 242* 139* 133* 83    Scheduled Meds: . amitriptyline  75 mg Oral QHS  . doxycycline  100 mg Oral BID  . enoxaparin (LOVENOX) injection  40 mg  Subcutaneous Q24H  . insulin aspart protamine- aspart  30 Units Subcutaneous BID WC  . phosphorus  500 mg Oral QID     LOS: 2 days   Hartley Barefoot, MD [facility] [phone] Pager - Text Page per Loretha Stapler as per below:  On-Call/Text Page:      [email or web address]      password [email or web address]  If 7PM-7AM, please contact night-coverage [email or web address] Password [facility] 11/12/2017, 3:02 PM

## 2017-11-13 LAB — BASIC METABOLIC PANEL
Anion gap: 11 (ref 5–15)
BUN: 8 mg/dL (ref 6–20)
CALCIUM: 7.7 mg/dL — AB (ref 8.9–10.3)
CHLORIDE: 104 mmol/L (ref 101–111)
CO2: 24 mmol/L (ref 22–32)
CREATININE: 0.78 mg/dL (ref 0.61–1.24)
GFR calc non Af Amer: >60 mL/min (ref >60)
Glucose, Bld: 187 mg/dL — ABNORMAL HIGH (ref 65–99)
Potassium: 3.1 mmol/L — ABNORMAL LOW (ref 3.5–5.1)
Sodium: 139 mmol/L (ref 135–145)

## 2017-11-13 LAB — GLUCOSE, RANDOM: Glucose, Bld: 181 mg/dL — ABNORMAL HIGH (ref 65–99)

## 2017-11-13 LAB — GLUCOSE, CAPILLARY
GLUCOSE-CAPILLARY: 128 mg/dL — AB (ref 65–99)
GLUCOSE-CAPILLARY: 151 mg/dL — AB (ref 65–99)
Glucose-Capillary: 164 mg/dL — ABNORMAL HIGH (ref 65–99)
Glucose-Capillary: 41 mg/dL — CL (ref 65–99)

## 2017-11-13 LAB — PHOSPHORUS: PHOSPHORUS: 3.8 mg/dL (ref 2.5–4.6)

## 2017-11-13 MED ORDER — INSULIN ASPART PROT & ASPART (70-30 MIX) 100 UNIT/ML ~~LOC~~ SUSP
20.0000 [IU] | Freq: Two times a day (BID) | SUBCUTANEOUS | Status: DC
  Administered 2017-11-13: 20 [IU] via SUBCUTANEOUS

## 2017-11-13 MED ORDER — GLUCOSE 40 % PO GEL
ORAL | Status: AC
  Administered 2017-11-13: 01:00:00
  Filled 2017-11-13: qty 1

## 2017-11-13 MED ORDER — POTASSIUM CHLORIDE ER 20 MEQ PO TBCR: 10.0000 meq | EXTENDED_RELEASE_TABLET | Freq: Every day | ORAL | 0 refills | Status: DC

## 2017-11-13 MED ORDER — INSULIN ASPART PROT & ASPART (70-30 MIX) 100 UNIT/ML ~~LOC~~ SUSP: 20.0000 [IU] | Freq: Two times a day (BID) | SUBCUTANEOUS | 11 refills | Status: DC

## 2017-11-13 MED ORDER — POTASSIUM CHLORIDE CRYS ER 20 MEQ PO TBCR
40.0000 meq | EXTENDED_RELEASE_TABLET | Freq: Once | ORAL | Status: AC
  Administered 2017-11-13: 40 meq via ORAL
  Filled 2017-11-13: qty 2

## 2017-11-13 NOTE — Progress Notes (Signed)
Nsg Discharge Note  Admit Date:  11/10/2017 Discharge date: 11/13/2017   [name] to be D/C'd Home per MD order.  AVS completed.  Copy for chart, and copy for patient signed, and dated. Patient/caregiver able to verbalize understanding.  Discharge Medication: Allergies as of 11/13/2017      Reactions   Gabapentin Diarrhea, Nausea And Vomiting   Had to be hospitalized because of the reaction(s)   Codeine Itching   Penicillins Hives, Rash   Has patient had a PCN reaction causing immediate rash, facial/tongue/throat swelling, SOB or lightheadedness with hypotension: Yes Has patient had a PCN reaction causing severe rash involving mucus membranes or skin necrosis: Unk Has patient had a PCN reaction that required hospitalization: Unk Has patient had a PCN reaction occurring within the last 10 years: Yes If all of the above answers are "NO", then may proceed with Cephalosporin use.      Medication List    STOP taking these medications   insulin NPH-regular Human (70-30) 100 UNIT/ML injection Commonly known as:  NOVOLIN 70/30   predniSONE 10 MG tablet Commonly known as:  DELTASONE     TAKE these medications   acetaminophen 325 MG tablet Commonly known as:  TYLENOL Take 650 mg by mouth every 6 (six) hours as needed for mild pain.   amitriptyline 75 MG tablet Commonly known as:  ELAVIL Take 1 tablet (75 mg total) by mouth at bedtime.   doxycycline 100 MG capsule Commonly known as:  VIBRAMYCIN Take 100 mg by mouth 2 (two) times daily. FOR 10 DAYS   insulin aspart protamine- aspart (70-30) 100 UNIT/ML injection Commonly known as:  NOVOLOG MIX 70/30 Inject 0.2 mLs (20 Units total) into the skin 2 (two) times daily with a meal.   Potassium Chloride ER 20 MEQ Tbcr Take 10 mEq by mouth daily.       Discharge Assessment: Vitals:   11/13/17 0007 11/13/17 0416  BP: 132/86 111/62  Pulse: 97 (!) 105  Resp: 16 12  Temp: 98.3 F (36.8 C) 97.9 F (36.6 C)  SpO2: 98% 99%    Skin clean, dry and intact without evidence of skin break down, no evidence of skin tears noted. IV catheter discontinued intact. Site without signs and symptoms of complications - no redness or edema noted at insertion site, patient denies c/o pain - only slight tenderness at site.  Dressing with slight pressure applied.  D/c Instructions-Education: Discharge instructions given to patient/family with verbalized understanding. D/c education completed with patient/family including follow up instructions, medication list, d/c activities limitations if indicated, with other d/c instructions as indicated by MD - patient able to verbalize understanding, all questions fully answered. Patient instructed to return to ED, call 911, or call MD for any changes in condition.  Patient escorted via WC, and D/C home via private auto.  Emanuele Mcwhirter Consuella Lose, RN 11/13/2017 3:14 PM

## 2017-11-13 NOTE — Discharge Summary (Signed)
Physician Discharge Summary  Rickey Smith:[id number] DOB: 23-Jul-1965 DOA: 11/10/2017  PCP: Marcine Matar, MD  Admit date: 11/10/2017 Discharge date: 11/13/2017   Admitted From: Home  Disposition:  Home   Recommendations for Outpatient Follow-up:  1. Follow up with PCP in 1-2 weeks 2. Please obtain BMP/CBC in one week 3. Further adjustment of insulin regimen depending on Blood sugar.  4. Repeat phosphorus , K level.   Home Health: none  Discharge Condition: Stable.  CODE STATUS: full code.  Diet recommendation: Carb Modified   Brief/Interim Summary: Brief Narrative:  52 y.o.malewith a hx of substance abuse and insulin-dependent diabetes mellitus who presented with malaise, nausea, and nonbloody vomiting. He had recently been diagnosed with bronchitis, and was tx w/ doxycycline and prednisone.   In the ED he was found to be tachypneic and tachycardic with stable blood pressure. EKG featured a sinus tachycardia with rate 124, PVCs, and LVH. Chest x-ray was negative for acute cardiopulmonary disease. Chemistry panel was notable for bicarbonate 15, glucose 282, and creatinine 1.31, up from an apparent baseline of 0.6.    Significant Events: 5/20 admit  5/21 insulin gtt stopped   Subjective: He is feeling ok, eating breakfast   Assessment & Plan:  DKA - uncontrolled DM Incited by steroid dosing in outpatient setting. Treated with insulin gtt.  Now on 70/30. Reduce dose to 20 BID. He had low blood sugar at 12 midnight down to 40. He has been eating less here in the hospital. Advised to check blood sugar at midnight today and to eat snack at bedtime tonight.   Acute Bronchitis  Clinically resolved - stop steroid - complete course of doxycycline   Acute kidney injury Due to prerenal azotemia in the setting of DKA - resolved with simple volume resuscitation  Hypophosphatemia; replaced orally. Resolved.       Discharge Diagnoses:  Principal  Problem:   DKA (diabetic ketoacidoses) (HCC) Active Problems:   Hyperkalemia   Polysubstance abuse (HCC)   Uncontrolled type 2 diabetes mellitus with hyperglycemia, with long-term current use of insulin (HCC)   AKI (acute kidney injury) [facility])    Discharge Instructions  Discharge Instructions    Diet Carb Modified   Complete by:  As directed    Increase activity slowly   Complete by:  As directed      Allergies as of 11/13/2017      Reactions   Gabapentin Diarrhea, Nausea And Vomiting   Had to be hospitalized because of the reaction(s)   Codeine Itching   Penicillins Hives, Rash   Has patient had a PCN reaction causing immediate rash, facial/tongue/throat swelling, SOB or lightheadedness with hypotension: Yes Has patient had a PCN reaction causing severe rash involving mucus membranes or skin necrosis: Unk Has patient had a PCN reaction that required hospitalization: Unk Has patient had a PCN reaction occurring within the last 10 years: Yes If all of the above answers are "NO", then may proceed with Cephalosporin use.      Medication List    STOP taking these medications   insulin NPH-regular Human (70-30) 100 UNIT/ML injection Commonly known as:  NOVOLIN 70/30   predniSONE 10 MG tablet Commonly known as:  DELTASONE     TAKE these medications   acetaminophen 325 MG tablet Commonly known as:  TYLENOL Take 650 mg by mouth every 6 (six) hours as needed for mild pain.   amitriptyline 75 MG tablet Commonly known as:  ELAVIL Take 1 tablet (75 mg total)  by mouth at bedtime.   doxycycline 100 MG capsule Commonly known as:  VIBRAMYCIN Take 100 mg by mouth 2 (two) times daily. FOR 10 DAYS   insulin aspart protamine- aspart (70-30) 100 UNIT/ML injection Commonly known as:  NOVOLOG MIX 70/30 Inject 0.2 mLs (20 Units total) into the skin 2 (two) times daily with a meal.   Potassium Chloride ER 20 MEQ Tbcr Take 10 mEq by mouth daily.      Follow-up Information    [facility] Follow up on 12/16/2017.   Why:  Post hospital follow up scheduled for 9:50 am on 12/16/2017 with Dr. Brynda Peon information: [address] [place] [address] [phone]         Allergies  Allergen Reactions  . Gabapentin Diarrhea and Nausea And Vomiting    Had to be hospitalized because of the reaction(s)  . Codeine Itching  . Penicillins Hives and Rash    Has patient had a PCN reaction causing immediate rash, facial/tongue/throat swelling, SOB or lightheadedness with hypotension: Yes Has patient had a PCN reaction causing severe rash involving mucus membranes or skin necrosis: Unk Has patient had a PCN reaction that required hospitalization: Unk Has patient had a PCN reaction occurring within the last 10 years: Yes If all of the above answers are "NO", then may proceed with Cephalosporin use.     Consultations:  none   Procedures/Studies: Dg Chest 2 View  Result Date: 11/10/2017 CLINICAL DATA:  Cough EXAM: CHEST - 2 VIEW COMPARISON:  08/17/2017 FINDINGS: Hyperinflation. Scarring in the left upper lobe. No acute opacity or pleural effusion. Normal heart size. No pneumothorax. IMPRESSION: No active cardiopulmonary disease. Electronically Signed   By: Jasmine Pang M.D.   On: 11/10/2017 18:14     Subjective: He is feeling better, muscle weakness resolved.   Discharge Exam: Vitals:   11/13/17 0007 11/13/17 0416  BP: 132/86 111/62  Pulse: 97 (!) 105  Resp: 16 12  Temp: 98.3 F (36.8 C) 97.9 F (36.6 C)  SpO2: 98% 99%   Vitals:   11/12/17 1618 11/12/17 2034 11/13/17 0007 11/13/17 0416  BP: 128/86 115/81 132/86 111/62  Pulse: (!) 103 (!) 106 97 (!) 105  Resp: Temp: 98.2 F (36.8 C) 98.3 F (36.8 C) 98.3 F (36.8 C) 97.9 F (36.6 C)  TempSrc: Oral Oral Oral Oral  SpO2: 98% 97% 98% 99%  Weight:      Height:        General: Pt is alert, awake, not in acute distress Cardiovascular:  RRR, S1/S2 +, no rubs, no gallops Respiratory: CTA bilaterally, no wheezing, no rhonchi Abdominal: Soft, NT, ND, bowel sounds + Extremities: no edema, no cyanosis    The results of significant diagnostics from this hospitalization (including imaging, microbiology, ancillary and laboratory) are listed below for reference.     Microbiology: Recent Results (from the past 240 hour(s))  MRSA PCR Screening     Status: None   Collection Time: 11/11/17  8:17 PM  Result Value Ref Range Status   MRSA by PCR NEGATIVE NEGATIVE Final    Comment:        The GeneXpert MRSA Assay (FDA approved for NASAL specimens only), is one component of a comprehensive MRSA colonization surveillance program. It is not intended to diagnose MRSA infection nor to guide or monitor treatment for MRSA infections. Performed at [facility], [address], [place], [place] [address]  Labs: BNP (last 3 results) No results for input(s): BNP in the last 8760 hours. Basic Metabolic Panel: Recent Labs  Lab 11/11/17 0421 11/11/17 0800 11/11/17 1420 11/12/17 0457 11/12/17 1332 11/13/17 0054  NA 134* 135 133* 136  --  139  K 3.6 4.3 3.5 3.3*  --  3.1*  CL 106 107 106 105  --  104  CO2 17* 19* 21* 24  --  24  GLUCOSE 208* 148* 272* 156*  --  187*  181*  BUN --  8  CREATININE 0.88 0.76 0.73 0.58*  --  0.78  CALCIUM 8.0* 8.0* 7.7* 8.1*  --  7.7*  MG  --   --   --  1.7  --   --   PHOS  --   --   --  1.6* 1.7* 3.8   Liver Function Tests: Recent Labs  Lab 11/10/17 1643 11/10/17 1823 11/12/17 0457  AST 18 13* 9*  ALT 9* 9* 9*  ALKPHOS 91 74 63  BILITOT 1.4* 1.0 0.6  PROT 6.9 5.5* 4.8*  ALBUMIN 3.5 2.9* 2.6*   Recent Labs  Lab 11/10/17 1643  LIPASE 21   No results for input(s): AMMONIA in the last 168 hours. CBC: Recent Labs  Lab 11/10/17 1643 11/10/17 1855 11/10/17 2320  WBC 12.7*  --   --   NEUTROABS 10.9*  --   --   HGB 14.2 13.3 12.2*  HCT 44.4 39.0 36.0*  MCV  81.6  --   --   PLT 384  --   --    Cardiac Enzymes: Recent Labs  Lab 11/10/17 2047  CKTOTAL 56   BNP: Invalid input(s): POCBNP CBG: Recent Labs  Lab 11/12/17 2032 11/13/17 0010 11/13/17 0040 11/13/17 0812 11/13/17 1204  GLUCAP 113* 41* 128* 151* 164*   D-Dimer No results for input(s): DDIMER in the last 72 hours. Hgb A1c No results for input(s): HGBA1C in the last 72 hours. Lipid Profile No results for input(s): CHOL, HDL, LDLCALC, TRIG, CHOLHDL, LDLDIRECT in the last 72 hours. Thyroid function studies No results for input(s): TSH, T4TOTAL, T3FREE, THYROIDAB in the last 72 hours.  Invalid input(s): FREET3 Anemia work up No results for input(s): VITAMINB12, FOLATE, FERRITIN, TIBC, IRON, RETICCTPCT in the last 72 hours. Urinalysis    Component Value Date/Time   COLORURINE YELLOW 11/10/2017 1800   APPEARANCEUR CLEAR 11/10/2017 1800   LABSPEC 1.028 11/10/2017 1800   PHURINE 5.0 11/10/2017 1800   GLUCOSEU >=500 (A) 11/10/2017 1800   HGBUR NEGATIVE 11/10/2017 1800   BILIRUBINUR NEGATIVE 11/10/2017 1800   BILIRUBINUR neg 08/10/2015 1041   KETONESUR 80 (A) 11/10/2017 1800   PROTEINUR 30 (A) 11/10/2017 1800   UROBILINOGEN 2.0 08/10/2015 1041   UROBILINOGEN 0.2 10/23/2014 0817   NITRITE NEGATIVE 11/10/2017 1800   LEUKOCYTESUR NEGATIVE 11/10/2017 1800   Sepsis Labs Invalid input(s): PROCALCITONIN,  WBC,  LACTICIDVEN Microbiology Recent Results (from the past 240 hour(s))  MRSA PCR Screening     Status: None   Collection Time: 11/11/17  8:17 PM  Result Value Ref Range Status   MRSA by PCR NEGATIVE NEGATIVE Final    Comment:        The GeneXpert MRSA Assay (FDA approved for NASAL specimens only), is one component of a comprehensive MRSA colonization surveillance program. It is not intended to diagnose MRSA infection nor to guide or monitor treatment for MRSA infections. Performed at [facility], [address], [place], [place]  [address]      Time  coordinating discharge: 35 minutes   SIGNED:   Alba Cory, MD  [facility] 11/13/2017, 12:47 PM Pager   If 7PM-7AM, please contact night-coverage [email or web address] Password [email or web address]

## 2017-11-13 NOTE — Progress Notes (Addendum)
Patients glucose dropped to 41, feels shaky, otherwise asymptomatic. 1 tube of glucose gel administered. Will monitor  Addendum: Glucose is 128. Pt instructed to notify RN if he develops s/s of hypoglycemia.

## 2017-11-13 NOTE — Care Management Note (Signed)
Case Management Note  Patient Details  Name: Rickey Smith MRN: [id number] Date of Birth: 1966/03/27  Subjective/Objective:        DKA            Action/Plan: Transition to home. Pt to f/u with PCP @ [facility]. NCM scheduled appointment time and noted on AVS.  Expected Discharge Date:  11/13/17               Expected Discharge Plan:  Home/Self Care  In-House Referral:     Discharge planning Services  CM Consult, Follow-[facility] appt scheduled, [facility]  Post Acute Care Choice:    Status of Service:  Completed, signed off  If discussed at Long Length of Stay Meetings, dates discussed:    Additional Comments:  Epifanio Lesches, RN 11/13/2017, 10:26 AM

## 2017-11-13 NOTE — Progress Notes (Signed)
Discharge instructions (including medications) discussed with and copy provided to patient/caregiver 

## 2017-12-16 ENCOUNTER — Ambulatory Visit: Payer: Self-pay | Admitting: Internal Medicine

## 2018-04-28 ENCOUNTER — Inpatient Hospital Stay (HOSPITAL_COMMUNITY)
Admission: EM | Admit: 2018-04-28 | Discharge: 2018-05-01 | DRG: 638 | Disposition: A | Discharge status: Home / Self Care | Payer: Self-pay | Attending: Internal Medicine | Admitting: Internal Medicine

## 2018-04-28 ENCOUNTER — Encounter (HOSPITAL_COMMUNITY): Payer: Self-pay | Admitting: Emergency Medicine

## 2018-04-28 ENCOUNTER — Other Ambulatory Visit: Payer: Self-pay

## 2018-04-28 ENCOUNTER — Emergency Department (HOSPITAL_COMMUNITY): Payer: Self-pay

## 2018-04-28 DIAGNOSIS — E1165 Type 2 diabetes mellitus with hyperglycemia: Secondary | ICD-10-CM

## 2018-04-28 DIAGNOSIS — R112 Nausea with vomiting, unspecified: Secondary | ICD-10-CM

## 2018-04-28 DIAGNOSIS — E1110 Type 2 diabetes mellitus with ketoacidosis without coma: Principal | ICD-10-CM | POA: Diagnosis present

## 2018-04-28 DIAGNOSIS — Z9079 Acquired absence of other genital organ(s): Secondary | ICD-10-CM

## 2018-04-28 DIAGNOSIS — E876 Hypokalemia: Secondary | ICD-10-CM | POA: Diagnosis present

## 2018-04-28 DIAGNOSIS — K3184 Gastroparesis: Secondary | ICD-10-CM | POA: Diagnosis present

## 2018-04-28 DIAGNOSIS — K209 Esophagitis, unspecified: Secondary | ICD-10-CM | POA: Diagnosis present

## 2018-04-28 DIAGNOSIS — E860 Dehydration: Secondary | ICD-10-CM | POA: Diagnosis present

## 2018-04-28 DIAGNOSIS — N179 Acute kidney failure, unspecified: Secondary | ICD-10-CM | POA: Diagnosis present

## 2018-04-28 DIAGNOSIS — R933 Abnormal findings on diagnostic imaging of other parts of digestive tract: Secondary | ICD-10-CM

## 2018-04-28 DIAGNOSIS — F1910 Other psychoactive substance abuse, uncomplicated: Secondary | ICD-10-CM | POA: Diagnosis present

## 2018-04-28 DIAGNOSIS — E1143 Type 2 diabetes mellitus with diabetic autonomic (poly)neuropathy: Secondary | ICD-10-CM | POA: Diagnosis present

## 2018-04-28 DIAGNOSIS — I10 Essential (primary) hypertension: Secondary | ICD-10-CM | POA: Diagnosis present

## 2018-04-28 DIAGNOSIS — Z87891 Personal history of nicotine dependence: Secondary | ICD-10-CM

## 2018-04-28 DIAGNOSIS — Z794 Long term (current) use of insulin: Secondary | ICD-10-CM

## 2018-04-28 DIAGNOSIS — F1410 Cocaine abuse, uncomplicated: Secondary | ICD-10-CM | POA: Diagnosis present

## 2018-04-28 DIAGNOSIS — E119 Type 2 diabetes mellitus without complications: Secondary | ICD-10-CM

## 2018-04-28 DIAGNOSIS — F1490 Cocaine use, unspecified, uncomplicated: Secondary | ICD-10-CM | POA: Diagnosis present

## 2018-04-28 DIAGNOSIS — Z8249 Family history of ischemic heart disease and other diseases of the circulatory system: Secondary | ICD-10-CM

## 2018-04-28 DIAGNOSIS — E869 Volume depletion, unspecified: Secondary | ICD-10-CM

## 2018-04-28 DIAGNOSIS — Z66 Do not resuscitate: Secondary | ICD-10-CM | POA: Diagnosis present

## 2018-04-28 DIAGNOSIS — E1142 Type 2 diabetes mellitus with diabetic polyneuropathy: Secondary | ICD-10-CM | POA: Diagnosis present

## 2018-04-28 LAB — CBC WITH DIFFERENTIAL/PLATELET
Abs Immature Granulocytes: 0.01 10*3/uL (ref 0.00–0.07)
Basophils Absolute: 0 10*3/uL (ref 0.0–0.1)
Basophils Relative: 0 %
EOS ABS: 0 10*3/uL (ref 0.0–0.5)
Eosinophils Relative: 0 %
HEMATOCRIT: 43.1 % (ref 39.0–52.0)
HEMOGLOBIN: 13.5 g/dL (ref 13.0–17.0)
Immature Granulocytes: 0 %
LYMPHS ABS: 0.6 10*3/uL — AB (ref 0.7–4.0)
Lymphocytes Relative: 7 %
MCH: 27.2 pg (ref 26.0–34.0)
MCHC: 31.3 g/dL (ref 30.0–36.0)
MCV: 86.9 fL (ref 80.0–100.0)
MONO ABS: 0.3 10*3/uL (ref 0.1–1.0)
Monocytes Relative: 4 %
Neutro Abs: 6.5 10*3/uL (ref 1.7–7.7)
Neutrophils Relative %: 89 %
Platelets: 246 10*3/uL (ref 150–400)
RBC: 4.96 MIL/uL (ref 4.22–5.81)
RDW: 13.4 % (ref 11.5–15.5)
WBC: 7.4 10*3/uL (ref 4.0–10.5)
nRBC: 0 % (ref 0.0–0.2)

## 2018-04-28 LAB — BLOOD GAS, VENOUS
Acid-base deficit: 8.5 mmol/L — ABNORMAL HIGH (ref 0.0–2.0)
Bicarbonate: 17.1 mmol/L — ABNORMAL LOW (ref 20.0–28.0)
FIO2: 21
O2 SAT: 60.2 %
PATIENT TEMPERATURE: 37.1
pCO2, Ven: 32.8 mmHg — ABNORMAL LOW (ref 44.0–60.0)
pH, Ven: 7.31 (ref 7.250–7.430)
pO2, Ven: 35.3 mmHg (ref 32.0–45.0)

## 2018-04-28 LAB — URINALYSIS, ROUTINE W REFLEX MICROSCOPIC
BACTERIA UA: NONE SEEN
Bilirubin Urine: NEGATIVE
Hgb urine dipstick: NEGATIVE
KETONES UR: 80 mg/dL — AB
Leukocytes, UA: NEGATIVE
NITRITE: NEGATIVE
PH: 5 (ref 5.0–8.0)
Protein, ur: NEGATIVE mg/dL
Specific Gravity, Urine: 1.029 (ref 1.005–1.030)

## 2018-04-28 LAB — COMPREHENSIVE METABOLIC PANEL
ALK PHOS: 76 U/L (ref 38–126)
ALT: 15 U/L (ref 0–44)
AST: 13 U/L — AB (ref 15–41)
Albumin: 4.5 g/dL (ref 3.5–5.0)
Anion gap: 18 — ABNORMAL HIGH (ref 5–15)
BILIRUBIN TOTAL: 1.4 mg/dL — AB (ref 0.3–1.2)
BUN: 22 mg/dL — AB (ref 6–20)
CALCIUM: 9.3 mg/dL (ref 8.9–10.3)
CO2: 15 mmol/L — ABNORMAL LOW (ref 22–32)
CREATININE: 1.2 mg/dL (ref 0.61–1.24)
Chloride: 105 mmol/L (ref 98–111)
GFR calc non Af Amer: >60 mL/min (ref >60)
Glucose, Bld: 330 mg/dL — ABNORMAL HIGH (ref 70–99)
Potassium: 4.5 mmol/L (ref 3.5–5.1)
Sodium: 138 mmol/L (ref 135–145)
Total Protein: 8.3 g/dL — ABNORMAL HIGH (ref 6.5–8.1)

## 2018-04-28 LAB — BASIC METABOLIC PANEL
ANION GAP: 14 (ref 5–15)
ANION GAP: 8 (ref 5–15)
BUN: 18 mg/dL (ref 6–20)
BUN: 17 mg/dL (ref 6–20)
CALCIUM: 8.8 mg/dL — AB (ref 8.9–10.3)
CALCIUM: 8 mg/dL — AB (ref 8.9–10.3)
CO2: 18 mmol/L — AB (ref 22–32)
CO2: 19 mmol/L — AB (ref 22–32)
Chloride: 102 mmol/L (ref 98–111)
Chloride: 107 mmol/L (ref 98–111)
Creatinine, Ser: 0.97 mg/dL (ref 0.61–1.24)
Creatinine, Ser: 0.82 mg/dL (ref 0.61–1.24)
GFR calc Af Amer: >60 mL/min (ref >60)
GFR calc Af Amer: >60 mL/min (ref >60)
GLUCOSE: 307 mg/dL — AB (ref 70–99)
GLUCOSE: 240 mg/dL — AB (ref 70–99)
POTASSIUM: 4.1 mmol/L (ref 3.5–5.1)
Potassium: 4 mmol/L (ref 3.5–5.1)
Sodium: 134 mmol/L — ABNORMAL LOW (ref 135–145)
Sodium: 134 mmol/L — ABNORMAL LOW (ref 135–145)

## 2018-04-28 LAB — GLUCOSE, CAPILLARY
GLUCOSE-CAPILLARY: 294 mg/dL — AB (ref 70–99)
GLUCOSE-CAPILLARY: 208 mg/dL — AB (ref 70–99)
GLUCOSE-CAPILLARY: 219 mg/dL — AB (ref 70–99)
GLUCOSE-CAPILLARY: 172 mg/dL — AB (ref 70–99)
Glucose-Capillary: 284 mg/dL — ABNORMAL HIGH (ref 70–99)
Glucose-Capillary: 211 mg/dL — ABNORMAL HIGH (ref 70–99)
Glucose-Capillary: 147 mg/dL — ABNORMAL HIGH (ref 70–99)

## 2018-04-28 LAB — RAPID URINE DRUG SCREEN, HOSP PERFORMED
AMPHETAMINES: NOT DETECTED
BARBITURATES: NOT DETECTED
BENZODIAZEPINES: NOT DETECTED
Cocaine: POSITIVE — AB
Opiates: POSITIVE — AB
Tetrahydrocannabinol: NOT DETECTED

## 2018-04-28 LAB — I-STAT CHEM 8, ED
BUN: 23 mg/dL — AB (ref 6–20)
CALCIUM ION: 1.16 mmol/L (ref 1.15–1.40)
Chloride: 107 mmol/L (ref 98–111)
Creatinine, Ser: 1.1 mg/dL (ref 0.61–1.24)
Glucose, Bld: 330 mg/dL — ABNORMAL HIGH (ref 70–99)
HCT: 44 % (ref 39.0–52.0)
HEMOGLOBIN: 15 g/dL (ref 13.0–17.0)
Potassium: 4.5 mmol/L (ref 3.5–5.1)
Sodium: 138 mmol/L (ref 135–145)
TCO2: 16 mmol/L — AB (ref 22–32)

## 2018-04-28 LAB — CBG MONITORING, ED
GLUCOSE-CAPILLARY: 211 mg/dL — AB (ref 70–99)
Glucose-Capillary: 319 mg/dL — ABNORMAL HIGH (ref 70–99)

## 2018-04-28 LAB — MRSA PCR SCREENING: MRSA by PCR: NEGATIVE

## 2018-04-28 MED ORDER — INSULIN REGULAR(HUMAN) IN NACL 100-0.9 UT/100ML-% IV SOLN
INTRAVENOUS | Status: DC
  Filled 2018-04-28: qty 100

## 2018-04-28 MED ORDER — SODIUM CHLORIDE 0.45 % IV SOLN
INTRAVENOUS | Status: DC
  Administered 2018-04-28: 15:00:00 via INTRAVENOUS

## 2018-04-28 MED ORDER — SODIUM CHLORIDE 0.9 % IV BOLUS
1000.0000 mL | Freq: Once | INTRAVENOUS | Status: AC
  Administered 2018-04-28: 1000 mL via INTRAVENOUS

## 2018-04-28 MED ORDER — POTASSIUM CHLORIDE 10 MEQ/100ML IV SOLN
10.0000 meq | INTRAVENOUS | Status: AC
  Administered 2018-04-28 (×2): 10 meq via INTRAVENOUS
  Filled 2018-04-28 (×2): qty 100

## 2018-04-28 MED ORDER — ONDANSETRON HCL 4 MG/2ML IJ SOLN
4.0000 mg | Freq: Four times a day (QID) | INTRAMUSCULAR | Status: DC | PRN
  Administered 2018-04-28 – 2018-04-29 (×3): 4 mg via INTRAVENOUS
  Filled 2018-04-28 (×3): qty 2

## 2018-04-28 MED ORDER — SODIUM CHLORIDE 0.9 % IV SOLN
INTRAVENOUS | Status: AC
  Administered 2018-04-28: 18:00:00 via INTRAVENOUS

## 2018-04-28 MED ORDER — ACETAMINOPHEN 325 MG PO TABS
650.0000 mg | ORAL_TABLET | Freq: Four times a day (QID) | ORAL | Status: DC | PRN
  Administered 2018-04-28 – 2018-04-30 (×5): 650 mg via ORAL
  Filled 2018-04-28 (×5): qty 2

## 2018-04-28 MED ORDER — INSULIN REGULAR(HUMAN) IN NACL 100-0.9 UT/100ML-% IV SOLN
INTRAVENOUS | Status: DC
  Administered 2018-04-28: 2.3 [IU]/h via INTRAVENOUS
  Filled 2018-04-28 (×2): qty 100

## 2018-04-28 MED ORDER — SODIUM CHLORIDE 0.9 % IV SOLN
INTRAVENOUS | Status: DC
  Administered 2018-04-28: 19:00:00 via INTRAVENOUS

## 2018-04-28 MED ORDER — ENOXAPARIN SODIUM 40 MG/0.4ML ~~LOC~~ SOLN
40.0000 mg | SUBCUTANEOUS | Status: DC
  Administered 2018-04-28 – 2018-05-01 (×4): 40 mg via SUBCUTANEOUS
  Filled 2018-04-28 (×4): qty 0.4

## 2018-04-28 MED ORDER — FENTANYL CITRATE (PF) 100 MCG/2ML IJ SOLN
25.0000 ug | Freq: Once | INTRAMUSCULAR | Status: AC
  Administered 2018-04-28: 25 ug via INTRAVENOUS
  Filled 2018-04-28: qty 2

## 2018-04-28 MED ORDER — ONDANSETRON HCL 4 MG/2ML IJ SOLN
4.0000 mg | Freq: Once | INTRAMUSCULAR | Status: AC
  Administered 2018-04-28: 4 mg via INTRAVENOUS
  Filled 2018-04-28: qty 2

## 2018-04-28 MED ORDER — TRAZODONE HCL 50 MG PO TABS
50.0000 mg | ORAL_TABLET | Freq: Every evening | ORAL | Status: DC | PRN
  Administered 2018-04-28 – 2018-04-30 (×3): 50 mg via ORAL
  Filled 2018-04-28 (×3): qty 1

## 2018-04-28 MED ORDER — ONDANSETRON HCL 4 MG PO TABS: 4.0000 mg | ORAL_TABLET | Freq: Four times a day (QID) | ORAL | Status: DC | PRN

## 2018-04-28 MED ORDER — DEXTROSE-NACL 5-0.45 % IV SOLN
INTRAVENOUS | Status: DC
  Administered 2018-04-28: 21:00:00 via INTRAVENOUS

## 2018-04-28 MED ORDER — INSULIN ASPART 100 UNIT/ML ~~LOC~~ SOLN: 0.0000 [IU] | SUBCUTANEOUS | Status: DC

## 2018-04-28 MED ORDER — LORAZEPAM 2 MG/ML IJ SOLN
0.5000 mg | Freq: Once | INTRAMUSCULAR | Status: AC
  Administered 2018-04-28: 0.5 mg via INTRAVENOUS
  Filled 2018-04-28: qty 1

## 2018-04-28 NOTE — ED Triage Notes (Signed)
Diabetic, vomiting for past 12 hours. Pt reports generalized abd pain. Denies diarrhea.

## 2018-04-28 NOTE — ED Provider Notes (Signed)
[facility] EMERGENCY DEPARTMENT Provider Note   CSN: [id number] Arrival date & time: 04/28/18  4098     History   Chief Complaint Chief Complaint  Patient presents with  . Emesis    HPI Rickey Smith is a 52 y.o. male.  52 y/o male with a PMH of DKA, CHF, AKI presents to the ED via ems with a chief complaint of nausea, emesis since last night. Patient reports "I am in [place]", he reports multiple episodes of emesis along with nausea. He received a 1000 bolus during transport along with Zofran to help his symptoms.He also reports generalized abdominal pain but denies any diarrhea. According to patient's chart he was admitted for DKA in May 2019. Patient reports feeling the same. He denies any chest pain, shortness of breath, fever, or sick contacts.      Past Medical History:  Diagnosis Date  . CHF (congestive heart failure) (HCC)    "think I have this before" (11/11/2017)  . Diabetic peripheral neuropathy (HCC)   . Migraine    "once q month or 2" (11/11/2017)  . Type II diabetes mellitus [facility])     Patient Active Problem List   Diagnosis Date Noted  . AKI (acute kidney injury) (HCC) 11/10/2017  . DKA (diabetic ketoacidoses) (HCC) 08/17/2017  . Hyperlipidemia 06/26/2017  . Uncontrolled type 2 diabetes mellitus with hyperglycemia, with long-term current use of insulin (HCC) 12/28/2016  . Cocaine use 12/28/2016  . Polysubstance abuse (HCC) 04/30/2016  . Diabetic neuropathy (HCC) 08/10/2015  . Tachycardia 08/02/2015  . GERD (gastroesophageal reflux disease) 10/26/2014  . Hyperkalemia 10/23/2014  . Ex-smoker 04/26/2014    Past Surgical History:  Procedure Laterality Date  . TRANSURETHRAL RESECTION OF PROSTATE N/A 08/16/2015   Procedure: TRANSURETHRAL RESECTION DRAINAGE OF PROSTATE ABCESS;  Surgeon: Barron Alvine, MD;  Location: [facility] ORS;  Service: Urology;  Laterality: N/A;        Home Medications    Prior to Admission medications   Medication Sig Start Date End Date  Taking? Authorizing Provider  amitriptyline (ELAVIL) 75 MG tablet Take 1 tablet (75 mg total) by mouth at bedtime. 04/16/17   Vivianne Master, PA-C  insulin aspart protamine- aspart (NOVOLOG MIX 70/30) (70-30) 100 UNIT/ML injection Inject 0.2 mLs (20 Units total) into the skin 2 (two) times daily with a meal. Patient taking differently: Inject 40-45 Units into the skin 2 (two) times daily with a meal. Injects 45 units every morning and 40 every night. 11/13/17   Regalado, Prentiss Bells, MD    Family History Family History  Problem Relation Age of Onset  . Heart attack Unknown   . Prostate cancer Unknown   . Hypertension Unknown   . Hypertension Mother   . Cancer Father     Social History Social History   Tobacco Use  . Smoking status: Former Smoker    Packs/day: 0.50    Years: 34.00    Pack years: 17.00    Types: Cigarettes    Last attempt to quit: 02/2017    Years since quitting: 1.1  . Smokeless tobacco: Never Used  Substance Use Topics  . Alcohol use: Not Currently  . Drug use: Yes    Types: Cocaine, Heroin    Comment: 11/11/2017      Allergies   Gabapentin; Codeine; and Penicillins   Review of Systems Review of Systems  Constitutional: Negative for chills and fever.  HENT: Negative for sore throat.   Respiratory: Negative for shortness of breath.  Cardiovascular: Negative for chest pain.  Gastrointestinal: Positive for nausea and vomiting. Negative for diarrhea.  Genitourinary: Negative for dysuria and flank pain.  Musculoskeletal: Negative for back pain.  Skin: Negative for pallor and wound.  Neurological: Negative for light-headedness and headaches.     Physical Exam Updated Vital Signs BP 133/73   Pulse (!) 124   Temp 97.7 F (36.5 C) (Oral)   Resp 15   Ht 6\' 1"  (1.854 m)   Wt 77.1 kg   SpO2 97%   BMI 22.43 kg/m   Physical Exam  Constitutional: He is oriented to person, place, and time. He appears well-developed and well-nourished.  HENT:  Head:  Normocephalic and atraumatic.  Neck: Normal range of motion. Neck supple.  Cardiovascular: Normal heart sounds.  Pulmonary/Chest: Effort normal and breath sounds normal. He has no wheezes.  Abdominal: Soft. There is tenderness.  Musculoskeletal: He exhibits no tenderness.  Neurological: He is alert and oriented to person, place, and time.  Skin: Skin is warm and dry.  Nursing note and vitals reviewed.    ED Treatments / Results  Labs (all labs ordered are listed, but only abnormal results are displayed) Labs Reviewed  CBC WITH DIFFERENTIAL/PLATELET - Abnormal; Notable for the following components:      Result Value   Lymphs Abs 0.6 (*)    All other components within normal limits  COMPREHENSIVE METABOLIC PANEL - Abnormal; Notable for the following components:   CO2 15 (*)    Glucose, Bld 330 (*)    BUN 22 (*)    Total Protein 8.3 (*)    AST 13 (*)    Total Bilirubin 1.4 (*)    Anion gap 18 (*)    All other components within normal limits  BLOOD GAS, VENOUS - Abnormal; Notable for the following components:   pCO2, Ven 32.8 (*)    Bicarbonate 17.1 (*)    Acid-base deficit 8.5 (*)    All other components within normal limits  CBG MONITORING, ED - Abnormal; Notable for the following components:   Glucose-Capillary 319 (*)    All other components within normal limits  I-STAT CHEM 8, ED - Abnormal; Notable for the following components:   BUN 23 (*)    Glucose, Bld 330 (*)    TCO2 16 (*)    All other components within normal limits  CBG MONITORING, ED - Abnormal; Notable for the following components:   Glucose-Capillary 211 (*)    All other components within normal limits  URINALYSIS, ROUTINE W REFLEX MICROSCOPIC  RAPID URINE DRUG SCREEN, HOSP PERFORMED    EKG None  Radiology Dg Chest 2 View  Result Date: 04/28/2018 CLINICAL DATA:  Diabetes.  Diabetic ketoacidosis EXAM: CHEST - 2 VIEW COMPARISON:  Two-view chest x-ray 11/10/2017 FINDINGS: The heart size and mediastinal  contours are within normal limits. Both lungs are clear. The visualized skeletal structures are unremarkable. IMPRESSION: Negative two view chest x-ray Electronically Signed   By: Marin Roberts M.D.   On: 04/28/2018 11:07    Procedures Procedures (including critical care time)  Medications Ordered in ED Medications  sodium chloride 0.9 % bolus 1,000 mL (1,000 mLs Intravenous New Bag/Given 04/28/18 1137)  sodium chloride 0.9 % bolus 1,000 mL ( Intravenous Stopped 04/28/18 0940)  ondansetron (ZOFRAN) injection 4 mg (4 mg Intravenous Given 04/28/18 0836)  fentaNYL (SUBLIMAZE) injection 25 mcg (25 mcg Intravenous Given 04/28/18 0841)  LORazepam (ATIVAN) injection 0.5 mg (0.5 mg Intravenous Given 04/28/18 1138)  Initial Impression / Assessment and Plan / ED Course  I have reviewed the triage vital signs and the nursing notes.  Pertinent labs & imaging results that were available during my care of the patient were reviewed by me and considered in my medical decision making (see chart for details).    Patient presents with vomiting, nausea x12 hours ago.  Patient reports "I am in DKA ".  She received 500 mg bolus during EMS transportation along with a second bag on arrival.  Given her continuous bolus along with some fentanyl and Zofran as patient seemed very uncomfortable. Patient is tachycardic on arrival along with actively vomiting.CBG was 19.  CMP showed no electrolyte abnormality CO2 decreased at 15, BUN at 22 consistent with dehydration.  Leukos at 330. Patient continues to be tachycardic during the ED course, according to his records he has a previous admission for DKA, she reports he has not used any drugs or recent sick contacts,  11:24 AM persistently tachycardic during ED visit, he is received 2 L of fluids with a sugar at 211 now I have tentatively give him an insulin drip but his sugar is not high enough in order to use a glucose stabilizer, he continuously reports nausea will  provide him with 0.5 of Ativan along with an extra liter of fluids, call placed to hospitalist for admission.  12:18 PM Spoke to Dr. Arlean Hopping, who will evaluate patient and admit.    Final Clinical Impressions(s) / ED Diagnoses   Final diagnoses:  Dehydration    ED Discharge Orders    None       Claude Manges, PA-C 04/28/18 1220    Blane Ohara, MD 04/29/18 1609

## 2018-04-28 NOTE — H&P (Signed)
[facility] History and Physical  DAMARIOUS HOLTSCLAW ZOX:[id number] DOB: 11/18/1965 DOA: 04/28/2018  Referring physician: Claude Manges, PA PCP: Marcine Matar, MD   Chief Complaint: Nausea, vomiting, abd pain  HPI: Rickey Smith is a 52 y.o. male with hx of diabetes mellitus, recurrent admissions for DKA / HONK, hx substance abuse/ cocaine in the past presented to ED today this am with c/o nausea and vomiting starting last night.  Also abd pain nonfocal.  Similar to prior admissions he had for DKA.     ED course pt showed AG 18, BS 319.  Given 2-3 L saline bolus in ED due to tachycardia. Pt got fentanyl IV, IV zofran ,IV insulin (? Not charted), and IV ativan and still wasn't feeling good, we are asked to see for admission to hospital.  F/u BS was 220.  Serum CO2 15 on admisison, AG 18.    Patient is a poor historian, very sleepy but arouses and interacts normally.  Lives alone, no insurance, no problems getting insulin, didn't take yesterday's 70/30 bid because he "was sick", then got "sicker" through the day.  Normally takes all his insulin.  deneis recent drug use, "I quit".   No job, gets help from his family for finances.   Denies any prod cough, CP, HA blurred vision, no wounds or sores no rash and no diarrhea.     EMR admits:  07/2015 - HONK, SIRS  07/2015 - prostate abscess underwent I&D, +cocaine  02/2016 - DKA  04/2016 - DKA, AKI, +/- ^trop echo neg  04/2016 - HONK, AKI  06/2016 - HONK  12/2016 - DKA, DM2 ? LADA  03/2017 - DKA        ROS  denies CP  no joint pain   no HA  no blurry vision  no rash  no diarrhea  no nausea/ vomiting  no dysuria  no difficulty voiding  no change in urine color    Past Medical History  Past Medical History:  Diagnosis Date  . CHF (congestive heart failure) (HCC)    "think I have this before" (11/11/2017)  . Diabetic peripheral neuropathy (HCC)   . Migraine    "once q month or 2" (11/11/2017)  . Type II diabetes mellitus  (HCC)    Past Surgical History  Past Surgical History:  Procedure Laterality Date  . TRANSURETHRAL RESECTION OF PROSTATE N/A 08/16/2015   Procedure: TRANSURETHRAL RESECTION DRAINAGE OF PROSTATE ABCESS;  Surgeon: Barron Alvine, MD;  Location: [facility] ORS;  Service: Urology;  Laterality: N/A;   Family History  Family History  Problem Relation Age of Onset  . Heart attack Unknown   . Prostate cancer Unknown   . Hypertension Unknown   . Hypertension Mother   . Cancer Father    Social History  reports that he quit smoking about 14 months ago. His smoking use included cigarettes. He has a 17.00 pack-year smoking history. He has never used smokeless tobacco. He reports that he drank alcohol. He reports that he has current or past drug history. Drugs: Cocaine and Heroin. Allergies  Allergies  Allergen Reactions  . Gabapentin Diarrhea and Nausea And Vomiting    Had to be hospitalized because of the reaction(s)  . Codeine Itching  . Penicillins Hives and Rash    Has patient had a PCN reaction causing immediate rash, facial/tongue/throat swelling, SOB or lightheadedness with hypotension: Yes Has patient had a PCN reaction causing severe rash involving mucus membranes or skin necrosis: Unk Has patient  had a PCN reaction that required hospitalization: Unk Has patient had a PCN reaction occurring within the last 10 years: Yes If all of the above answers are "NO", then may proceed with Cephalosporin use.    Home medications Prior to Admission medications   Medication Sig Start Date End Date Taking? Authorizing Provider  amitriptyline (ELAVIL) 75 MG tablet Take 1 tablet (75 mg total) by mouth at bedtime. 04/16/17   Vivianne Master, PA-C  insulin aspart protamine- aspart (NOVOLOG MIX 70/30) (70-30) 100 UNIT/ML injection Inject 0.2 mLs (20 Units total) into the skin 2 (two) times daily with a meal. Patient taking differently: Inject 40-45 Units into the skin 2 (two) times daily with a meal. Injects 45  units every morning and 40 every night. 11/13/17   Regalado, Jon Billings A, MD   Liver Function Tests Recent Labs  Lab 04/28/18 0906  AST 13*  ALT 15  ALKPHOS 76  BILITOT 1.4*  PROT 8.3*  ALBUMIN 4.5   No results for input(s): LIPASE, AMYLASE in the last 168 hours. CBC Recent Labs  Lab 04/28/18 0906 04/28/18 0918  WBC 7.4  --   NEUTROABS 6.5  --   HGB 13.5 15.0  HCT 43.1 44.0  MCV 86.9  --   PLT 246  --    Basic Metabolic Panel Recent Labs  Lab 04/28/18 0906 04/28/18 0918  NA 138 138  K 4.5 4.5  CL 105 107  CO2 15*  --   GLUCOSE 330* 330*  BUN 22* 23*  CREATININE 1.20 1.10  CALCIUM 9.3  --      Vitals:   04/28/18 1000 04/28/18 1030 04/28/18 1100 04/28/18 1351  BP: (!) 154/89 (!) 148/90 133/73 (!) 154/96  Pulse: (!) 114 (!) 120 (!) 124 (!) 108  Resp: (!) 23 (!) 26 15 16   Temp:    99.6 F (37.6 C)  TempSrc:    Oral  SpO2: 97% 97% 97% 100%  Weight:    67.1 kg  Height:       Exam: Gen tall slender WM, lethargic but fully appropriate when awakened, nonfocal No rash, cyanosis or gangrene Sclera anicteric, throat clear and slightly dry  No jvd or bruits Chest clear bilat to bases RRR no MRG Abd soft ntnd no mass or ascites +bs GU normal male MS no joint effusions or deformity Ext no LE or UE edema / no wounds or ulcers Neuro is alert, Ox 3 , nf     Home meds:  - amitriptyline 75 qd  - insulin aspart/ protamine aspart mix 70/30 take 45u qam and 40u qpm sq   Na 138 K 4.5 CO2 15  AG = 18  Creat 1.10  BN 23   BS 330   Alb 4.5  LFT's ok  WBC 7k   Hb 13  plt 246  EKG (independ reviewed) > sinus tach, no acute changes, 110 bpm CXR (independ reviewed) > no acute disease   Assessment: 1. Nausea/ vomiting / anion -gap metabolic acidosis - BS's aren't real high but w/ acidosis should treat w/ IV insulin for now.  Has DM2 but in past has had several admits for DKA.   2. Vol depletion - on exam, plan IVF"s, npo for now 3. Nausea/ vomiting - abdomen benign on  exam, symptomatic Rx 4. DM2 vs LADA - on 70/30 insulin bid at home 5. Hx prostatic abscess - in 2017 , had I&D. Check UA.    Plan - as above  Delano Metz D [facility] Pager [phone]   If 7PM-7AM, please contact night-coverage [email or web address] Password [email or web address] 04/28/2018, 3:29 PM

## 2018-04-28 NOTE — Progress Notes (Signed)
K. Schorr paged to see if pt could get something for sleep tonight. Waiting for orders/call back.

## 2018-04-28 NOTE — ED Notes (Signed)
Patient transported to X-ray 

## 2018-04-28 NOTE — Progress Notes (Signed)
Pt has orders to transfer to ICU. Pt transferred to ICU 07. Bedside report given to [employer]. All questions answered and no further questions at this time.

## 2018-04-28 NOTE — ED Notes (Signed)
RT notified for venous blood gas.  

## 2018-04-28 NOTE — Progress Notes (Signed)
Kirtland Bouchard Schorr paged to make aware pt was c/o headache. No tylenol ordered. Asked for Tylenol. Waiting for orders/call back.

## 2018-04-28 NOTE — ED Notes (Signed)
Phlebotomy at bedside.

## 2018-04-29 ENCOUNTER — Observation Stay (HOSPITAL_COMMUNITY): Payer: Self-pay

## 2018-04-29 DIAGNOSIS — E0811 Diabetes mellitus due to underlying condition with ketoacidosis with coma: Secondary | ICD-10-CM

## 2018-04-29 DIAGNOSIS — E860 Dehydration: Secondary | ICD-10-CM

## 2018-04-29 DIAGNOSIS — E876 Hypokalemia: Secondary | ICD-10-CM

## 2018-04-29 LAB — GLUCOSE, CAPILLARY
GLUCOSE-CAPILLARY: 109 mg/dL — AB (ref 70–99)
GLUCOSE-CAPILLARY: 127 mg/dL — AB (ref 70–99)
GLUCOSE-CAPILLARY: 147 mg/dL — AB (ref 70–99)
GLUCOSE-CAPILLARY: 175 mg/dL — AB (ref 70–99)
GLUCOSE-CAPILLARY: 226 mg/dL — AB (ref 70–99)
Glucose-Capillary: 118 mg/dL — ABNORMAL HIGH (ref 70–99)
Glucose-Capillary: 119 mg/dL — ABNORMAL HIGH (ref 70–99)
Glucose-Capillary: 161 mg/dL — ABNORMAL HIGH (ref 70–99)
Glucose-Capillary: 162 mg/dL — ABNORMAL HIGH (ref 70–99)
Glucose-Capillary: 167 mg/dL — ABNORMAL HIGH (ref 70–99)

## 2018-04-29 LAB — CBC
HEMATOCRIT: 35.6 % — AB (ref 39.0–52.0)
Hemoglobin: 11.3 g/dL — ABNORMAL LOW (ref 13.0–17.0)
MCH: 26.8 pg (ref 26.0–34.0)
MCHC: 31.7 g/dL (ref 30.0–36.0)
MCV: 84.6 fL (ref 80.0–100.0)
Platelets: 232 10*3/uL (ref 150–400)
RBC: 4.21 MIL/uL — ABNORMAL LOW (ref 4.22–5.81)
RDW: 13.8 % (ref 11.5–15.5)
WBC: 14.4 10*3/uL — ABNORMAL HIGH (ref 4.0–10.5)
nRBC: 0 % (ref 0.0–0.2)

## 2018-04-29 LAB — BASIC METABOLIC PANEL
ANION GAP: 5 (ref 5–15)
Anion gap: 10 (ref 5–15)
BUN: 15 mg/dL (ref 6–20)
BUN: 9 mg/dL (ref 6–20)
CALCIUM: 8.1 mg/dL — AB (ref 8.9–10.3)
CO2: 22 mmol/L (ref 22–32)
CO2: 20 mmol/L — AB (ref 22–32)
CREATININE: 0.55 mg/dL — AB (ref 0.61–1.24)
Calcium: 8.5 mg/dL — ABNORMAL LOW (ref 8.9–10.3)
Chloride: 110 mmol/L (ref 98–111)
Chloride: 103 mmol/L (ref 98–111)
Creatinine, Ser: 0.66 mg/dL (ref 0.61–1.24)
GFR calc Af Amer: >60 mL/min (ref >60)
GFR calc Af Amer: >60 mL/min (ref >60)
GLUCOSE: 149 mg/dL — AB (ref 70–99)
Glucose, Bld: 126 mg/dL — ABNORMAL HIGH (ref 70–99)
POTASSIUM: 3.2 mmol/L — AB (ref 3.5–5.1)
Potassium: 3.3 mmol/L — ABNORMAL LOW (ref 3.5–5.1)
SODIUM: 135 mmol/L (ref 135–145)
Sodium: 135 mmol/L (ref 135–145)

## 2018-04-29 LAB — HEMOGLOBIN A1C
Hgb A1c MFr Bld: 10.7 % — ABNORMAL HIGH (ref 4.8–5.6)
Mean Plasma Glucose: 260.39 mg/dL

## 2018-04-29 LAB — HIV ANTIBODY (ROUTINE TESTING W REFLEX): HIV SCREEN 4TH GENERATION: NONREACTIVE

## 2018-04-29 LAB — MAGNESIUM: MAGNESIUM: 2.1 mg/dL (ref 1.7–2.4)

## 2018-04-29 LAB — LIPASE, BLOOD: Lipase: 20 U/L (ref 11–51)

## 2018-04-29 MED ORDER — INSULIN NPH (HUMAN) (ISOPHANE) 100 UNIT/ML ~~LOC~~ SUSP
18.0000 [IU] | Freq: Two times a day (BID) | SUBCUTANEOUS | Status: DC
  Administered 2018-04-29: 18 [IU] via SUBCUTANEOUS
  Filled 2018-04-29 (×2): qty 10

## 2018-04-29 MED ORDER — MORPHINE SULFATE (PF) 4 MG/ML IV SOLN
4.0000 mg | Freq: Once | INTRAVENOUS | Status: AC
  Administered 2018-04-29: 4 mg via INTRAVENOUS
  Filled 2018-04-29: qty 1

## 2018-04-29 MED ORDER — PANTOPRAZOLE SODIUM 40 MG PO TBEC
40.0000 mg | DELAYED_RELEASE_TABLET | Freq: Every day | ORAL | Status: DC
  Administered 2018-04-29: 40 mg via ORAL
  Filled 2018-04-29: qty 1

## 2018-04-29 MED ORDER — IOPAMIDOL (ISOVUE-300) INJECTION 61%
100.0000 mL | Freq: Once | INTRAVENOUS | Status: AC | PRN
  Administered 2018-04-29: 100 mL via INTRAVENOUS

## 2018-04-29 MED ORDER — ONDANSETRON HCL 4 MG/2ML IJ SOLN
4.0000 mg | Freq: Four times a day (QID) | INTRAMUSCULAR | Status: DC
  Administered 2018-04-29 – 2018-05-01 (×9): 4 mg via INTRAVENOUS
  Filled 2018-04-29 (×9): qty 2

## 2018-04-29 MED ORDER — HYDRALAZINE HCL 20 MG/ML IJ SOLN
10.0000 mg | INTRAMUSCULAR | Status: DC | PRN
  Administered 2018-04-30: 10 mg via INTRAVENOUS
  Filled 2018-04-29: qty 1

## 2018-04-29 MED ORDER — POTASSIUM CHLORIDE CRYS ER 20 MEQ PO TBCR
20.0000 meq | EXTENDED_RELEASE_TABLET | Freq: Once | ORAL | Status: AC
  Administered 2018-04-29: 20 meq via ORAL
  Filled 2018-04-29: qty 1

## 2018-04-29 MED ORDER — INSULIN NPH (HUMAN) (ISOPHANE) 100 UNIT/ML ~~LOC~~ SUSP
18.0000 [IU] | Freq: Two times a day (BID) | SUBCUTANEOUS | Status: DC
  Administered 2018-04-29 – 2018-05-01 (×4): 18 [IU] via SUBCUTANEOUS
  Filled 2018-04-29 (×2): qty 10

## 2018-04-29 MED ORDER — POTASSIUM CHLORIDE IN NACL 20-0.9 MEQ/L-% IV SOLN
INTRAVENOUS | Status: DC
  Administered 2018-04-29 – 2018-05-01 (×6): via INTRAVENOUS

## 2018-04-29 MED ORDER — IOPAMIDOL (ISOVUE-300) INJECTION 61%
50.0000 mL | Freq: Once | INTRAVENOUS | Status: AC | PRN
  Administered 2018-04-29: 30 mL via ORAL

## 2018-04-29 MED ORDER — INSULIN ASPART 100 UNIT/ML ~~LOC~~ SOLN
0.0000 [IU] | Freq: Three times a day (TID) | SUBCUTANEOUS | Status: DC
  Administered 2018-04-29: 2 [IU] via SUBCUTANEOUS
  Administered 2018-04-29: 3 [IU] via SUBCUTANEOUS
  Administered 2018-04-30: 5 [IU] via SUBCUTANEOUS
  Administered 2018-04-30 (×2): 3 [IU] via SUBCUTANEOUS
  Administered 2018-05-01: 11 [IU] via SUBCUTANEOUS

## 2018-04-29 MED ORDER — PROMETHAZINE HCL 25 MG/ML IJ SOLN
12.5000 mg | Freq: Four times a day (QID) | INTRAMUSCULAR | Status: DC | PRN
  Administered 2018-04-30: 12.5 mg via INTRAVENOUS
  Filled 2018-04-29: qty 1

## 2018-04-29 MED ORDER — PROMETHAZINE HCL 25 MG/ML IJ SOLN
12.5000 mg | Freq: Once | INTRAMUSCULAR | Status: AC
  Administered 2018-04-29: 12.5 mg via INTRAVENOUS
  Filled 2018-04-29: qty 1

## 2018-04-29 MED ORDER — POTASSIUM CHLORIDE 10 MEQ/100ML IV SOLN
10.0000 meq | INTRAVENOUS | Status: AC
  Administered 2018-04-29 (×3): 10 meq via INTRAVENOUS
  Filled 2018-04-29 (×3): qty 100

## 2018-04-29 NOTE — Progress Notes (Signed)
This RN spoke with Dr. Julian Reil verbally about pts nausea and vomiting. Dr. Julian Reil wants to continue the every one hour blood sugars until pts morning labs come back to see what the anion gap is. Once the anion gap comes back- MD wants to be paged to see if pt needs to go back on drip or not.

## 2018-04-29 NOTE — Progress Notes (Signed)
Despite AG closing, and patient being off insulin gtt, patient now complaining of RUQ abd pain, nausea and dry heaves.  On my exam though, he does c/o diffuse tenderness, but no guarding and no rebound.  Repeat BMP pending for this AM.  Giving 4mg  morphine.  Ordering CT abd/pelvis  Q1H CBG checks for now.

## 2018-04-29 NOTE — Progress Notes (Signed)
AG closed, proceeding to phase 2 as per protocol:  2/3rds home basal dose: so 18 NPH BID ordered  Mod scale SSI AC.

## 2018-04-29 NOTE — Progress Notes (Signed)
Dr. Julian Reil made aware of pts BP of 178/97- also adv MD that pts BP has been increased for most of shift but no PRN medications ordered.  CT brought up po contrast for pts CT scan ordered. Peyton Najjar from CT stated pt needs to drink and will pick up in 2 hours- Adv that pt has been nauseated and vomiting and may not be able to keep contrast down. Dr. Julian Reil (sitting at nurses station) stated it was ok if pt did not get the contrast down. He still wanted the CT scan to be completed. Will try and prompt pt.   After pt was given the IV morphine- he immediately stopped moaning and stated he had heart burn.

## 2018-04-29 NOTE — Progress Notes (Signed)
Dr. Julian Reil paged about pts BMET- Co2=20, Anion Gap=5 (last anion gap @ 2030=8), Blood sugar=118 to see if insulin gtt can be d/c. Waiting for call back/orders. Will continue to monitor pt

## 2018-04-29 NOTE — Progress Notes (Signed)
After pt received Zofran @ 0330- he is actively vomiting. Dr. Julian Reil paged and made aware to see if pt could have a dose of phenergen. Waiting for call back/orders. Will continue to monitor pt

## 2018-04-29 NOTE — Progress Notes (Signed)
PROGRESS NOTE  Rickey Smith ZOX:[id number] DOB: July 30, 1965 DOA: 04/28/2018 PCP: Marcine Matar, MD  Brief History:  52 year old male with a history of insulin-dependent diabetes mellitus, cocaine abuse presenting with 1 day history of nausea and vomiting that began on 04/27/2018.  The patient is a poor historian and often deflects specific questioning.  He endorses compliance with his medications including 7/30 insulin.  He denied any new medications.  He denies any fevers, chills, chest pain, shortness breath, dysuria,, hematuria, diarrhea, hematochezia, melena.  He states that he is able to obtain his insulin.  Interestingly, the patient denies illicit drug use including cocaine.  Upon admission, the patient was noted to have a serum glucose of 330 and anion gap of 18 with ketonuria.  He was started on IV insulin and IV fluids.  However, the patient continued to have nausea and vomiting.  Assessment/Plan: DKA type 2 -patient started on IV insulin with q 1 hour CBG check and q 4 hour BMPs -pt started on aggressive fluid resuscitation -Electrolytes were monitored and repleted -transitioned to [place] insulin once anion gap closed -diet was advanced once anion gap closed -HbA1C--  Intractable vomiting and abdominal pain -due to exacerbation of gastroparesis -CT abd/pelvis -check lipase -UA neg for pyuria -pt continues to have emesis, cannot tolerate diet -clear liquids for now  Cocaine abuse -pt denies using despite urine showing positive cocaine  Hypokalemia -replete -check mag  AKI -due to volume depletion -serum creatinine peaked 1.20 -baseline creatinine 0.6-0.8     Disposition Plan:   Home in 2-3 days  Family Communication:  No Family at bedside  Consultants:    Code Status:  FULL / DNR  DVT Prophylaxis:   Heparin /  Lovenox   Procedures: As Listed in Progress Note Above  Antibiotics: None          Subjective: Patient continues to  complain of epigastric abdominal pain.  He denies any chest pain, shortness breath, cough, hemoptysis, diarrhea, hematochezia, melena, dysuria, hematuria.  He continues to have nausea and vomiting.  There is no hematemesis.  Objective: Vitals:   04/29/18 0800 04/29/18 0930 04/29/18 1130 04/29/18 1145  BP: (!) 160/90 (!) 161/99 (!) 171/94 (!) 150/85  Pulse: 100 94 (!) 102 98  Resp: 18 17 14  (!) 21  Temp: 98.8 F (37.1 C)     TempSrc: Oral     SpO2: 97% 96% 99% 97%  Weight:      Height:        Intake/Output Summary (Last 24 hours) at 04/29/2018 1159 Last data filed at 04/29/2018 0532 Gross per 24 hour  Intake 3622.63 ml  Output 1400 ml  Net 2222.63 ml   Weight change:  Exam:   General:  Pt is alert, follows commands appropriately, not in acute distress  HEENT: No icterus, No thrush, No neck mass, [place]/AT  Cardiovascular: RRR, S1/S2, no rubs, no gallops  Respiratory: CTA bilaterally, no wheezing, no crackles, no rhonchi  Abdomen: Soft/+BS, epigastric tender, non distended, no guarding  Extremities: No edema, No lymphangitis, No petechiae, No rashes, no synovitis   Data Reviewed: I have personally reviewed following labs and imaging studies Basic Metabolic Panel: Recent Labs  Lab 04/28/18 0906 04/28/18 0918 04/28/18 1711 04/28/18 2017 04/29/18 0047  NA 138 138 134* 134* 135  K 4.5 4.5 4.0 4.1 3.3*  CL 105 107 102 107 110  CO2 15*  --  18* 19* 20*  GLUCOSE 330*  330* 307* 240* 149*  BUN 22* 23* 18 17 15   CREATININE 1.20 1.10 0.97 0.82 0.66  CALCIUM 9.3  --  8.8* 8.0* 8.1*   Liver Function Tests: Recent Labs  Lab 04/28/18 0906  AST 13*  ALT 15  ALKPHOS 76  BILITOT 1.4*  PROT 8.3*  ALBUMIN 4.5   No results for input(s): LIPASE, AMYLASE in the last 168 hours. No results for input(s): AMMONIA in the last 168 hours. Coagulation Profile: No results for input(s): INR, PROTIME in the last 168 hours. CBC: Recent Labs  Lab 04/28/18 0906 04/28/18 0918  04/29/18 0406  WBC 7.4  --  14.4*  NEUTROABS 6.5  --   --   HGB 13.5 15.0 11.3*  HCT 43.1 44.0 35.6*  MCV 86.9  --  84.6  PLT 246  --  232   Cardiac Enzymes: No results for input(s): CKTOTAL, CKMB, CKMBINDEX, TROPONINI in the last 168 hours. BNP: Invalid input(s): POCBNP CBG: Recent Labs  Lab 04/29/18 0240 04/29/18 0330 04/29/18 0432 04/29/18 0522 04/29/18 0759  GLUCAP 109* 127* 167* 162* 147*   HbA1C: No results for input(s): HGBA1C in the last 72 hours. Urine analysis:    Component Value Date/Time   COLORURINE YELLOW 04/28/2018 2221   APPEARANCEUR CLEAR 04/28/2018 2221   LABSPEC 1.029 04/28/2018 2221   PHURINE 5.0 04/28/2018 2221   GLUCOSEU >=500 (A) 04/28/2018 2221   HGBUR NEGATIVE 04/28/2018 2221   BILIRUBINUR NEGATIVE 04/28/2018 2221   BILIRUBINUR neg 08/10/2015 1041   KETONESUR 80 (A) 04/28/2018 2221   PROTEINUR NEGATIVE 04/28/2018 2221   UROBILINOGEN 2.0 08/10/2015 1041   UROBILINOGEN 0.2 10/23/2014 0817   NITRITE NEGATIVE 04/28/2018 2221   LEUKOCYTESUR NEGATIVE 04/28/2018 2221   Sepsis Labs: @LABRCNTIP (procalcitonin:4,lacticidven:4) ) Recent Results (from the past 240 hour(s))  MRSA PCR Screening     Status: None   Collection Time: 04/28/18  5:23 PM  Result Value Ref Range Status   MRSA by PCR NEGATIVE NEGATIVE Final    Comment:        The GeneXpert MRSA Assay (FDA approved for NASAL specimens only), is one component of a comprehensive MRSA colonization surveillance program. It is not intended to diagnose MRSA infection nor to guide or monitor treatment for MRSA infections. Performed at [facility], [address] [place], [place] [address]      Scheduled Meds: . enoxaparin (LOVENOX) injection  40 mg Subcutaneous Q24H  . insulin aspart  0-15 Units Subcutaneous TID WC  . insulin NPH Human  18 Units Subcutaneous BID AC & HS   Continuous Infusions: . 0.9 % NaCl with KCl 20 mEq / L      Procedures/Studies: Dg Chest 2 View  Result Date:  04/28/2018 CLINICAL DATA:  Diabetes.  Diabetic ketoacidosis EXAM: CHEST - 2 VIEW COMPARISON:  Two-view chest x-ray 11/10/2017 FINDINGS: The heart size and mediastinal contours are within normal limits. Both lungs are clear. The visualized skeletal structures are unremarkable. IMPRESSION: Negative two view chest x-ray Electronically Signed   By: Marin Roberts M.D.   On: 04/28/2018 11:07    Catarina Hartshorn, DO  [facility] Pager [phone]  If 7PM-7AM, please contact night-coverage [email or web address] Password [email or web address] 04/29/2018, 11:59 AM   LOS: 0 days

## 2018-04-29 NOTE — Progress Notes (Signed)
Pt yelling out in room- stating his abdomen feels like its splitting open. Dr. Julian Reil on floor already. Dr. Julian Reil in room with pt to assess. Will continue to monitor

## 2018-04-29 NOTE — Progress Notes (Signed)
NPH 18 units given per MD order to transition pt off of insulin gtt. Will turn drip off at 0500- 2 hours after insulin given per MD order and protocol

## 2018-04-29 NOTE — Progress Notes (Signed)
Pts BMP results still are not back after being drawn at 0400. This RN has called lab several times and Sherri in the lab states the results are not back. Adv the BMP results are not showing up on my end- states she will fax them.  CO2-26 Anion gap-7 Dr. Julian Reil paged with results. Can now check blood sugars 4x daily and d/c Insulin gtt and IVF.

## 2018-04-29 NOTE — Progress Notes (Signed)
RN adv Dr. Julian Reil that pt has barely drank any po contrast for his CT.

## 2018-04-30 ENCOUNTER — Encounter (HOSPITAL_COMMUNITY): Payer: Self-pay | Admitting: Gastroenterology

## 2018-04-30 DIAGNOSIS — F1490 Cocaine use, unspecified, uncomplicated: Secondary | ICD-10-CM

## 2018-04-30 DIAGNOSIS — R933 Abnormal findings on diagnostic imaging of other parts of digestive tract: Secondary | ICD-10-CM

## 2018-04-30 LAB — BASIC METABOLIC PANEL
ANION GAP: 9 (ref 5–15)
BUN: 7 mg/dL (ref 6–20)
CALCIUM: 8.1 mg/dL — AB (ref 8.9–10.3)
CO2: 22 mmol/L (ref 22–32)
Chloride: 102 mmol/L (ref 98–111)
Creatinine, Ser: 0.68 mg/dL (ref 0.61–1.24)
GFR calc Af Amer: >60 mL/min (ref >60)
GLUCOSE: 228 mg/dL — AB (ref 70–99)
Potassium: 3.7 mmol/L (ref 3.5–5.1)
SODIUM: 133 mmol/L — AB (ref 135–145)

## 2018-04-30 LAB — GLUCOSE, CAPILLARY
GLUCOSE-CAPILLARY: 174 mg/dL — AB (ref 70–99)
Glucose-Capillary: 193 mg/dL — ABNORMAL HIGH (ref 70–99)
Glucose-Capillary: 209 mg/dL — ABNORMAL HIGH (ref 70–99)
Glucose-Capillary: 263 mg/dL — ABNORMAL HIGH (ref 70–99)

## 2018-04-30 LAB — C-PEPTIDE

## 2018-04-30 MED ORDER — TRAMADOL HCL 50 MG PO TABS
50.0000 mg | ORAL_TABLET | Freq: Four times a day (QID) | ORAL | Status: DC | PRN
  Administered 2018-04-30: 50 mg via ORAL
  Filled 2018-04-30: qty 1

## 2018-04-30 MED ORDER — INSULIN ASPART 100 UNIT/ML ~~LOC~~ SOLN
3.0000 [IU] | Freq: Three times a day (TID) | SUBCUTANEOUS | Status: DC
  Administered 2018-05-01 (×2): 3 [IU] via SUBCUTANEOUS

## 2018-04-30 MED ORDER — DIPHENHYDRAMINE HCL 50 MG/ML IJ SOLN
25.0000 mg | Freq: Once | INTRAMUSCULAR | Status: AC
  Administered 2018-04-30: 25 mg via INTRAVENOUS
  Filled 2018-04-30: qty 1

## 2018-04-30 MED ORDER — PROCHLORPERAZINE EDISYLATE 10 MG/2ML IJ SOLN
10.0000 mg | Freq: Once | INTRAMUSCULAR | Status: AC
  Administered 2018-04-30: 10 mg via INTRAVENOUS
  Filled 2018-04-30: qty 2

## 2018-04-30 MED ORDER — SUCRALFATE 1 GM/10ML PO SUSP
1.0000 g | Freq: Three times a day (TID) | ORAL | Status: DC
  Administered 2018-04-30 – 2018-05-01 (×5): 1 g via ORAL
  Filled 2018-04-30 (×5): qty 10

## 2018-04-30 MED ORDER — KETOROLAC TROMETHAMINE 30 MG/ML IJ SOLN
30.0000 mg | Freq: Once | INTRAMUSCULAR | Status: AC
  Administered 2018-04-30: 30 mg via INTRAVENOUS
  Filled 2018-04-30: qty 1

## 2018-04-30 MED ORDER — PANTOPRAZOLE SODIUM 40 MG PO TBEC
40.0000 mg | DELAYED_RELEASE_TABLET | Freq: Two times a day (BID) | ORAL | Status: DC
  Administered 2018-04-30 – 2018-05-01 (×3): 40 mg via ORAL
  Filled 2018-04-30 (×3): qty 1

## 2018-04-30 NOTE — Progress Notes (Signed)
Alert and oriented x4. No complaints currently of pain, shortness of breath, chest pain, dizziness, nausea or vomiting. Patient up out of bed with supervision, gait steady. Patient tolerated PO meds and diet well. Appetite good. Nurse to nurse report called and given to [facility] on 300. Patient aware of being transferred to room 338. Patient transferred by wheelchair with all belongings.

## 2018-04-30 NOTE — Progress Notes (Signed)
This patient has received 52ml's of IV Isovue 300 contrast extravasation into right ac during a CT Abdomen and Pelvis with contrast  The exam was performed on 04/29/2018 @ 1010  Site / affected area assessed by Dr Ulyses Southward

## 2018-04-30 NOTE — Progress Notes (Addendum)
Inpatient Diabetes Program Recommendations  AACE/ADA: New Consensus Statement on Inpatient Glycemic Control (2019)  Target Ranges:  Prepandial:   less than 140 mg/dL      Peak postprandial:   less than 180 mg/dL (1-2 hours)      Critically ill patients:  140 - 180 mg/dL  Results for NORVILLE, DANI (MRN [id number]) as of 04/30/2018 07:35  Ref. Range 04/30/2018 04:09  Glucose Latest Ref Range: 70 - 99 mg/dL 782 (H)   Results for LENORRIS, KARGER (MRN [id number]) as of 04/30/2018 07:35  Ref. Range 04/29/2018 05:22 04/29/2018 07:59 04/29/2018 12:06 04/29/2018 16:45 04/29/2018 21:09  Glucose-Capillary Latest Ref Range: 70 - 99 mg/dL 578 (H) 469 (H) 629 (H) 175 (H) 226 (H)   Results for REAGEN, HABERMAN (MRN [id number]) as of 04/30/2018 07:35  Ref. Range 06/01/2017 19:36 04/29/2018 12:29  Hemoglobin A1C Latest Ref Range: 4.8 - 5.6 % 11.2 (H) 10.7 (H)   Review of Glycemic Control  Outpatient Diabetes medications: 70/30 45 units QAM, 70/30 40 units QPM Current orders for Inpatient glycemic control: NPH 18 units BID, Novolog 0-15 units TID with meals  Inpatient Diabetes Program Recommendations: Correction (SSI): Bedtime glucose 226 mg/dl on 01/0/27 and lab glucose 228 mg/dl this morning.  Please consider ordering Novolog 0-5 units for bedtime correction. Insulin - Meal Coverage: Once diet is advanced and patient is eating at least 50% of meals, please consider ordering Novolog 3 units TID with meals. HgbA1C: A1C 10.7 % on 04/29/18 indicating an average glucose of 260 mg/dl over the past 2-3 months.  Addendum 04/30/18@13 :36-Spoke with patient over the phone about diabetes and home regimen for diabetes control. Patient reports that he is followed by [facility] and [facility] for diabetes management but he notes that he has not been in a while.  Patient reports that he is buying Novolin 70/30 from [employer] over the counter and he is taking 70/30 45 units QAM and 70/30 40 units  QPM.   Patient states that he checks his glucose 3-4  times per day and that it is usually in the 100's mg/dl.   Inquired about prior A1C and patient reports that he does not recall his last A1C value. Discussed A1C results (10.7% on 04/29/18) and explained that his current A1C indicates an average glucose of 260 mg/dl over the past 2-3 months. Informed patient that current A1C results do not match reported glucose in the 100's mg/dl at home and inquired if patient had any insight as to why there are do not correlate.  Patient states he does not know why the A1C is high because his glucose is always in the 100's mg/dl.  Discussed glucose and A1C goals. Discussed importance of checking CBGs and maintaining good CBG control to prevent long-term and short-term complications. Stressed to the patient the importance of improving glycemic control to prevent further complications from uncontrolled diabetes.  Encouraged patient to make a follow up appointment at [facility] and be sure to take his glucometer or written glucose trends with him to his appointment.  Explained how the doctor he follows up with can use the information to continue to make insulin adjustments if needed. Patient verbalized understanding of information discussed and he states that he has no further questions at this time related to diabetes.  Thanks, Orlando Penner, RN, MSN, CDE Diabetes Coordinator Inpatient Diabetes Program [phone] (Team Pager from 8am to 5pm)

## 2018-04-30 NOTE — Consult Note (Signed)
Referring Provider: Catarina Hartshorn, MD Primary Care Physician:  Marcine Matar, MD Primary Gastroenterologist:  Roetta Sessions, MD (previously unassigned)  Reason for Consultation: Esophageal wall thickening  HPI: Rickey Smith is a 51 y.o. male diabetes mellitus, recurrent admissions for DKA, history of substance abuse/cocaine and heroin with positive urine drug screen currently (although patient declines any illicit drug use in several months) who presented to the emergency department via EMS with complaints of abdominal pain and vomiting and "I am in DKA".    Upon presentation he had anion gap metabolic acidosis. CT abdomen pelvis with contrast yesterday showing circumferential wall thickening in the distal esophagus, no gallstones or gallbladder wall thickening, pancreas unremarkable, marked distention of the urinary bladder.  Labs pertinent for positive drug screen, cocaine and opiates.  Hemoglobin A1c 10.7.  LFTs normal except for total bilirubin of 1.4.  Glucose 330 on admission.  BUN 22, creatinine 1.2.  White blood cell count 7400, hemoglobin 13.5, platelets 246,000.  Patient reports acute onset epigastric pain, vomiting which began 12 hours prior to presentation.  He is uninsured but states he is able to get his insulin.  A1c over 10.  He reports prior heavy alcohol abuse but has been sober for 10 years.  He reports last illicit drug use over a month ago.  Typically does not have abdominal pain or vomiting on a chronic basis.  Typically does not have heartburn.  Denies dysphagia.  Bowel movements regular.  No blood in the stool or melena.  Denies NSAID or aspirin use.   Prior to Admission medications   Medication Sig Start Date End Date Taking? Authorizing Provider  amitriptyline (ELAVIL) 75 MG tablet Take 1 tablet (75 mg total) by mouth at bedtime. 04/16/17   Vivianne Master, PA-C  insulin aspart protamine- aspart (NOVOLOG MIX 70/30) (70-30) 100 UNIT/ML injection Inject 0.2 mLs (20  Units total) into the skin 2 (two) times daily with a meal. Patient taking differently: Inject 40-45 Units into the skin 2 (two) times daily with a meal. Injects 45 units every morning and 40 every night. 11/13/17   Regalado, Prentiss Bells, MD    Current Facility-Administered Medications  Medication Dose Route Frequency Provider Last Rate Last Dose  . 0.9 % NaCl with KCl 20 mEq/ L  infusion   Intravenous Continuous Catarina Hartshorn, MD 125 mL/hr at 04/30/18 0737    . acetaminophen (TYLENOL) tablet 650 mg  650 mg Oral Q6H PRN Schorr, Roma Kayser, NP   650 mg at 04/30/18 0402  . enoxaparin (LOVENOX) injection 40 mg  40 mg Subcutaneous Q24H Delano Metz, MD   40 mg at 04/29/18 1216  . hydrALAZINE (APRESOLINE) injection 10-20 mg  10-20 mg Intravenous Q4H PRN Hillary Bow, DO      . insulin aspart (novoLOG) injection 0-15 Units  0-15 Units Subcutaneous TID WC Hillary Bow, DO   3 Units at 04/29/18 1648  . insulin NPH Human (HUMULIN N,NOVOLIN N) injection 18 Units  18 Units Subcutaneous BID Denese Killings, MD   18 Units at 04/29/18 1645  . ondansetron (ZOFRAN) injection 4 mg  4 mg Intravenous Q6H Catarina Hartshorn, MD   4 mg at 04/30/18 0541  . pantoprazole (PROTONIX) EC tablet 40 mg  40 mg Oral Daily Tat, David, MD   40 mg at 04/29/18 1439  . promethazine (PHENERGAN) injection 12.5 mg  12.5 mg Intravenous Q6H PRN Tat, Onalee Hua, MD   12.5 mg at 04/30/18 0359  . traZODone (DESYREL) tablet  50 mg  50 mg Oral QHS PRN Schorr, Roma Kayser, NP   50 mg at 04/29/18 2116    Allergies as of 04/28/2018 - Review Complete 04/28/2018  Allergen Reaction Noted  . Gabapentin Diarrhea and Nausea And Vomiting 07/31/2014  . Codeine Itching 01/24/2013  . Penicillins Hives and Rash 11/10/2017    Past Medical History:  Diagnosis Date  . CHF (congestive heart failure) (HCC)    "think I have this before" (11/11/2017)  . Diabetic peripheral neuropathy (HCC)   . Migraine    "once q month or 2" (11/11/2017)  . Type II diabetes  mellitus (HCC)     Past Surgical History:  Procedure Laterality Date  . TRANSURETHRAL RESECTION OF PROSTATE N/A 08/16/2015   Procedure: TRANSURETHRAL RESECTION DRAINAGE OF PROSTATE ABCESS;  Surgeon: Barron Alvine, MD;  Location: [facility]S;  Service: Urology;  Laterality: N/A;    Family History  Problem Relation Age of Onset  . Heart attack Unknown   . Prostate cancer Unknown   . Hypertension Unknown   . Hypertension Mother   . Cancer Father     Social History   Socioeconomic History  . Marital status: Married    Spouse name: Not on file  . Number of children: Not on file  . Years of education: 62  . Highest education level: 12th grade  Occupational History  . Occupation: unemployed  Social Needs  . Financial resource strain: Not very hard  . Food insecurity:    Worry: Never true    Inability: Never true  . Transportation needs:    Medical: No    Non-medical: No  Tobacco Use  . Smoking status: Former Smoker    Packs/day: 0.50    Years: 34.00    Pack years: 17.00    Types: Cigarettes    Last attempt to quit: 02/2017    Years since quitting: 1.1  . Smokeless tobacco: Never Used  Substance and Sexual Activity  . Alcohol use: Not Currently  . Drug use: Yes    Types: Cocaine, Heroin    Comment: 11/11/2017   . Sexual activity: Not Currently  Lifestyle  . Physical activity:    Days per week: 0 days    Minutes per session: 0 min  . Stress: Only a little  Relationships  . Social connections:    Talks on phone: Once a week    Gets together: Once a week    Attends religious service: Never    Active member of club or organization: No    Attends meetings of clubs or organizations: Never    Relationship status: Divorced  . Intimate partner violence:    Fear of current or ex partner: No    Emotionally abused: No    Physically abused: No    Forced sexual activity: No  Other Topics Concern  . 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     ROS:  General: Negative for anorexia, weight loss, fever, chills, fatigue, weakness. Eyes: Negative for vision changes.  ENT: Negative for hoarseness, difficulty swallowing , nasal congestion. CV: Negative for chest pain, angina, palpitations, dyspnea on exertion, peripheral edema.  Respiratory: Negative for dyspnea at rest, dyspnea on exertion, cough, sputum, wheezing.  GI: See history of present illness. GU:  Negative  for dysuria, hematuria, urinary incontinence, urinary frequency, nocturnal urination.  MS: Negative for joint pain, low back pain.  Derm: Negative for rash or itching.  Neuro: Negative for weakness, abnormal sensation, seizure, frequent headaches, memory loss, confusion.  Psych: Negative for anxiety, depression, suicidal ideation, hallucinations.  Endo: Negative for unusual weight change.  Heme: Negative for bruising or bleeding. Allergy: Negative for rash or hives.       Physical Examination: Vital signs in last 24 hours: Temp:  [98.1 F (36.7 C)-100.1 F (37.8 C)] 98.8 F (37.1 C) (11/07 0747) Pulse Rate:  [87-102] 90 (11/07 0400) Resp:  [13-21] 21 (11/07 0400) BP: (138-171)/(78-99) 149/82 (11/07 0400) SpO2:  [96 %-99 %] 97 % (11/07 0400) Weight:  [70.7 kg] 70.7 kg (11/07 0500) Last BM Date: 04/27/18  General: Well-nourished, well-developed in no acute distress.  Multiple tattoos. Head: Normocephalic, atraumatic.   Eyes: Conjunctiva pink, no icterus. Mouth: Oropharyngeal mucosa moist and pink , no lesions erythema or exudate.  Poor dentention Neck: Supple without thyromegaly, masses, or lymphadenopathy.  Lungs: Clear to auscultation bilaterally.  Heart: Regular rate and rhythm, no murmurs rubs or gallops.  Abdomen: Bowel sounds are normal, mild epigastric/right upper quadrant tenderness, nondistended, no hepatosplenomegaly  or masses, no abdominal bruits or    hernia , no rebound or guarding.   Rectal: Not performed Extremities: No lower extremity edema, clubbing, deformity.  Neuro: Alert and oriented x 4 , grossly normal neurologically.  Skin: Warm and dry, no rash or jaundice.   Psych: Alert and cooperative, normal mood and affect.        Intake/Output from previous day: 11/06 0701 - 11/07 0700 In: 2492.7 [P.O.:240; I.V.:2190.8; IV Piggyback:62] Out: 2000 [Urine:2000] Intake/Output this shift: Total I/O In: 129.2 [I.V.:129.2] Out: -   Lab Results: CBC Recent Labs    04/28/18 0906 04/28/18 0918 04/29/18 0406  WBC 7.4  --  14.4*  HGB 13.5 15.0 11.3*  HCT 43.1 44.0 35.6*  MCV 86.9  --  84.6  PLT 246  --  232   BMET Recent Labs    04/29/18 0047 04/29/18 1229 04/30/18 0409  NA 135 135 133*  K 3.3* 3.2* 3.7  CL 110 103 102  CO2 20* 22 22  GLUCOSE 149* 126* 228*  BUN 15 9 7   CREATININE 0.66 0.55* 0.68  CALCIUM 8.1* 8.5* 8.1*   LFT Recent Labs    04/28/18 0906  BILITOT 1.4*  ALKPHOS 76  AST 13*  ALT 15  PROT 8.3*  ALBUMIN 4.5    Lipase Recent Labs    04/29/18 1229  LIPASE 20    PT/INR No results for input(s): LABPROT, INR in the last 72 hours.    Imaging Studies: Dg Chest 2 View  Result Date: 04/28/2018 CLINICAL DATA:  Diabetes.  Diabetic ketoacidosis EXAM: CHEST - 2 VIEW COMPARISON:  Two-view chest x-ray 11/10/2017 FINDINGS: The heart size and mediastinal contours are within normal limits. Both lungs are clear. The visualized skeletal structures are unremarkable. IMPRESSION: Negative two view chest x-ray Electronically Signed   By: Marin Roberts M.D.   On: 04/28/2018 11:07   Ct Abdomen Pelvis W Contrast  Result Date: 04/29/2018 CLINICAL DATA:  Mid to lower abdominal pain since yesterday. Nausea and diarrhea. EXAM: CT ABDOMEN AND PELVIS WITH CONTRAST TECHNIQUE: Multidetector CT imaging of the abdomen and pelvis was performed using the standard protocol following  bolus administration of intravenous contrast. CONTRAST:  ISOVUE-300 IOPAMIDOL (ISOVUE-300) INJECTION 61% COMPARISON:  02/11/2016 FINDINGS: Lower chest: Circumferential  wall thickening noted distal esophagus 6 mm posterior right lower lobe nodule (1/5) unchanged since prior study. Right middle lobe subpleural nodule (9/5) is also stable. More than 2 years of imaging stability in these lesions is compatible with benign etiology. Hepatobiliary: Small low-density lesions in the dome of the liver are stable. Ill-defined 12 mm hypoattenuating focus in the medial right liver (15/2) is unchanged. Liver otherwise unremarkable. There is no evidence for gallstones, gallbladder wall thickening, or pericholecystic fluid. No intrahepatic or extrahepatic biliary dilation. Pancreas: No focal mass lesion. No dilatation of the main duct. No intraparenchymal cyst. No peripancreatic edema. Spleen: Small hypoattenuating lesion in the dome of the spleen is stable. Adrenals/Urinary Tract: 18 mm left adrenal nodule is stable. 14 mm right adrenal nodule is unchanged. Cortical scarring noted inferiorly in the right kidney. Stable 15 mm cyst upper pole left kidney. No evidence for hydroureter. The urinary bladder is markedly distended. Stomach/Bowel: Stomach is nondistended. No gastric wall thickening. No evidence of outlet obstruction. Duodenum is normally positioned as is the ligament of Treitz. No small bowel wall thickening. No small bowel dilatation. The terminal ileum is normal. The appendix is normal. No gross colonic mass. No colonic wall thickening. No substantial diverticular change. Vascular/Lymphatic: There is abdominal aortic atherosclerosis without aneurysm. There is no gastrohepatic or hepatoduodenal ligament lymphadenopathy. No intraperitoneal or retroperitoneal lymphadenopathy. No pelvic sidewall lymphadenopathy. Reproductive: The prostate gland and seminal vesicles have normal imaging features. Other: No  intraperitoneal free fluid. Musculoskeletal: Small bilateral groin hernias contain only fat. No worrisome lytic or sclerotic osseous abnormality. IMPRESSION: Circumferential wall thickening distal esophagus. 1. Circumferential esophageal wall thickening. Imaging features likely related to esophagitis although neoplasm can have this appearance. 2. Marked distention of the urinary bladder. Urinary retention not excluded. 3. Stable low-density lesions in the liver and spleen since 02/11/2016, consistent with benign etiology. 4. Stable bilateral adrenal nodules suggesting adenomas. 5. Small bilateral groin hernias contain only fat. 6.  Aortic Atherosclerois (ICD10-170.0) Electronically Signed   By: Kennith Center M.D.   On: 04/29/2018 12:01  [4 week]   Impression: 52 year old gentleman with history of diabetes mellitus, multiple admissions for DKA's who presents with acute onset upper abdominal pain, vomiting.  Acute vomiting and abdominal pain likely related to DKA's and/or drug use.  CT without any explanation for abdominal pain or vomiting.  He did have circumferential wall thickening of the distal esophagus likely esophagitis although neoplasm cannot be excluded.  Patient has remote history of alcohol abuse.  He has a current positive urine drug screen for opiates and cocaine.  He would require deep sedation for an upper endoscopy, however given positive drug screen (cocaine) he is not be a candidate due to increased risk of anesthesia.   Plan: 1. Increase pantoprazole to 40 mg twice daily before meals. 2. Add 10-day course of Carafate 1 g QAC/QHS. 3. Upper endoscopy as outpatient when patient has a negative drug screen.   We would like to thank you for the opportunity to participate in the care of Rickey Smith.  Leanna Battles. Dixon Boos [facility] [phone] 11/7/20198:55 AM     LOS: 1 day

## 2018-04-30 NOTE — Progress Notes (Signed)
PROGRESS NOTE  Rickey Smith:[id number] DOB: July 21, 1965 DOA: 04/28/2018 PCP: Marcine Matar, MD   Brief History:  52 year old male with a history of insulin-dependent diabetes mellitus, cocaine abuse presenting with 1 day history of nausea and vomiting that began on 04/27/2018.  The patient is a poor historian and often deflects specific questioning.  He endorses compliance with his medications including 7/30 insulin.  He denied any new medications.  He denies any fevers, chills, chest pain, shortness breath, dysuria,, hematuria, diarrhea, hematochezia, melena.  He states that he is able to obtain his insulin.  Interestingly, the patient denies illicit drug use including cocaine.  Upon admission, the patient was noted to have a serum glucose of 330 and anion gap of 18 with ketonuria.  He was started on IV insulin and IV fluids.  However, the patient continued to have nausea and vomiting.  Assessment/Plan: DKA type 2 -patient started on IV insulin with q 1 hour CBG check and q 4 hour BMPs -pt started on aggressive fluid resuscitation -Electrolytes were monitored and repleted -transitioned to [place] insulin once anion gap closed -diet was advanced once anion gap closed -increased NPH to 18 units bid -HbA1C--10.7 -C-peptide <0.1  Intractable vomiting and abdominal pain -due to exacerbation of gastroparesis -CT abd/pelvis--circumferential esophageal wall thickening -check lipase--20 -UA neg for pyuria -pt continues to have emesis, cannot tolerate diet -advance to carb modified diet  Esophagitis -GI consulted>>EGD outpt once cocaine cleared from system -increase protonix to bid  -added carafate with meals  Cocaine abuse -pt denies using despite urine showing positive cocaine  Hypokalemia -replete -check mag--2.1  AKI -due to volume depletion -serum creatinine peaked 1.20 -baseline creatinine 0.6-0.8     Disposition Plan:   Home 11/8 if stable Family  Communication:  No Family at bedside  Consultants: GI  Code Status:  FULL / DNR  DVT Prophylaxis:  [place] Heparin / [place] Lovenox   Procedures: As Listed in Progress Note Above  Antibiotics: None       Subjective: Pt still intermittently c/o upper abd pain.  N/v are better.  Denies f/c, cp, sob, diarrhea, dysuria  Objective: Vitals:   04/30/18 0000 04/30/18 0400 04/30/18 0500 04/30/18 0747  BP:  (!) 149/82    Pulse:  90    Resp:  (!) 21    Temp: 99.1 F (37.3 C) 99 F (37.2 C)  98.8 F (37.1 C)  TempSrc: Oral Oral  Oral  SpO2:  97%    Weight:   70.7 kg   Height:        Intake/Output Summary (Last 24 hours) at 04/30/2018 1801 Last data filed at 04/30/2018 1500 Gross per 24 hour  Intake 3062.5 ml  Output 2575 ml  Net 487.5 ml   Weight change: -6.412 kg Exam:   General:  Pt is alert, follows commands appropriately, not in acute distress  HEENT: No icterus, No thrush, No neck mass, [place]/AT  Cardiovascular: RRR, S1/S2, no rubs, no gallops  Respiratory: CTA bilaterally, no wheezing, no crackles, no rhonchi  Abdomen: Soft/+BS, dpigastric tender, non distended, no guarding  Extremities: No edema, No lymphangitis, No petechiae, No rashes, no synovitis   Data Reviewed: I have personally reviewed following labs and imaging studies Basic Metabolic Panel: Recent Labs  Lab 04/28/18 1711 04/28/18 2017 04/29/18 0047 04/29/18 1229 04/30/18 0409  NA 134* 134* 135 135 133*  K 4.0 4.1 3.3* 3.2* 3.7  CL 102 107 110 103  102  CO2 18* 19* 20* 22 22  GLUCOSE 307* 240* 149* 126* 228*  BUN 18 17 15 9 7   CREATININE 0.97 0.82 0.66 0.55* 0.68  CALCIUM 8.8* 8.0* 8.1* 8.5* 8.1*  MG  --   --   --  2.1  --    Liver Function Tests: Recent Labs  Lab 04/28/18 0906  AST 13*  ALT 15  ALKPHOS 76  BILITOT 1.4*  PROT 8.3*  ALBUMIN 4.5   Recent Labs  Lab 04/29/18 1229  LIPASE 20   No results for input(s): AMMONIA in the last 168 hours. Coagulation Profile: No  results for input(s): INR, PROTIME in the last 168 hours. CBC: Recent Labs  Lab 04/28/18 0906 04/28/18 0918 04/29/18 0406  WBC 7.4  --  14.4*  NEUTROABS 6.5  --   --   HGB 13.5 15.0 11.3*  HCT 43.1 44.0 35.6*  MCV 86.9  --  84.6  PLT 246  --  232   Cardiac Enzymes: No results for input(s): CKTOTAL, CKMB, CKMBINDEX, TROPONINI in the last 168 hours. BNP: Invalid input(s): POCBNP CBG: Recent Labs  Lab 04/29/18 1645 04/29/18 2109 04/30/18 0746 04/30/18 1211 04/30/18 1705  GLUCAP 175* 226* 193* 209* 174*   HbA1C: Recent Labs    04/29/18 1229  HGBA1C 10.7*   Urine analysis:    Component Value Date/Time   COLORURINE YELLOW 04/28/2018 2221   APPEARANCEUR CLEAR 04/28/2018 2221   LABSPEC 1.029 04/28/2018 2221   PHURINE 5.0 04/28/2018 2221   GLUCOSEU >=500 (A) 04/28/2018 2221   HGBUR NEGATIVE 04/28/2018 2221   BILIRUBINUR NEGATIVE 04/28/2018 2221   BILIRUBINUR neg 08/10/2015 1041   KETONESUR 80 (A) 04/28/2018 2221   PROTEINUR NEGATIVE 04/28/2018 2221   UROBILINOGEN 2.0 08/10/2015 1041   UROBILINOGEN 0.2 10/23/2014 0817   NITRITE NEGATIVE 04/28/2018 2221   LEUKOCYTESUR NEGATIVE 04/28/2018 2221   Sepsis Labs: @LABRCNTIP (procalcitonin:4,lacticidven:4) ) Recent Results (from the past 240 hour(s))  MRSA PCR Screening     Status: None   Collection Time: 04/28/18  5:23 PM  Result Value Ref Range Status   MRSA by PCR NEGATIVE NEGATIVE Final    Comment:        The GeneXpert MRSA Assay (FDA approved for NASAL specimens only), is one component of a comprehensive MRSA colonization surveillance program. It is not intended to diagnose MRSA infection nor to guide or monitor treatment for MRSA infections. Performed at [facility], [address] [place], [place] [address]      Scheduled Meds: . enoxaparin (LOVENOX) injection  40 mg Subcutaneous Q24H  . insulin aspart  0-15 Units Subcutaneous TID WC  . insulin NPH Human  18 Units Subcutaneous BID AC  . ondansetron  (ZOFRAN) IV  4 mg Intravenous Q6H  . pantoprazole  40 mg Oral BID AC  . sucralfate  1 g Oral TID WC & HS   Continuous Infusions: . 0.9 % NaCl with KCl 20 mEq / L 125 mL/hr at 04/30/18 1143    Procedures/Studies: Dg Chest 2 View  Result Date: 04/28/2018 CLINICAL DATA:  Diabetes.  Diabetic ketoacidosis EXAM: CHEST - 2 VIEW COMPARISON:  Two-view chest x-ray 11/10/2017 FINDINGS: The heart size and mediastinal contours are within normal limits. Both lungs are clear. The visualized skeletal structures are unremarkable. IMPRESSION: Negative two view chest x-ray Electronically Signed   By: Marin Roberts M.D.   On: 04/28/2018 11:07   Ct Abdomen Pelvis W Contrast  Result Date: 04/29/2018 CLINICAL DATA:  Mid to lower abdominal  pain since yesterday. Nausea and diarrhea. EXAM: CT ABDOMEN AND PELVIS WITH CONTRAST TECHNIQUE: Multidetector CT imaging of the abdomen and pelvis was performed using the standard protocol following bolus administration of intravenous contrast. CONTRAST:  ISOVUE-300 IOPAMIDOL (ISOVUE-300) INJECTION 61% COMPARISON:  02/11/2016 FINDINGS: Lower chest: Circumferential wall thickening noted distal esophagus 6 mm posterior right lower lobe nodule (1/5) unchanged since prior study. Right middle lobe subpleural nodule (9/5) is also stable. More than 2 years of imaging stability in these lesions is compatible with benign etiology. Hepatobiliary: Small low-density lesions in the dome of the liver are stable. Ill-defined 12 mm hypoattenuating focus in the medial right liver (15/2) is unchanged. Liver otherwise unremarkable. There is no evidence for gallstones, gallbladder wall thickening, or pericholecystic fluid. No intrahepatic or extrahepatic biliary dilation. Pancreas: No focal mass lesion. No dilatation of the main duct. No intraparenchymal cyst. No peripancreatic edema. Spleen: Small hypoattenuating lesion in the dome of the spleen is stable. Adrenals/Urinary Tract: 18 mm left  adrenal nodule is stable. 14 mm right adrenal nodule is unchanged. Cortical scarring noted inferiorly in the right kidney. Stable 15 mm cyst upper pole left kidney. No evidence for hydroureter. The urinary bladder is markedly distended. Stomach/Bowel: Stomach is nondistended. No gastric wall thickening. No evidence of outlet obstruction. Duodenum is normally positioned as is the ligament of Treitz. No small bowel wall thickening. No small bowel dilatation. The terminal ileum is normal. The appendix is normal. No gross colonic mass. No colonic wall thickening. No substantial diverticular change. Vascular/Lymphatic: There is abdominal aortic atherosclerosis without aneurysm. There is no gastrohepatic or hepatoduodenal ligament lymphadenopathy. No intraperitoneal or retroperitoneal lymphadenopathy. No pelvic sidewall lymphadenopathy. Reproductive: The prostate gland and seminal vesicles have normal imaging features. Other: No intraperitoneal free fluid. Musculoskeletal: Small bilateral groin hernias contain only fat. No worrisome lytic or sclerotic osseous abnormality. IMPRESSION: Circumferential wall thickening distal esophagus. 1. Circumferential esophageal wall thickening. Imaging features likely related to esophagitis although neoplasm can have this appearance. 2. Marked distention of the urinary bladder. Urinary retention not excluded. 3. Stable low-density lesions in the liver and spleen since 02/11/2016, consistent with benign etiology. 4. Stable bilateral adrenal nodules suggesting adenomas. 5. Small bilateral groin hernias contain only fat. 6.  Aortic Atherosclerois (ICD10-170.0) Electronically Signed   By: Kennith Center M.D.   On: 04/29/2018 12:01    Catarina Hartshorn, DO  [facility] Pager [phone]  If 7PM-7AM, please contact night-coverage [email or web address] Password [email or web address] 04/30/2018, 6:01 PM   LOS: 1 day

## 2018-05-01 DIAGNOSIS — E876 Hypokalemia: Secondary | ICD-10-CM

## 2018-05-01 DIAGNOSIS — E1165 Type 2 diabetes mellitus with hyperglycemia: Secondary | ICD-10-CM

## 2018-05-01 DIAGNOSIS — Z794 Long term (current) use of insulin: Secondary | ICD-10-CM

## 2018-05-01 DIAGNOSIS — F1910 Other psychoactive substance abuse, uncomplicated: Secondary | ICD-10-CM

## 2018-05-01 DIAGNOSIS — N179 Acute kidney failure, unspecified: Secondary | ICD-10-CM

## 2018-05-01 DIAGNOSIS — E0810 Diabetes mellitus due to underlying condition with ketoacidosis without coma: Secondary | ICD-10-CM

## 2018-05-01 DIAGNOSIS — E860 Dehydration: Secondary | ICD-10-CM

## 2018-05-01 LAB — GLUCOSE, CAPILLARY
Glucose-Capillary: 106 mg/dL — ABNORMAL HIGH (ref 70–99)
Glucose-Capillary: 305 mg/dL — ABNORMAL HIGH (ref 70–99)

## 2018-05-01 LAB — BASIC METABOLIC PANEL
ANION GAP: 5 (ref 5–15)
BUN: 7 mg/dL (ref 6–20)
CALCIUM: 8.2 mg/dL — AB (ref 8.9–10.3)
CO2: 25 mmol/L (ref 22–32)
CREATININE: 0.62 mg/dL (ref 0.61–1.24)
Chloride: 105 mmol/L (ref 98–111)
GFR calc Af Amer: >60 mL/min (ref >60)
GFR calc non Af Amer: >60 mL/min (ref >60)
Glucose, Bld: 96 mg/dL (ref 70–99)
Potassium: 3.2 mmol/L — ABNORMAL LOW (ref 3.5–5.1)
Sodium: 135 mmol/L (ref 135–145)

## 2018-05-01 LAB — CBC
HCT: 38.3 % — ABNORMAL LOW (ref 39.0–52.0)
Hemoglobin: 12.1 g/dL — ABNORMAL LOW (ref 13.0–17.0)
MCH: 26.9 pg (ref 26.0–34.0)
MCHC: 31.6 g/dL (ref 30.0–36.0)
MCV: 85.1 fL (ref 80.0–100.0)
PLATELETS: 188 10*3/uL (ref 150–400)
RBC: 4.5 MIL/uL (ref 4.22–5.81)
RDW: 13.3 % (ref 11.5–15.5)
WBC: 5.8 10*3/uL (ref 4.0–10.5)
nRBC: 0 % (ref 0.0–0.2)

## 2018-05-01 MED ORDER — POTASSIUM CHLORIDE CRYS ER 20 MEQ PO TBCR
40.0000 meq | EXTENDED_RELEASE_TABLET | Freq: Once | ORAL | Status: AC
  Administered 2018-05-01: 40 meq via ORAL
  Filled 2018-05-01: qty 2

## 2018-05-01 MED ORDER — AMLODIPINE BESYLATE 5 MG PO TABS: 5.0000 mg | ORAL_TABLET | Freq: Every day | ORAL | 1 refills | Status: DC

## 2018-05-01 MED ORDER — SUCRALFATE 1 GM/10ML PO SUSP: 1.0000 g | Freq: Three times a day (TID) | ORAL | 0 refills | Status: DC

## 2018-05-01 MED ORDER — PANTOPRAZOLE SODIUM 40 MG PO TBEC: 40.0000 mg | DELAYED_RELEASE_TABLET | Freq: Two times a day (BID) | ORAL | 1 refills | Status: DC

## 2018-05-01 MED ORDER — AMLODIPINE BESYLATE 5 MG PO TABS
5.0000 mg | ORAL_TABLET | Freq: Every day | ORAL | Status: DC
  Administered 2018-05-01: 5 mg via ORAL
  Filled 2018-05-01: qty 1

## 2018-05-01 NOTE — Discharge Summary (Addendum)
Physician Discharge Summary  Rickey Smith ZOX:[id number] DOB: December 07, 1965 DOA: 04/28/2018  PCP: Marcine Matar, MD  Admit date: 04/28/2018 Discharge date: 05/01/2018  Admitted From: Home Disposition:  Home   Recommendations for Outpatient Follow-up:  1. Follow up with PCP in 1-2 weeks 2. Please obtain BMP/CBC in one week    Discharge Condition: Stable CODE STATUS: FULL Diet recommendation: Heart Healthy / Carb Modified  Brief/Interim Summary: 52 year old male with a history of insulin-dependent diabetes mellitus, cocaine abuse presenting with 1 day history of nausea and vomiting that began on 04/27/2018. The patient is a poor historian and often deflectsspecific questioning. He endorses compliance with his medications including 7/30 insulin. He denied any new medications. He denies any fevers, chills, chest pain, shortness breath, dysuria,, hematuria, diarrhea, hematochezia, melena. He states that he is able to obtain his insulin. Interestingly, the patient denies illicit drug use including cocaine. Upon admission, the patient was noted to have a serum glucose of 330 and anion gap of 18 with ketonuria. He was started on IV insulin and IV fluids. However, the patient continued to have nausea and vomiting.  CT was obtained and showed esophageal wall thickening.  GI was consulted and recommended outpt endoscopy once cocaine is cleared.  They started carafate and increase PPI to bid.  Pt improved and his diet was advanced which he tolerated.  Discharge Diagnoses:   DKAtype 2 -patient started on IV insulin with q 1 hour CBG check and q 4 hour BMPs -pt started on aggressive fluid resuscitation -Electrolytes were monitored and repleted -transitioned to [place] insulin once anion gap closed -diet was advanced once anion gap closed -increased NPH to 18 units bid -d/c home with prior dose of 70/30 insulin--pt states he has it at home -HbA1C--10.7 -C-peptide <0.1  Intractable  vomiting and abdominal pain -due to exacerbation of gastroparesis -CT abd/pelvis--circumferential esophageal wall thickening -check lipase--20 -UA neg for pyuria -advance to carb modified diet-->tolerated  Esophagitis -GI consulted>>EGD outpt once cocaine cleared from system -increase protonix to bid  -added carafate with meals  Cocaine abuse -pt denies using despite urine showing positive cocaine  Hypokalemia -repleted -check mag--2.1  AKI -due to volume depletion -serum creatinine peaked 1.20 -baseline creatinine 0.6-0.8 -improved -serum creatinine 0.62 on day of d/c  Essential Hypertension -SBP 150-160 and DBP in 90s during hospitalization -started amlodipine    Discharge Instructions   Allergies as of 05/01/2018      Reactions   Gabapentin Diarrhea, Nausea And Vomiting   Had to be hospitalized because of the reaction(s)   Codeine Itching   Penicillins Hives, Rash   Has patient had a PCN reaction causing immediate rash, facial/tongue/throat swelling, SOB or lightheadedness with hypotension: Yes Has patient had a PCN reaction causing severe rash involving mucus membranes or skin necrosis: Unk Has patient had a PCN reaction that required hospitalization: Unk Has patient had a PCN reaction occurring within the last 10 years: Yes If all of the above answers are "NO", then may proceed with Cephalosporin use.      Medication List    TAKE these medications   amitriptyline 75 MG tablet Commonly known as:  ELAVIL Take 1 tablet (75 mg total) by mouth at bedtime.   amLODipine 5 MG tablet Commonly known as:  NORVASC Take 1 tablet (5 mg total) by mouth daily.   insulin aspart protamine- aspart (70-30) 100 UNIT/ML injection Commonly known as:  NOVOLOG MIX 70/30 Inject 0.2 mLs (20 Units total) into the skin 2 (two) times  daily with a meal. What changed:    how much to take  additional instructions   pantoprazole 40 MG tablet Commonly known as:   PROTONIX Take 1 tablet (40 mg total) by mouth 2 (two) times daily before a meal.   sucralfate 1 GM/10ML suspension Commonly known as:  CARAFATE Take 10 mLs (1 g total) by mouth 4 (four) times daily -  with meals and at bedtime.       Allergies  Allergen Reactions  . Gabapentin Diarrhea and Nausea And Vomiting    Had to be hospitalized because of the reaction(s)  . Codeine Itching  . Penicillins Hives and Rash    Has patient had a PCN reaction causing immediate rash, facial/tongue/throat swelling, SOB or lightheadedness with hypotension: Yes Has patient had a PCN reaction causing severe rash involving mucus membranes or skin necrosis: Unk Has patient had a PCN reaction that required hospitalization: Unk Has patient had a PCN reaction occurring within the last 10 years: Yes If all of the above answers are "NO", then may proceed with Cephalosporin use.     Consultations:  GI   Procedures/Studies: Dg Chest 2 View  Result Date: 04/28/2018 CLINICAL DATA:  Diabetes.  Diabetic ketoacidosis EXAM: CHEST - 2 VIEW COMPARISON:  Two-view chest x-ray 11/10/2017 FINDINGS: The heart size and mediastinal contours are within normal limits. Both lungs are clear. The visualized skeletal structures are unremarkable. IMPRESSION: Negative two view chest x-ray Electronically Signed   By: Marin Roberts M.D.   On: 04/28/2018 11:07   Ct Abdomen Pelvis W Contrast  Result Date: 04/29/2018 CLINICAL DATA:  Mid to lower abdominal pain since yesterday. Nausea and diarrhea. EXAM: CT ABDOMEN AND PELVIS WITH CONTRAST TECHNIQUE: Multidetector CT imaging of the abdomen and pelvis was performed using the standard protocol following bolus administration of intravenous contrast. CONTRAST:  ISOVUE-300 IOPAMIDOL (ISOVUE-300) INJECTION 61% COMPARISON:  02/11/2016 FINDINGS: Lower chest: Circumferential wall thickening noted distal esophagus 6 mm posterior right lower lobe nodule (1/5) unchanged since prior study.  Right middle lobe subpleural nodule (9/5) is also stable. More than 2 years of imaging stability in these lesions is compatible with benign etiology. Hepatobiliary: Small low-density lesions in the dome of the liver are stable. Ill-defined 12 mm hypoattenuating focus in the medial right liver (15/2) is unchanged. Liver otherwise unremarkable. There is no evidence for gallstones, gallbladder wall thickening, or pericholecystic fluid. No intrahepatic or extrahepatic biliary dilation. Pancreas: No focal mass lesion. No dilatation of the main duct. No intraparenchymal cyst. No peripancreatic edema. Spleen: Small hypoattenuating lesion in the dome of the spleen is stable. Adrenals/Urinary Tract: 18 mm left adrenal nodule is stable. 14 mm right adrenal nodule is unchanged. Cortical scarring noted inferiorly in the right kidney. Stable 15 mm cyst upper pole left kidney. No evidence for hydroureter. The urinary bladder is markedly distended. Stomach/Bowel: Stomach is nondistended. No gastric wall thickening. No evidence of outlet obstruction. Duodenum is normally positioned as is the ligament of Treitz. No small bowel wall thickening. No small bowel dilatation. The terminal ileum is normal. The appendix is normal. No gross colonic mass. No colonic wall thickening. No substantial diverticular change. Vascular/Lymphatic: There is abdominal aortic atherosclerosis without aneurysm. There is no gastrohepatic or hepatoduodenal ligament lymphadenopathy. No intraperitoneal or retroperitoneal lymphadenopathy. No pelvic sidewall lymphadenopathy. Reproductive: The prostate gland and seminal vesicles have normal imaging features. Other: No intraperitoneal free fluid. Musculoskeletal: Small bilateral groin hernias contain only fat. No worrisome lytic or sclerotic osseous abnormality. IMPRESSION: Circumferential wall  thickening distal esophagus. 1. Circumferential esophageal wall thickening. Imaging features likely related to esophagitis  although neoplasm can have this appearance. 2. Marked distention of the urinary bladder. Urinary retention not excluded. 3. Stable low-density lesions in the liver and spleen since 02/11/2016, consistent with benign etiology. 4. Stable bilateral adrenal nodules suggesting adenomas. 5. Small bilateral groin hernias contain only fat. 6.  Aortic Atherosclerois (ICD10-170.0) Electronically Signed   By: Kennith Center M.D.   On: 04/29/2018 12:01        Discharge Exam: Vitals:   04/30/18 2148 05/01/18 0654  BP: (!) 155/104 (!) 136/94  Pulse: 87 90  Resp: 16 18  Temp: 98.4 F (36.9 C) 98.1 F (36.7 C)  SpO2: 100% 99%   Vitals:   04/30/18 1812 04/30/18 1959 04/30/18 2148 05/01/18 0654  BP: (!) 157/94  (!) 155/104 (!) 136/94  Pulse: (!) 104  87 90  Resp: 16  16 18   Temp: 98.2 F (36.8 C)  98.4 F (36.9 C) 98.1 F (36.7 C)  TempSrc: Oral  Oral Oral  SpO2: 99% 98% 100% 99%  Weight:      Height:        General: Pt is alert, awake, not in acute distress Cardiovascular: RRR, S1/S2 +, no rubs, no gallops Respiratory: CTA bilaterally, no wheezing, no rhonchi Abdominal: Soft, NT, ND, bowel sounds + Extremities: no edema, no cyanosis   The results of significant diagnostics from this hospitalization (including imaging, microbiology, ancillary and laboratory) are listed below for reference.    Significant Diagnostic Studies: Dg Chest 2 View  Result Date: 04/28/2018 CLINICAL DATA:  Diabetes.  Diabetic ketoacidosis EXAM: CHEST - 2 VIEW COMPARISON:  Two-view chest x-ray 11/10/2017 FINDINGS: The heart size and mediastinal contours are within normal limits. Both lungs are clear. The visualized skeletal structures are unremarkable. IMPRESSION: Negative two view chest x-ray Electronically Signed   By: Marin Roberts M.D.   On: 04/28/2018 11:07   Ct Abdomen Pelvis W Contrast  Result Date: 04/29/2018 CLINICAL DATA:  Mid to lower abdominal pain since yesterday. Nausea and diarrhea. EXAM: CT  ABDOMEN AND PELVIS WITH CONTRAST TECHNIQUE: Multidetector CT imaging of the abdomen and pelvis was performed using the standard protocol following bolus administration of intravenous contrast. CONTRAST:  ISOVUE-300 IOPAMIDOL (ISOVUE-300) INJECTION 61% COMPARISON:  02/11/2016 FINDINGS: Lower chest: Circumferential wall thickening noted distal esophagus 6 mm posterior right lower lobe nodule (1/5) unchanged since prior study. Right middle lobe subpleural nodule (9/5) is also stable. More than 2 years of imaging stability in these lesions is compatible with benign etiology. Hepatobiliary: Small low-density lesions in the dome of the liver are stable. Ill-defined 12 mm hypoattenuating focus in the medial right liver (15/2) is unchanged. Liver otherwise unremarkable. There is no evidence for gallstones, gallbladder wall thickening, or pericholecystic fluid. No intrahepatic or extrahepatic biliary dilation. Pancreas: No focal mass lesion. No dilatation of the main duct. No intraparenchymal cyst. No peripancreatic edema. Spleen: Small hypoattenuating lesion in the dome of the spleen is stable. Adrenals/Urinary Tract: 18 mm left adrenal nodule is stable. 14 mm right adrenal nodule is unchanged. Cortical scarring noted inferiorly in the right kidney. Stable 15 mm cyst upper pole left kidney. No evidence for hydroureter. The urinary bladder is markedly distended. Stomach/Bowel: Stomach is nondistended. No gastric wall thickening. No evidence of outlet obstruction. Duodenum is normally positioned as is the ligament of Treitz. No small bowel wall thickening. No small bowel dilatation. The terminal ileum is normal. The appendix is normal. No  gross colonic mass. No colonic wall thickening. No substantial diverticular change. Vascular/Lymphatic: There is abdominal aortic atherosclerosis without aneurysm. There is no gastrohepatic or hepatoduodenal ligament lymphadenopathy. No intraperitoneal or retroperitoneal  lymphadenopathy. No pelvic sidewall lymphadenopathy. Reproductive: The prostate gland and seminal vesicles have normal imaging features. Other: No intraperitoneal free fluid. Musculoskeletal: Small bilateral groin hernias contain only fat. No worrisome lytic or sclerotic osseous abnormality. IMPRESSION: Circumferential wall thickening distal esophagus. 1. Circumferential esophageal wall thickening. Imaging features likely related to esophagitis although neoplasm can have this appearance. 2. Marked distention of the urinary bladder. Urinary retention not excluded. 3. Stable low-density lesions in the liver and spleen since 02/11/2016, consistent with benign etiology. 4. Stable bilateral adrenal nodules suggesting adenomas. 5. Small bilateral groin hernias contain only fat. 6.  Aortic Atherosclerois (ICD10-170.0) Electronically Signed   By: Kennith Center M.D.   On: 04/29/2018 12:01     Microbiology: Recent Results (from the past 240 hour(s))  MRSA PCR Screening     Status: None   Collection Time: 04/28/18  5:23 PM  Result Value Ref Range Status   MRSA by PCR NEGATIVE NEGATIVE Final    Comment:        The GeneXpert MRSA Assay (FDA approved for NASAL specimens only), is one component of a comprehensive MRSA colonization surveillance program. It is not intended to diagnose MRSA infection nor to guide or monitor treatment for MRSA infections. Performed at [facility], [address], [place], [place] [address]      Labs: Basic Metabolic Panel: Recent Labs  Lab 04/28/18 2017 04/29/18 0047 04/29/18 1229 04/30/18 0409 05/01/18 0540  NA 134* 135 135 133* 135  K 4.1 3.3* 3.2* 3.7 3.2*  CL 107 110 103 102 105  CO2 19* 20* 22 22 25   GLUCOSE 240* 149* 126* 228* 96  BUN 17 15 9 7 7   CREATININE 0.82 0.66 0.55* 0.68 0.62  CALCIUM 8.0* 8.1* 8.5* 8.1* 8.2*  MG  --   --  2.1  --   --    Liver Function Tests: Recent Labs  Lab 04/28/18 0906  AST 13*  ALT 15  ALKPHOS 76  BILITOT 1.4*    PROT 8.3*  ALBUMIN 4.5   Recent Labs  Lab 04/29/18 1229  LIPASE 20   No results for input(s): AMMONIA in the last 168 hours. CBC: Recent Labs  Lab 04/28/18 0906 04/28/18 0918 04/29/18 0406 05/01/18 0540  WBC 7.4  --  14.4* 5.8  NEUTROABS 6.5  --   --   --   HGB 13.5 15.0 11.3* 12.1*  HCT 43.1 44.0 35.6* 38.3*  MCV 86.9  --  84.6 85.1  PLT 246  --  232 188   Cardiac Enzymes: No results for input(s): CKTOTAL, CKMB, CKMBINDEX, TROPONINI in the last 168 hours. BNP: Invalid input(s): POCBNP CBG: Recent Labs  Lab 04/30/18 0746 04/30/18 1211 04/30/18 1705 04/30/18 2148 05/01/18 0742  GLUCAP 193* 209* 174* 263* 106*    Time coordinating discharge:  36 minutes  Signed:  Catarina Hartshorn, DO [facility] Pager: [phone] 05/01/2018, 9:46 AM

## 2018-05-01 NOTE — Progress Notes (Signed)
IV discontinue,catheter intact. Discharge instructions given on medications and follow up visits,patient verbalized understanding. Prescription sent to Pharmacy of choice documented on AVS. Accompanied by staff to an awaiting vehicle.

## 2018-05-01 NOTE — Care Management (Signed)
Admitted with DKA. Pt from home, ind pta. Has PCP, no insurance. Has not had [facility] in 1 year. Would like to use again. CM has provided [facility] for new medications.

## 2018-05-18 ENCOUNTER — Other Ambulatory Visit: Payer: Self-pay

## 2018-05-18 ENCOUNTER — Encounter (HOSPITAL_COMMUNITY): Payer: Self-pay

## 2018-05-18 ENCOUNTER — Ambulatory Visit: Payer: Self-pay | Attending: Internal Medicine | Admitting: Internal Medicine

## 2018-05-18 ENCOUNTER — Observation Stay (HOSPITAL_COMMUNITY)
Admission: EM | Admit: 2018-05-18 | Discharge: 2018-05-19 | Disposition: A | Discharge status: Home / Self Care | Payer: Self-pay | Attending: Internal Medicine | Admitting: Internal Medicine

## 2018-05-18 ENCOUNTER — Encounter: Payer: Self-pay | Admitting: Internal Medicine

## 2018-05-18 ENCOUNTER — Emergency Department (HOSPITAL_COMMUNITY): Payer: Self-pay

## 2018-05-18 VITALS — BP 111/73 | HR 117 | Temp 98.0°F | Resp 16 | Wt 148.2 lb

## 2018-05-18 DIAGNOSIS — R358 Other polyuria: Secondary | ICD-10-CM

## 2018-05-18 DIAGNOSIS — R000 Tachycardia, unspecified: Secondary | ICD-10-CM | POA: Insufficient documentation

## 2018-05-18 DIAGNOSIS — R634 Abnormal weight loss: Secondary | ICD-10-CM

## 2018-05-18 DIAGNOSIS — K219 Gastro-esophageal reflux disease without esophagitis: Secondary | ICD-10-CM | POA: Insufficient documentation

## 2018-05-18 DIAGNOSIS — E1142 Type 2 diabetes mellitus with diabetic polyneuropathy: Secondary | ICD-10-CM | POA: Insufficient documentation

## 2018-05-18 DIAGNOSIS — Z880 Allergy status to penicillin: Secondary | ICD-10-CM | POA: Insufficient documentation

## 2018-05-18 DIAGNOSIS — E785 Hyperlipidemia, unspecified: Secondary | ICD-10-CM | POA: Insufficient documentation

## 2018-05-18 DIAGNOSIS — E1165 Type 2 diabetes mellitus with hyperglycemia: Secondary | ICD-10-CM | POA: Insufficient documentation

## 2018-05-18 DIAGNOSIS — I10 Essential (primary) hypertension: Secondary | ICD-10-CM | POA: Insufficient documentation

## 2018-05-18 DIAGNOSIS — Z87891 Personal history of nicotine dependence: Secondary | ICD-10-CM | POA: Insufficient documentation

## 2018-05-18 DIAGNOSIS — E1140 Type 2 diabetes mellitus with diabetic neuropathy, unspecified: Secondary | ICD-10-CM | POA: Diagnosis present

## 2018-05-18 DIAGNOSIS — Z885 Allergy status to narcotic agent status: Secondary | ICD-10-CM | POA: Insufficient documentation

## 2018-05-18 DIAGNOSIS — F1490 Cocaine use, unspecified, uncomplicated: Secondary | ICD-10-CM | POA: Insufficient documentation

## 2018-05-18 DIAGNOSIS — Z794 Long term (current) use of insulin: Secondary | ICD-10-CM | POA: Insufficient documentation

## 2018-05-18 DIAGNOSIS — Z79899 Other long term (current) drug therapy: Secondary | ICD-10-CM | POA: Insufficient documentation

## 2018-05-18 DIAGNOSIS — R42 Dizziness and giddiness: Secondary | ICD-10-CM | POA: Insufficient documentation

## 2018-05-18 DIAGNOSIS — E876 Hypokalemia: Secondary | ICD-10-CM | POA: Insufficient documentation

## 2018-05-18 DIAGNOSIS — E860 Dehydration: Secondary | ICD-10-CM

## 2018-05-18 DIAGNOSIS — R739 Hyperglycemia, unspecified: Secondary | ICD-10-CM | POA: Diagnosis present

## 2018-05-18 DIAGNOSIS — Z8249 Family history of ischemic heart disease and other diseases of the circulatory system: Secondary | ICD-10-CM | POA: Insufficient documentation

## 2018-05-18 DIAGNOSIS — E0941 Drug or chemical induced diabetes mellitus with neurological complications with diabetic mononeuropathy: Secondary | ICD-10-CM

## 2018-05-18 DIAGNOSIS — F1910 Other psychoactive substance abuse, uncomplicated: Secondary | ICD-10-CM | POA: Insufficient documentation

## 2018-05-18 DIAGNOSIS — I509 Heart failure, unspecified: Secondary | ICD-10-CM | POA: Insufficient documentation

## 2018-05-18 DIAGNOSIS — R631 Polydipsia: Secondary | ICD-10-CM

## 2018-05-18 DIAGNOSIS — Z9114 Patient's other noncompliance with medication regimen: Secondary | ICD-10-CM | POA: Insufficient documentation

## 2018-05-18 DIAGNOSIS — N179 Acute kidney failure, unspecified: Secondary | ICD-10-CM | POA: Insufficient documentation

## 2018-05-18 DIAGNOSIS — Z888 Allergy status to other drugs, medicaments and biological substances status: Secondary | ICD-10-CM | POA: Insufficient documentation

## 2018-05-18 LAB — URINALYSIS, ROUTINE W REFLEX MICROSCOPIC
BILIRUBIN URINE: NEGATIVE
Bacteria, UA: NONE SEEN
Glucose, UA: >=500 mg/dL — AB
Hgb urine dipstick: NEGATIVE
Ketones, ur: NEGATIVE mg/dL
Leukocytes, UA: NEGATIVE
Nitrite: NEGATIVE
PROTEIN: NEGATIVE mg/dL
SPECIFIC GRAVITY, URINE: 1.029 (ref 1.005–1.030)
pH: 6 (ref 5.0–8.0)

## 2018-05-18 LAB — RAPID URINE DRUG SCREEN, HOSP PERFORMED
AMPHETAMINES: NOT DETECTED
BENZODIAZEPINES: NOT DETECTED
Barbiturates: NOT DETECTED
COCAINE: NOT DETECTED
Opiates: NOT DETECTED
TETRAHYDROCANNABINOL: NOT DETECTED

## 2018-05-18 LAB — COMPREHENSIVE METABOLIC PANEL
ALBUMIN: 3.4 g/dL — AB (ref 3.5–5.0)
ALT: 14 U/L (ref 0–44)
AST: 18 U/L (ref 15–41)
Alkaline Phosphatase: 80 U/L (ref 38–126)
Anion gap: 13 (ref 5–15)
BUN: 31 mg/dL — ABNORMAL HIGH (ref 6–20)
CHLORIDE: 88 mmol/L — AB (ref 98–111)
CO2: 21 mmol/L — AB (ref 22–32)
Calcium: 8.4 mg/dL — ABNORMAL LOW (ref 8.9–10.3)
Creatinine, Ser: 1.18 mg/dL (ref 0.61–1.24)
GFR calc Af Amer: >60 mL/min (ref >60)
GFR calc non Af Amer: >60 mL/min (ref >60)
GLUCOSE: 819 mg/dL — AB (ref 70–99)
Potassium: 4.7 mmol/L (ref 3.5–5.1)
SODIUM: 122 mmol/L — AB (ref 135–145)
Total Bilirubin: 0.4 mg/dL (ref 0.3–1.2)
Total Protein: 6.2 g/dL — ABNORMAL LOW (ref 6.5–8.1)

## 2018-05-18 LAB — CBC
HEMATOCRIT: 40.7 % (ref 39.0–52.0)
Hemoglobin: 12.7 g/dL — ABNORMAL LOW (ref 13.0–17.0)
MCH: 26.2 pg (ref 26.0–34.0)
MCHC: 31.2 g/dL (ref 30.0–36.0)
MCV: 83.9 fL (ref 80.0–100.0)
NRBC: 0 % (ref 0.0–0.2)
PLATELETS: 284 10*3/uL (ref 150–400)
RBC: 4.85 MIL/uL (ref 4.22–5.81)
RDW: 12.8 % (ref 11.5–15.5)
WBC: 8.7 10*3/uL (ref 4.0–10.5)

## 2018-05-18 LAB — BASIC METABOLIC PANEL
Anion gap: 7 (ref 5–15)
BUN: 21 mg/dL — AB (ref 6–20)
CO2: 24 mmol/L (ref 22–32)
CREATININE: 0.94 mg/dL (ref 0.61–1.24)
Calcium: 7.2 mg/dL — ABNORMAL LOW (ref 8.9–10.3)
Chloride: 104 mmol/L (ref 98–111)
GFR calc Af Amer: >60 mL/min (ref >60)
GFR calc non Af Amer: >60 mL/min (ref >60)
GLUCOSE: 205 mg/dL — AB (ref 70–99)
POTASSIUM: 3.2 mmol/L — AB (ref 3.5–5.1)
Sodium: 135 mmol/L (ref 135–145)

## 2018-05-18 LAB — GLUCOSE, CAPILLARY
Glucose-Capillary: 268 mg/dL — ABNORMAL HIGH (ref 70–99)
Glucose-Capillary: 50 mg/dL — ABNORMAL LOW (ref 70–99)
Glucose-Capillary: 66 mg/dL — ABNORMAL LOW (ref 70–99)

## 2018-05-18 LAB — GLUCOSE, POCT (MANUAL RESULT ENTRY)

## 2018-05-18 LAB — CBG MONITORING, ED: Glucose-Capillary: 380 mg/dL — ABNORMAL HIGH (ref 70–99)

## 2018-05-18 MED ORDER — ACETAMINOPHEN 325 MG PO TABS
650.0000 mg | ORAL_TABLET | Freq: Four times a day (QID) | ORAL | Status: DC | PRN
  Administered 2018-05-18: 650 mg via ORAL
  Filled 2018-05-18: qty 2

## 2018-05-18 MED ORDER — ENOXAPARIN SODIUM 40 MG/0.4ML ~~LOC~~ SOLN
40.0000 mg | SUBCUTANEOUS | Status: DC
  Administered 2018-05-18: 40 mg via SUBCUTANEOUS
  Filled 2018-05-18: qty 0.4

## 2018-05-18 MED ORDER — INSULIN REGULAR(HUMAN) IN NACL 100-0.9 UT/100ML-% IV SOLN: INTRAVENOUS | Status: DC

## 2018-05-18 MED ORDER — INSULIN ASPART 100 UNIT/ML ~~LOC~~ SOLN
0.0000 [IU] | Freq: Three times a day (TID) | SUBCUTANEOUS | Status: DC
  Administered 2018-05-19: 2 [IU] via SUBCUTANEOUS
  Administered 2018-05-19: 11 [IU] via SUBCUTANEOUS

## 2018-05-18 MED ORDER — SODIUM CHLORIDE 0.9 % IV SOLN
INTRAVENOUS | Status: DC
  Administered 2018-05-18 – 2018-05-19 (×3): via INTRAVENOUS

## 2018-05-18 MED ORDER — DEXTROSE-NACL 5-0.45 % IV SOLN: INTRAVENOUS | Status: DC

## 2018-05-18 MED ORDER — ONDANSETRON HCL 4 MG/2ML IJ SOLN: 4.0000 mg | Freq: Four times a day (QID) | INTRAMUSCULAR | Status: DC | PRN

## 2018-05-18 MED ORDER — SODIUM CHLORIDE 0.9 % IV BOLUS
1000.0000 mL | Freq: Once | INTRAVENOUS | Status: AC
  Administered 2018-05-18: 1000 mL via INTRAVENOUS

## 2018-05-18 MED ORDER — ONDANSETRON HCL 4 MG PO TABS: 4.0000 mg | ORAL_TABLET | Freq: Four times a day (QID) | ORAL | Status: DC | PRN

## 2018-05-18 MED ORDER — INSULIN ASPART 100 UNIT/ML ~~LOC~~ SOLN
30.0000 [IU] | Freq: Once | SUBCUTANEOUS | Status: AC
  Administered 2018-05-18: 30 [IU] via SUBCUTANEOUS

## 2018-05-18 MED ORDER — POLYETHYLENE GLYCOL 3350 17 G PO PACK: 17.0000 g | PACK | Freq: Every day | ORAL | Status: DC | PRN

## 2018-05-18 MED ORDER — INSULIN GLARGINE 100 UNIT/ML ~~LOC~~ SOLN
40.0000 [IU] | Freq: Every day | SUBCUTANEOUS | Status: DC
  Filled 2018-05-18 (×2): qty 0.4

## 2018-05-18 MED ORDER — AMITRIPTYLINE HCL 25 MG PO TABS
75.0000 mg | ORAL_TABLET | Freq: Every day | ORAL | Status: DC
  Administered 2018-05-18: 75 mg via ORAL
  Filled 2018-05-18: qty 3

## 2018-05-18 MED ORDER — TRAZODONE HCL 50 MG PO TABS
50.0000 mg | ORAL_TABLET | Freq: Every evening | ORAL | Status: DC | PRN
  Administered 2018-05-18: 50 mg via ORAL
  Filled 2018-05-18: qty 1

## 2018-05-18 MED ORDER — INSULIN ASPART 100 UNIT/ML ~~LOC~~ SOLN
0.0000 [IU] | Freq: Every day | SUBCUTANEOUS | Status: DC
  Administered 2018-05-18: 3 [IU] via SUBCUTANEOUS

## 2018-05-18 MED ORDER — SODIUM CHLORIDE 0.9 % IV BOLUS
2000.0000 mL | Freq: Once | INTRAVENOUS | Status: AC
  Administered 2018-05-18: 2000 mL via INTRAVENOUS

## 2018-05-18 MED ORDER — ACETAMINOPHEN 650 MG RE SUPP: 650.0000 mg | Freq: Four times a day (QID) | RECTAL | Status: DC | PRN

## 2018-05-18 NOTE — ED Notes (Signed)
POC CBG reading HIGH

## 2018-05-18 NOTE — Patient Instructions (Signed)
Your blood sugar reading is reported as HIGH on our meter which means your level is likely over 600.  Because of this, the dizziness, tachycardia (heart racing), reported frequent thirst and urination, I recommend that you be seen in the emergency room.

## 2018-05-18 NOTE — Progress Notes (Signed)
Pt states he is having pain come from b/l legs   

## 2018-05-18 NOTE — ED Triage Notes (Signed)
Pt arives to ED with complaints of hyperglycemia and tachycardia from [facility] for his follow-up appt after being admitted last week for DKA. Pt also has nasal congestion, did receive flu shot this year. Pt placed in position of comfort with call bel in reach, stretcher locked and lowered.

## 2018-05-18 NOTE — Progress Notes (Signed)
Patient ID: Rickey Smith, male    DOB: 11/12/1965  MRN: [id number]  CC: Dizziness and Hospitalization Follow-up   Subjective: Rickey Smith is a 52 y.o. male who presents for hosp f/u His concerns today include:  Pt with hx of HL, DM type 2 with neuropathy, HTN, polysubstance abuse.    DM: Patient was hospitalized 11/ 5-01/2018 with DKA.  He presented with nausea and vomiting.  CAT scan was obtained and showed esophageal wall thickening.  GI was consulted and recommended outpatient endoscopy.  Patient was also found to have cocaine in the urine. -Since discharge she reports that he went back to taking NovoLog 70/30 45 units in the a.m. and 40 units in the p.m.  Checking BS 5 x a day.  Did not bring log with him.  Reports BS in a.m in the 90s, high during lunch then low in the 60s in the evenings.  Here in the office today blood sugar reading on the glucometer is HIGH Endorses frequent thirst, urination, dizziness with position changes.  Dizziness started after dischg from hosp and started Norvasc.   Patient Active Problem List   Diagnosis Date Noted  . Abnormal CT scan, esophagus   . Hypokalemia 04/29/2018  . Dehydration   . AKI (acute kidney injury) (HCC) 11/10/2017  . DKA (diabetic ketoacidoses) (HCC) 08/17/2017  . Hyperlipidemia 06/26/2017  . Uncontrolled type 2 diabetes mellitus with hyperglycemia, with long-term current use of insulin (HCC) 12/28/2016  . Cocaine use 12/28/2016  . Polysubstance abuse (HCC) 04/30/2016  . Diabetic neuropathy (HCC) 08/10/2015  . Tachycardia 08/02/2015  . GERD (gastroesophageal reflux disease) 10/26/2014  . Hyperkalemia 10/23/2014  . Ex-smoker 04/26/2014     No current facility-administered medications on file prior to visit.    Current Outpatient Medications on File Prior to Visit  Medication Sig Dispense Refill  . amitriptyline (ELAVIL) 75 MG tablet Take 1 tablet (75 mg total) by mouth at bedtime. 30 tablet 3  . amLODipine (NORVASC) 5 MG  tablet Take 1 tablet (5 mg total) by mouth daily. 30 tablet 1  . insulin aspart protamine- aspart (NOVOLOG MIX 70/30) (70-30) 100 UNIT/ML injection Inject 0.2 mLs (20 Units total) into the skin 2 (two) times daily with a meal. (Patient taking differently: Inject 40-45 Units into the skin 2 (two) times daily with a meal. Injects 45 units every morning and 40 every night.) 10 mL 11  . pantoprazole (PROTONIX) 40 MG tablet Take 1 tablet (40 mg total) by mouth 2 (two) times daily before a meal. 60 tablet 1  . sucralfate (CARAFATE) 1 GM/10ML suspension Take 10 mLs (1 g total) by mouth 4 (four) times daily -  with meals and at bedtime. 420 mL 0    Allergies  Allergen Reactions  . Gabapentin Diarrhea and Nausea And Vomiting    Had to be hospitalized because of the reaction(s)  . Codeine Itching  . Penicillins Hives and Rash    Has patient had a PCN reaction causing immediate rash, facial/tongue/throat swelling, SOB or lightheadedness with hypotension: Yes Has patient had a PCN reaction causing severe rash involving mucus membranes or skin necrosis: Unk Has patient had a PCN reaction that required hospitalization: Unk Has patient had a PCN reaction occurring within the last 10 years: Yes If all of the above answers are "NO", then may proceed with Cephalosporin use.     Social History   Socioeconomic History  . Marital status: Married    Spouse name: Not on file  .  Number of children: Not on file  . Years of education: 79  . Highest education level: 12th grade  Occupational History  . Occupation: unemployed  Social Needs  . Financial resource strain: Not very hard  . Food insecurity:    Worry: Never true    Inability: Never true  . Transportation needs:    Medical: No    Non-medical: No  Tobacco Use  . Smoking status: Former Smoker    Packs/day: 0.50    Years: 34.00    Pack years: 17.00    Types: Cigarettes    Last attempt to quit: 02/2017    Years since quitting: 1.2  . Smokeless  tobacco: Never Used  Substance and Sexual Activity  . Alcohol use: Not Currently  . Drug use: Yes    Types: Cocaine, Heroin    Comment: 11/11/2017   . Sexual activity: Not Currently  Lifestyle  . Physical activity:    Days per week: 0 days    Minutes per session: 0 min  . Stress: Only a little  Relationships  . Social connections:    Talks on phone: Once a week    Gets together: Once a week    Attends religious service: Never    Active member of club or organization: No    Attends meetings of clubs or organizations: Never    Relationship status: Divorced  . Intimate partner violence:    Fear of current or ex partner: No    Emotionally abused: No    Physically abused: No    Forced sexual activity: No  Other Topics Concern  . 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    Family History  Problem Relation Age of Onset  . Heart attack Unknown   . Prostate cancer Unknown   . Hypertension Unknown   . Hypertension Mother   . Cancer Father     Past Surgical History:  Procedure Laterality Date  . TRANSURETHRAL RESECTION OF PROSTATE N/A 08/16/2015   Procedure: TRANSURETHRAL RESECTION DRAINAGE OF PROSTATE ABCESS;  Surgeon: Barron Alvine, MD;  Location: [facility];  Service: Urology;  Laterality: N/A;    ROS: Review of Systems Negative except as above PHYSICAL EXAM: BP 111/73   Pulse (!) 117   Temp 98 F (36.7 C) (Oral)   Resp 16   Wt 148 lb 3.2 oz (67.2 kg)   SpO2 96%   BMI 19.55 kg/m   Wt Readings from Last 3 Encounters:  05/18/18 148 lb 2.4 oz (67.2 kg)  05/18/18 148 lb 3.2 oz (67.2 kg)  04/30/18 155 lb 13.8 oz (70.7 kg)   BP 111/73, P 117 sitting, 100/69, P 127 standing Physical Exam General appearance -middle-aged Caucasian male in NAD Mental status -flat affect Mouth -oral mucosa is dry. Neck -  supple, no significant adenopathy Chest - clear to auscultation, no wheezes, rales or rhonchi, symmetric air entry Heart -tachycardic but regular.  Abdomen - soft, nontender, nondistended, no masses or organomegaly Extremities -no lower extremity edema.  Results for orders placed or performed in visit on 05/18/18  POCT glucose (manual entry)  Result Value Ref Range   POC Glucose     Lab Results  Component Value Date   HGBA1C 10.7 (H) 04/29/2018   Lab Results  Component Value  Date   WBC 8.7 05/18/2018   HGB 12.7 (L) 05/18/2018   HCT 40.7 05/18/2018   MCV 83.9 05/18/2018   PLT 284 05/18/2018     Chemistry      Component Value Date/Time   NA 122 (L) 05/18/2018 1615   NA 136 06/26/2017 1101   K 4.7 05/18/2018 1615   CL 88 (L) 05/18/2018 1615   CO2 21 (L) 05/18/2018 1615   BUN 31 (H) 05/18/2018 1615   BUN 19 06/26/2017 1101   CREATININE 1.18 05/18/2018 1615      Component Value Date/Time   CALCIUM 8.4 (L) 05/18/2018 1615   ALKPHOS 80 05/18/2018 1615   AST 18 05/18/2018 1615   ALT 14 05/18/2018 1615   BILITOT 0.4 05/18/2018 1615        ASSESSMENT AND PLAN:  1. Uncontrolled type 2 diabetes mellitus with hyperglycemia, with long-term current use of insulin (HCC) Given that the glucometer is reading HIGH and patient with symptoms of dizziness, polyuria and polydipsia and weight loss of 7 pounds since hospital discharge earlier this month, patient advised to be seen in the emergency room to be evaluated for possible DKA. - POCT glucose (manual entry)  2. Cocaine use Patient denies ongoing use.  Encourage him to get into a treatment program.  3. Dizziness See #1 above.  Patient will [facility]-up with [place] [facility] Patient was given the opportunity to ask questions.  Patient verbalized understanding of the plan and was able to repeat key elements of the plan.   Orders Placed This Encounter  Procedures  . POCT glucose (manual entry)     Requested Prescriptions     No prescriptions requested or ordered in this encounter    Return in about 2 weeks (around 06/01/2018).  Jonah Blue, MD, FACP

## 2018-05-18 NOTE — H&P (Signed)
History and Physical    Rickey Smith ZOX:[id number] DOB: 1965-09-16 DOA: 05/18/2018  PCP: Marcine Matar, MD Patient coming from: Home  Chief Complaint: Elevated blood glucose  HPI: Rickey Smith is a 52 y.o. male with medical history significant for IDDM, substance use and poor compliance with medication who presented to ED with hyperglycemia.  Patient was recently hospitalized from 11 5-11 8 for DKA.  He was discharged on insulin 70/30.  He reports good compliance with medication.  He states that his CBG was 90 this morning.  He presented to wellness clinic this afternoon for hospital follow-up.  He felt lightheaded when he got up from his chair.  CBG was checked and was too high to detect.  So he was sent to ED.  Patient reports good compliance with his insulin.  He denies recent illness other than just some nasal congestion, cough, cold and chills that have resolved.  Currently, he denies chest pain, fever, cough, dyspnea, palpitation, nausea, vomiting, abdominal pain, diarrhea, dysuria, polyuria or new focal weakness, numbness or tingling.  He admits polydipsia.  He reports drinking about 2 gallons of water a day.  He reports feeling better after he was given normal saline bolus in ED.  Lives alone.  Denies smoking cigarettes and drinking alcohol.  Admits using cocaine.  Last use about 2 weeks ago.  In ED, vital signs not impressive.  CBG greater than 600.  CMP significant for  Glucose to 819, sodium to 122 (corrects to normal), bicarb 21, anion gap 13, serum creatinine 1.18 (baseline 0.62).  CBC not impressive.  EKG done in ED but incomplete image interpret.  Portable chest x-ray without significant cardiopulmonary finding.  Urinalysis ordered but not collected.  IV fluid and insulin drip ordered by EDP.  Hospitalist service was called for admission.   Review of Systems  Constitutional: Negative for chills, fever and weight loss.  HENT: Negative for congestion and sore throat.     Eyes: Negative for blurred vision and photophobia.  Respiratory: Negative for cough and shortness of breath.   Cardiovascular: Negative for chest pain, palpitations, orthopnea and leg swelling.  Gastrointestinal: Negative for abdominal pain, blood in stool, diarrhea, melena, nausea and vomiting.  Genitourinary: Negative for dysuria, frequency and hematuria.  Musculoskeletal: Negative for myalgias.  Skin: Negative for rash.  Neurological: Positive for focal weakness. Negative for sensory change, speech change and headaches.       Left leg weakness (chronic)  Endo/Heme/Allergies: Does not bruise/bleed easily.  Psychiatric/Behavioral: Negative for substance abuse. The patient is not nervous/anxious.        Cocaine 2 weeks ago   PMH Past Medical History:  Diagnosis Date  . CHF (congestive heart failure) (HCC)    "think I have this before" (11/11/2017)  . Diabetic peripheral neuropathy (HCC)   . Migraine    "once q month or 2" (11/11/2017)  . Type II diabetes mellitus (HCC)    PSH Past Surgical History:  Procedure Laterality Date  . TRANSURETHRAL RESECTION OF PROSTATE N/A 08/16/2015   Procedure: TRANSURETHRAL RESECTION DRAINAGE OF PROSTATE ABCESS;  Surgeon: Barron Alvine, MD;  Location: [facility];  Service: Urology;  Laterality: N/A;   Fam HX Family History  Problem Relation Age of Onset  . Heart attack Unknown   . Prostate cancer Unknown   . Hypertension Unknown   . Hypertension Mother   . Cancer Father    Social Hx  reports that he quit smoking about 14 months ago. His smoking use  included cigarettes. He has a 17.00 pack-year smoking history. He has never used smokeless tobacco. He reports that he drank alcohol. He reports that he has current or past drug history. Drugs: Cocaine and Heroin.  Allergy Allergies  Allergen Reactions  . Gabapentin Diarrhea and Nausea And Vomiting    Had to be hospitalized because of the reaction(s)  . Codeine Itching  . Penicillins Hives and Rash     Has patient had a PCN reaction causing immediate rash, facial/tongue/throat swelling, SOB or lightheadedness with hypotension: Yes Has patient had a PCN reaction causing severe rash involving mucus membranes or skin necrosis: Unk Has patient had a PCN reaction that required hospitalization: Unk Has patient had a PCN reaction occurring within the last 10 years: Yes If all of the above answers are "NO", then may proceed with Cephalosporin use.    Home Meds Prior to Admission medications   Medication Sig Start Date End Date Taking? Authorizing Provider  amitriptyline (ELAVIL) 75 MG tablet Take 1 tablet (75 mg total) by mouth at bedtime. 04/16/17   Vivianne Master, PA-C  amLODipine (NORVASC) 5 MG tablet Take 1 tablet (5 mg total) by mouth daily. 05/01/18   Catarina Hartshorn, MD  insulin aspart protamine- aspart (NOVOLOG MIX 70/30) (70-30) 100 UNIT/ML injection Inject 0.2 mLs (20 Units total) into the skin 2 (two) times daily with a meal. Patient taking differently: Inject 40-45 Units into the skin 2 (two) times daily with a meal. Injects 45 units every morning and 40 every night. 11/13/17   Regalado, Belkys A, MD  pantoprazole (PROTONIX) 40 MG tablet Take 1 tablet (40 mg total) by mouth 2 (two) times daily before a meal. 05/01/18   Tat, Onalee Hua, MD  sucralfate (CARAFATE) 1 GM/10ML suspension Take 10 mLs (1 g total) by mouth 4 (four) times daily -  with meals and at bedtime. 05/01/18   Catarina Hartshorn, MD    Physical Exam: Vitals:   05/18/18 1645 05/18/18 1700 05/18/18 1745 05/18/18 1800  BP: 120/80 115/81 102/75 102/82  Pulse: 97 97 (!) 101 (!) 101  Resp: 20 (!) 22 (!) 21 (!) 21  Temp:      TempSrc:      SpO2: 97% 96% 96% 96%  Weight:      Height:        General: NAD, calm, comfortable Eyes: lids and conjunctivae normal HENT: atraumatic. Normocephalic. Hearing grossly intact. No rhinorrhea.  No dentition Neck/Endo: Supple. No mass. No thyromegaly Resp: normal effort. Good air movement bilaterally. No  wheeze, rales or rhonchi.  CVS: RRR. S1 & S2 heard. No murmurs. No edema.  GI: normal bowel sound. No tenderness. No distention. GU: no suprapubic or CVA tenderness.  MSK: No obvious deformity. No focal tenderness. Moving extremities.  Skin: no apparent lesion. Normal warmth.  Neuro: AAOx x4. CN 2-12 grossly intact.  Motor 3+/5 in left lower extremity normal elsewhere. Light sensation grossly intact. DTR symmetric. Psych: Calm. Normal judgment and insight  Labs on Admission: I have personally reviewed following labs and imaging studies  CBC: Recent Labs  Lab 05/18/18 1615  WBC 8.7  HGB 12.7*  HCT 40.7  MCV 83.9  PLT 284   Basic Metabolic Panel: Recent Labs  Lab 05/18/18 1615  NA 122*  K 4.7  CL 88*  CO2 21*  GLUCOSE 819*  BUN 31*  CREATININE 1.18  CALCIUM 8.4*   GFR: Estimated Creatinine Clearance: 70.4 mL/min (by C-G formula based on SCr of 1.18 mg/dL). Liver Function Tests:  Recent Labs  Lab 05/18/18 1615  AST 18  ALT 14  ALKPHOS 80  BILITOT 0.4  PROT 6.2*  ALBUMIN 3.4*   No results for input(s): LIPASE, AMYLASE in the last 168 hours. No results for input(s): AMMONIA in the last 168 hours. Coagulation Profile: No results for input(s): INR, PROTIME in the last 168 hours. Cardiac Enzymes: No results for input(s): CKTOTAL, CKMB, CKMBINDEX, TROPONINI in the last 168 hours. BNP (last 3 results) No results for input(s): PROBNP in the last 8760 hours. HbA1C: No results for input(s): HGBA1C in the last 72 hours. CBG: Recent Labs  Lab 05/18/18 1602  GLUCAP >600*   Lipid Profile: No results for input(s): CHOL, HDL, LDLCALC, TRIG, CHOLHDL, LDLDIRECT in the last 72 hours. Thyroid Function Tests: No results for input(s): TSH, T4TOTAL, FREET4, T3FREE, THYROIDAB in the last 72 hours. Anemia Panel: No results for input(s): VITAMINB12, FOLATE, FERRITIN, TIBC, IRON, RETICCTPCT in the last 72 hours. Urine analysis:    Component Value Date/Time   COLORURINE YELLOW  04/28/2018 2221   APPEARANCEUR CLEAR 04/28/2018 2221   LABSPEC 1.029 04/28/2018 2221   PHURINE 5.0 04/28/2018 2221   GLUCOSEU >=500 (A) 04/28/2018 2221   HGBUR NEGATIVE 04/28/2018 2221   BILIRUBINUR NEGATIVE 04/28/2018 2221   BILIRUBINUR neg 08/10/2015 1041   KETONESUR 80 (A) 04/28/2018 2221   PROTEINUR NEGATIVE 04/28/2018 2221   UROBILINOGEN 2.0 08/10/2015 1041   UROBILINOGEN 0.2 10/23/2014 0817   NITRITE NEGATIVE 04/28/2018 2221   LEUKOCYTESUR NEGATIVE 04/28/2018 2221    Sepsis Labs:  Patient without fever or leukocytosis.  Radiological Exams on Admission: Dg Chest Port 1 View  Result Date: 05/18/2018 CLINICAL DATA:  52 y/o  M; hyperglycemia and tachycardia.  Cough. EXAM: PORTABLE CHEST 1 VIEW COMPARISON:  04/28/2018 chest radiograph. FINDINGS: Stable heart size and mediastinal contours are within normal limits. Both lungs are clear. The visualized skeletal structures are unremarkable. IMPRESSION: No active disease. Electronically Signed   By: Mitzi HansenLance  Furusawa-Stratton M.D.   On: 05/18/2018 18:00    All images have been reviewed by me personally.   EKG: Incomplete EKG to interpret.   Assessment/Plan Principal Problem:   Hyperglycemia Active Problems:   Diabetic neuropathy (HCC)   Polysubstance abuse (HCC)   Uncontrolled type 2 diabetes mellitus with hyperglycemia, with long-term current use of insulin (HCC)   Hyperlipidemia   AKI (acute kidney injury) (HCC)  Hyperglycemia/DM 2: Suspect poor compliance with his medication.  A1c 10.4% about 2 weeks ago.  CBG greater than 600.  Elevated glucose to 819 on BMP.  Does not meet criteria for DKA or HHS.  Bicarb normal.  No significant anion gap.  Overall, patient is clinically well-appearing. -Discontinue insulin drip -Subcu insulin 30 units -Feed patient -Give 2L NS bolus followed by normal saline infusion at 150 cc/h. -Lantus 40 units nightly-and adjust as appropriate tomorrow -SSI-moderate -CBG ACHS -Repeat BMP  tonight. -Follow urinalysis -Consult diabetic coordinator  Hyponatremia: Corrects to normal for CBG. -IV fluid as above  AKI: Likely due to dehydration the setting of hypoglycemia -IV fluid as above -Repeat BMP in the morning  History of substance use -Check UDS  DVT prophylaxis: Lovenox Code Status: Full code Family Communication: No family member at bedside Disposition Plan: Admit to MedSurg.   Consults called: None Admission status:  Observation status.  Anticipate discharge tomorrow.  Almon Herculesaye T Shaeley Segall MD [facility] Pager [phone]  If 7PM-7AM, please contact night-coverage [email or web address] Password [email or web address]  05/18/2018, 6:22 PM

## 2018-05-18 NOTE — ED Provider Notes (Signed)
[facility] EMERGENCY DEPARTMENT Provider Note   CSN: [id number] Arrival date & time: 05/18/18  1552     History   Chief Complaint Chief Complaint  Patient presents with  . Hyperglycemia    HPI Rickey Smith is a 52 y.o. male.  Patient with hx iddm, presents after being seen in primary care doctor office and found to have blood sugar that read high. Patient indicates compliant w meds. Denies vomiting or diarrhea. Notes normal po intake. No polyuria. Denies fever or chills. Has noted recent non productive cough and congestion. No sore throat. No dysuria or other gu c/o.   The history is provided by the patient.  Hyperglycemia  Associated symptoms: no abdominal pain, no chest pain, no confusion, no dysuria, no fever and no shortness of breath     Past Medical History:  Diagnosis Date  . CHF (congestive heart failure) (HCC)    "think I have this before" (11/11/2017)  . Diabetic peripheral neuropathy (HCC)   . Migraine    "once q month or 2" (11/11/2017)  . Type II diabetes mellitus [facility])     Patient Active Problem List   Diagnosis Date Noted  . Abnormal CT scan, esophagus   . Hypokalemia 04/29/2018  . Dehydration   . AKI (acute kidney injury) (HCC) 11/10/2017  . DKA (diabetic ketoacidoses) (HCC) 08/17/2017  . Hyperlipidemia 06/26/2017  . Uncontrolled type 2 diabetes mellitus with hyperglycemia, with long-term current use of insulin (HCC) 12/28/2016  . Cocaine use 12/28/2016  . Polysubstance abuse (HCC) 04/30/2016  . Diabetic neuropathy (HCC) 08/10/2015  . Tachycardia 08/02/2015  . GERD (gastroesophageal reflux disease) 10/26/2014  . Hyperkalemia 10/23/2014  . Ex-smoker 04/26/2014    Past Surgical History:  Procedure Laterality Date  . TRANSURETHRAL RESECTION OF PROSTATE N/A 08/16/2015   Procedure: TRANSURETHRAL RESECTION DRAINAGE OF PROSTATE ABCESS;  Surgeon: Barron Alvine, MD;  Location: [facility] ORS;  Service: Urology;  Laterality: N/A;         Home Medications    Prior to Admission medications   Medication Sig Start Date End Date Taking? Authorizing Provider  amitriptyline (ELAVIL) 75 MG tablet Take 1 tablet (75 mg total) by mouth at bedtime. 04/16/17   Vivianne Master, PA-C  amLODipine (NORVASC) 5 MG tablet Take 1 tablet (5 mg total) by mouth daily. 05/01/18   Catarina Hartshorn, MD  insulin aspart protamine- aspart (NOVOLOG MIX 70/30) (70-30) 100 UNIT/ML injection Inject 0.2 mLs (20 Units total) into the skin 2 (two) times daily with a meal. Patient taking differently: Inject 40-45 Units into the skin 2 (two) times daily with a meal. Injects 45 units every morning and 40 every night. 11/13/17   Regalado, Belkys A, MD  pantoprazole (PROTONIX) 40 MG tablet Take 1 tablet (40 mg total) by mouth 2 (two) times daily before a meal. 05/01/18   Tat, Onalee Hua, MD  sucralfate (CARAFATE) 1 GM/10ML suspension Take 10 mLs (1 g total) by mouth 4 (four) times daily -  with meals and at bedtime. 05/01/18   Catarina Hartshorn, MD    Family History Family History  Problem Relation Age of Onset  . Heart attack Unknown   . Prostate cancer Unknown   . Hypertension Unknown   . Hypertension Mother   . Cancer Father     Social History Social History   Tobacco Use  . Smoking status: Former Smoker    Packs/day: 0.50    Years: 34.00    Pack years: 17.00    Types:  Cigarettes    Last attempt to quit: 02/2017    Years since quitting: 1.2  . Smokeless tobacco: Never Used  Substance Use Topics  . Alcohol use: Not Currently  . Drug use: Yes    Types: Cocaine, Heroin    Comment: 11/11/2017      Allergies   Gabapentin; Codeine; and Penicillins   Review of Systems Review of Systems  Constitutional: Negative for fever.  HENT: Negative for sore throat.   Eyes: Negative for redness.  Respiratory: Negative for shortness of breath.   Cardiovascular: Negative for chest pain.  Gastrointestinal: Negative for abdominal pain.  Genitourinary: Negative for  dysuria and flank pain.  Musculoskeletal: Negative for back pain and neck pain.  Skin: Negative for rash.  Neurological: Negative for headaches.  Hematological: Does not bruise/bleed easily.  Psychiatric/Behavioral: Negative for confusion.     Physical Exam Updated Vital Signs Pulse 96   Temp 98.1 F (36.7 C) (Oral)   Resp 17   Ht 1.854 m (6\' 1" )   Wt 67.2 kg   SpO2 96%   BMI 19.55 kg/m   Physical Exam  Constitutional: He appears well-developed and well-nourished.  HENT:  Mouth/Throat: Oropharynx is clear and moist.  Eyes: Conjunctivae are normal.  Neck: Neck supple. No tracheal deviation present.  Cardiovascular: Normal rate, regular rhythm, normal heart sounds and intact distal pulses. Exam reveals no gallop and no friction rub.  No murmur heard. Pulmonary/Chest: Effort normal and breath sounds normal. No accessory muscle usage. No respiratory distress.  Abdominal: Soft. Bowel sounds are normal. He exhibits no distension and no mass. There is no tenderness. There is no rebound and no guarding.  Genitourinary:  Genitourinary Comments: No cva tenderness  Musculoskeletal: He exhibits no edema or tenderness.  Neurological: He is alert.  Speech clear/fluent. Steady gait.   Skin: Skin is warm and dry. No rash noted.  Psychiatric: He has a normal mood and affect.  Nursing note and vitals reviewed.    ED Treatments / Results  Labs (all labs ordered are listed, but only abnormal results are displayed) Results for orders placed or performed during the hospital encounter of 05/18/18  CBC  Result Value Ref Range   WBC 8.7 4.0 - 10.5 K/uL   RBC 4.85 4.22 - 5.81 MIL/uL   Hemoglobin 12.7 (L) 13.0 - 17.0 g/dL   HCT 09.840.7 11.939.0 - 14.752.0 %   MCV 83.9 80.0 - 100.0 fL   MCH 26.2 26.0 - 34.0 pg   MCHC 31.2 30.0 - 36.0 g/dL   RDW 82.912.8 56.211.5 - 13.015.5 %   Platelets 284 150 - 400 K/uL   nRBC 0.0 0.0 - 0.2 %  Comprehensive metabolic panel  Result Value Ref Range   Sodium 122 (L) 135 -  145 mmol/L   Potassium 4.7 3.5 - 5.1 mmol/L   Chloride 88 (L) 98 - 111 mmol/L   CO2 21 (L) 22 - 32 mmol/L   Glucose, Bld 819 (HH) 70 - 99 mg/dL   BUN 31 (H) 6 - 20 mg/dL   Creatinine, Ser 8.651.18 0.61 - 1.24 mg/dL   Calcium 8.4 (L) 8.9 - 10.3 mg/dL   Total Protein 6.2 (L) 6.5 - 8.1 g/dL   Albumin 3.4 (L) 3.5 - 5.0 g/dL   AST 18 15 - 41 U/L   ALT 14 0 - 44 U/L   Alkaline Phosphatase 80 38 - 126 U/L   Total Bilirubin 0.4 0.3 - 1.2 mg/dL   GFR calc non Af Amer >60 >  60 mL/min   GFR calc Af Amer >60 >60 mL/min   Anion gap 13 5 - 15  CBG monitoring, ED  Result Value Ref Range   Glucose-Capillary >600 (HH) 70 - 99 mg/dL   Comment 1 Notify RN    Comment 2 Document in Chart    Dg Chest 2 View  Result Date: 04/28/2018 CLINICAL DATA:  Diabetes.  Diabetic ketoacidosis EXAM: CHEST - 2 VIEW COMPARISON:  Two-view chest x-ray 11/10/2017 FINDINGS: The heart size and mediastinal contours are within normal limits. Both lungs are clear. The visualized skeletal structures are unremarkable. IMPRESSION: Negative two view chest x-ray Electronically Signed   By: Marin Roberts M.D.   On: 04/28/2018 11:07   Ct Abdomen Pelvis W Contrast  Result Date: 04/29/2018 CLINICAL DATA:  Mid to lower abdominal pain since yesterday. Nausea and diarrhea. EXAM: CT ABDOMEN AND PELVIS WITH CONTRAST TECHNIQUE: Multidetector CT imaging of the abdomen and pelvis was performed using the standard protocol following bolus administration of intravenous contrast. CONTRAST:  ISOVUE-300 IOPAMIDOL (ISOVUE-300) INJECTION 61% COMPARISON:  02/11/2016 FINDINGS: Lower chest: Circumferential wall thickening noted distal esophagus 6 mm posterior right lower lobe nodule (1/5) unchanged since prior study. Right middle lobe subpleural nodule (9/5) is also stable. More than 2 years of imaging stability in these lesions is compatible with benign etiology. Hepatobiliary: Small low-density lesions in the dome of the liver are stable. Ill-defined  12 mm hypoattenuating focus in the medial right liver (15/2) is unchanged. Liver otherwise unremarkable. There is no evidence for gallstones, gallbladder wall thickening, or pericholecystic fluid. No intrahepatic or extrahepatic biliary dilation. Pancreas: No focal mass lesion. No dilatation of the main duct. No intraparenchymal cyst. No peripancreatic edema. Spleen: Small hypoattenuating lesion in the dome of the spleen is stable. Adrenals/Urinary Tract: 18 mm left adrenal nodule is stable. 14 mm right adrenal nodule is unchanged. Cortical scarring noted inferiorly in the right kidney. Stable 15 mm cyst upper pole left kidney. No evidence for hydroureter. The urinary bladder is markedly distended. Stomach/Bowel: Stomach is nondistended. No gastric wall thickening. No evidence of outlet obstruction. Duodenum is normally positioned as is the ligament of Treitz. No small bowel wall thickening. No small bowel dilatation. The terminal ileum is normal. The appendix is normal. No gross colonic mass. No colonic wall thickening. No substantial diverticular change. Vascular/Lymphatic: There is abdominal aortic atherosclerosis without aneurysm. There is no gastrohepatic or hepatoduodenal ligament lymphadenopathy. No intraperitoneal or retroperitoneal lymphadenopathy. No pelvic sidewall lymphadenopathy. Reproductive: The prostate gland and seminal vesicles have normal imaging features. Other: No intraperitoneal free fluid. Musculoskeletal: Small bilateral groin hernias contain only fat. No worrisome lytic or sclerotic osseous abnormality. IMPRESSION: Circumferential wall thickening distal esophagus. 1. Circumferential esophageal wall thickening. Imaging features likely related to esophagitis although neoplasm can have this appearance. 2. Marked distention of the urinary bladder. Urinary retention not excluded. 3. Stable low-density lesions in the liver and spleen since 02/11/2016, consistent with benign etiology. 4. Stable  bilateral adrenal nodules suggesting adenomas. 5. Small bilateral groin hernias contain only fat. 6.  Aortic Atherosclerois (ICD10-170.0) Electronically Signed   By: Kennith Center M.D.   On: 04/29/2018 12:01    EKG EKG Interpretation  Date/Time:  Monday May 18 2018 16:00:43 EST Ventricular Rate:  100 PR Interval:    QRS Duration: 92 QT Interval:  312 QTC Calculation: 403 R Axis:   69 Text Interpretation:  Sinus tachycardia Nonspecific T wave abnormality No significant change since last tracing Confirmed by Cathren Laine ([id number]) on  05/18/2018 4:09:45 PM   Radiology No results found.  Procedures Procedures (including critical care time)  Medications Ordered in ED Medications  sodium chloride 0.9 % bolus 1,000 mL (has no administration in time range)     Initial Impression / Assessment and Plan / ED Course  I have reviewed the triage vital signs and the nursing notes.  Pertinent labs & imaging results that were available during my care of the patient were reviewed by me and considered in my medical decision making (see chart for details).  Iv ns bolus. Stat labs. Continuous pulse ox and monitor.   Reviewed nursing notes and prior charts for additional history.   Labs reviewed - glucose critically high >800. hco3 is 21. Creatinine is elev.   Iv ns bolus. Insulin gtt per glucose stabilizer.   Additional fluids.   Medicine team consulted for admission.  CRITICAL CARE  RE: severe/critical hyperglycemia with dehydration/Acute kidney injury, insulin gtt.  Performed by: Suzi Roots Total critical care time: 35 minutes Critical care time was exclusive of separately billable procedures and treating other patients. Critical care was necessary to treat or prevent imminent or life-threatening deterioration. Critical care was time spent personally by me on the following activities: development of treatment plan with patient and/or surrogate as well as nursing, discussions  with consultants, evaluation of patient's response to treatment, examination of patient, obtaining history from patient or surrogate, ordering and performing treatments and interventions, ordering and review of laboratory studies, ordering and review of radiographic studies, pulse oximetry and re-evaluation of patient's condition.   Final Clinical Impressions(s) / ED Diagnoses   Final diagnoses:  None    ED Discharge Orders    None       Cathren Laine, MD 05/18/18 1712

## 2018-05-18 NOTE — ED Notes (Signed)
Patient is A&Ox 4 on departure from [facility]; Pt denies pain at this time and states he is independent for ADLs; Patient transported to [facility] via wheelchair with all personal belongings originally brought with him to [facility]-Monique,RN

## 2018-05-19 ENCOUNTER — Encounter (HOSPITAL_COMMUNITY): Payer: Self-pay | Admitting: Emergency Medicine

## 2018-05-19 DIAGNOSIS — E860 Dehydration: Secondary | ICD-10-CM

## 2018-05-19 DIAGNOSIS — E1165 Type 2 diabetes mellitus with hyperglycemia: Secondary | ICD-10-CM

## 2018-05-19 DIAGNOSIS — R739 Hyperglycemia, unspecified: Secondary | ICD-10-CM

## 2018-05-19 LAB — GLUCOSE, CAPILLARY
GLUCOSE-CAPILLARY: 155 mg/dL — AB (ref 70–99)
Glucose-Capillary: 116 mg/dL — ABNORMAL HIGH (ref 70–99)
Glucose-Capillary: 129 mg/dL — ABNORMAL HIGH (ref 70–99)
Glucose-Capillary: 315 mg/dL — ABNORMAL HIGH (ref 70–99)

## 2018-05-19 LAB — BASIC METABOLIC PANEL
Anion gap: 7 (ref 5–15)
BUN: 11 mg/dL (ref 6–20)
CALCIUM: 7.2 mg/dL — AB (ref 8.9–10.3)
CO2: 20 mmol/L — ABNORMAL LOW (ref 22–32)
Chloride: 109 mmol/L (ref 98–111)
Creatinine, Ser: 0.81 mg/dL (ref 0.61–1.24)
GLUCOSE: 153 mg/dL — AB (ref 70–99)
Potassium: 3.6 mmol/L (ref 3.5–5.1)
SODIUM: 136 mmol/L (ref 135–145)

## 2018-05-19 LAB — CBC
HCT: 35.5 % — ABNORMAL LOW (ref 39.0–52.0)
Hemoglobin: 11.1 g/dL — ABNORMAL LOW (ref 13.0–17.0)
MCH: 26.2 pg (ref 26.0–34.0)
MCHC: 31.3 g/dL (ref 30.0–36.0)
MCV: 83.7 fL (ref 80.0–100.0)
NRBC: 0 % (ref 0.0–0.2)
PLATELETS: 180 10*3/uL (ref 150–400)
RBC: 4.24 MIL/uL (ref 4.22–5.81)
RDW: 12.8 % (ref 11.5–15.5)
WBC: 5.4 10*3/uL (ref 4.0–10.5)

## 2018-05-19 LAB — MAGNESIUM: Magnesium: 1.9 mg/dL (ref 1.7–2.4)

## 2018-05-19 MED ORDER — INSULIN ASPART PROT & ASPART (70-30 MIX) 100 UNIT/ML ~~LOC~~ SUSP
45.0000 [IU] | Freq: Every day | SUBCUTANEOUS | Status: DC
  Filled 2018-05-19: qty 10

## 2018-05-19 MED ORDER — INSULIN ASPART PROT & ASPART (70-30 MIX) 100 UNIT/ML ~~LOC~~ SUSP
40.0000 [IU] | Freq: Every day | SUBCUTANEOUS | Status: DC
  Filled 2018-05-19: qty 10

## 2018-05-19 MED ORDER — INSULIN ASPART PROT & ASPART (70-30 MIX) 100 UNIT/ML ~~LOC~~ SUSP: 40.0000 [IU] | SUBCUTANEOUS | Status: AC

## 2018-05-19 NOTE — Progress Notes (Signed)
Inpatient Diabetes Program Recommendations  AACE/ADA: New Consensus Statement on Inpatient Glycemic Control (2015)  Target Ranges:  Prepandial:   less than 140 mg/dL      Peak postprandial:   less than 180 mg/dL (1-2 hours)      Critically ill patients:  140 - 180 mg/dL   Lab Results  Component Value Date   GLUCAP 315 (H) 05/19/2018   HGBA1C 10.7 (H) 04/29/2018    Review of Glycemic Control  Diabetes history: Type 2 Outpatient Diabetes medications: Novolin Relion 70/30 insulin 45 units at breafast, 40 units at HS Current orders for Inpatient glycemic control: Lantus 40 units every HS, Novolog MODERATE correction scale TID & HS  Inpatient Diabetes Program Recommendations:   Spoke with patient about his diabetes. Was diagnosed in 2006. [facility]. States that he takes Novolin Relion [employer] 70/30 insulin 45 units at breakfast and 40 units at HS. States that he became ill with N & V, chills, etc. That caused him to have high blood sugars. States that he does check blood sugars at home.Was last seen by our inpatient Glycemic team at last admission on 04/30/2018.  Noted that blood sugar was 50 mg/dl last night at [id number] after getting Novolog 30 units in the ED at . [facility] according to progress notes. Recommend starting patient back on home dose of insulin while in the hospital if patient is eating at least 50% of meals.   Smith MinceKendra Illiana Losurdo RN BSN CDE Diabetes Coordinator Pager: [phone]  8am-5pm

## 2018-05-19 NOTE — Progress Notes (Signed)
Hypoglycemic Event  CBG: 50  Treatment: 15 GM carbohydrate snack  Symptoms: Shaky  Follow-up CBG: Time 2341 CBG  Result:66   Possible Reasons for Event: Unknown  Comments/MD notified:Mid-level, Schorr, no orders received.     Vicente MalesEunice O Yukio Bisping

## 2018-05-19 NOTE — Discharge Summary (Signed)
Physician Discharge Summary  Rickey Smith ZOX:[id number] DOB: August 15, 1965 DOA: 05/18/2018  PCP: Rickey Matar, MD  Admit date: 05/18/2018 Discharge date: 05/19/2018  Admitted From: home Discharge disposition: home   Recommendations for Outpatient Follow-Up:   1. Encouraged consistency with diabetic regimen   Discharge Diagnosis:   Principal Problem:   Hyperglycemia Active Problems:   Diabetic neuropathy (HCC)   Polysubstance abuse (HCC)   Uncontrolled type 2 diabetes mellitus with hyperglycemia, with long-term current use of insulin (HCC)   Hyperlipidemia   AKI (acute kidney injury) (HCC)    Discharge Condition: Improved.  Diet recommendation: Low sodium, heart healthy.  Carbohydrate-modified  Wound care: None.  Code status: Full.   History of Present Illness:   Rickey Smith is a 52 y.o. male with medical history significant for IDDM, substance use and poor compliance with medication who presented to ED with hyperglycemia.  Patient was recently hospitalized from 11 5-11 8 for DKA.  He was discharged on insulin 70/30.  He reports good compliance with medication.  He states that his CBG was 90 this morning.  He presented to wellness clinic this afternoon for hospital follow-up.  He felt lightheaded when he got up from his chair.  CBG was checked and was too high to detect.  So he was sent to ED.  Patient reports good compliance with his insulin.  He denies recent illness other than just some nasal congestion, cough, cold and chills that have resolved.  Currently, he denies chest pain, fever, cough, dyspnea, palpitation, nausea, vomiting, abdominal pain, diarrhea, dysuria, polyuria or new focal weakness, numbness or tingling.  He admits polydipsia.  He reports drinking about 2 gallons of water a day.  He reports feeling better after he was given normal saline bolus in ED.  Lives alone.  Denies smoking cigarettes and drinking alcohol.  Admits using  cocaine.  Last use about 2 weeks ago.  In ED, vital signs not impressive.  CBG greater than 600.  CMP significant for  Glucose to 819, sodium to 122 (corrects to normal), bicarb 21, anion gap 13, serum creatinine 1.18 (baseline 0.62).  CBC not impressive.  EKG done in ED but incomplete image interpret.  Portable chest x-ray without significant cardiopulmonary finding.  Urinalysis ordered but not collected.  IV fluid and insulin drip ordered by EDP.  Hospitalist service was called for admission.     Hospital Course by Problem:   Hyperglycemia/DM 2:  -Suspect poor compliance with his medication.  A1c 10.4% about 2 weeks ago.  CBG greater than 600.  Elevated glucose to 819 on BMP.  Does not meet criteria for DKA or HHS.  Bicarb normal.  No significant anion gap.  Overall, patient is clinically well-appearing. -resume home meds -encourage compliance as well as consistency with diet and medications  Hyponatremia:  -resolved with IVF and correction of blood sugar  AKI: Likely due to dehydration -resolved with IVF     Medical Consultants:   Diabetic coordinator   Discharge Exam:   Vitals:   05/19/18 0605 05/19/18 1140  BP: (!) 93/58 134/84  Pulse: 73 87  Resp: 16 16  Temp: 97.6 F (36.4 C) 98.7 F (37.1 C)  SpO2: 99% 96%   Vitals:   05/18/18 2022 05/18/18 2333 05/19/18 0605 05/19/18 1140  BP: 107/71 110/68 (!) 93/58 134/84  Pulse: 99 91 73 87  Resp: 18 16 16 16   Temp: 98.2 F (36.8 C) 98.4 F (36.9 C) 97.6 F (36.4  C) 98.7 F (37.1 C)  TempSrc: Oral Oral Oral Oral  SpO2: 99% 98% 99% 96%  Weight: 68.6 kg     Height: 6\' 1"  (1.854 m)       General exam: Appears calm and comfortable.   The results of significant diagnostics from this hospitalization (including imaging, microbiology, ancillary and laboratory) are listed below for reference.     Procedures and Diagnostic Studies:   Dg Chest Port 1 View  Result Date: 05/18/2018 CLINICAL DATA:  52 y/o  M;  hyperglycemia and tachycardia.  Cough. EXAM: PORTABLE CHEST 1 VIEW COMPARISON:  04/28/2018 chest radiograph. FINDINGS: Stable heart size and mediastinal contours are within normal limits. Both lungs are clear. The visualized skeletal structures are unremarkable. IMPRESSION: No active disease. Electronically Signed   By: Mitzi Hansen M.D.   On: 05/18/2018 18:00     Labs:   Basic Metabolic Panel: Recent Labs  Lab 05/18/18 1615 05/18/18 2107 05/19/18 0709  NA 122* 135 136  K 4.7 3.2* 3.6  CL 88* 104 109  CO2 21* 24 20*  GLUCOSE 819* 205* 153*  BUN 31* 21* 11  CREATININE 1.18 0.94 0.81  CALCIUM 8.4* 7.2* 7.2*  MG  --   --  1.9   GFR Estimated Creatinine Clearance: 104.7 mL/min (by C-G formula based on SCr of 0.81 mg/dL). Liver Function Tests: Recent Labs  Lab 05/18/18 1615  AST 18  ALT 14  ALKPHOS 80  BILITOT 0.4  PROT 6.2*  ALBUMIN 3.4*   No results for input(s): LIPASE, AMYLASE in the last 168 hours. No results for input(s): AMMONIA in the last 168 hours. Coagulation profile No results for input(s): INR, PROTIME in the last 168 hours.  CBC: Recent Labs  Lab 05/18/18 1615 05/19/18 0709  WBC 8.7 5.4  HGB 12.7* 11.1*  HCT 40.7 35.5*  MCV 83.9 83.7  PLT 284 180   Cardiac Enzymes: No results for input(s): CKTOTAL, CKMB, CKMBINDEX, TROPONINI in the last 168 hours. BNP: Invalid input(s): POCBNP CBG: Recent Labs  Lab 05/18/18 2341 05/18/18 2359 05/19/18 0349 05/19/18 0712 05/19/18 1138  GLUCAP 66* 116* 155* 129* 315*   D-Dimer No results for input(s): DDIMER in the last 72 hours. Hgb A1c No results for input(s): HGBA1C in the last 72 hours. Lipid Profile No results for input(s): CHOL, HDL, LDLCALC, TRIG, CHOLHDL, LDLDIRECT in the last 72 hours. Thyroid function studies No results for input(s): TSH, T4TOTAL, T3FREE, THYROIDAB in the last 72 hours.  Invalid input(s): FREET3 Anemia work up No results for input(s): VITAMINB12, FOLATE,  FERRITIN, TIBC, IRON, RETICCTPCT in the last 72 hours. Microbiology No results found for this or any previous visit (from the past 240 hour(s)).   Discharge Instructions:   Discharge Instructions    Diet - low sodium heart healthy   Complete by:  As directed    Diet Carb Modified   Complete by:  As directed    Discharge instructions   Complete by:  As directed    Bring log of blood sugars to PCP for medication adjustments Be mindful of fluid intake as well as do not skip meals   Increase activity slowly   Complete by:  As directed      Allergies as of 05/19/2018      Reactions   Gabapentin Diarrhea, Nausea And Vomiting   Had to be hospitalized because of the reaction(s)   Codeine Itching   Penicillins Hives, Rash   Has patient had a PCN reaction causing immediate rash,  facial/tongue/throat swelling, SOB or lightheadedness with hypotension: Yes Has patient had a PCN reaction causing severe rash involving mucus membranes or skin necrosis: Unk Has patient had a PCN reaction that required hospitalization: Unk Has patient had a PCN reaction occurring within the last 10 years: Yes If all of the above answers are "NO", then may proceed with Cephalosporin use.      Medication List    TAKE these medications   amitriptyline 75 MG tablet Commonly known as:  ELAVIL Take 1 tablet (75 mg total) by mouth at bedtime.   amLODipine 5 MG tablet Commonly known as:  NORVASC Take 1 tablet (5 mg total) by mouth daily. What changed:  when to take this   insulin aspart protamine- aspart (70-30) 100 UNIT/ML injection Commonly known as:  NOVOLOG MIX 70/30 Inject 0.4-0.45 mLs (40-45 Units total) into the skin See admin instructions. Injects 45 units subcutaneously daily before breakfast  and 40 units at bedtime   pantoprazole 40 MG tablet Commonly known as:  PROTONIX Take 1 tablet (40 mg total) by mouth 2 (two) times daily before a meal.   sucralfate 1 GM/10ML suspension Commonly known as:   CARAFATE Take 10 mLs (1 g total) by mouth 4 (four) times daily -  with meals and at bedtime.      Follow-up Information    Rickey MatarJohnson, Deborah B, MD Follow up in 1 week(s).   Specialty:  Internal Medicine Contact information: [address] [place] [address] [phone]            Time coordinating discharge: 25 min  Signed:  Joseph ArtJessica U Laylia Mui DO  [facility] 05/19/2018, 1:44 PM

## 2018-08-19 ENCOUNTER — Observation Stay (HOSPITAL_COMMUNITY): Payer: Self-pay

## 2018-08-19 ENCOUNTER — Inpatient Hospital Stay (HOSPITAL_COMMUNITY)
Admission: EM | Admit: 2018-08-19 | Discharge: 2018-08-21 | DRG: 074 | Disposition: A | Discharge status: Home / Self Care | Payer: Self-pay | Attending: Internal Medicine | Admitting: Internal Medicine

## 2018-08-19 ENCOUNTER — Encounter (HOSPITAL_COMMUNITY): Payer: Self-pay | Admitting: *Deleted

## 2018-08-19 DIAGNOSIS — K219 Gastro-esophageal reflux disease without esophagitis: Secondary | ICD-10-CM | POA: Diagnosis present

## 2018-08-19 DIAGNOSIS — F1490 Cocaine use, unspecified, uncomplicated: Secondary | ICD-10-CM | POA: Diagnosis present

## 2018-08-19 DIAGNOSIS — R112 Nausea with vomiting, unspecified: Secondary | ICD-10-CM | POA: Diagnosis present

## 2018-08-19 DIAGNOSIS — Z888 Allergy status to other drugs, medicaments and biological substances status: Secondary | ICD-10-CM

## 2018-08-19 DIAGNOSIS — R1114 Bilious vomiting: Secondary | ICD-10-CM

## 2018-08-19 DIAGNOSIS — Z885 Allergy status to narcotic agent status: Secondary | ICD-10-CM

## 2018-08-19 DIAGNOSIS — Z9079 Acquired absence of other genital organ(s): Secondary | ICD-10-CM

## 2018-08-19 DIAGNOSIS — K922 Gastrointestinal hemorrhage, unspecified: Secondary | ICD-10-CM | POA: Diagnosis present

## 2018-08-19 DIAGNOSIS — E1165 Type 2 diabetes mellitus with hyperglycemia: Secondary | ICD-10-CM | POA: Diagnosis present

## 2018-08-19 DIAGNOSIS — E1142 Type 2 diabetes mellitus with diabetic polyneuropathy: Secondary | ICD-10-CM | POA: Diagnosis present

## 2018-08-19 DIAGNOSIS — Z8249 Family history of ischemic heart disease and other diseases of the circulatory system: Secondary | ICD-10-CM

## 2018-08-19 DIAGNOSIS — R1110 Vomiting, unspecified: Secondary | ICD-10-CM

## 2018-08-19 DIAGNOSIS — E872 Acidosis: Secondary | ICD-10-CM | POA: Diagnosis present

## 2018-08-19 DIAGNOSIS — E1110 Type 2 diabetes mellitus with ketoacidosis without coma: Secondary | ICD-10-CM

## 2018-08-19 DIAGNOSIS — Z87891 Personal history of nicotine dependence: Secondary | ICD-10-CM

## 2018-08-19 DIAGNOSIS — Z880 Allergy status to penicillin: Secondary | ICD-10-CM

## 2018-08-19 DIAGNOSIS — R109 Unspecified abdominal pain: Secondary | ICD-10-CM | POA: Diagnosis present

## 2018-08-19 DIAGNOSIS — E1143 Type 2 diabetes mellitus with diabetic autonomic (poly)neuropathy: Principal | ICD-10-CM | POA: Diagnosis present

## 2018-08-19 DIAGNOSIS — K3184 Gastroparesis: Secondary | ICD-10-CM | POA: Diagnosis present

## 2018-08-19 DIAGNOSIS — I10 Essential (primary) hypertension: Secondary | ICD-10-CM | POA: Diagnosis present

## 2018-08-19 DIAGNOSIS — Z794 Long term (current) use of insulin: Secondary | ICD-10-CM

## 2018-08-19 DIAGNOSIS — R933 Abnormal findings on diagnostic imaging of other parts of digestive tract: Secondary | ICD-10-CM | POA: Diagnosis present

## 2018-08-19 DIAGNOSIS — Z79899 Other long term (current) drug therapy: Secondary | ICD-10-CM

## 2018-08-19 LAB — RAPID URINE DRUG SCREEN, HOSP PERFORMED
Amphetamines: NOT DETECTED
BARBITURATES: NOT DETECTED
Benzodiazepines: NOT DETECTED
COCAINE: POSITIVE — AB
OPIATES: POSITIVE — AB
Tetrahydrocannabinol: NOT DETECTED

## 2018-08-19 LAB — CBG MONITORING, ED
GLUCOSE-CAPILLARY: 259 mg/dL — AB (ref 70–99)
Glucose-Capillary: 363 mg/dL — ABNORMAL HIGH (ref 70–99)

## 2018-08-19 LAB — COMPREHENSIVE METABOLIC PANEL
ALT: 15 U/L (ref 0–44)
ANION GAP: 18 — AB (ref 5–15)
AST: 16 U/L (ref 15–41)
Albumin: 3.7 g/dL (ref 3.5–5.0)
Alkaline Phosphatase: 69 U/L (ref 38–126)
BUN: 10 mg/dL (ref 6–20)
CHLORIDE: 96 mmol/L — AB (ref 98–111)
CO2: 21 mmol/L — AB (ref 22–32)
Calcium: 9.4 mg/dL (ref 8.9–10.3)
Creatinine, Ser: 0.88 mg/dL (ref 0.61–1.24)
GFR calc non Af Amer: >60 mL/min (ref >60)
Glucose, Bld: 214 mg/dL — ABNORMAL HIGH (ref 70–99)
Potassium: 3.6 mmol/L (ref 3.5–5.1)
SODIUM: 135 mmol/L (ref 135–145)
Total Bilirubin: 1 mg/dL (ref 0.3–1.2)
Total Protein: 7.2 g/dL (ref 6.5–8.1)

## 2018-08-19 LAB — URINALYSIS, ROUTINE W REFLEX MICROSCOPIC
Bacteria, UA: NONE SEEN
Bilirubin Urine: NEGATIVE
Glucose, UA: >=500 mg/dL — AB
HGB URINE DIPSTICK: NEGATIVE
Ketones, ur: 80 mg/dL — AB
Leukocytes,Ua: NEGATIVE
Nitrite: NEGATIVE
Protein, ur: 30 mg/dL — AB
SPECIFIC GRAVITY, URINE: 1.03 (ref 1.005–1.030)
pH: 8 (ref 5.0–8.0)

## 2018-08-19 LAB — CBC
HEMATOCRIT: 41.8 % (ref 39.0–52.0)
Hemoglobin: 14.2 g/dL (ref 13.0–17.0)
MCH: 26.8 pg (ref 26.0–34.0)
MCHC: 34 g/dL (ref 30.0–36.0)
MCV: 79 fL — AB (ref 80.0–100.0)
NRBC: 0 % (ref 0.0–0.2)
Platelets: 325 10*3/uL (ref 150–400)
RBC: 5.29 MIL/uL (ref 4.22–5.81)
RDW: 13.3 % (ref 11.5–15.5)
WBC: 11.6 10*3/uL — ABNORMAL HIGH (ref 4.0–10.5)

## 2018-08-19 LAB — I-STAT TROPONIN, ED: TROPONIN I, POC: 0.01 ng/mL (ref 0.00–0.08)

## 2018-08-19 LAB — OCCULT BLOOD GASTRIC / DUODENUM (SPECIMEN CUP): Occult Blood, Gastric: POSITIVE — AB

## 2018-08-19 LAB — LIPASE, BLOOD: LIPASE: 29 U/L (ref 11–51)

## 2018-08-19 MED ORDER — METOCLOPRAMIDE HCL 5 MG/ML IJ SOLN
10.0000 mg | Freq: Once | INTRAMUSCULAR | Status: AC
  Administered 2018-08-19: 10 mg via INTRAVENOUS
  Filled 2018-08-19: qty 2

## 2018-08-19 MED ORDER — PROMETHAZINE HCL 25 MG/ML IJ SOLN
12.5000 mg | Freq: Four times a day (QID) | INTRAMUSCULAR | Status: DC | PRN
  Administered 2018-08-20 (×2): 25 mg via INTRAVENOUS
  Filled 2018-08-19 (×2): qty 1

## 2018-08-19 MED ORDER — DEXTROSE-NACL 5-0.45 % IV SOLN: INTRAVENOUS | Status: DC

## 2018-08-19 MED ORDER — INSULIN REGULAR(HUMAN) IN NACL 100-0.9 UT/100ML-% IV SOLN
INTRAVENOUS | Status: DC
  Administered 2018-08-19: 3 [IU]/h via INTRAVENOUS
  Administered 2018-08-20: 5.4 [IU]/h via INTRAVENOUS
  Filled 2018-08-19 (×2): qty 100

## 2018-08-19 MED ORDER — SODIUM CHLORIDE 0.9 % IV SOLN
INTRAVENOUS | Status: DC
  Administered 2018-08-19: 23:00:00 via INTRAVENOUS

## 2018-08-19 MED ORDER — SODIUM CHLORIDE 0.9 % IV BOLUS
1000.0000 mL | Freq: Once | INTRAVENOUS | Status: AC
  Administered 2018-08-19: 1000 mL via INTRAVENOUS

## 2018-08-19 MED ORDER — ONDANSETRON HCL 4 MG/2ML IJ SOLN
4.0000 mg | Freq: Once | INTRAMUSCULAR | Status: AC
  Administered 2018-08-19: 4 mg via INTRAVENOUS
  Filled 2018-08-19: qty 2

## 2018-08-19 MED ORDER — PANTOPRAZOLE SODIUM 40 MG IV SOLR: 40.0000 mg | INTRAVENOUS | Status: DC

## 2018-08-19 MED ORDER — ONDANSETRON HCL 4 MG/2ML IJ SOLN
4.0000 mg | Freq: Four times a day (QID) | INTRAMUSCULAR | Status: DC | PRN
  Administered 2018-08-20 (×2): 4 mg via INTRAVENOUS
  Filled 2018-08-19 (×2): qty 2

## 2018-08-19 MED ORDER — IOHEXOL 300 MG/ML  SOLN
100.0000 mL | Freq: Once | INTRAMUSCULAR | Status: AC | PRN
  Administered 2018-08-19: 100 mL via INTRAVENOUS

## 2018-08-19 MED ORDER — SODIUM CHLORIDE 0.9% FLUSH: 3.0000 mL | Freq: Once | INTRAVENOUS | Status: DC

## 2018-08-19 NOTE — ED Notes (Addendum)
Pt in [employer]; informed he needs to provide a UA cup.

## 2018-08-19 NOTE — ED Notes (Signed)
Please note: Pt did not reply when called for by this tech so he could be handed a UA cup.

## 2018-08-19 NOTE — ED Triage Notes (Signed)
Pt in c/o vomiting since last night with abdominal pain, pt is diabetic and reports glucose levels in the 200s last night

## 2018-08-19 NOTE — ED Provider Notes (Signed)
[facility] EMERGENCY DEPARTMENT Provider Note   CSN: [id number] Arrival date & time: 08/19/18  1208    History   Chief Complaint Chief Complaint  Patient presents with  . 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     The history is provided by the patient and medical records. No language interpreter was used.  Emesis     53 year old male with hx of DM, CHF here with nausea and vomiting.  Patient reports since 2 AM this morning he has had persistent vomiting.  He reports bilious vomiting content without any blood.  States he has had more than 10 episodes.  He feels weak, tired and having some chills.  He felt that symptom is similar to prior DKA that he had in the past.  He denies any fever, recent sickness, pain in his chest, trouble breathing, productive cough, dysuria, bowel bladder changes, constipation or diarrhea.  He denies any tobacco use, alcohol use or drug use.  States he has been compliant with his medication.  Endorses mild upper abdominal pain only when vomits.      Past Medical History:  Diagnosis Date  . CHF (congestive heart failure) (HCC)    "think I have this before" (11/11/2017)  . Diabetic peripheral neuropathy (HCC)   . Migraine    "once q month or 2" (11/11/2017)  . Type II diabetes mellitus [facility])     Patient Active Problem List   Diagnosis Date Noted  . Hyperglycemia 05/18/2018  . Abnormal CT scan, esophagus   . Hypokalemia 04/29/2018  . Dehydration   . AKI (acute kidney injury) (HCC) 11/10/2017  . DKA (diabetic ketoacidoses) (HCC) 08/17/2017  . Hyperlipidemia 06/26/2017  . Uncontrolled type 2 diabetes mellitus with hyperglycemia, with long-term current use of insulin (HCC) 12/28/2016  . Cocaine use 12/28/2016  . Polysubstance abuse (HCC) 04/30/2016  . Diabetic neuropathy (HCC) 08/10/2015  . Tachycardia 08/02/2015  . GERD (gastroesophageal reflux disease) 10/26/2014  . Hyperkalemia 10/23/2014  . Ex-smoker  04/26/2014    Past Surgical History:  Procedure Laterality Date  . TRANSURETHRAL RESECTION OF PROSTATE N/A 08/16/2015   Procedure: TRANSURETHRAL RESECTION DRAINAGE OF PROSTATE ABCESS;  Surgeon: Barron Alvine, MD;  Location: [facility] ORS;  Service: Urology;  Laterality: N/A;        Home Medications    Prior to Admission medications   Medication Sig Start Date End Date Taking? Authorizing Provider  amitriptyline (ELAVIL) 75 MG tablet Take 1 tablet (75 mg total) by mouth at bedtime. 04/16/17   Vivianne Master, PA-C  amLODipine (NORVASC) 5 MG tablet Take 1 tablet (5 mg total) by mouth daily. Patient taking differently: Take 5 mg by mouth at bedtime.  05/01/18   Catarina Hartshorn, MD  insulin aspart protamine- aspart (NOVOLOG MIX 70/30) (70-30) 100 UNIT/ML injection Inject 0.4-0.45 mLs (40-45 Units total) into the skin See admin instructions. Injects 45 units subcutaneously daily before breakfast  and 40 units at bedtime 05/19/18   Marlin Canary U, DO  pantoprazole (PROTONIX) 40 MG tablet Take 1 tablet (40 mg total) by mouth 2 (two) times daily before a meal. 05/01/18   Tat, Onalee Hua, MD  sucralfate (CARAFATE) 1 GM/10ML suspension Take 10 mLs (1 g total) by mouth 4 (four) times daily -  with meals and at bedtime. 05/01/18   Catarina Hartshorn, MD    Family History Family History  Problem Relation Age of Onset  . Heart attack Unknown   . Prostate  cancer Unknown   . Hypertension Unknown   . Hypertension Mother   . Cancer Father     Social History Social History   Tobacco Use  . Smoking status: Former Smoker    Packs/day: 0.50    Years: 34.00    Pack years: 17.00    Types: Cigarettes    Last attempt to quit: 02/2017    Years since quitting: 1.4  . Smokeless tobacco: Never Used  Substance Use Topics  . Alcohol use: Not Currently  . Drug use: Yes    Types: Cocaine, Heroin    Comment: 11/11/2017      Allergies   Gabapentin; Codeine; and Penicillins   Review of Systems Review of Systems    Gastrointestinal: Positive for vomiting.  All other systems reviewed and are negative.    Physical Exam Updated Vital Signs BP (!) 190/98 (BP Location: Right Arm) Comment: Simultaneous filing. User may not have seen previous data.  Pulse 100 Comment: Simultaneous filing. User may not have seen previous data.  Temp 97.9 F (36.6 C) (Oral)   Resp (!) 26 Comment: Simultaneous filing. User may not have seen previous data.  SpO2 100% Comment: Simultaneous filing. User may not have seen previous data.  Physical Exam Vitals signs and nursing note reviewed.  Constitutional:      General: He is not in acute distress.    Appearance: He is well-developed.  HENT:     Head: Atraumatic.     Comments: Patient is edentulous, mouth is dry. Eyes:     Extraocular Movements: Extraocular movements intact.     Conjunctiva/sclera: Conjunctivae normal.     Pupils: Pupils are equal, round, and reactive to light.  Neck:     Musculoskeletal: Neck supple. No neck rigidity.  Cardiovascular:     Rate and Rhythm: Tachycardia present.     Heart sounds: No murmur.  Pulmonary:     Effort: Pulmonary effort is normal.     Breath sounds: Normal breath sounds.  Abdominal:     General: Abdomen is flat.     Palpations: Abdomen is soft.     Tenderness: There is no abdominal tenderness.  Musculoskeletal:        General: No tenderness.  Lymphadenopathy:     Cervical: No cervical adenopathy.  Skin:    General: Skin is warm.     Findings: No rash.  Neurological:     Mental Status: He is alert and oriented to person, place, and time.  Psychiatric:        Mood and Affect: Mood normal.      ED Treatments / Results  Labs (all labs ordered are listed, but only abnormal results are displayed) Labs Reviewed  COMPREHENSIVE METABOLIC PANEL - Abnormal; Notable for the following components:      Result Value   Chloride 96 (*)    CO2 21 (*)    Glucose, Bld 214 (*)    Anion gap 18 (*)    All other components  within normal limits  CBC - Abnormal; Notable for the following components:   WBC 11.6 (*)    MCV 79.0 (*)    All other components within normal limits  URINALYSIS, ROUTINE W REFLEX MICROSCOPIC - Abnormal; Notable for the following components:   Glucose, UA >=500 (*)    Ketones, ur 80 (*)    Protein, ur 30 (*)    All other components within normal limits  RAPID URINE DRUG SCREEN, HOSP PERFORMED - Abnormal; Notable for the following  components:   Opiates POSITIVE (*)    Cocaine POSITIVE (*)    All other components within normal limits  CBG MONITORING, ED - Abnormal; Notable for the following components:   Glucose-Capillary 363 (*)    All other components within normal limits  LIPASE, BLOOD  BASIC METABOLIC PANEL  BLOOD GAS, VENOUS  I-STAT TROPONIN, ED    EKG EKG Interpretation  Date/Time:  Wednesday August 19 2018 21:15:24 EST Ventricular Rate:  118 PR Interval:    QRS Duration: 85 QT Interval:  344 QTC Calculation: 482 R Axis:   74 Text Interpretation:  Sinus tachycardia Ventricular premature complex Aberrant complex RAE, consider biatrial enlargement Consider left ventricular hypertrophy Borderline prolonged QT interval Confirmed by Vanetta Mulders [phone]) on 08/19/2018 9:49:14 PM   Radiology No results found.  Procedures .Critical Care Performed by: Fayrene Helper, PA-C Authorized by: Fayrene Helper, PA-C   Critical care provider statement:    Critical care time (minutes):  45   Critical care was time spent personally by me on the following activities:  Discussions with consultants, evaluation of patient's response to treatment, examination of patient, ordering and performing treatments and interventions, ordering and review of laboratory studies, ordering and review of radiographic studies, pulse oximetry, re-evaluation of patient's condition, obtaining history from patient or surrogate and review of old charts   (including critical care time)  Medications Ordered in  ED Medications  sodium chloride flush (NS) 0.9 % injection 3 mL (has no administration in time range)  dextrose 5 %-0.45 % sodium chloride infusion (has no administration in time range)  insulin regular, human (MYXREDLIN) 100 units/ 100 mL infusion (3 Units/hr Intravenous New Bag/Given 08/19/18 2149)  sodium chloride 0.9 % bolus 1,000 mL (1,000 mLs Intravenous New Bag/Given 08/19/18 2147)    And  0.9 %  sodium chloride infusion (has no administration in time range)  sodium chloride 0.9 % bolus 1,000 mL (1,000 mLs Intravenous New Bag/Given 08/19/18 1935)  metoCLOPramide (REGLAN) injection 10 mg (10 mg Intravenous Given 08/19/18 1936)  sodium chloride 0.9 % bolus 1,000 mL (1,000 mLs Intravenous New Bag/Given 08/19/18 2155)  ondansetron (ZOFRAN) injection 4 mg (4 mg Intravenous Given 08/19/18 2152)     Initial Impression / Assessment and Plan / ED Course  I have reviewed the triage vital signs and the nursing notes.  Pertinent labs & imaging results that were available during my care of the patient were reviewed by me and considered in my medical decision making (see chart for details).        BP (!) 164/90   Pulse (!) 103   Temp 97.9 F (36.6 C) (Oral)   Resp (!) 37   SpO2 100%    Final Clinical Impressions(s) / ED Diagnoses   Final diagnoses:  Lactic acidosis due to diabetes mellitus (HCC)  Bilious vomiting with nausea    ED Discharge Orders    None     8:59 PM Patient complaining of nausea and vomiting and states it feels similar to prior DKA.  Does not have any significant abdominal discomfort on exam.  Is mildly tachycardic afebrile.  Labs remarkable for mildly elevated glucose of 240 however with an anion gap of 18 bicarb of 21.  Will check urine for ketones.  Has had recent sickness.  Plan to obtain EKG and troponin as well.  9:26 PM UA shows 80 of ketones concerning for DKA.  Will place patient glucose stabilizer to help close his anion gap and correct acidosis.  EKG  with mild prolonged QT.  Will avoid agents that can worsen his QT. Care discussed with DR. Deretha EmoryZackowski.   10:56 PM Appreciate consultation from [facility] Dr. Antionette Charpyd who felt pt's metabolic acidosis is less likely DKA given CBG is not significantly elevated.  He request repeat BMP and he will see and admit pt.  I will also add a VBG to check blood gas.  Although pt denies substance use, he tested positive for cocaine.     Fayrene Helperran, Jilliane Kazanjian, PA-C 08/19/18 2259    Vanetta MuldersZackowski, Scott, MD 08/20/18 [phone]

## 2018-08-20 ENCOUNTER — Other Ambulatory Visit: Payer: Self-pay

## 2018-08-20 ENCOUNTER — Encounter (HOSPITAL_COMMUNITY): Payer: Self-pay | Admitting: Family Medicine

## 2018-08-20 DIAGNOSIS — R109 Unspecified abdominal pain: Secondary | ICD-10-CM

## 2018-08-20 DIAGNOSIS — E1165 Type 2 diabetes mellitus with hyperglycemia: Secondary | ICD-10-CM

## 2018-08-20 DIAGNOSIS — K922 Gastrointestinal hemorrhage, unspecified: Secondary | ICD-10-CM | POA: Diagnosis present

## 2018-08-20 DIAGNOSIS — R1110 Vomiting, unspecified: Secondary | ICD-10-CM

## 2018-08-20 DIAGNOSIS — Z794 Long term (current) use of insulin: Secondary | ICD-10-CM

## 2018-08-20 DIAGNOSIS — F1490 Cocaine use, unspecified, uncomplicated: Secondary | ICD-10-CM

## 2018-08-20 DIAGNOSIS — I10 Essential (primary) hypertension: Secondary | ICD-10-CM | POA: Diagnosis present

## 2018-08-20 DIAGNOSIS — R112 Nausea with vomiting, unspecified: Secondary | ICD-10-CM

## 2018-08-20 LAB — BASIC METABOLIC PANEL
ANION GAP: 9 (ref 5–15)
ANION GAP: 16 — AB (ref 5–15)
Anion gap: 10 (ref 5–15)
Anion gap: 17 — ABNORMAL HIGH (ref 5–15)
Anion gap: 10 (ref 5–15)
Anion gap: 7 (ref 5–15)
BUN: 11 mg/dL (ref 6–20)
BUN: 11 mg/dL (ref 6–20)
BUN: 11 mg/dL (ref 6–20)
BUN: 6 mg/dL (ref 6–20)
BUN: 7 mg/dL (ref 6–20)
BUN: 9 mg/dL (ref 6–20)
CALCIUM: 7.6 mg/dL — AB (ref 8.9–10.3)
CALCIUM: 7.9 mg/dL — AB (ref 8.9–10.3)
CHLORIDE: 101 mmol/L (ref 98–111)
CO2: 21 mmol/L — ABNORMAL LOW (ref 22–32)
CO2: 16 mmol/L — ABNORMAL LOW (ref 22–32)
CO2: 19 mmol/L — ABNORMAL LOW (ref 22–32)
CO2: 20 mmol/L — ABNORMAL LOW (ref 22–32)
CO2: 23 mmol/L (ref 22–32)
CO2: 17 mmol/L — AB (ref 22–32)
CREATININE: 0.74 mg/dL (ref 0.61–1.24)
Calcium: 7.7 mg/dL — ABNORMAL LOW (ref 8.9–10.3)
Calcium: 7.9 mg/dL — ABNORMAL LOW (ref 8.9–10.3)
Calcium: 8 mg/dL — ABNORMAL LOW (ref 8.9–10.3)
Calcium: 8.2 mg/dL — ABNORMAL LOW (ref 8.9–10.3)
Chloride: 105 mmol/L (ref 98–111)
Chloride: 100 mmol/L (ref 98–111)
Chloride: 103 mmol/L (ref 98–111)
Chloride: 101 mmol/L (ref 98–111)
Chloride: 102 mmol/L (ref 98–111)
Creatinine, Ser: 0.69 mg/dL (ref 0.61–1.24)
Creatinine, Ser: 0.72 mg/dL (ref 0.61–1.24)
Creatinine, Ser: 0.84 mg/dL (ref 0.61–1.24)
Creatinine, Ser: 0.92 mg/dL (ref 0.61–1.24)
Creatinine, Ser: 0.98 mg/dL (ref 0.61–1.24)
GFR calc Af Amer: >60 mL/min (ref >60)
GFR calc Af Amer: >60 mL/min (ref >60)
GFR calc Af Amer: >60 mL/min (ref >60)
GFR calc Af Amer: >60 mL/min (ref >60)
GFR calc Af Amer: >60 mL/min (ref >60)
GFR calc Af Amer: >60 mL/min (ref >60)
GFR calc non Af Amer: >60 mL/min (ref >60)
GFR calc non Af Amer: >60 mL/min (ref >60)
GFR calc non Af Amer: >60 mL/min (ref >60)
GFR calc non Af Amer: >60 mL/min (ref >60)
GFR calc non Af Amer: >60 mL/min (ref >60)
Glucose, Bld: 142 mg/dL — ABNORMAL HIGH (ref 70–99)
Glucose, Bld: 149 mg/dL — ABNORMAL HIGH (ref 70–99)
Glucose, Bld: 195 mg/dL — ABNORMAL HIGH (ref 70–99)
Glucose, Bld: 197 mg/dL — ABNORMAL HIGH (ref 70–99)
Glucose, Bld: 248 mg/dL — ABNORMAL HIGH (ref 70–99)
Glucose, Bld: 258 mg/dL — ABNORMAL HIGH (ref 70–99)
Potassium: 2.9 mmol/L — ABNORMAL LOW (ref 3.5–5.1)
Potassium: 3 mmol/L — ABNORMAL LOW (ref 3.5–5.1)
Potassium: 3.1 mmol/L — ABNORMAL LOW (ref 3.5–5.1)
Potassium: 3.2 mmol/L — ABNORMAL LOW (ref 3.5–5.1)
Potassium: 3.4 mmol/L — ABNORMAL LOW (ref 3.5–5.1)
Potassium: 3.4 mmol/L — ABNORMAL LOW (ref 3.5–5.1)
SODIUM: 136 mmol/L (ref 135–145)
SODIUM: 133 mmol/L — AB (ref 135–145)
Sodium: 131 mmol/L — ABNORMAL LOW (ref 135–145)
Sodium: 131 mmol/L — ABNORMAL LOW (ref 135–145)
Sodium: 132 mmol/L — ABNORMAL LOW (ref 135–145)
Sodium: 134 mmol/L — ABNORMAL LOW (ref 135–145)

## 2018-08-20 LAB — CBC WITH DIFFERENTIAL/PLATELET
Abs Immature Granulocytes: 0.06 10*3/uL (ref 0.00–0.07)
BASOS PCT: 0 %
Basophils Absolute: 0 10*3/uL (ref 0.0–0.1)
Eosinophils Absolute: 0 10*3/uL (ref 0.0–0.5)
Eosinophils Relative: 0 %
HCT: 37.9 % — ABNORMAL LOW (ref 39.0–52.0)
Hemoglobin: 12.4 g/dL — ABNORMAL LOW (ref 13.0–17.0)
Immature Granulocytes: 0 %
Lymphocytes Relative: 7 %
Lymphs Abs: 1 10*3/uL (ref 0.7–4.0)
MCH: 26.3 pg (ref 26.0–34.0)
MCHC: 32.7 g/dL (ref 30.0–36.0)
MCV: 80.3 fL (ref 80.0–100.0)
Monocytes Absolute: 1.3 10*3/uL — ABNORMAL HIGH (ref 0.1–1.0)
Monocytes Relative: 9 %
Neutro Abs: 12.1 10*3/uL — ABNORMAL HIGH (ref 1.7–7.7)
Neutrophils Relative %: 84 %
Platelets: 254 10*3/uL (ref 150–400)
RBC: 4.72 MIL/uL (ref 4.22–5.81)
RDW: 13.7 % (ref 11.5–15.5)
WBC: 14.5 10*3/uL — ABNORMAL HIGH (ref 4.0–10.5)
nRBC: 0 % (ref 0.0–0.2)

## 2018-08-20 LAB — GLUCOSE, CAPILLARY
GLUCOSE-CAPILLARY: 109 mg/dL — AB (ref 70–99)
GLUCOSE-CAPILLARY: 117 mg/dL — AB (ref 70–99)
GLUCOSE-CAPILLARY: 178 mg/dL — AB (ref 70–99)
GLUCOSE-CAPILLARY: 228 mg/dL — AB (ref 70–99)
Glucose-Capillary: 114 mg/dL — ABNORMAL HIGH (ref 70–99)
Glucose-Capillary: 130 mg/dL — ABNORMAL HIGH (ref 70–99)
Glucose-Capillary: 139 mg/dL — ABNORMAL HIGH (ref 70–99)
Glucose-Capillary: 149 mg/dL — ABNORMAL HIGH (ref 70–99)
Glucose-Capillary: 150 mg/dL — ABNORMAL HIGH (ref 70–99)
Glucose-Capillary: 156 mg/dL — ABNORMAL HIGH (ref 70–99)
Glucose-Capillary: 164 mg/dL — ABNORMAL HIGH (ref 70–99)
Glucose-Capillary: 170 mg/dL — ABNORMAL HIGH (ref 70–99)
Glucose-Capillary: 178 mg/dL — ABNORMAL HIGH (ref 70–99)
Glucose-Capillary: 191 mg/dL — ABNORMAL HIGH (ref 70–99)
Glucose-Capillary: 192 mg/dL — ABNORMAL HIGH (ref 70–99)
Glucose-Capillary: 196 mg/dL — ABNORMAL HIGH (ref 70–99)
Glucose-Capillary: 200 mg/dL — ABNORMAL HIGH (ref 70–99)
Glucose-Capillary: 202 mg/dL — ABNORMAL HIGH (ref 70–99)
Glucose-Capillary: 205 mg/dL — ABNORMAL HIGH (ref 70–99)
Glucose-Capillary: 219 mg/dL — ABNORMAL HIGH (ref 70–99)
Glucose-Capillary: 223 mg/dL — ABNORMAL HIGH (ref 70–99)

## 2018-08-20 LAB — POCT I-STAT EG7
Acid-base deficit: 5 mmol/L — ABNORMAL HIGH (ref 0.0–2.0)
Bicarbonate: 17.8 mmol/L — ABNORMAL LOW (ref 20.0–28.0)
Calcium, Ion: 1.07 mmol/L — ABNORMAL LOW (ref 1.15–1.40)
HCT: 45 % (ref 39.0–52.0)
Hemoglobin: 15.3 g/dL (ref 13.0–17.0)
O2 Saturation: 71 %
Potassium: 3.3 mmol/L — ABNORMAL LOW (ref 3.5–5.1)
Sodium: 134 mmol/L — ABNORMAL LOW (ref 135–145)
TCO2: 19 mmol/L — ABNORMAL LOW (ref 22–32)
pCO2, Ven: 27.9 mmHg — ABNORMAL LOW (ref 44.0–60.0)
pH, Ven: 7.414 (ref 7.250–7.430)
pO2, Ven: 36 mmHg (ref 32.0–45.0)

## 2018-08-20 LAB — MRSA PCR SCREENING: MRSA by PCR: POSITIVE — AB

## 2018-08-20 LAB — ETHANOL: Alcohol, Ethyl (B): <10 mg/dL (ref <10)

## 2018-08-20 MED ORDER — SODIUM CHLORIDE 0.9 % IV SOLN: INTRAVENOUS | Status: DC

## 2018-08-20 MED ORDER — METOCLOPRAMIDE HCL 5 MG/ML IJ SOLN
10.0000 mg | Freq: Three times a day (TID) | INTRAMUSCULAR | Status: DC
  Administered 2018-08-20: 10 mg via INTRAVENOUS
  Filled 2018-08-20: qty 2

## 2018-08-20 MED ORDER — ACETAMINOPHEN 325 MG PO TABS: 650.0000 mg | ORAL_TABLET | Freq: Four times a day (QID) | ORAL | Status: DC | PRN

## 2018-08-20 MED ORDER — KETOROLAC TROMETHAMINE 30 MG/ML IJ SOLN
30.0000 mg | Freq: Once | INTRAMUSCULAR | Status: AC
  Administered 2018-08-20: 30 mg via INTRAVENOUS
  Filled 2018-08-20: qty 1

## 2018-08-20 MED ORDER — ADULT MULTIVITAMIN W/MINERALS CH
1.0000 | ORAL_TABLET | Freq: Every day | ORAL | Status: DC
  Administered 2018-08-20 – 2018-08-21 (×2): 1 via ORAL
  Filled 2018-08-20 (×2): qty 1

## 2018-08-20 MED ORDER — BOOST / RESOURCE BREEZE PO LIQD CUSTOM
1.0000 | Freq: Three times a day (TID) | ORAL | Status: DC
  Administered 2018-08-20 – 2018-08-21 (×4): 1 via ORAL

## 2018-08-20 MED ORDER — METOCLOPRAMIDE HCL 5 MG/ML IJ SOLN
10.0000 mg | Freq: Once | INTRAMUSCULAR | Status: AC
  Administered 2018-08-20: 10 mg via INTRAVENOUS
  Filled 2018-08-20: qty 2

## 2018-08-20 MED ORDER — MORPHINE SULFATE (PF) 4 MG/ML IV SOLN
4.0000 mg | INTRAVENOUS | Status: DC | PRN
  Administered 2018-08-20: 4 mg via INTRAVENOUS
  Filled 2018-08-20: qty 1

## 2018-08-20 MED ORDER — PANTOPRAZOLE SODIUM 40 MG IV SOLR
40.0000 mg | Freq: Two times a day (BID) | INTRAVENOUS | Status: DC
  Administered 2018-08-20 – 2018-08-21 (×3): 40 mg via INTRAVENOUS
  Filled 2018-08-20 (×3): qty 40

## 2018-08-20 MED ORDER — CHLORHEXIDINE GLUCONATE CLOTH 2 % EX PADS
6.0000 | MEDICATED_PAD | Freq: Every day | CUTANEOUS | Status: DC
  Administered 2018-08-20 – 2018-08-21 (×2): 6 via TOPICAL

## 2018-08-20 MED ORDER — HYDROCODONE-ACETAMINOPHEN 5-325 MG PO TABS
1.0000 | ORAL_TABLET | ORAL | Status: DC | PRN
  Administered 2018-08-20: 1 via ORAL
  Administered 2018-08-21 (×2): 2 via ORAL
  Filled 2018-08-20 (×3): qty 2

## 2018-08-20 MED ORDER — POTASSIUM CHLORIDE 10 MEQ/100ML IV SOLN
10.0000 meq | INTRAVENOUS | Status: AC
  Administered 2018-08-20 (×2): 10 meq via INTRAVENOUS
  Filled 2018-08-20 (×2): qty 100

## 2018-08-20 MED ORDER — HYDRALAZINE HCL 20 MG/ML IJ SOLN: 10.0000 mg | INTRAMUSCULAR | Status: DC | PRN

## 2018-08-20 MED ORDER — SODIUM CHLORIDE 0.9% FLUSH
3.0000 mL | Freq: Two times a day (BID) | INTRAVENOUS | Status: DC
  Administered 2018-08-20 – 2018-08-21 (×4): 3 mL via INTRAVENOUS

## 2018-08-20 MED ORDER — ENOXAPARIN SODIUM 40 MG/0.4ML ~~LOC~~ SOLN
40.0000 mg | SUBCUTANEOUS | Status: DC
  Administered 2018-08-20: 40 mg via SUBCUTANEOUS
  Filled 2018-08-20: qty 0.4

## 2018-08-20 MED ORDER — METOCLOPRAMIDE HCL 5 MG/ML IJ SOLN
10.0000 mg | Freq: Three times a day (TID) | INTRAMUSCULAR | Status: DC
  Administered 2018-08-20 – 2018-08-21 (×4): 10 mg via INTRAVENOUS
  Filled 2018-08-20 (×4): qty 2

## 2018-08-20 MED ORDER — DEXTROSE-NACL 5-0.45 % IV SOLN
INTRAVENOUS | Status: DC
  Administered 2018-08-20 – 2018-08-21 (×4): via INTRAVENOUS

## 2018-08-20 MED ORDER — MUPIROCIN 2 % EX OINT
1.0000 "application " | TOPICAL_OINTMENT | Freq: Two times a day (BID) | CUTANEOUS | Status: DC
  Administered 2018-08-20 – 2018-08-21 (×3): 1 via NASAL
  Filled 2018-08-20: qty 22

## 2018-08-20 MED ORDER — ACETAMINOPHEN 650 MG RE SUPP: 650.0000 mg | Freq: Four times a day (QID) | RECTAL | Status: DC | PRN

## 2018-08-20 MED ORDER — DIPHENHYDRAMINE HCL 50 MG/ML IJ SOLN
25.0000 mg | Freq: Once | INTRAMUSCULAR | Status: AC
  Administered 2018-08-20: 25 mg via INTRAVENOUS
  Filled 2018-08-20: qty 1

## 2018-08-20 NOTE — Progress Notes (Signed)
Initial Nutrition Assessment  DOCUMENTATION CODES:   Non-severe (moderate) malnutrition in context of chronic illness  INTERVENTION:   - Boost Breeze po TID, each supplement provides 250 kcal and 9 grams of protein  - MVI with minerals daily  NUTRITION DIAGNOSIS:   Moderate Malnutrition related to chronic illness (poorly controlled T2DM with multiple episodes of DKA) as evidenced by mild fat depletion, moderate fat depletion, mild muscle depletion, moderate muscle depletion, severe muscle depletion.  GOAL:   Patient will meet greater than or equal to 90% of their needs  MONITOR:   Labs, PO intake, Supplement acceptance, Diet advancement, Weight trends  REASON FOR ASSESSMENT:   Malnutrition Screening Tool    ASSESSMENT:   53 year old male who presented to the ED on 2/26 with c/o vomiting. PMH significant for T2DM, CHF, HTN, GERD, cocaine abuse. Pt admitted with intractable N/V and upper GI bleeding.  Noted pt with multiple admissions for DKA.  Spoke with pt at length at bedside. Initially pt sleepy and lethargic but woke up more throughout visit and became talkative. At times throughout conversation, pt tearful.  Pt reports that his appetite is good and that he has not noticed any changes in his appetite. Pt shares that he eats "at least 3 meals" daily at home. Pt occasionally drinks oral nutrition supplements but does not do so regularly due to cost.  Breakfast: sausage, eggs, sometimes toast Lunch: [place] food like sesame chicken (no rice or noodles) Dinner: prepared by mother and may include BBQ chicken  Pt reports that he "stays away from" bread, potatoes, rice, noodles. RD provided education regarding the importance of carbohydrates and encouraged pt to focus on consistent carbohydrate intake rather than elimination of carbohydrates.  Pt shares that he checks his sugars 4-5 times daily and rarely has lows. Pt reports his sugar is occasionally high after lunch but  that it typically runs between 150-180. Pt endorses taking his medications as prescribed.  Pt states that he was initially diagnosed with DM in 2006 after losing a significant amount of weight, developing blindness, and developing some neuropathy. Pt reports that his weight prior to DM was 225 lbs and that he lost weight down to 151 lbs over 6 months back in 2006-2007.  Pt shares that when he gets ill (happens a few times a year), his weight drops. Pt reports that when he is well, he maintains his weight at 170 lbs and that he has never been able to gain above that.  Pt shares that he is tolerating his clear liquid breakfast (coffee, apple juice, Italian ice) well and denies N/V.  Pt very willing to try Boost Breeze oral nutrition supplement while on CLD. RD to order.  Per weight history in chart, pt with 4.7 kg weight loss since 05/18/18. This is a 6.9% weight loss in 3 months which is not quite significant for timeframe.  Medications reviewed and include: IV Reglan 10 mg TID, Protonix, insulin drip @ 3.5 ml/hr, KCl 10 mEq x 2 runs IVF: D5 @ 100 ml/hr  Labs reviewed: sodium 133 (L), potassium 3.4 (L) CBG's: 150, 191, 223, 164, 139, 156, 192, 228 x 12 hours  NUTRITION - FOCUSED PHYSICAL EXAM:    Most Recent Value  Orbital Region  Mild depletion  Upper Arm Region  Moderate depletion  Thoracic and Lumbar Region  Moderate depletion  Buccal Region  Mild depletion  Temple Region  Mild depletion  Clavicle Bone Region  Moderate depletion  Clavicle and Acromion Bone Region  Moderate  depletion  Scapular Bone Region  Moderate depletion  Dorsal Hand  Mild depletion  Patellar Region  Moderate depletion  Anterior Thigh Region  Severe depletion  Posterior Calf Region  Moderate depletion  Edema (RD Assessment)  None  Hair  Reviewed  Eyes  Reviewed  Mouth  Reviewed [edentulous]  Skin  Reviewed  Nails  Reviewed       Diet Order:   Diet Order            Diet clear liquid Room service  appropriate? Yes; Fluid consistency: Thin  Diet effective now              EDUCATION NEEDS:   Education needs have been addressed  Skin:  Skin Assessment: Reviewed RN Assessment  Last BM:  2/26  Height:   Ht Readings from Last 1 Encounters:  08/20/18 6' (1.829 m)    Weight:   Wt Readings from Last 1 Encounters:  08/20/18 63.9 kg    Ideal Body Weight:  80.9 kg  BMI:  Body mass index is 19.11 kg/m.  Estimated Nutritional Needs:   Kcal:  2000-2200  Protein:  100-115 grams  Fluid:  >/= 2.0 L    Earma Reading, MS, RD, LDN Inpatient Clinical Dietitian Pager: [phone] Weekend/After Hours: [phone]

## 2018-08-20 NOTE — Progress Notes (Signed)
PROGRESS NOTE        PATIENT DETAILS Name: Rickey Smith Age: 53 y.o. Sex: male Date of Birth: [birth year] Admit Date: 08/19/2018 Admitting Physician Briscoe Deutscher, MD ZOX:[id number], Binnie Rail, MD  Brief Narrative: Patient is a 53 y.o. male with prior history of insulin-dependent diabetes, probable history of gastroparesis, cocaine use-presented with intractable nausea and vomiting.  See below for further details  Subjective: Vomited last night-but none this morning.  No abdominal pain.  Assessment/Plan: Intractable nausea with vomiting and abdominal pain: High suspicion for gastroparesis, no acute abnormality seen on CT abdomen/pelvis.  Continue scheduled IV Reglan-start clear liquids and advance as tolerated.    Insulin-dependent DM with hyperglycemia: Since patient has intractable nausea and vomiting-continue IV insulin infusion until diet able to advance.  Once diet stable-we will transition to subcutaneous insulin.  A1c November 2019 was 10.7.    Esophageal wall thickening: Continue PPI-needs EGD at some point-but probably could be done in the outpatient setting as he does not have any symptoms of dysphagia.  Hypertension: Moderate controlled-resume amlodipine.  DVT Prophylaxis: Prophylactic Lovenox  Code Status: Full code   Family Communication: None at bedside  Disposition Plan: Remain inpatient-Home when no longer vomiting-likely in the next 1-2 days.  Antimicrobial agents: Anti-infectives (From admission, onward)   None      Procedures: None  CONSULTS:  None  Time spent: 25- minutes-Greater than 50% of this time was spent in counseling, explanation of diagnosis, planning of further management, and coordination of care.  MEDICATIONS: Scheduled Meds: . Chlorhexidine Gluconate Cloth  6 each Topical Q0600  . feeding supplement  1 Container Oral TID BM  . metoCLOPramide (REGLAN) injection  10 mg Intravenous TID AC  . multivitamin  with minerals  1 tablet Oral Daily  . mupirocin ointment  1 application Nasal BID  . pantoprazole (PROTONIX) IV  40 mg Intravenous Q12H  . sodium chloride flush  3 mL Intravenous Once  . sodium chloride flush  3 mL Intravenous Q12H   Continuous Infusions: . sodium chloride    . dextrose 5 % and 0.45% NaCl 100 mL/hr at 08/20/18 1129  . insulin 3.3 Units/hr (08/20/18 1128)   PRN Meds:.acetaminophen **OR** acetaminophen, hydrALAZINE, HYDROcodone-acetaminophen, morphine injection, ondansetron (ZOFRAN) IV, promethazine   PHYSICAL EXAM: Vital signs: Vitals:   08/20/18 0800 08/20/18 1000 08/20/18 1200 08/20/18 1250  BP: 135/86     Pulse: 97 91 93   Resp: (!) 21 (!) 24 (!) 24   Temp: 98.1 F (36.7 C)   98.9 F (37.2 C)  TempSrc: Oral   Oral  SpO2: 100% 99% 100%   Weight:      Height:       Filed Weights   08/20/18 0200 08/20/18 0548  Weight: 62.9 kg 63.9 kg   Body mass index is 19.11 kg/m.   General appearance :Awake, alert, not in any distress.  Eyes:Pink conjunctiva HEENT: Atraumatic and Normocephalic Neck: supple, no JVD. No cervical lymphadenopathy. No thyromegaly Resp:Good air entry bilaterally, no added sounds  CVS: S1 S2 regular, no murmurs.  GI: Bowel sounds present, Non tender and not distended with no gaurding, rigidity or rebound.No organomegaly Extremities: B/L Lower Ext shows no edema, both legs are warm to touch Neurology:  speech clear,Non focal, sensation is grossly intact. Musculoskeletal:No digital cyanosis Skin:No Rash, warm and dry Wounds:N/A  I have personally  reviewed following labs and imaging studies  LABORATORY DATA: CBC: Recent Labs  Lab 08/19/18 1218 08/20/18 0023 08/20/18 0601  WBC 11.6*  --  14.5*  NEUTROABS  --   --  12.1*  HGB 14.2 15.3 12.4*  HCT 41.8 45.0 37.9*  MCV 79.0*  --  80.3  PLT 325  --  254    Basic Metabolic Panel: Recent Labs  Lab 08/19/18 1218 08/19/18 2355 08/20/18 0023 08/20/18 0243 08/20/18 0601  08/20/18 1016  NA 135 134* 134* 136 133* 131*  K 3.6 3.2* 3.3* 3.1* 3.4* 3.4*  CL 96* 101  --  105 100 103  CO2 21* 17*  --  21* 16* 19*  GLUCOSE 214* 258*  --  149* 248* 197*  BUN 10 11  --  11 11 9   CREATININE 0.88 0.98  --  0.84 0.92 0.72  CALCIUM 9.4 7.7*  --  8.0* 7.9* 7.6*    GFR: Estimated Creatinine Clearance: 98.7 mL/min (by C-G formula based on SCr of 0.72 mg/dL).  Liver Function Tests: Recent Labs  Lab 08/19/18 1218  AST 16  ALT 15  ALKPHOS 69  BILITOT 1.0  PROT 7.2  ALBUMIN 3.7   Recent Labs  Lab 08/19/18 1218  LIPASE 29   No results for input(s): AMMONIA in the last 168 hours.  Coagulation Profile: No results for input(s): INR, PROTIME in the last 168 hours.  Cardiac Enzymes: No results for input(s): CKTOTAL, CKMB, CKMBINDEX, TROPONINI in the last 168 hours.  BNP (last 3 results) No results for input(s): PROBNP in the last 8760 hours.  HbA1C: No results for input(s): HGBA1C in the last 72 hours.  CBG: Recent Labs  Lab 08/20/18 0653 08/20/18 0738 08/20/18 0921 08/20/18 1020 08/20/18 1212  GLUCAP 191* 150* 117* 178* 149*    Lipid Profile: No results for input(s): CHOL, HDL, LDLCALC, TRIG, CHOLHDL, LDLDIRECT in the last 72 hours.  Thyroid Function Tests: No results for input(s): TSH, T4TOTAL, FREET4, T3FREE, THYROIDAB in the last 72 hours.  Anemia Panel: No results for input(s): VITAMINB12, FOLATE, FERRITIN, TIBC, IRON, RETICCTPCT in the last 72 hours.  Urine analysis:    Component Value Date/Time   COLORURINE YELLOW 08/19/2018 2050   APPEARANCEUR CLEAR 08/19/2018 2050   LABSPEC 1.030 08/19/2018 2050   PHURINE 8.0 08/19/2018 2050   GLUCOSEU >=500 (A) 08/19/2018 2050   HGBUR NEGATIVE 08/19/2018 2050   BILIRUBINUR NEGATIVE 08/19/2018 2050   BILIRUBINUR neg 08/10/2015 1041   KETONESUR 80 (A) 08/19/2018 2050   PROTEINUR 30 (A) 08/19/2018 2050   UROBILINOGEN 2.0 08/10/2015 1041   UROBILINOGEN 0.2 10/23/2014 0817   NITRITE NEGATIVE  08/19/2018 2050   LEUKOCYTESUR NEGATIVE 08/19/2018 2050    Sepsis Labs: Lactic Acid, Venous    Component Value Date/Time   LATICACIDVEN 1.8 08/18/2017 0735    MICROBIOLOGY: Recent Results (from the past 240 hour(s))  MRSA PCR Screening     Status: Abnormal   Collection Time: 08/20/18  2:27 AM  Result Value Ref Range Status   MRSA by PCR POSITIVE (A) NEGATIVE Final    Comment:        The GeneXpert MRSA Assay (FDA approved for NASAL specimens only), is one component of a comprehensive MRSA colonization surveillance program. It is not intended to diagnose MRSA infection nor to guide or monitor treatment for MRSA infections. RESULT CALLED TO, READ BACK BY AND VERIFIED WITH: LIGHTNER,N RN 08/20/2018 AT 0340 SKEEN,P     RADIOLOGY STUDIES/RESULTS: Ct Abdomen Pelvis W Contrast  Result Date: 08/19/2018 CLINICAL DATA:  Abdominal pain and vomiting. EXAM: CT ABDOMEN AND PELVIS WITH CONTRAST TECHNIQUE: Multidetector CT imaging of the abdomen and pelvis was performed using the standard protocol following bolus administration of intravenous contrast. CONTRAST:  OMNIPAQUE IOHEXOL 300 MG/ML  SOLN COMPARISON:  04/29/2018 FINDINGS: Lower chest: Lung bases are clear. Diffuse wall thickening of the distal esophagus as seen previously. This could represent reflux disease or esophageal neoplasm. Hepatobiliary: Subcentimeter low-attenuation lesions in the dome of the liver likely representing cysts or hemangiomas. No change. Gallbladder and bile ducts are unremarkable. Pancreas: Unremarkable. No pancreatic ductal dilatation or surrounding inflammatory changes. Spleen: Normal in size without focal abnormality. Adrenals/Urinary Tract: No adrenal gland nodules. Renal nephrograms are symmetrical. Scarring in the lower pole of the right kidney focally. Small cyst on the upper pole left kidney. No hydronephrosis or hydroureter. Bladder is unremarkable. Stomach/Bowel: Stomach, small bowel, and colon are  not abnormally distended. No wall thickening or inflammatory changes are appreciated. Appendix is normal. Vascular/Lymphatic: Aortic atherosclerosis. No enlarged abdominal or pelvic lymph nodes. Reproductive: Prostate is unremarkable. Other: No abdominal wall hernia or abnormality. No abdominopelvic ascites. Musculoskeletal: Degenerative changes in the spine. No destructive bone lesions. IMPRESSION: 1. No acute process demonstrated in the abdomen or pelvis. No evidence of bowel obstruction or inflammation. 2. Diffuse wall thickening of the distal esophagus as seen previously. This could represent reflux disease or esophageal neoplasm. Electronically Signed   By: Burman Nieves M.D.   On: 08/19/2018 23:56   Dg Chest Portable 1 View  Result Date: 08/19/2018 CLINICAL DATA:  Vomiting since last night. Abdominal pain. Diabetes. EXAM: PORTABLE CHEST 1 VIEW COMPARISON:  05/18/2018 FINDINGS: Normal heart size and pulmonary vascularity. Diffuse emphysematous changes in the lungs. No airspace disease or consolidation. No blunting of costophrenic angles. No pneumothorax. Mediastinal contours appear intact. Old fracture deformity of the left clavicle. IMPRESSION: Emphysematous changes in the lungs. No evidence of active pulmonary disease. Electronically Signed   By: Burman Nieves M.D.   On: 08/19/2018 23:36     LOS: 0 days   Jeoffrey Massed, MD  [facility]  If 7PM-7AM, please contact night-coverage  Please page via [email or web address]  Go to [email or web address] and use 's universal password to access. If you do not have the password, please contact the hospital operator.  Locate the [facility] provider you are looking for under [facility] and page to a number that you can be directly reached. If you still have difficulty reaching the provider, please page the [facility] (Director on Call) for the Hospitalists listed on amion for assistance.  08/20/2018, 1:09 PM

## 2018-08-20 NOTE — H&P (Signed)
History and Physical    [name]:[id number] DOB: 12-11-1965 DOA: 08/19/2018  PCP: Marcine Matar, MD   Patient coming from: Home   Chief Complaint: Abdominal pain, N/V   HPI: Rickey Smith is a 53 y.o. male with medical history significant for insulin-dependent diabetes mellitus, hypertension, GERD, and cocaine abuse, now presenting to the emergency department for evaluation of severe abdominal pain and nausea with recurrent vomiting.  Patient reports that he woke early yesterday morning with severe pain in his upper abdomen.  He was also experiencing severe nausea at that time and has had recurrent episodes of vomiting.  Pain is been constant, localized to the upper abdomen, severe in intensity, cramping in character, and associated with nausea and vomiting.  He denies diarrhea, fevers, chest pain, shortness of breath, rhinorrhea, sore throat, or cough.  He denies any illicit substance use.  ED Course: Upon arrival to the ED, patient is found to be afebrile, saturating well on room air, tachycardic in the 110s, and with blood pressure 190/100.  EKG features sinus tachycardia with rate 118 and PVC.  Chest x-ray is notable for emphysematous changes without acute findings.  CT abdomen and pelvis reveals persistent diffuse wall thickening of the distal esophagus, but no acute findings.  Chemistry panel is notable for glucose of 214 with bicarbonate of 21 and anion gap of 18.  LFTs and lipase are normal.  CBC features a mild leukocytosis.  Troponin is normal.  UA notable for glucosuria, ketonuria, and mild proteinuria.  UDS is positive for opiates and cocaine.  The patient was given 2 L of normal saline, 10 mg IV Reglan, Zofran, and was started on insulin infusion in the ED.  He continues to have severe abdominal pain and ongoing nausea with more than and will be observed for further evaluation and management.  Review of Systems:  All other systems reviewed and apart from HPI, are  negative.  Past Medical History:  Diagnosis Date  . CHF (congestive heart failure) (HCC)    "think I have this before" (11/11/2017)  . Diabetic peripheral neuropathy (HCC)   . Migraine    "once q month or 2" (11/11/2017)  . Type II diabetes mellitus (HCC)     Past Surgical History:  Procedure Laterality Date  . TRANSURETHRAL RESECTION OF PROSTATE N/A 08/16/2015   Procedure: TRANSURETHRAL RESECTION DRAINAGE OF PROSTATE ABCESS;  Surgeon: Barron Alvine, MD;  Location: [facility];  Service: Urology;  Laterality: N/A;     reports that he quit smoking about 17 months ago. His smoking use included cigarettes. He has a 17.00 pack-year smoking history. He has never used smokeless tobacco. He reports previous alcohol use. He reports current drug use. Drugs: Cocaine and Heroin.  Allergies  Allergen Reactions  . Gabapentin Diarrhea and Nausea And Vomiting    Had to be hospitalized because of the reaction(s)  . Codeine Itching  . Penicillins Hives and Rash    Has patient had a PCN reaction causing immediate rash, facial/tongue/throat swelling, SOB or lightheadedness with hypotension: Yes Has patient had a PCN reaction causing severe rash involving mucus membranes or skin necrosis: Unk Has patient had a PCN reaction that required hospitalization: Unk Has patient had a PCN reaction occurring within the last 10 years: Yes If all of the above answers are "NO", then may proceed with Cephalosporin use.     Family History  Problem Relation Age of Onset  . Heart attack Unknown   . Prostate cancer Unknown   .  Hypertension Unknown   . Hypertension Mother   . Cancer Father      Prior to Admission medications   Medication Sig Start Date End Date Taking? Authorizing Provider  insulin aspart protamine- aspart (NOVOLOG MIX 70/30) (70-30) 100 UNIT/ML injection Inject 0.4-0.45 mLs (40-45 Units total) into the skin See admin instructions. Injects 45 units subcutaneously daily before breakfast  and 40 units at  bedtime 05/19/18  Yes Vann, Jessica U, DO  amitriptyline (ELAVIL) 75 MG tablet Take 1 tablet (75 mg total) by mouth at bedtime. 04/16/17   Vivianne Master, PA-C  amLODipine (NORVASC) 5 MG tablet Take 1 tablet (5 mg total) by mouth daily. Patient not taking: Reported on 08/19/2018 05/01/18   Catarina Hartshorn, MD  pantoprazole (PROTONIX) 40 MG tablet Take 1 tablet (40 mg total) by mouth 2 (two) times daily before a meal. Patient not taking: Reported on 08/19/2018 05/01/18   Catarina Hartshorn, MD  sucralfate (CARAFATE) 1 GM/10ML suspension Take 10 mLs (1 g total) by mouth 4 (four) times daily -  with meals and at bedtime. Patient not taking: Reported on 08/19/2018 05/01/18   Catarina Hartshorn, MD    Physical Exam: Vitals:   08/19/18 2145 08/19/18 2230 08/19/18 2245 08/19/18 2315  BP: (!) 180/101 (!) 175/92 (!) 164/90 (!) 187/88  Pulse: (!) 107 100 (!) 103 (!) 106  Resp: (!) 27 (!) 25 (!) 37 (!) 32  Temp:      TempSrc:      SpO2: 100% 100% 100% 100%    Constitutional: NAD, in obvious discomfort, actively vomiting  Eyes: PERTLA, lids and conjunctivae normal ENMT: Mucous membranes are moist. Posterior pharynx clear of any exudate or lesions.   Neck: normal, supple, no masses, no thyromegaly Respiratory: clear to auscultation bilaterally, no wheezing, no crackles. Normal respiratory effort.    Cardiovascular: rate ~120 and regular. No extremity edema.   Abdomen: No distension, soft, tender in epigastrium. Bowel sounds acitve.  Musculoskeletal: no clubbing / cyanosis. No joint deformity upper and lower extremities.   Skin: no significant rashes, lesions, ulcers. Poor turgor. Neurologic: No gross facial asymmetry. Sensation intact. Moving all extremities.  Psychiatric:  Alert and oriented x 3. Calm, cooperative.    Labs on Admission: I have personally reviewed following labs and imaging studies  CBC: Recent Labs  Lab 08/19/18 1218 08/20/18 0023  WBC 11.6*  --   HGB 14.2 15.3  HCT 41.8 45.0  MCV 79.0*  --     PLT 325  --    Basic Metabolic Panel: Recent Labs  Lab 08/19/18 1218 08/19/18 2355 08/20/18 0023  NA 135 134* 134*  K 3.6 3.2* 3.3*  CL 96* 101  --   CO2 21* 17*  --   GLUCOSE 214* 258*  --   BUN 10 11  --   CREATININE 0.88 0.98  --   CALCIUM 9.4 7.7*  --    GFR: CrCl cannot be calculated (Unknown ideal weight.). Liver Function Tests: Recent Labs  Lab 08/19/18 1218  AST 16  ALT 15  ALKPHOS 69  BILITOT 1.0  PROT 7.2  ALBUMIN 3.7   Recent Labs  Lab 08/19/18 1218  LIPASE 29   No results for input(s): AMMONIA in the last 168 hours. Coagulation Profile: No results for input(s): INR, PROTIME in the last 168 hours. Cardiac Enzymes: No results for input(s): CKTOTAL, CKMB, CKMBINDEX, TROPONINI in the last 168 hours. BNP (last 3 results) No results for input(s): PROBNP in the last 8760 hours. HbA1C:  No results for input(s): HGBA1C in the last 72 hours. CBG: Recent Labs  Lab 08/19/18 2143 08/19/18 2259  GLUCAP 363* 259*   Lipid Profile: No results for input(s): CHOL, HDL, LDLCALC, TRIG, CHOLHDL, LDLDIRECT in the last 72 hours. Thyroid Function Tests: No results for input(s): TSH, T4TOTAL, FREET4, T3FREE, THYROIDAB in the last 72 hours. Anemia Panel: No results for input(s): VITAMINB12, FOLATE, FERRITIN, TIBC, IRON, RETICCTPCT in the last 72 hours. Urine analysis:    Component Value Date/Time   COLORURINE YELLOW 08/19/2018 2050   APPEARANCEUR CLEAR 08/19/2018 2050   LABSPEC 1.030 08/19/2018 2050   PHURINE 8.0 08/19/2018 2050   GLUCOSEU >=500 (A) 08/19/2018 2050   HGBUR NEGATIVE 08/19/2018 2050   BILIRUBINUR NEGATIVE 08/19/2018 2050   BILIRUBINUR neg 08/10/2015 1041   KETONESUR 80 (A) 08/19/2018 2050   PROTEINUR 30 (A) 08/19/2018 2050   UROBILINOGEN 2.0 08/10/2015 1041   UROBILINOGEN 0.2 10/23/2014 0817   NITRITE NEGATIVE 08/19/2018 2050   LEUKOCYTESUR NEGATIVE 08/19/2018 2050   Sepsis Labs: @LABRCNTIP (procalcitonin:4,lacticidven:4) )No results found  for this or any previous visit (from the past 240 hour(s)).   Radiological Exams on Admission: Ct Abdomen Pelvis W Contrast  Result Date: 08/19/2018 CLINICAL DATA:  Abdominal pain and vomiting. EXAM: CT ABDOMEN AND PELVIS WITH CONTRAST TECHNIQUE: Multidetector CT imaging of the abdomen and pelvis was performed using the standard protocol following bolus administration of intravenous contrast. CONTRAST:  OMNIPAQUE IOHEXOL 300 MG/ML  SOLN COMPARISON:  04/29/2018 FINDINGS: Lower chest: Lung bases are clear. Diffuse wall thickening of the distal esophagus as seen previously. This could represent reflux disease or esophageal neoplasm. Hepatobiliary: Subcentimeter low-attenuation lesions in the dome of the liver likely representing cysts or hemangiomas. No change. Gallbladder and bile ducts are unremarkable. Pancreas: Unremarkable. No pancreatic ductal dilatation or surrounding inflammatory changes. Spleen: Normal in size without focal abnormality. Adrenals/Urinary Tract: No adrenal gland nodules. Renal nephrograms are symmetrical. Scarring in the lower pole of the right kidney focally. Small cyst on the upper pole left kidney. No hydronephrosis or hydroureter. Bladder is unremarkable. Stomach/Bowel: Stomach, small bowel, and colon are not abnormally distended. No wall thickening or inflammatory changes are appreciated. Appendix is normal. Vascular/Lymphatic: Aortic atherosclerosis. No enlarged abdominal or pelvic lymph nodes. Reproductive: Prostate is unremarkable. Other: No abdominal wall hernia or abnormality. No abdominopelvic ascites. Musculoskeletal: Degenerative changes in the spine. No destructive bone lesions. IMPRESSION: 1. No acute process demonstrated in the abdomen or pelvis. No evidence of bowel obstruction or inflammation. 2. Diffuse wall thickening of the distal esophagus as seen previously. This could represent reflux disease or esophageal neoplasm. Electronically Signed   By: Burman Nieves  M.D.   On: 08/19/2018 23:56   Dg Chest Portable 1 View  Result Date: 08/19/2018 CLINICAL DATA:  Vomiting since last night. Abdominal pain. Diabetes. EXAM: PORTABLE CHEST 1 VIEW COMPARISON:  05/18/2018 FINDINGS: Normal heart size and pulmonary vascularity. Diffuse emphysematous changes in the lungs. No airspace disease or consolidation. No blunting of costophrenic angles. No pneumothorax. Mediastinal contours appear intact. Old fracture deformity of the left clavicle. IMPRESSION: Emphysematous changes in the lungs. No evidence of active pulmonary disease. Electronically Signed   By: Burman Nieves M.D.   On: 08/19/2018 23:36    EKG: Independently reviewed. Sinus tachycardia (rate 118), PVC.   Assessment/Plan   1. Abdominal pain; intractable N/V; upper GI bleeding  - Presents with severe abdominal pain and N/V  - Vomitus is maroon liquid and positive for occult blood; H&H normal  - There  is epigastric tenderness on exam  - LFT's and lipase are normal  - CT abd/pelvis with persistent esophageal wall-thickening, but no acute findings  - Gastroparesis suspected  - Continue IVF hydration, start IV PPI, schedule Reglan and continue as-needed Zofran, phenergan for refractory N/V; GI had been consulted during a similar recent admission and were considering EGD once he is no longer cocaine-positive   2. Uncontrolled insulin-dependent DM  - He has slight metabolic acidosis and ketonuria secondary to starvation/N/V vs mild DKA  - Started on fluid-resuscitation and insulin infusion in ED  - Continue IVF hydration, continue insulin infusion with frequent CBG's and serial chem panels   3. Hypertension  - BP 190/100 in ED with pain and N/V likely contributing  - He is unable to tolerate oral medications on admission  - Continue pain-control and antiemetics, and use hydralazine IVP's as needed for now    4. Substance abuse  - UDS positive for cocaine and opiates  - Patient denies use     DVT  prophylaxis: SCD's  Code Status: Full  Family Communication: Discussed with patient  Consults called: none Admission status: Observation     Briscoe Deutscher, MD [facility] Pager [phone]  If 7PM-7AM, please contact night-coverage [email or web address] Password [email or web address]  08/20/2018, 1:05 AM

## 2018-08-20 NOTE — Progress Notes (Addendum)
Inpatient Diabetes Program Recommendations  AACE/ADA: New Consensus Statement on Inpatient Glycemic Control (2015)  Target Ranges:  Prepandial:   less than 140 mg/dL      Peak postprandial:   less than 180 mg/dL (1-2 hours)      Critically ill patients:  140 - 180 mg/dL   Lab Results  Component Value Date   GLUCAP 149 (H) 08/20/2018   HGBA1C 10.7 (H) 04/29/2018    Review of Glycemic Control   Diabetes history: DM Outpatient Diabetes medications: Novolog mix 70/30 insulin 45 units am and 45 units pm (clarified with patient as home med in computer listed as 45 units am/40 units pm) Current orders for Inpatient glycemic control: Insulin drip   Saw patient today and noted he is eating CL diet. Discussed with nurse how to carb count his CL diet (patient consumed [place] ice/apple juice/boost breeze recently). Showed RN on glucostabilizer how to enter carbs for insulin bolus (to cover his intake).   Discussed home diabetes management with patient. He was diagnosed in 2006 and is "insulin dependent" per patient. He doesn't have insurance and buys his Novolog mix 70/30 over the counter at [employer]. States he doesn't have any difficulty obtaining insulin and that he also has a working glucometer. States he is able to purchase lancets/strips and checks his blood glucose 3-4x/day. Patient [facility] to [facility] (last note dated 05/18/18) from Dr. Jonah Blue.   Discussed patient's labs with bedside RN and he will call next BMET to MD.   -- Will follow during hospitalization.--  Jamelle Rushing RN, MSN Diabetes Coordinator Inpatient Glycemic Control Team Team Pager: [phone] (8am-5pm)

## 2018-08-20 NOTE — Progress Notes (Signed)
IV tubing from Insulin gtt found disconnected from patient's IV. Patient states that he does not know what happened. Will continue to monitor.

## 2018-08-20 NOTE — Progress Notes (Signed)
Bl[place], NP notified of BG 205, CO2 23, and Anion Gap 7. Insulin IV gtt remains. Awaiting response. Will continue to monitor.

## 2018-08-21 LAB — BASIC METABOLIC PANEL
Anion gap: 10 (ref 5–15)
Anion gap: 9 (ref 5–15)
BUN: 5 mg/dL — ABNORMAL LOW (ref 6–20)
BUN: 6 mg/dL (ref 6–20)
CALCIUM: 8.7 mg/dL — AB (ref 8.9–10.3)
CO2: 21 mmol/L — ABNORMAL LOW (ref 22–32)
CO2: 22 mmol/L (ref 22–32)
Calcium: 8.4 mg/dL — ABNORMAL LOW (ref 8.9–10.3)
Chloride: 103 mmol/L (ref 98–111)
Chloride: 104 mmol/L (ref 98–111)
Creatinine, Ser: 0.75 mg/dL (ref 0.61–1.24)
Creatinine, Ser: 0.66 mg/dL (ref 0.61–1.24)
GFR calc Af Amer: >60 mL/min (ref >60)
GFR calc Af Amer: >60 mL/min (ref >60)
GFR calc non Af Amer: >60 mL/min (ref >60)
GFR calc non Af Amer: >60 mL/min (ref >60)
Glucose, Bld: 150 mg/dL — ABNORMAL HIGH (ref 70–99)
Glucose, Bld: 86 mg/dL (ref 70–99)
POTASSIUM: 3.2 mmol/L — AB (ref 3.5–5.1)
Potassium: 2.8 mmol/L — ABNORMAL LOW (ref 3.5–5.1)
Sodium: 134 mmol/L — ABNORMAL LOW (ref 135–145)
Sodium: 135 mmol/L (ref 135–145)

## 2018-08-21 LAB — GLUCOSE, CAPILLARY
GLUCOSE-CAPILLARY: 168 mg/dL — AB (ref 70–99)
GLUCOSE-CAPILLARY: 138 mg/dL — AB (ref 70–99)
Glucose-Capillary: 104 mg/dL — ABNORMAL HIGH (ref 70–99)
Glucose-Capillary: 112 mg/dL — ABNORMAL HIGH (ref 70–99)
Glucose-Capillary: 122 mg/dL — ABNORMAL HIGH (ref 70–99)
Glucose-Capillary: 147 mg/dL — ABNORMAL HIGH (ref 70–99)
Glucose-Capillary: 155 mg/dL — ABNORMAL HIGH (ref 70–99)
Glucose-Capillary: 370 mg/dL — ABNORMAL HIGH (ref 70–99)

## 2018-08-21 MED ORDER — POTASSIUM CHLORIDE 20 MEQ PO PACK
40.0000 meq | PACK | Freq: Once | ORAL | Status: AC
  Administered 2018-08-21: 40 meq via ORAL
  Filled 2018-08-21: qty 2

## 2018-08-21 MED ORDER — METOCLOPRAMIDE HCL 10 MG PO TABS: 10.0000 mg | ORAL_TABLET | Freq: Three times a day (TID) | ORAL | 0 refills | Status: AC | PRN

## 2018-08-21 MED ORDER — INSULIN GLARGINE 100 UNIT/ML ~~LOC~~ SOLN
25.0000 [IU] | SUBCUTANEOUS | Status: AC
  Administered 2018-08-21: 25 [IU] via SUBCUTANEOUS
  Filled 2018-08-21: qty 0.25

## 2018-08-21 MED ORDER — AMLODIPINE BESYLATE 5 MG PO TABS: 5.0000 mg | ORAL_TABLET | Freq: Every day | ORAL | 0 refills | Status: AC

## 2018-08-21 MED ORDER — POTASSIUM CHLORIDE 10 MEQ/100ML IV SOLN
10.0000 meq | INTRAVENOUS | Status: DC
  Administered 2018-08-21: 10 meq via INTRAVENOUS
  Filled 2018-08-21: qty 100

## 2018-08-21 MED ORDER — INSULIN ASPART 100 UNIT/ML ~~LOC~~ SOLN
0.0000 [IU] | Freq: Three times a day (TID) | SUBCUTANEOUS | Status: DC
  Administered 2018-08-21: 9 [IU] via SUBCUTANEOUS

## 2018-08-21 MED ORDER — PANTOPRAZOLE SODIUM 40 MG PO TBEC: 40.0000 mg | DELAYED_RELEASE_TABLET | Freq: Every day | ORAL | 0 refills | Status: AC

## 2018-08-21 NOTE — Progress Notes (Signed)
Feels much better-no further nausea vomiting-tolerating advancement in diet.  Stop IV insulin-transition to subcutaneous insulin.  If he does well-home later today.

## 2018-08-21 NOTE — Progress Notes (Addendum)
Went back into room to remove IV and patient had already been taken out via WC by NT. Patients phone number called to return to remove IV. Message left on voicemail. Awaiting return call. MD and [facility] notified

## 2018-08-21 NOTE — Discharge Summary (Signed)
PATIENT DETAILS Name: Rickey Smith Age: 53 y.o. Sex: male Date of Birth: [birth year] MRN: [id number]. Admitting Physician: Briscoe Deutscher, MD [id number], Binnie Rail, MD  Admit Date: 08/19/2018 Discharge date: 08/21/2018  Recommendations for Outpatient Follow-up:  1. Follow up with PCP in 1-2 weeks 2. Please obtain BMP/CBC in one week 3. Please continue counseling regarding importance of abstaining from further cocaine use 4. Continue counseling regarding importance of compliance to medications and tight glycemic control 5. Continue small portion meals-if vomiting continues-consider further work-up for gastroparesis/GI evaluation in the outpatient setting 6. Has esophageal thickening and prior CT scans of the abdomen-consider outpatient EGD.  Admitted From:  Home  Disposition: Home   Home Health: No  Equipment/Devices: None  Discharge Condition: Stable  CODE STATUS: FULL CODE  Diet recommendation:  Heart Healthy / Carb Modified   Brief Summary: See H&P, Labs, Consult and Test reports for all details in brief, Patient is a 53 y.o. male with prior history of insulin-dependent diabetes, probable history of gastroparesis, cocaine use-presented with intractable nausea and vomiting.  See below for further details  Brief Hospital Course: Intractable nausea with vomiting and abdominal pain: High suspicion for gastroparesis, no acute abnormality seen on CT abdomen/pelvis.    Markedly better with IV Reglan-tolerating advancement in diet.  Stable for discharge later today if he continues to tolerate further advancement in diet.  Counseled regarding importance of small portion meals.  PCP to consider further work-up in the outpatient setting.  He probably requires a EGD-see below.  Insulin-dependent DM with hyperglycemia: Since patient has intractable nausea and vomiting-he was managed with IV insulin-since he is tolerating advancement in diet-this was discontinued on 2/28 early  morning.  He will be resumed back on his usual insulin regimen and discharged home.  Further optimization of his insulin regimen will be done by his primary care practitioner.  A1C November 2019 was 10.7.    Esophageal wall thickening: Continue PPI-needs EGD at some point-but probably could be done in the outpatient setting as he does not have any symptoms of dysphagia.  Hypertension: Moderate controlled-resume amlodipine.  Procedures/Studies: None  Discharge Diagnoses:  Principal Problem:   Abdominal pain with vomiting Active Problems:   Uncontrolled type 2 diabetes mellitus with hyperglycemia, with long-term current use of insulin (HCC)   Cocaine use   Abnormal CT scan, esophagus   Upper GI bleeding   Essential hypertension   Intractable vomiting with nausea   Discharge Instructions:  Activity:  As tolerated   Discharge Instructions    Diet Carb Modified   Complete by:  As directed    Discharge instructions   Complete by:  As directed    Follow with Primary MD  Marcine Matar, MD in 1 week  Stop cocaine use  Take your insulin as directed  Check CBGs before meals and at bedtime-keep a record of these readings and take it to your primary care practitioners office at your next visit  You have esophageal thickening (food pipe) on CT scan-this was present in the past as well, please ask your primary care practitioner to refer you to a gastroenterologist for endoscopy.  In some cases, these thickening can be cancerous or precancerous.  Please get a complete blood count and chemistry panel checked by your Primary MD at your next visit, and again as instructed by your Primary MD.  Get Medicines reviewed and adjusted: Please take all your medications with you for your next visit with your Primary MD  Laboratory/radiological  data: Please request your Primary MD to go over all hospital tests and procedure/radiological results at the follow up, please ask your Primary MD to  get all Hospital records sent to his/her office.  In some cases, they will be blood work, cultures and biopsy results pending at the time of your discharge. Please request that your primary care M.D. follows up on these results.  Also Note the following: If you experience worsening of your admission symptoms, develop shortness of breath, life threatening emergency, suicidal or homicidal thoughts you must seek medical attention immediately by calling 911 or calling your MD immediately  if symptoms less severe.  You must read complete instructions/literature along with all the possible adverse reactions/side effects for all the Medicines you take and that have been prescribed to you. Take any new Medicines after you have completely understood and accpet all the possible adverse reactions/side effects.   Do not drive when taking Pain medications or sleeping medications (Benzodaizepines)  Do not take more than prescribed Pain, Sleep and Anxiety Medications. It is not advisable to combine anxiety,sleep and pain medications without talking with your primary care practitioner  Special Instructions: If you have smoked or chewed Tobacco  in the last 2 yrs please stop smoking, stop any regular Alcohol  and or any Recreational drug use.  Wear Seat belts while driving.  Please note: You were cared for by a hospitalist during your hospital stay. Once you are discharged, your primary care physician will handle any further medical issues. Please note that NO REFILLS for any discharge medications will be authorized once you are discharged, as it is imperative that you return to your primary care physician (or establish a relationship with a primary care physician if you do not have one) for your post hospital discharge needs so that they can reassess your need for medications and monitor your lab values.   Increase activity slowly   Complete by:  As directed      Allergies as of 08/21/2018      Reactions    Gabapentin Diarrhea, Nausea And Vomiting   Had to be hospitalized because of the reaction(s)   Codeine Itching   Penicillins Hives, Rash   Has patient had a PCN reaction causing immediate rash, facial/tongue/throat swelling, SOB or lightheadedness with hypotension: Yes Has patient had a PCN reaction causing severe rash involving mucus membranes or skin necrosis: Unk Has patient had a PCN reaction that required hospitalization: Unk Has patient had a PCN reaction occurring within the last 10 years: Yes If all of the above answers are "NO", then may proceed with Cephalosporin use.      Medication List    STOP taking these medications   sucralfate 1 GM/10ML suspension Commonly known as:  CARAFATE     TAKE these medications   amitriptyline 75 MG tablet Commonly known as:  ELAVIL Take 1 tablet (75 mg total) by mouth at bedtime.   amLODipine 5 MG tablet Commonly known as:  NORVASC Take 1 tablet (5 mg total) by mouth daily.   insulin aspart protamine- aspart (70-30) 100 UNIT/ML injection Commonly known as:  NOVOLOG MIX 70/30 Inject 0.4-0.45 mLs (40-45 Units total) into the skin See admin instructions. Injects 45 units subcutaneously daily before breakfast  and 40 units at bedtime   metoCLOPramide 10 MG tablet Commonly known as:  REGLAN Take 1 tablet (10 mg total) by mouth every 8 (eight) hours as needed for nausea.   pantoprazole 40 MG tablet Commonly known as:  PROTONIX Take 1 tablet (40 mg total) by mouth daily. What changed:  when to take this      Follow-up Information    Marcine Matar, MD. Schedule an appointment as soon as possible for a visit in 1 week(s).   Specialty:  Internal Medicine Contact information: [address] [place] [address] [phone]          Allergies  Allergen Reactions  . Gabapentin Diarrhea and Nausea And Vomiting    Had to be hospitalized because of the reaction(s)  . Codeine Itching  . Penicillins Hives and Rash    Has  patient had a PCN reaction causing immediate rash, facial/tongue/throat swelling, SOB or lightheadedness with hypotension: Yes Has patient had a PCN reaction causing severe rash involving mucus membranes or skin necrosis: Unk Has patient had a PCN reaction that required hospitalization: Unk Has patient had a PCN reaction occurring within the last 10 years: Yes If all of the above answers are "NO", then may proceed with Cephalosporin use.     Consultations:   None   Other Procedures/Studies: Ct Abdomen Pelvis W Contrast  Result Date: 08/19/2018 CLINICAL DATA:  Abdominal pain and vomiting. EXAM: CT ABDOMEN AND PELVIS WITH CONTRAST TECHNIQUE: Multidetector CT imaging of the abdomen and pelvis was performed using the standard protocol following bolus administration of intravenous contrast. CONTRAST:  OMNIPAQUE IOHEXOL 300 MG/ML  SOLN COMPARISON:  04/29/2018 FINDINGS: Lower chest: Lung bases are clear. Diffuse wall thickening of the distal esophagus as seen previously. This could represent reflux disease or esophageal neoplasm. Hepatobiliary: Subcentimeter low-attenuation lesions in the dome of the liver likely representing cysts or hemangiomas. No change. Gallbladder and bile ducts are unremarkable. Pancreas: Unremarkable. No pancreatic ductal dilatation or surrounding inflammatory changes. Spleen: Normal in size without focal abnormality. Adrenals/Urinary Tract: No adrenal gland nodules. Renal nephrograms are symmetrical. Scarring in the lower pole of the right kidney focally. Small cyst on the upper pole left kidney. No hydronephrosis or hydroureter. Bladder is unremarkable. Stomach/Bowel: Stomach, small bowel, and colon are not abnormally distended. No wall thickening or inflammatory changes are appreciated. Appendix is normal. Vascular/Lymphatic: Aortic atherosclerosis. No enlarged abdominal or pelvic lymph nodes. Reproductive: Prostate is unremarkable. Other: No abdominal wall hernia or  abnormality. No abdominopelvic ascites. Musculoskeletal: Degenerative changes in the spine. No destructive bone lesions. IMPRESSION: 1. No acute process demonstrated in the abdomen or pelvis. No evidence of bowel obstruction or inflammation. 2. Diffuse wall thickening of the distal esophagus as seen previously. This could represent reflux disease or esophageal neoplasm. Electronically Signed   By: Burman Nieves M.D.   On: 08/19/2018 23:56   Dg Chest Portable 1 View  Result Date: 08/19/2018 CLINICAL DATA:  Vomiting since last night. Abdominal pain. Diabetes. EXAM: PORTABLE CHEST 1 VIEW COMPARISON:  05/18/2018 FINDINGS: Normal heart size and pulmonary vascularity. Diffuse emphysematous changes in the lungs. No airspace disease or consolidation. No blunting of costophrenic angles. No pneumothorax. Mediastinal contours appear intact. Old fracture deformity of the left clavicle. IMPRESSION: Emphysematous changes in the lungs. No evidence of active pulmonary disease. Electronically Signed   By: Burman Nieves M.D.   On: 08/19/2018 23:36     TODAY-DAY OF DISCHARGE:  Subjective:   Pasty Spillers today has no headache,no chest abdominal pain,no new weakness tingling or numbness, feels much better wants to go home today.   Objective:   Blood pressure (!) 158/100, pulse 97, temperature 98.3 F (36.8 C), temperature source Oral, resp. rate (!) 27, height 6' (  1.829 m), weight 63.3 kg, SpO2 98 %.  Intake/Output Summary (Last 24 hours) at 08/21/2018 1105 Last data filed at 08/21/2018 0706 Gross per 24 hour  Intake 1612.22 ml  Output 3250 ml  Net -1637.78 ml   Filed Weights   08/20/18 0548 08/21/18 0500 08/21/18 0628  Weight: 63.9 kg 65.2 kg 63.3 kg    Exam: Awake Alert, Oriented *3, No new F.N deficits, Normal affect [place].AT,PERRAL Supple Neck,No JVD, No cervical lymphadenopathy appriciated.  Symmetrical Chest wall movement, Good air movement bilaterally, CTAB RRR,No Gallops,Rubs or new  Murmurs, No Parasternal Heave +ve B.Sounds, Abd Soft, Non tender, No organomegaly appriciated, No rebound -guarding or rigidity. No Cyanosis, Clubbing or edema, No new Rash or bruise   PERTINENT RADIOLOGIC STUDIES: Ct Abdomen Pelvis W Contrast  Result Date: 08/19/2018 CLINICAL DATA:  Abdominal pain and vomiting. EXAM: CT ABDOMEN AND PELVIS WITH CONTRAST TECHNIQUE: Multidetector CT imaging of the abdomen and pelvis was performed using the standard protocol following bolus administration of intravenous contrast. CONTRAST:  OMNIPAQUE IOHEXOL 300 MG/ML  SOLN COMPARISON:  04/29/2018 FINDINGS: Lower chest: Lung bases are clear. Diffuse wall thickening of the distal esophagus as seen previously. This could represent reflux disease or esophageal neoplasm. Hepatobiliary: Subcentimeter low-attenuation lesions in the dome of the liver likely representing cysts or hemangiomas. No change. Gallbladder and bile ducts are unremarkable. Pancreas: Unremarkable. No pancreatic ductal dilatation or surrounding inflammatory changes. Spleen: Normal in size without focal abnormality. Adrenals/Urinary Tract: No adrenal gland nodules. Renal nephrograms are symmetrical. Scarring in the lower pole of the right kidney focally. Small cyst on the upper pole left kidney. No hydronephrosis or hydroureter. Bladder is unremarkable. Stomach/Bowel: Stomach, small bowel, and colon are not abnormally distended. No wall thickening or inflammatory changes are appreciated. Appendix is normal. Vascular/Lymphatic: Aortic atherosclerosis. No enlarged abdominal or pelvic lymph nodes. Reproductive: Prostate is unremarkable. Other: No abdominal wall hernia or abnormality. No abdominopelvic ascites. Musculoskeletal: Degenerative changes in the spine. No destructive bone lesions. IMPRESSION: 1. No acute process demonstrated in the abdomen or pelvis. No evidence of bowel obstruction or inflammation. 2. Diffuse wall thickening of the distal esophagus as  seen previously. This could represent reflux disease or esophageal neoplasm. Electronically Signed   By: Burman Nieves M.D.   On: 08/19/2018 23:56   Dg Chest Portable 1 View  Result Date: 08/19/2018 CLINICAL DATA:  Vomiting since last night. Abdominal pain. Diabetes. EXAM: PORTABLE CHEST 1 VIEW COMPARISON:  05/18/2018 FINDINGS: Normal heart size and pulmonary vascularity. Diffuse emphysematous changes in the lungs. No airspace disease or consolidation. No blunting of costophrenic angles. No pneumothorax. Mediastinal contours appear intact. Old fracture deformity of the left clavicle. IMPRESSION: Emphysematous changes in the lungs. No evidence of active pulmonary disease. Electronically Signed   By: Burman Nieves M.D.   On: 08/19/2018 23:36     PERTINENT LAB RESULTS: CBC: Recent Labs    08/19/18 1218 08/20/18 0023 08/20/18 0601  WBC 11.6*  --  14.5*  HGB 14.2 15.3 12.4*  HCT 41.8 45.0 37.9*  PLT 325  --  254   CMET CMP     Component Value Date/Time   NA 135 08/21/2018 0341   NA 136 06/26/2017 1101   K 3.2 (L) 08/21/2018 0341   CL 104 08/21/2018 0341   CO2 22 08/21/2018 0341   GLUCOSE 86 08/21/2018 0341   BUN 6 08/21/2018 0341   BUN 19 06/26/2017 1101   CREATININE 0.66 08/21/2018 0341   CALCIUM 8.4 (L) 08/21/2018 0341  PROT 7.2 08/19/2018 1218   ALBUMIN 3.7 08/19/2018 1218   AST 16 08/19/2018 1218   ALT 15 08/19/2018 1218   ALKPHOS 69 08/19/2018 1218   BILITOT 1.0 08/19/2018 1218   GFRNONAA >60 08/21/2018 0341   GFRAA >60 08/21/2018 0341    GFR Estimated Creatinine Clearance: 97.8 mL/min (by C-G formula based on SCr of 0.66 mg/dL). Recent Labs    08/19/18 1218  LIPASE 29   No results for input(s): CKTOTAL, CKMB, CKMBINDEX, TROPONINI in the last 72 hours. Invalid input(s): POCBNP No results for input(s): DDIMER in the last 72 hours. No results for input(s): HGBA1C in the last 72 hours. No results for input(s): CHOL, HDL, LDLCALC, TRIG, CHOLHDL, LDLDIRECT in  the last 72 hours. No results for input(s): TSH, T4TOTAL, T3FREE, THYROIDAB in the last 72 hours.  Invalid input(s): FREET3 No results for input(s): VITAMINB12, FOLATE, FERRITIN, TIBC, IRON, RETICCTPCT in the last 72 hours. Coags: No results for input(s): INR in the last 72 hours.  Invalid input(s): PT Microbiology: Recent Results (from the past 240 hour(s))  MRSA PCR Screening     Status: Abnormal   Collection Time: 08/20/18  2:27 AM  Result Value Ref Range Status   MRSA by PCR POSITIVE (A) NEGATIVE Final    Comment:        The GeneXpert MRSA Assay (FDA approved for NASAL specimens only), is one component of a comprehensive MRSA colonization surveillance program. It is not intended to diagnose MRSA infection nor to guide or monitor treatment for MRSA infections. RESULT CALLED TO, READ BACK BY AND VERIFIED WITH: LIGHTNER,N RN 08/20/2018 AT 0340 SKEEN,P     FURTHER DISCHARGE INSTRUCTIONS:  Get Medicines reviewed and adjusted: Please take all your medications with you for your next visit with your Primary MD  Laboratory/radiological data: Please request your Primary MD to go over all hospital tests and procedure/radiological results at the follow up, please ask your Primary MD to get all Hospital records sent to his/her office.  In some cases, they will be blood work, cultures and biopsy results pending at the time of your discharge. Please request that your primary care M.D. goes through all the records of your hospital data and follows up on these results.  Also Note the following: If you experience worsening of your admission symptoms, develop shortness of breath, life threatening emergency, suicidal or homicidal thoughts you must seek medical attention immediately by calling 911 or calling your MD immediately  if symptoms less severe.  You must read complete instructions/literature along with all the possible adverse reactions/side effects for all the Medicines you take  and that have been prescribed to you. Take any new Medicines after you have completely understood and accpet all the possible adverse reactions/side effects.   Do not drive when taking Pain medications or sleeping medications (Benzodaizepines)  Do not take more than prescribed Pain, Sleep and Anxiety Medications. It is not advisable to combine anxiety,sleep and pain medications without talking with your primary care practitioner  Special Instructions: If you have smoked or chewed Tobacco  in the last 2 yrs please stop smoking, stop any regular Alcohol  and or any Recreational drug use.  Wear Seat belts while driving.  Please note: You were cared for by a hospitalist during your hospital stay. Once you are discharged, your primary care physician will handle any further medical issues. Please note that NO REFILLS for any discharge medications will be authorized once you are discharged, as it is imperative that you  return to your primary care physician (or establish a relationship with a primary care physician if you do not have one) for your post hospital discharge needs so that they can reassess your need for medications and monitor your lab values.  Total Time spent coordinating discharge including counseling, education and face to face time equals 35 minutes.  SignedJeoffrey Massed 08/21/2018 11:05 AM

## 2018-08-21 NOTE — Progress Notes (Signed)
Blount,NP notified of BMP resulted. Will continue to monitor.

## 2018-08-21 NOTE — Progress Notes (Signed)
Patient to discharge. CBG 370, covered with SSI and MD aware. PAtient with no complaints at the current time.Will dc via WC.

## 2018-09-01 NOTE — Progress Notes (Deleted)
Patient ID: Rickey Smith, male   DOB: 02-04-1966, 53 y.o.   MRN: [id number]   After hospitalization 2/26-2/28/2020.  From discharge summary: Brief Hospital Course: Intractable nausea with vomiting and abdominal pain: High suspicion for gastroparesis, no acute abnormality seen on CT abdomen/pelvis.   Markedly better with IV Reglan-tolerating advancement in diet.  Stable for discharge later today if he continues to tolerate further advancement in diet.  Counseled regarding importance of small portion meals.  PCP to consider further work-up in the outpatient setting.  He probably requires a EGD-see below.  Insulin-dependent DM with hyperglycemia: Since patient has intractable nausea and vomiting-he was managed with IV insulin-since he is tolerating advancement in diet-this was discontinued on 2/28 early morning.  He will be resumed back on his usual insulin regimen and discharged home.  Further optimization of his insulin regimen will be done by his primary care practitioner. A1C November 2019 was 10.7.   Esophageal wall thickening:Continue PPI-needs EGD at some point-but probably could be done in the outpatient setting as he does not have any symptoms of dysphagia.  Hypertension:Moderate controlled-resume amlodipine.  Procedures/Studies: None  Discharge Diagnoses:  Principal Problem:   Abdominal pain with vomiting Active Problems:   Uncontrolled type 2 diabetes mellitus with hyperglycemia, with long-term current use of insulin (HCC)   Cocaine use   Abnormal CT scan, esophagus   Upper GI bleeding   Essential hypertension   Intractable vomiting with nausea

## 2018-09-02 ENCOUNTER — Inpatient Hospital Stay: Payer: Self-pay

## 2018-09-23 DEATH — deceased

## 2018-09-24 ENCOUNTER — Inpatient Hospital Stay: Payer: Self-pay | Admitting: Critical Care Medicine

## 2019-02-25 IMAGING — DX DG CHEST 1V PORT
1 series · 1 of 1 positions shown · non-contrast
Comparison: 12/27/2016

CLINICAL DATA: Vomiting, body aches, fever

EXAM:
PORTABLE CHEST 1 VIEW

[chest]
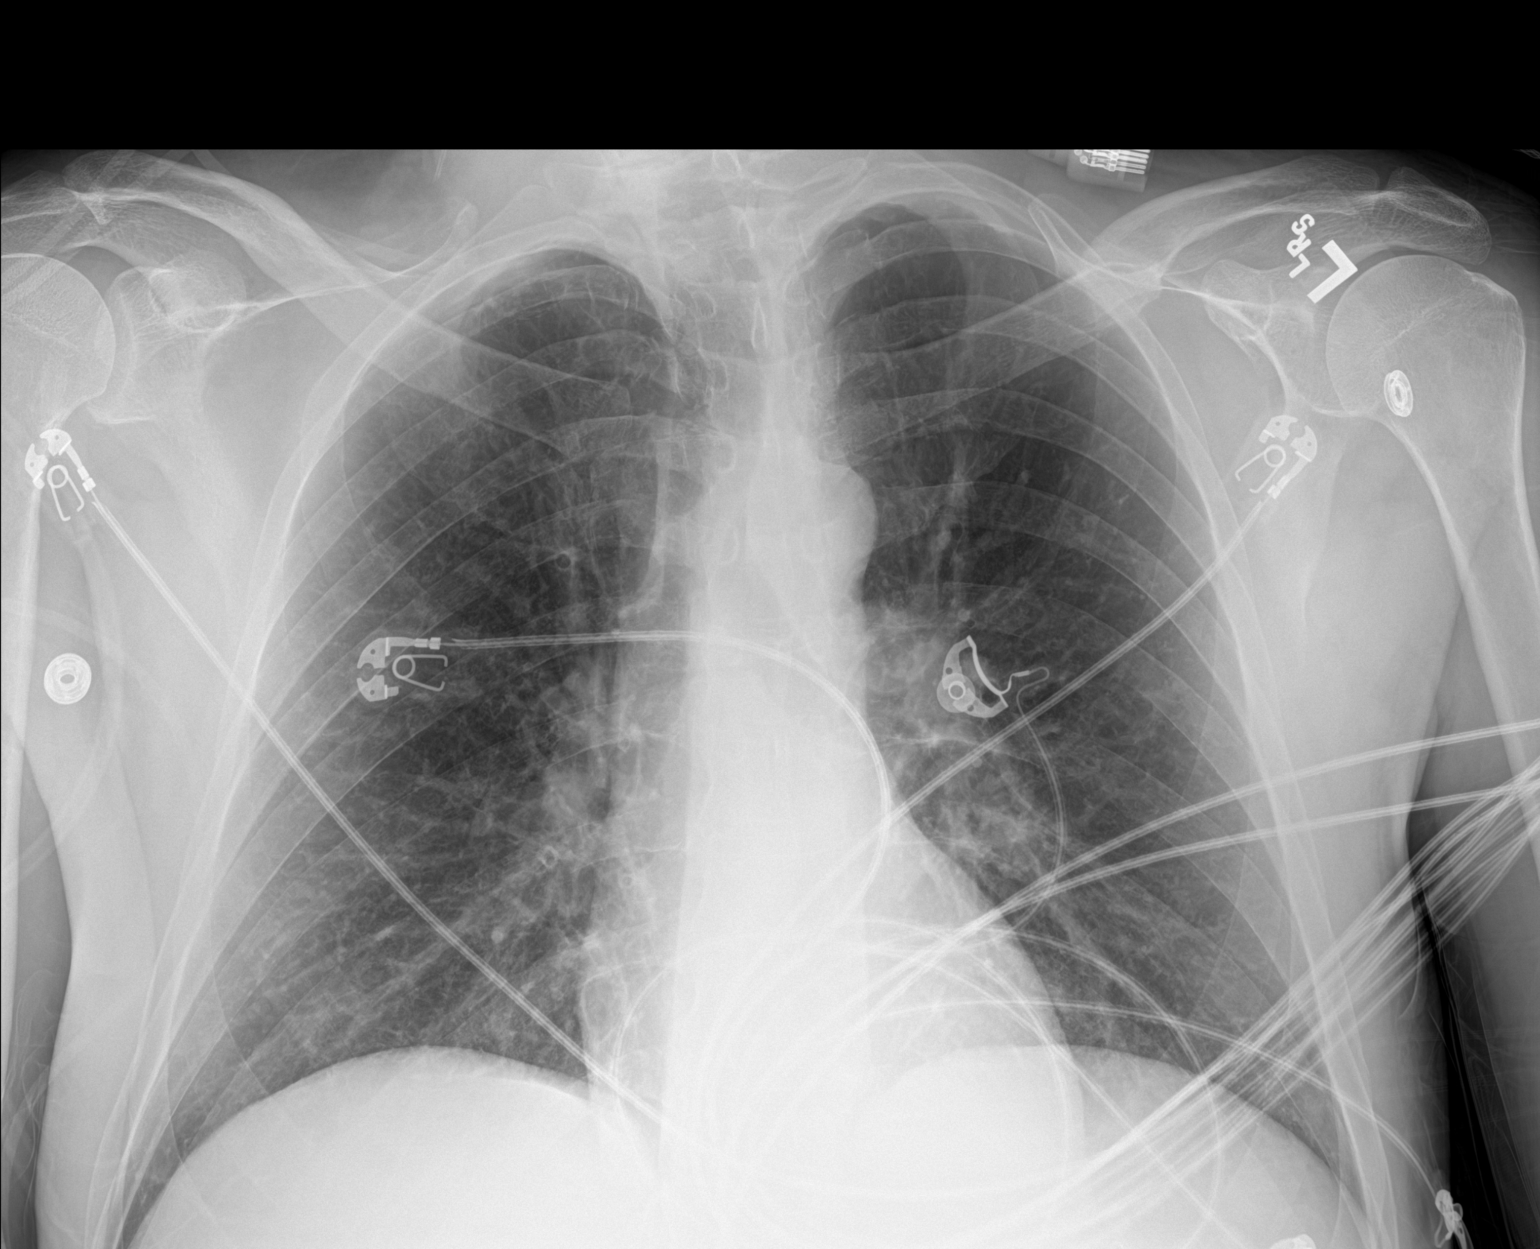

[1 of 1 positions shown; findings below may reference images not displayed]

FINDINGS: Mild peribronchial thickening. Heart and mediastinal contours are
within normal limits. No focal opacities or effusions. No acute bony
abnormality.
IMPRESSION: Mild bronchitic changes

## 2019-02-25 IMAGING — DX DG ABD PORTABLE 1V
1 series · 2 of 2 positions shown · non-contrast
Comparison: 04/23/2016

CLINICAL DATA: Vomiting, body aches, fever

EXAM:
PORTABLE ABDOMEN - 1 VIEW

[Series 1: abdomen · 0.14mm/px · 2 of 2 slices shown]
[im 1/2]
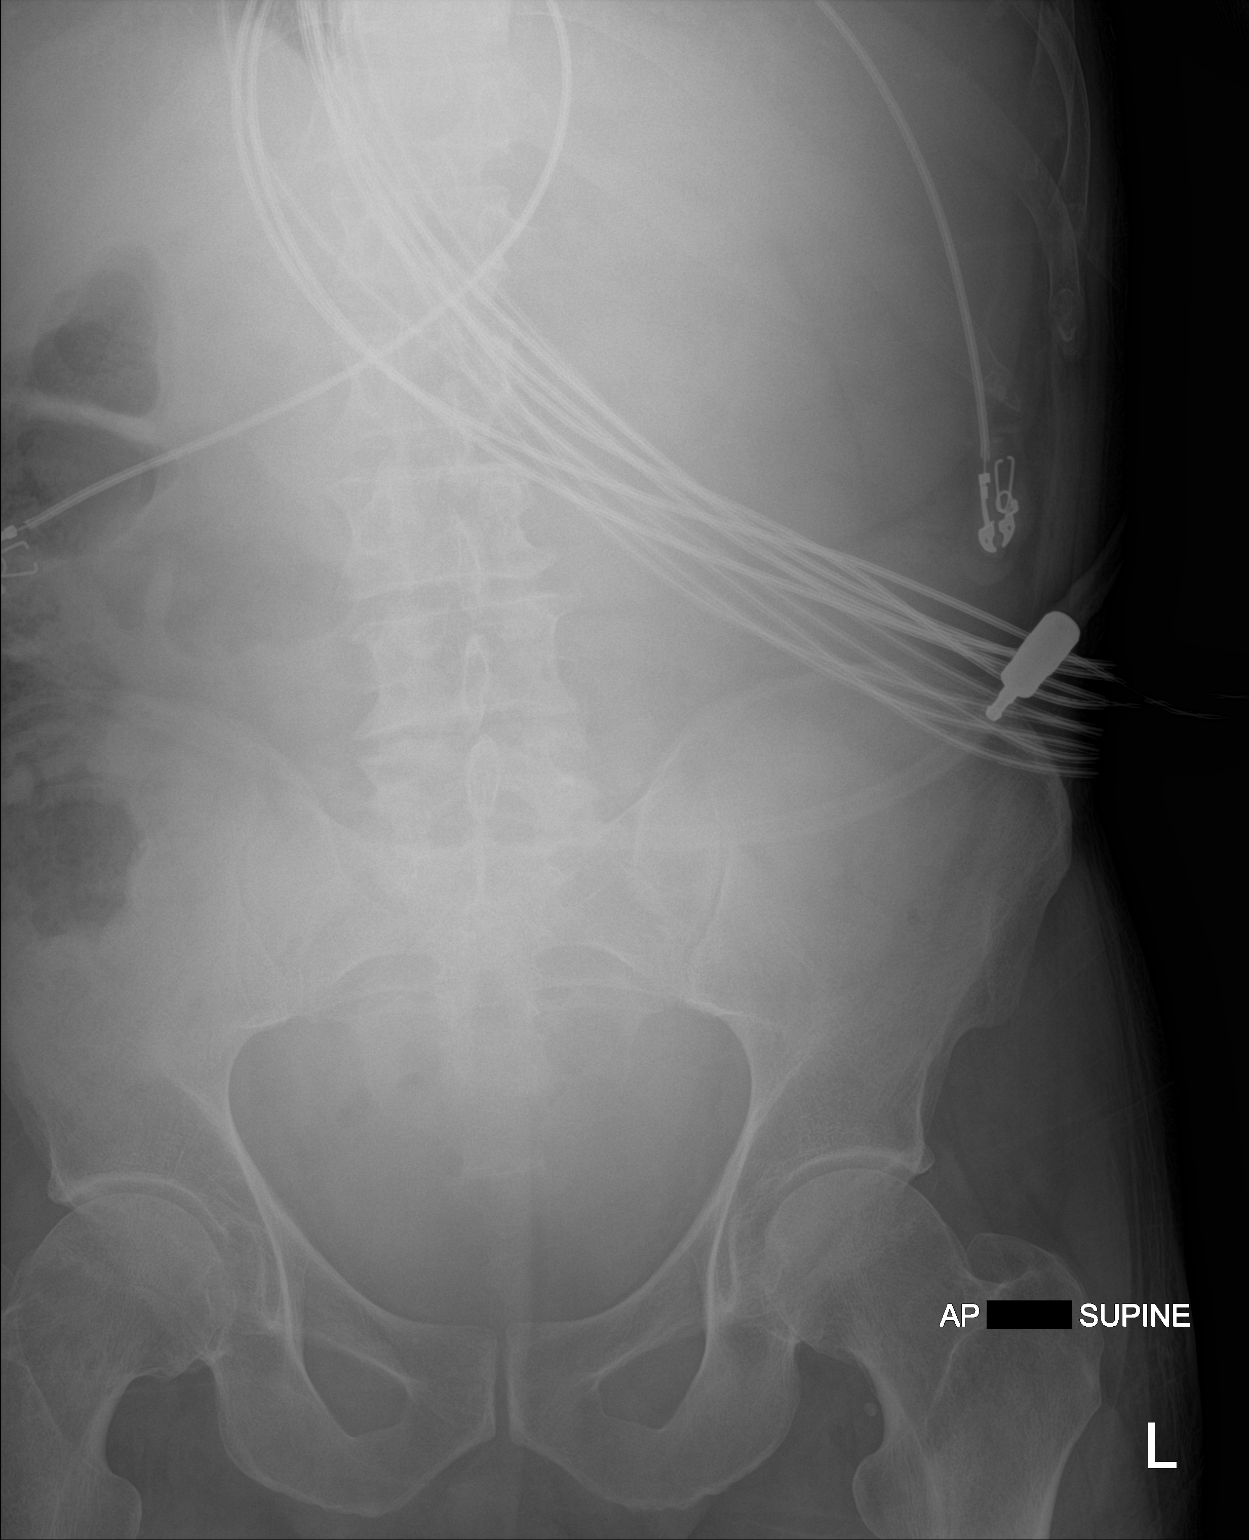
[im 2/2]
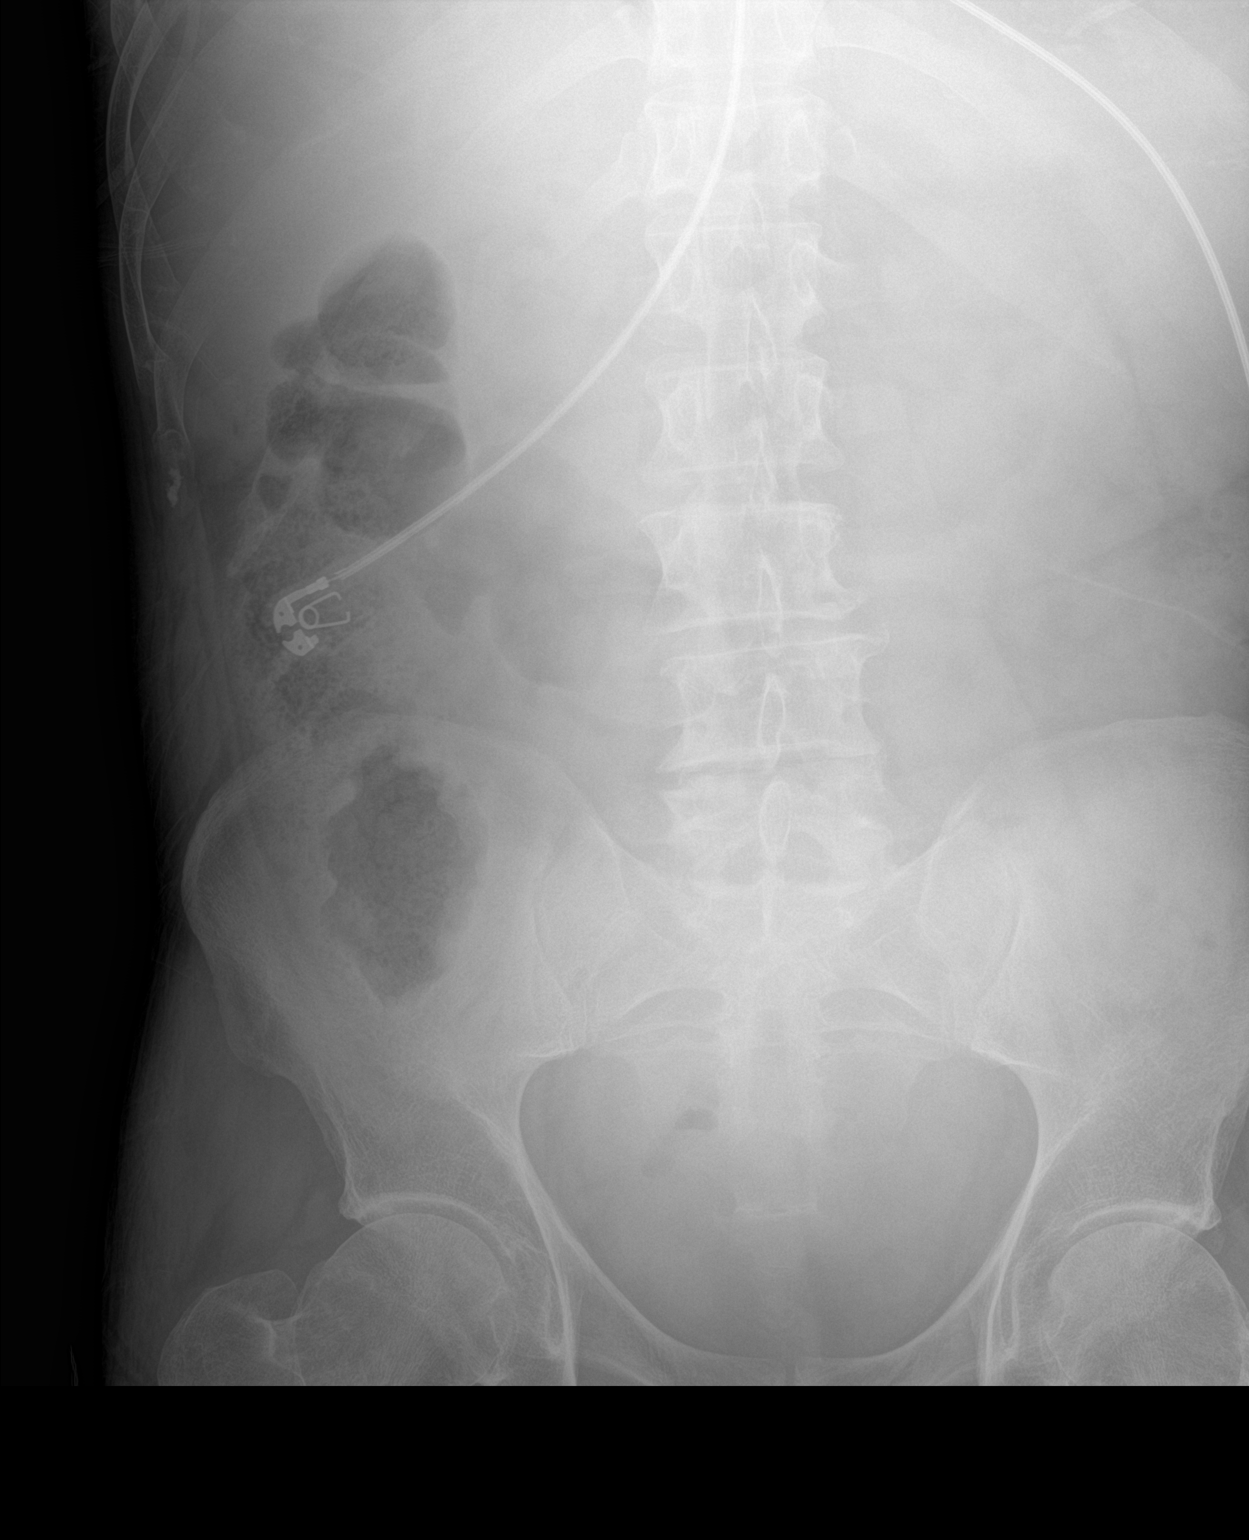

[2 of 2 positions shown; findings below may reference images not displayed]

FINDINGS: Nonspecific bowel gas pattern with relative paucity of gas
throughout the abdomen and pelvis. Gas only visualized within the
right colon and proximal transverse colon. No free air organomegaly.
No suspicious calcification.
IMPRESSION: Nonspecific, nonobstructive bowel gas pattern with relative paucity
of gas throughout the abdomen and pelvis as above.

## 2019-02-27 IMAGING — CR DG ABDOMEN 1V
2 series · 2 of 2 positions shown · non-contrast
Comparison: Abdomen films of 08/17/2017

CLINICAL DATA: Abdominal pain for 3 days, some vomiting

EXAM:
ABDOMEN - 1 VIEW

[abdomen kub (1 of 2)]
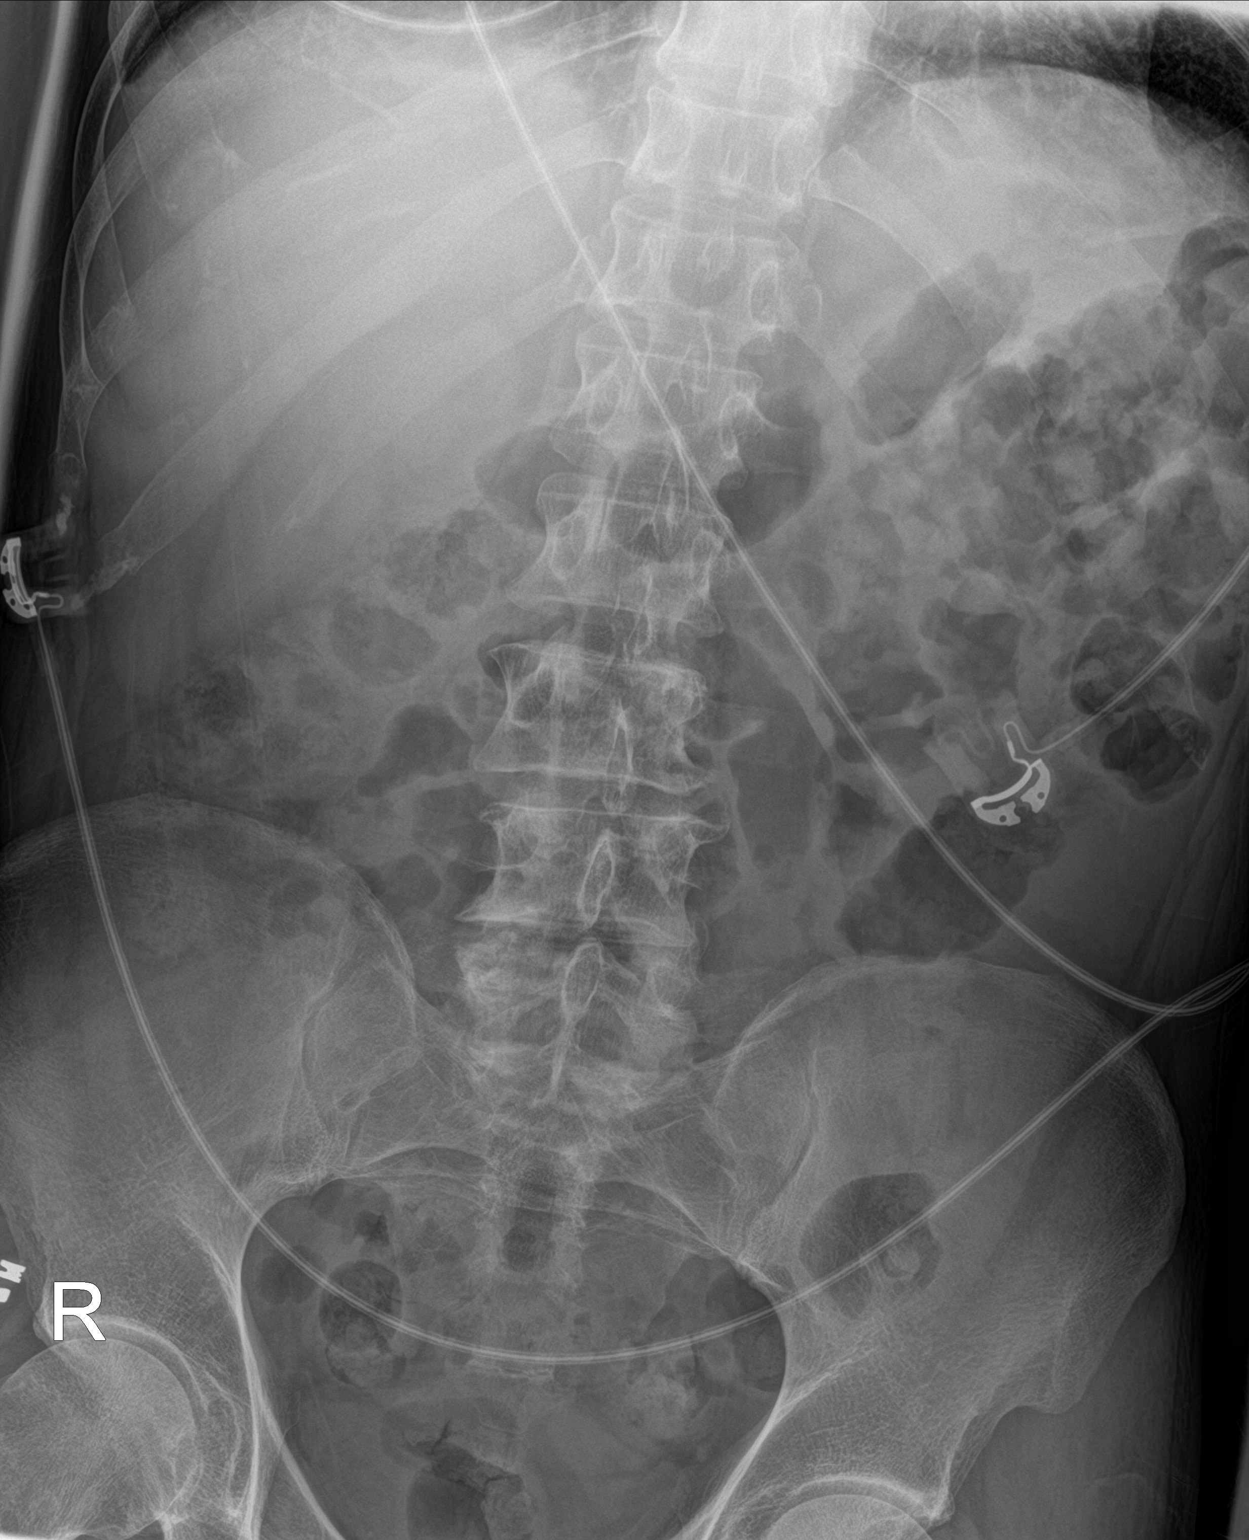

[abdomen kub (2 of 2)]
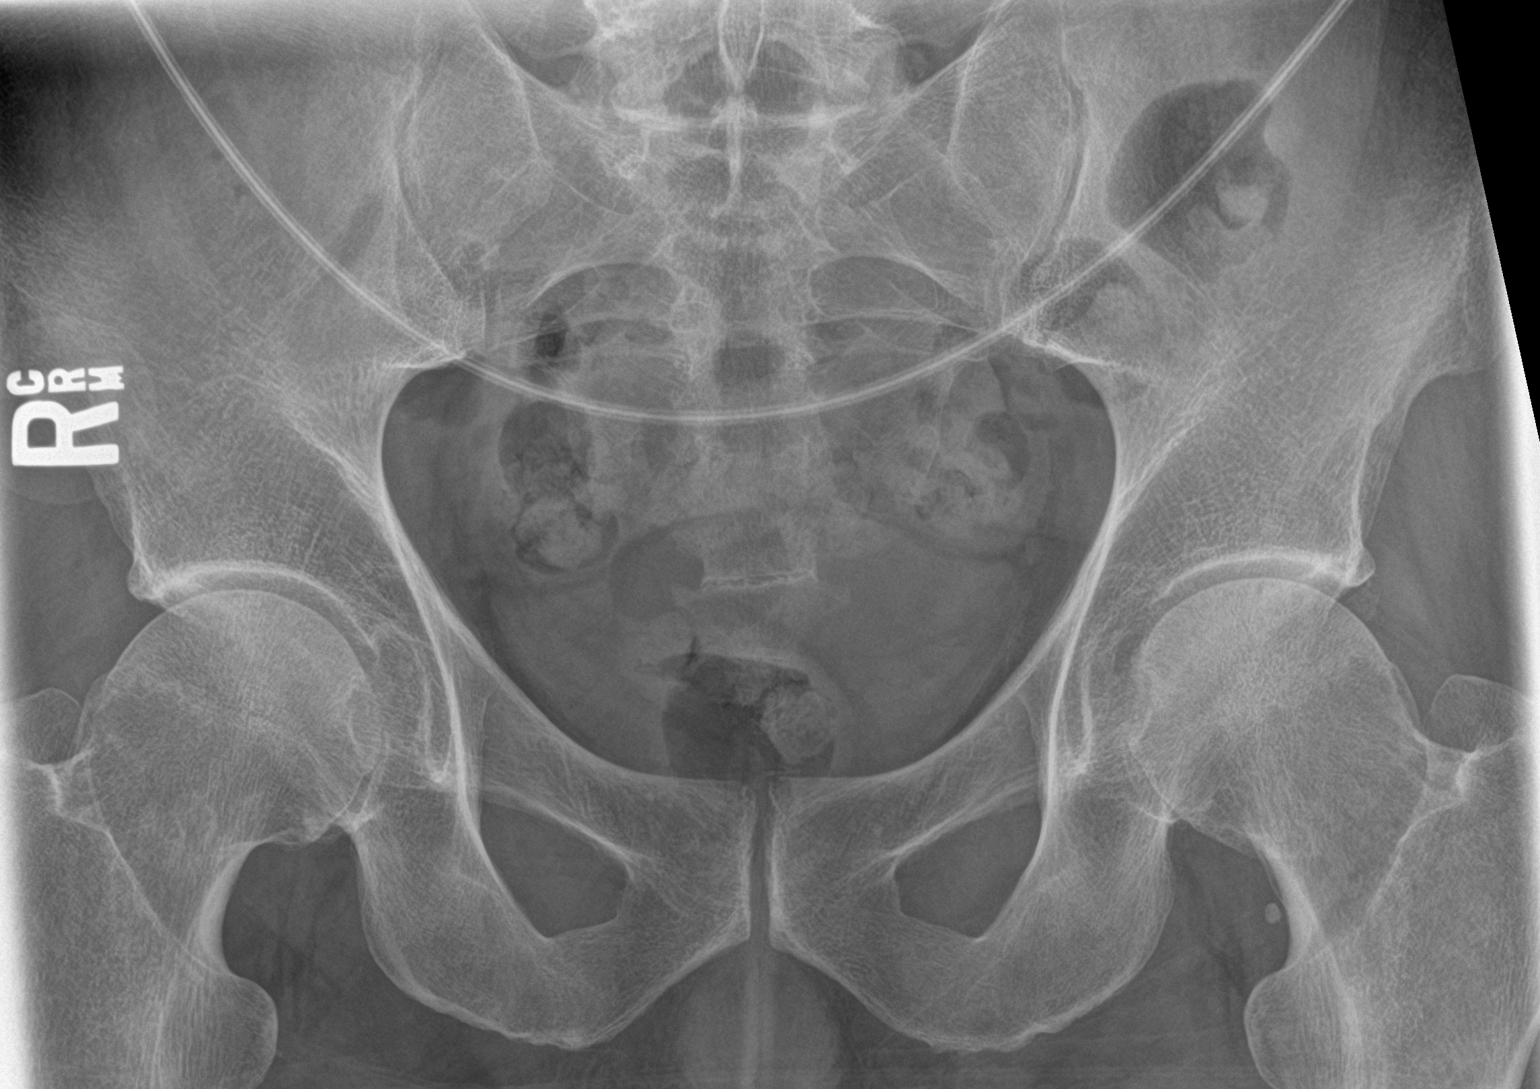

[2 of 2 positions shown; findings below may reference images not displayed]

FINDINGS: Both large and small bowel gas is present without distension. Air is
seen to the rectum. No opaque calculi are noted. There are
degenerative changes present in the lower lumbar spine.
IMPRESSION: 1. Nonspecific bowel gas pattern.  No bowel obstruction.
2. No opaque calculi are noted.

## 2019-06-27 IMAGING — US US ABDOMEN LIMITED
1 series · 14 of 25 positions shown · non-contrast
Comparison: Abdominal CT dated 02/11/2016

CLINICAL DATA: 51-year-old male with abdominal pain.

EXAM:
ULTRASOUND ABDOMEN LIMITED RIGHT UPPER QUADRANT

[Series 1: us abdomen limited · 0.28mm/px · 14 of 45 slices shown]
[im 1/45]
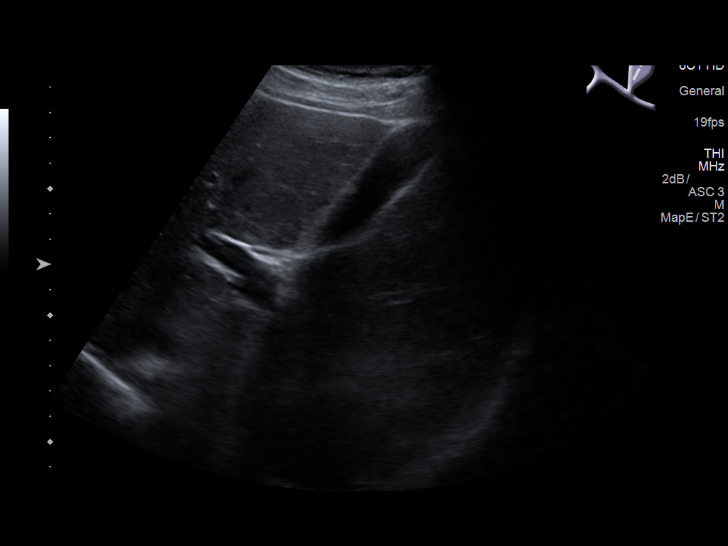
[im 4/45]
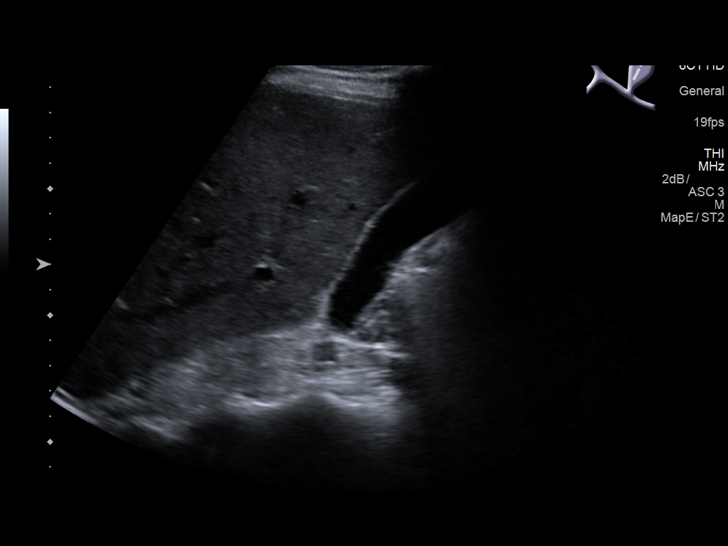
[im 8/45]
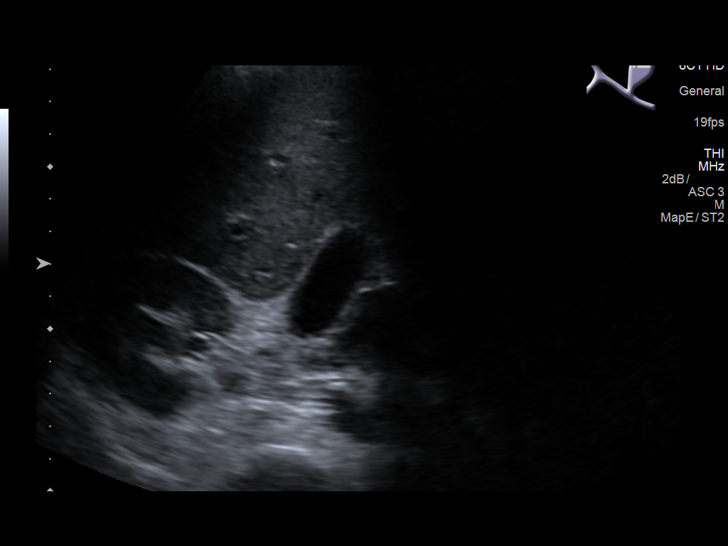
[im 12/45]
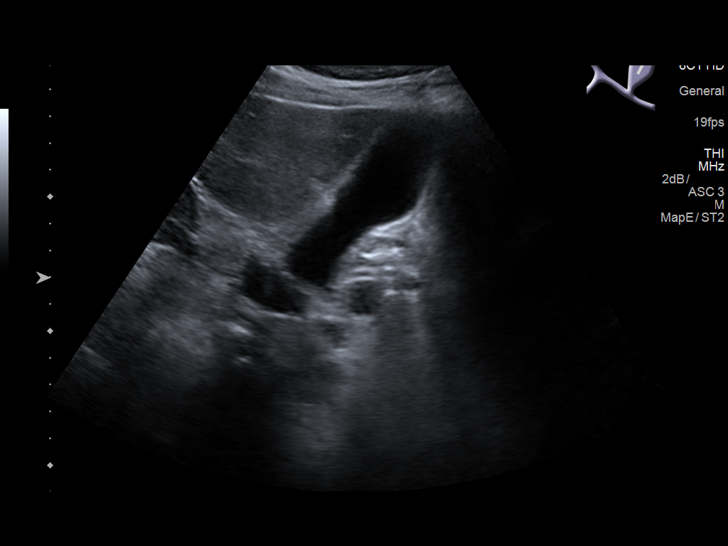
[im 15/45]
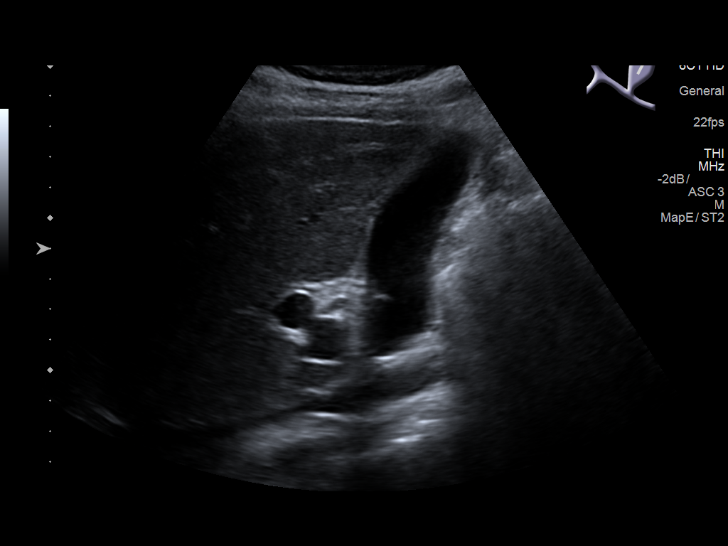
[im 17/45]
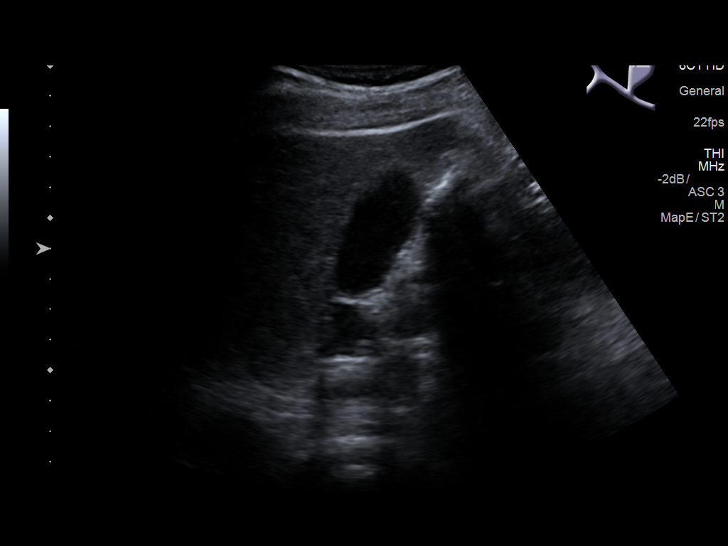
[im 21/45]
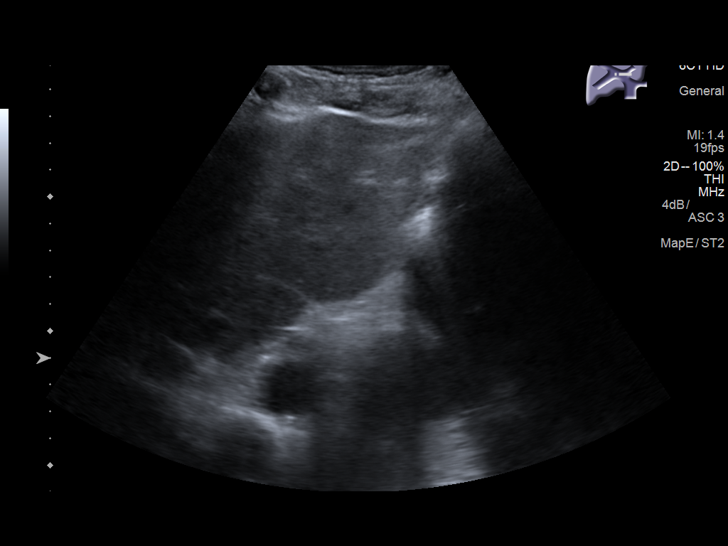
[im 24/45]
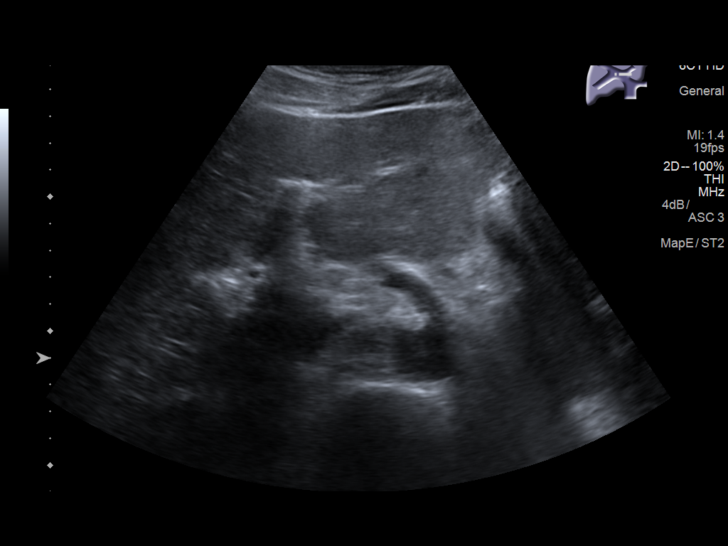
[im 28/45]
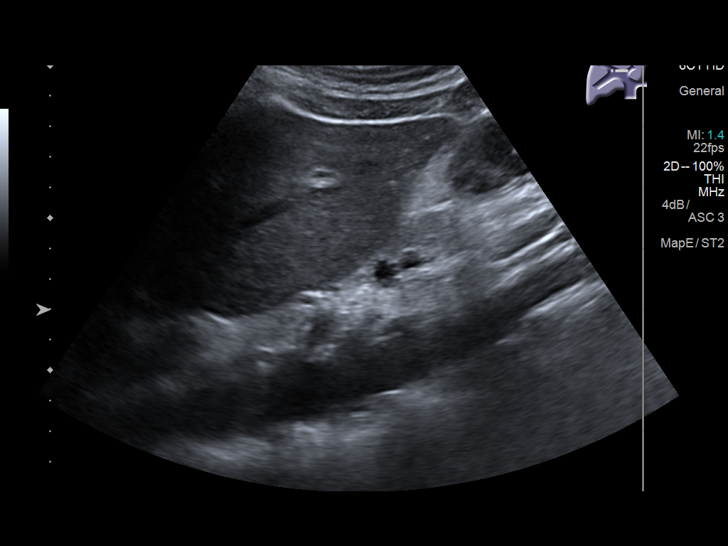
[im 30/45]
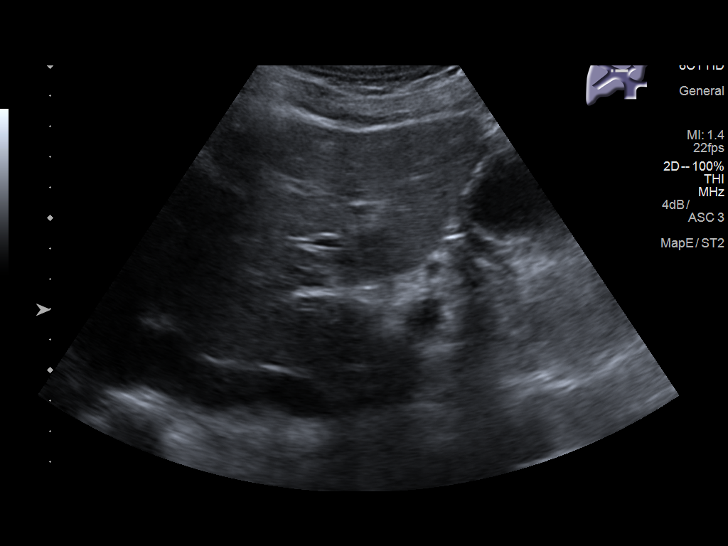
[im 34/45]
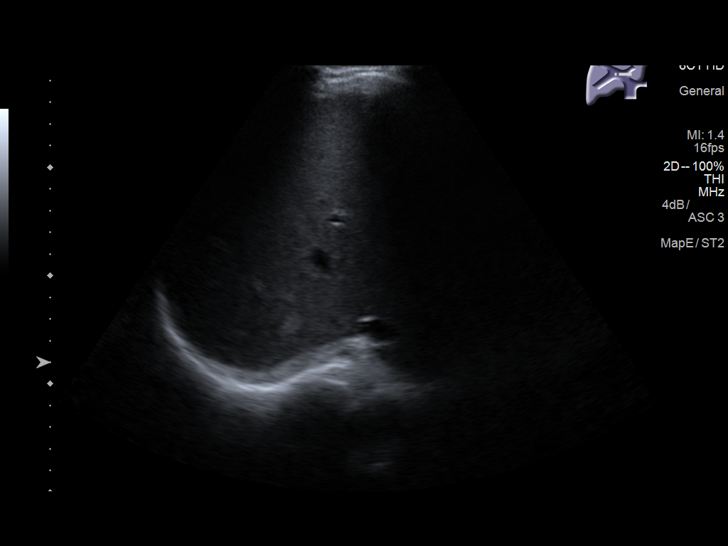
[im 37/45]
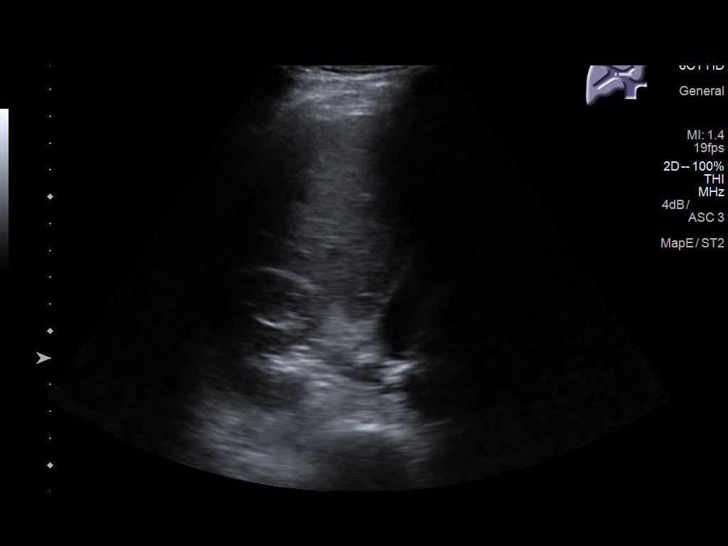
[im 41/45]
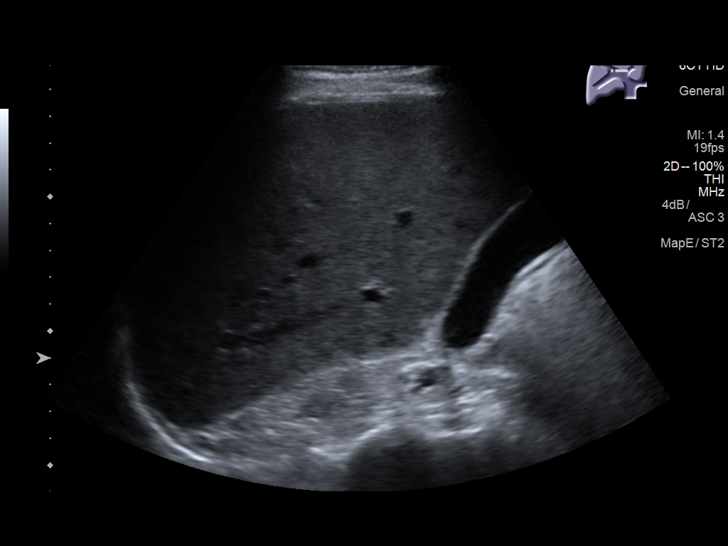
[im 45/45]
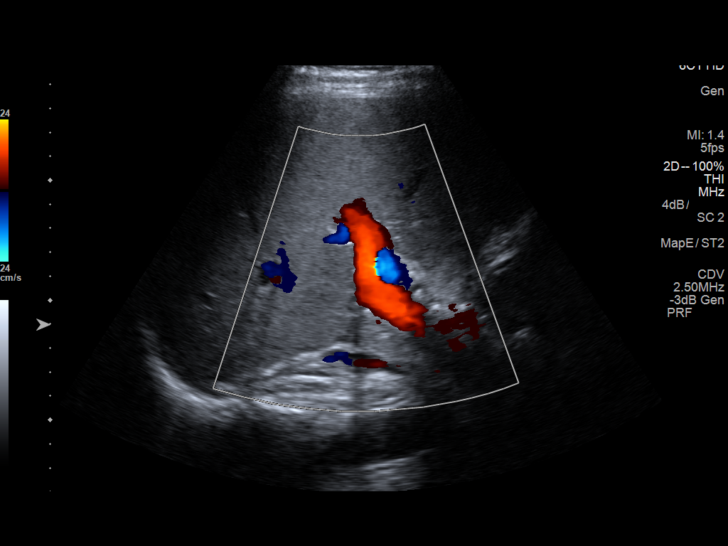

[14 of 25 positions shown; findings below may reference images not displayed]

FINDINGS: Gallbladder:

No gallstones or wall thickening visualized. No sonographic Murphy
sign noted by sonographer.

Common bile duct:

Diameter: 4 mm

Liver:

There is a 1.1 x 1.0 x 1.2 cm hypoechoic lesion in the right lobe of
the liver which is not well characterized but may represent a
hemangioma. Portal vein is patent on color Doppler imaging with
normal direction of blood flow towards the liver.
IMPRESSION: 1. Unremarkable gallbladder.
2. Small right hepatic lobe echogenic lesion, indeterminate,
possibly a hemangioma.
